# Patient Record
Sex: Female | Born: 1945
Health system: Southern US, Community
[De-identification: ages and names within clinical notes are randomized; demographics above are authoritative.]

## PROBLEM LIST (undated history)

## (undated) DIAGNOSIS — G43909 Migraine, unspecified, not intractable, without status migrainosus: Secondary | ICD-10-CM

## (undated) DIAGNOSIS — Z9889 Other specified postprocedural states: Secondary | ICD-10-CM

## (undated) DIAGNOSIS — R519 Headache, unspecified: Secondary | ICD-10-CM

## (undated) DIAGNOSIS — Z8719 Personal history of other diseases of the digestive system: Secondary | ICD-10-CM

## (undated) DIAGNOSIS — F1190 Opioid use, unspecified, uncomplicated: Secondary | ICD-10-CM

## (undated) DIAGNOSIS — R112 Nausea with vomiting, unspecified: Secondary | ICD-10-CM

## (undated) DIAGNOSIS — I10 Essential (primary) hypertension: Secondary | ICD-10-CM

## (undated) DIAGNOSIS — K219 Gastro-esophageal reflux disease without esophagitis: Secondary | ICD-10-CM

## (undated) DIAGNOSIS — M797 Fibromyalgia: Secondary | ICD-10-CM

## (undated) DIAGNOSIS — F329 Major depressive disorder, single episode, unspecified: Secondary | ICD-10-CM

## (undated) DIAGNOSIS — M810 Age-related osteoporosis without current pathological fracture: Secondary | ICD-10-CM

## (undated) DIAGNOSIS — Z8489 Family history of other specified conditions: Secondary | ICD-10-CM

## (undated) DIAGNOSIS — F419 Anxiety disorder, unspecified: Secondary | ICD-10-CM

## (undated) DIAGNOSIS — F32A Depression, unspecified: Secondary | ICD-10-CM

## (undated) DIAGNOSIS — J45909 Unspecified asthma, uncomplicated: Secondary | ICD-10-CM

## (undated) DIAGNOSIS — R51 Headache: Secondary | ICD-10-CM

## (undated) DIAGNOSIS — J449 Chronic obstructive pulmonary disease, unspecified: Secondary | ICD-10-CM

## (undated) DIAGNOSIS — R0602 Shortness of breath: Secondary | ICD-10-CM

## (undated) HISTORY — DX: Opioid use, unspecified, uncomplicated: F11.90

## (undated) HISTORY — DX: Chronic obstructive pulmonary disease, unspecified: J44.9

---
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
## 1970-05-03 HISTORY — PX: TUBAL LIGATION: SHX77

## 1973-05-03 HISTORY — PX: APPENDECTOMY: SHX54

## 1973-05-03 HISTORY — PX: ABDOMINAL HYSTERECTOMY: SHX81

## 1979-01-02 HISTORY — PX: EXCISIONAL HEMORRHOIDECTOMY: SHX1541

## 1979-01-02 HISTORY — PX: BREAST CYST ASPIRATION: SHX578

## 2006-03-10 ENCOUNTER — Encounter: Payer: Self-pay | Admitting: Family Medicine

## 2006-03-10 ENCOUNTER — Ambulatory Visit: Payer: Self-pay | Admitting: Family Medicine

## 2006-03-10 DIAGNOSIS — M3320 Polymyositis, organ involvement unspecified: Secondary | ICD-10-CM | POA: Insufficient documentation

## 2006-03-10 DIAGNOSIS — Z87891 Personal history of nicotine dependence: Secondary | ICD-10-CM | POA: Insufficient documentation

## 2006-03-10 DIAGNOSIS — F17200 Nicotine dependence, unspecified, uncomplicated: Secondary | ICD-10-CM | POA: Insufficient documentation

## 2006-03-10 DIAGNOSIS — M810 Age-related osteoporosis without current pathological fracture: Secondary | ICD-10-CM | POA: Insufficient documentation

## 2006-03-10 DIAGNOSIS — M797 Fibromyalgia: Secondary | ICD-10-CM | POA: Insufficient documentation

## 2006-03-10 DIAGNOSIS — F411 Generalized anxiety disorder: Secondary | ICD-10-CM | POA: Insufficient documentation

## 2006-03-10 DIAGNOSIS — I10 Essential (primary) hypertension: Secondary | ICD-10-CM | POA: Insufficient documentation

## 2006-08-29 ENCOUNTER — Encounter: Payer: Self-pay | Admitting: Family Medicine

## 2006-09-22 ENCOUNTER — Ambulatory Visit: Payer: Self-pay | Admitting: Family Medicine

## 2007-03-21 ENCOUNTER — Ambulatory Visit: Payer: Self-pay | Admitting: Family Medicine

## 2007-03-21 DIAGNOSIS — G4700 Insomnia, unspecified: Secondary | ICD-10-CM | POA: Insufficient documentation

## 2007-03-23 LAB — CONVERTED CEMR LAB
ALT: 26 units/L (ref 0–35)
AST: 23 units/L (ref 0–37)
Creatinine, Ser: 0.7 mg/dL (ref 0.40–1.20)
Total Bilirubin: 0.6 mg/dL (ref 0.3–1.2)
Vit D, 1,25-Dihydroxy: 32 (ref 30–89)

## 2007-05-05 ENCOUNTER — Encounter: Payer: Self-pay | Admitting: Family Medicine

## 2007-05-19 ENCOUNTER — Ambulatory Visit: Payer: Self-pay | Admitting: *Deleted

## 2007-10-10 ENCOUNTER — Ambulatory Visit: Payer: Self-pay | Admitting: Family Medicine

## 2007-10-10 DIAGNOSIS — K219 Gastro-esophageal reflux disease without esophagitis: Secondary | ICD-10-CM | POA: Insufficient documentation

## 2008-02-13 ENCOUNTER — Ambulatory Visit: Payer: Self-pay | Admitting: Family Medicine

## 2008-02-13 LAB — CONVERTED CEMR LAB
Cholesterol, target level: 200 mg/dL
HDL: 75 mg/dL (ref >39)
LDL Cholesterol: 125 mg/dL — ABNORMAL HIGH (ref 0–99)
Total CHOL/HDL Ratio: 3

## 2008-08-01 ENCOUNTER — Ambulatory Visit: Payer: Self-pay | Admitting: Family Medicine

## 2008-08-02 ENCOUNTER — Encounter: Payer: Self-pay | Admitting: Family Medicine

## 2008-08-12 ENCOUNTER — Ambulatory Visit: Payer: Self-pay | Admitting: Family Medicine

## 2008-09-25 ENCOUNTER — Other Ambulatory Visit: Admission: RE | Admit: 2008-09-25 | Discharge: 2008-09-25 | Payer: Self-pay | Admitting: Family Medicine

## 2008-09-25 ENCOUNTER — Encounter: Payer: Self-pay | Admitting: Family Medicine

## 2008-09-25 ENCOUNTER — Ambulatory Visit: Payer: Self-pay | Admitting: Family Medicine

## 2008-09-25 DIAGNOSIS — H9190 Unspecified hearing loss, unspecified ear: Secondary | ICD-10-CM | POA: Insufficient documentation

## 2008-09-27 LAB — CONVERTED CEMR LAB
Albumin: 4.8 g/dL (ref 3.5–5.2)
Alkaline Phosphatase: 71 units/L (ref 39–117)
BUN: 13 mg/dL (ref 6–23)
CO2: 23 meq/L (ref 19–32)
Calcium: 9.7 mg/dL (ref 8.4–10.5)
Cholesterol: 217 mg/dL — ABNORMAL HIGH (ref 0–200)
Glucose, Bld: 90 mg/dL (ref 70–99)
HDL: 72 mg/dL (ref >39)
LDL Cholesterol: 115 mg/dL — ABNORMAL HIGH (ref 0–99)
Pap Smear: NORMAL
Potassium: 4.7 meq/L (ref 3.5–5.3)
Triglycerides: 151 mg/dL — ABNORMAL HIGH (ref <150)

## 2008-10-15 ENCOUNTER — Encounter: Payer: Self-pay | Admitting: Family Medicine

## 2008-10-21 ENCOUNTER — Encounter: Payer: Self-pay | Admitting: Family Medicine

## 2008-10-22 ENCOUNTER — Encounter: Payer: Self-pay | Admitting: Family Medicine

## 2008-11-25 ENCOUNTER — Ambulatory Visit: Payer: Self-pay | Admitting: Family Medicine

## 2008-11-25 ENCOUNTER — Encounter: Admission: RE | Admit: 2008-11-25 | Discharge: 2008-11-25 | Payer: Self-pay | Admitting: Family Medicine

## 2009-02-13 ENCOUNTER — Ambulatory Visit: Payer: Self-pay | Admitting: Family Medicine

## 2009-02-13 DIAGNOSIS — F329 Major depressive disorder, single episode, unspecified: Secondary | ICD-10-CM | POA: Insufficient documentation

## 2009-02-13 DIAGNOSIS — F32A Depression, unspecified: Secondary | ICD-10-CM | POA: Insufficient documentation

## 2009-02-13 DIAGNOSIS — F330 Major depressive disorder, recurrent, mild: Secondary | ICD-10-CM | POA: Insufficient documentation

## 2009-03-31 ENCOUNTER — Encounter: Payer: Self-pay | Admitting: Family Medicine

## 2009-06-03 ENCOUNTER — Encounter: Payer: Self-pay | Admitting: Family Medicine

## 2009-06-05 ENCOUNTER — Encounter: Payer: Self-pay | Admitting: Family Medicine

## 2009-09-04 ENCOUNTER — Ambulatory Visit: Payer: Self-pay | Admitting: Family Medicine

## 2009-10-03 ENCOUNTER — Encounter: Payer: Self-pay | Admitting: Family Medicine

## 2009-10-03 ENCOUNTER — Ambulatory Visit: Payer: Self-pay | Admitting: Family Medicine

## 2009-10-10 LAB — CONVERTED CEMR LAB
Albumin: 4.8 g/dL (ref 3.5–5.2)
BUN: 16 mg/dL (ref 6–23)
CO2: 25 meq/L (ref 19–32)
Calcium: 9.6 mg/dL (ref 8.4–10.5)
Chloride: 102 meq/L (ref 96–112)
Cholesterol: 205 mg/dL — ABNORMAL HIGH (ref 0–200)
Creatinine, Ser: 0.78 mg/dL (ref 0.40–1.20)
Glucose, Bld: 95 mg/dL (ref 70–99)
HDL: 69 mg/dL (ref >39)
Total CHOL/HDL Ratio: 3
Triglycerides: 128 mg/dL (ref <150)

## 2010-01-16 ENCOUNTER — Ambulatory Visit: Payer: Self-pay | Admitting: Family Medicine

## 2010-05-03 HISTORY — PX: COLONOSCOPY W/ BIOPSIES AND POLYPECTOMY: SHX1376

## 2010-05-21 ENCOUNTER — Ambulatory Visit
Admission: RE | Admit: 2010-05-21 | Discharge: 2010-05-21 | Payer: Self-pay | Source: Home / Self Care | Attending: Family Medicine | Admitting: Family Medicine

## 2010-06-02 NOTE — Procedures (Signed)
Summary: Colonoscopy   Flex Sig Next Due:  Not Indicated Last Colonoscopy:  done (05/04/2007 1:20:44 PM) Colonoscopy Result Date:  06/03/2009 Colonoscopy Result:  normal Colonoscopy Next Due:  10 yr Hemoccult Next Due:  Not Indicated  At [facility] GI with Dr. Marney Setting. February  1, 201112:24 PM Annette Ferger MD, Santina Evans

## 2010-06-02 NOTE — Letter (Signed)
Summary: Depression Questionnaire/[facility]  Depression Questionnaire/[place] [facility]   Imported By: Lanelle Bal 06/09/2009 10:56:10  _____________________________________________________________________  External Attachment:    Type:   Image     Comment:   External Document

## 2010-06-02 NOTE — Assessment & Plan Note (Signed)
Summary: REFILL ON MEDS, cough   Vital Signs:  Patient profile:   65 year old female Height:      62.5 inches Weight:      155 pounds Pulse rate:   64 / minute BP sitting:   132 / 79  (left arm) Cuff size:   regular  Vitals Entered By: Kathlene November (January 16, 2010 9:52 AM) CC: refills. Pt wants printed rx's in hand- doesn't want any sent electronically   Primary Care Provider:  Linford Arnold, C  CC:  refills. Pt wants printed rx's in hand- doesn't want any sent electronically.  History of Present Illness: refills. Pt wants printed rx's in hand- doesn't want any sent electronically. Having problems with her pharmacy so would like them printed out.    Cough since march. Feels like in the back of the throat. Clear mucous occ.  Occ will feel like she is strangling. Smoker but says hasn't smoked lately.  Feels mucous breaking loose from her sinuses but not pain or pressure.  NO fever or sneezing or eye pain or pressure. No ear sxs. NO SOB or CP.  NO worsenign or alleviating sxs.   Current Medications (verified): 1)  Atenolol 100 Mg Tabs (Atenolol) .... Take 1 Tablet By Mouth Two Times A Day 2)  Omeprazole 40 Mg  Cpdr (Omeprazole) .... Take 1 Tablet By Mouth Two Times A Day 3)  Alprazolam 0.25 Mg Tabs (Alprazolam) .... Take 1 Tablet By Mouth Three Times A Day As Needed 4)  Alendronate Sodium 10 Mg  Tabs (Alendronate Sodium) .... Take 1 Tablet By Mouth Once A Day 5)  Multivitamins  Tabs (Multiple Vitamin) .... Take 1 Tablet By Mouth Once A Day 6)  Calcarb 600/d 600-125 Mg-Unit Tabs (Calcium-Vitamin D) .... Take 1 Tablet By Mouth Two Times A Day 7)  Fish Oil 1000 Mg Caps (Omega-3 Fatty Acids) .... Take 1 Tablet By Mouth Once A Day 8)  Seroquel 25 Mg  Tabs (Quetiapine Fumarate) .... Take 1 Tablet By Mouth Once A Day At Bedtime 9)  Vitamin D 1000 Unit Caps (Cholecalciferol) .... Take 1 Tablet By Mouth Once A Day 10)  Pentazocine-Naloxone Hcl 50-0.5 Mg Tabs (Pentazocine-Naloxone) .Marland Kitchen.. 1 Tab By  Mouth Every 6 Hours As Needed 11)  Lyrica 75 Mg Caps (Pregabalin) .... Take 1 Tablet By Mouth Two Times A Day 12)  Celexa 20 Mg Tabs (Citalopram Hydrobromide) .... 1/2 Tab By Mouth Once A Day For 1 Week Increase To Whole Tab A Day. 13)  Lisinopril-Hydrochlorothiazide 20-25 Mg Tabs (Lisinopril-Hydrochlorothiazide) .... Take 1 Tablet By Mouth Once A Day  Allergies (verified): 1)  ! Asa 2)  Codeine 3)  Augmentin (Amoxicillin-Pot Clavulanate)  Comments:  Nurse/Medical Assistant: The patient's medications and allergies were reviewed with the patient and were updated in the Medication and Allergy Lists. Kathlene November (January 16, 2010 9:52 AM)  Social History: Reviewed history from 03/10/2006 and no changes required. Stays at home.   Current Smoker Alcohol use-yes Drug use-no Regular exercise-no  Physical Exam  General:  Well-developed,well-nourished,in no acute distress; alert,appropriate and cooperative throughout examination Head:  Normocephalic and atraumatic without obvious abnormalities. No apparent alopecia or balding. Eyes:  No corneal or conjunctival inflammation noted. EOMI. Perrla. Funduscopic exam benign, without hemorrhages, exudates or papilledema. Vision grossly normal. Ears:  External ear exam shows no significant lesions or deformities.  Otoscopic examination reveals clear canals, tympanic membranes are intact bilaterally without bulging, retraction, inflammation or discharge. Hearing is grossly normal bilaterally. Nose:  External nasal  examination shows no deformity or inflammation.Trubinate are pale and swollen.  Mouth:  Oral mucosa and oropharynx without lesions or exudates.  Teeth in good repair. Neck:  No deformities, masses, or tenderness noted. Lungs:  Normal respiratory effort, chest expands symmetrically. Lungs are clear to auscultation, no crackles or wheezes. Heart:  Normal rate and regular rhythm. S1 and S2 normal without gallop, murmur, click, rub or other  extra sounds. Skin:  no rashes.   Psych:  Cognition and judgment appear intact. Alert and cooperative with normal attention span and concentration. No apparent delusions, illusions, hallucinations   Impression & Recommendations:  Problem # 1:  COUGH (ICD-786.2) Consider ACE cough. will change to ARB. F/U in one mo.  Also having nasal drainage with it so consider allergic rhinitis. Trial of omnaris. Samples given to use for 2 weeks. If helping then let me know Also smoking cessation would be helpful.  If nto better at f/u will get a CXR. also consider spirometry since she is a smoker.   Complete Medication List: 1)  Atenolol 100 Mg Tabs (Atenolol) .... Take 1 tablet by mouth two times a day 2)  Omeprazole 40 Mg Cpdr (Omeprazole) .... Take 1 tablet by mouth two times a day 3)  Alprazolam 0.25 Mg Tabs (Alprazolam) .... Take 1 tablet by mouth three times a day as needed 4)  Alendronate Sodium 10 Mg Tabs (Alendronate sodium) .... Take 1 tablet by mouth once a day 5)  Multivitamins Tabs (Multiple vitamin) .... Take 1 tablet by mouth once a day 6)  Calcarb 600/d 600-125 Mg-unit Tabs (Calcium-vitamin d) .... Take 1 tablet by mouth two times a day 7)  Fish Oil 1000 Mg Caps (Omega-3 fatty acids) .... Take 1 tablet by mouth once a day 8)  Seroquel 25 Mg Tabs (Quetiapine fumarate) .... Take 1 tablet by mouth once a day at bedtime 9)  Vitamin D 1000 Unit Caps (Cholecalciferol) .... Take 1 tablet by mouth once a day 10)  Pentazocine-naloxone Hcl 50-0.5 Mg Tabs (Pentazocine-naloxone) .Marland Kitchen.. 1 tab by mouth every 6 hours as needed 11)  Lyrica 75 Mg Caps (Pregabalin) .... Take 1 tablet by mouth two times a day 12)  Celexa 20 Mg Tabs (Citalopram hydrobromide) .... 1/2 tab by mouth once a day for 1 week increase to whole tab a day. 13)  Losartan Potassium-hctz 100-25 Mg Tabs (Losartan potassium-hctz) .... Take 1 tablet by mouth once a day  Patient Instructions: 1)  Please schedule a follow-up appointment in 1  month for cough if not resolved.  Prescriptions: CELEXA 20 MG TABS (CITALOPRAM HYDROBROMIDE) 1/2 tab by mouth once a day for 1 week increase to whole tab a day.  #90 Tablet x 3   Entered and Authorized by:   Nani Gasser MD   Signed by:   Nani Gasser MD on 01/16/2010   Method used:   Print then Give to Patient   RxID:   [id number] LYRICA 75 MG CAPS (PREGABALIN) Take 1 tablet by mouth two times a day  #180 x 3   Entered and Authorized by:   Nani Gasser MD   Signed by:   Nani Gasser MD on 01/16/2010   Method used:   Print then Give to Patient   RxID:   [id number] SEROQUEL 25 MG  TABS (QUETIAPINE FUMARATE) Take 1 tablet by mouth once a day at bedtime  #90 x 3   Entered and Authorized by:   Nani Gasser MD   Signed by:   Nani Gasser  MD on 01/16/2010   Method used:   Print then Give to Patient   RxID:   [id number] ALENDRONATE SODIUM 10 MG  TABS (ALENDRONATE SODIUM) Take 1 tablet by mouth once a day  #90 x 3   Entered and Authorized by:   Nani Gasser MD   Signed by:   Nani Gasser MD on 01/16/2010   Method used:   Print then Give to Patient   RxID:   [id number] OMEPRAZOLE 40 MG  CPDR (OMEPRAZOLE) Take 1 tablet by mouth two times a day  #180 x 3   Entered and Authorized by:   Nani Gasser MD   Signed by:   Nani Gasser MD on 01/16/2010   Method used:   Print then Give to Patient   RxID:   [id number] ATENOLOL 100 MG TABS (ATENOLOL) Take 1 tablet by mouth two times a day  #180 x 3   Entered and Authorized by:   Nani Gasser MD   Signed by:   Nani Gasser MD on 01/16/2010   Method used:   Print then Give to Patient   RxID:   [id number] LOSARTAN POTASSIUM-HCTZ 100-25 MG TABS (LOSARTAN POTASSIUM-HCTZ) Take 1 tablet by mouth once a day  #90 x 3   Entered and Authorized by:   Nani Gasser MD   Signed by:   Nani Gasser MD on 01/16/2010   Method used:   Print then Give to  Patient   RxID:   [id number]   Appended Document: REFILL ON MEDS, cough    Contraindications/Deferment of Procedures/Staging:    Test/Procedure: FLU VAX    Reason for deferment: patient declined

## 2010-06-02 NOTE — Assessment & Plan Note (Signed)
Summary: BP CHECK//[facility]  Nurse Visit   Vital Signs:  Patient profile:   65 year old female Height:      62.5 inches Pulse rate:   56 / minute BP sitting:   121 / 76  (left arm) Cuff size:   regular  Vitals Entered By: Kathlene November (October 03, 2009 10:53 AM) CC: Follow-up HTN HPI: Taking Meds?yes Side Effects?none Chest Pain, SOB, Dizziness?no A/P: HTN(401.1) At Goal? If no, MD will be notified. Follow-up in--  5 minutes was spent with the pt.  Allergies: 1)  ! Asa 2)  Codeine 3)  Augmentin (Amoxicillin-Pot Clavulanate)  Orders Added: 1)  Est. Patient Level I [34742]

## 2010-06-02 NOTE — Assessment & Plan Note (Signed)
Summary: f/U HTN   Vital Signs:  Patient profile:   65 year old female Height:      62.5 inches Weight:      152 pounds Pulse rate:   69 / minute BP sitting:   139 / 83  (left arm) Cuff size:   regular  Vitals Entered By: Kathlene November (Sep 04, 2009 9:48 AM) CC: followup BP, Hypertension Management   Primary Care Provider:  Linford Arnold, C  CC:  followup BP and Hypertension Management.  History of Present Illness: followup BP, Hypertension Management.    Hypertension History:      She denies headache, chest pain, palpitations, dyspnea with exertion, orthopnea, PND, peripheral edema, visual symptoms, neurologic problems, syncope, and side effects from treatment.  She notes no problems with any antihypertensive medication side effects.        Positive major cardiovascular risk factors include female age 73 years old or older, hypertension, and current tobacco user.  Negative major cardiovascular risk factors include no history of diabetes and negative family history for ischemic heart disease.        Further assessment for target organ damage reveals no history of ASHD, stroke/TIA, or peripheral vascular disease.     Current Medications (verified): 1)  Atenolol 100 Mg Tabs (Atenolol) .... Take 1 Tablet By Mouth Two Times A Day 2)  Omeprazole 40 Mg  Cpdr (Omeprazole) .... Take 1 Tablet By Mouth Two Times A Day 3)  Alprazolam 0.25 Mg Tabs (Alprazolam) .... Take 1 Tablet By Mouth Three Times A Day As Needed 4)  Alendronate Sodium 10 Mg  Tabs (Alendronate Sodium) .... Take 1 Tablet By Mouth Once A Day 5)  Multivitamins  Tabs (Multiple Vitamin) .... Take 1 Tablet By Mouth Once A Day 6)  Calcarb 600/d 600-125 Mg-Unit Tabs (Calcium-Vitamin D) .... Take 1 Tablet By Mouth Two Times A Day 7)  Fish Oil 1000 Mg Caps (Omega-3 Fatty Acids) .... Take 1 Tablet By Mouth Once A Day 8)  Seroquel 25 Mg  Tabs (Quetiapine Fumarate) .... Take 1 Tablet By Mouth Once A Day At Bedtime 9)  Vitamin D 1000 Unit  Caps (Cholecalciferol) .... Take 1 Tablet By Mouth Once A Day 10)  Pentazocine-Naloxone Hcl 50-0.5 Mg Tabs (Pentazocine-Naloxone) .Marland Kitchen.. 1 Tab By Mouth Every 6 Hours As Needed 11)  Hydrochlorothiazide 25 Mg Tabs (Hydrochlorothiazide) .... Take 1 Tablet By Mouth Once A Day 12)  Lyrica 75 Mg Caps (Pregabalin) .... Take 1 Tablet By Mouth Two Times A Day 13)  Celexa 20 Mg Tabs (Citalopram Hydrobromide) .... 1/2 Tab By Mouth Once A Day For 1 Week Increase To Whole Tab A Day. 14)  Zithromax Z-Pak 250 Mg Tabs (Azithromycin) .... Take As Directed.  Allergies (verified): 1)  ! Asa 2)  Codeine 3)  Augmentin (Amoxicillin-Pot Clavulanate)  Comments:  Nurse/Medical Assistant: The patient's medications and allergies were reviewed with the patient and were updated in the Medication and Allergy Lists. Kathlene November (Sep 04, 2009 9:48 AM)  Physical Exam  General:  Well-developed,well-nourished,in no acute distress; alert,appropriate and cooperative throughout examination Lungs:  Normal respiratory effort, chest expands symmetrically. Lungs are clear to auscultation, no crackles or wheezes. Heart:  Normal rate and regular rhythm. S1 and S2 normal without gallop, murmur, click, rub or other extra sounds. No carotid bruits.  Skin:  no rashes.   Psych:  Cognition and judgment appear intact. Alert and cooperative with normal attention span and concentration. No apparent delusions, illusions, hallucinations   Impression &  Recommendations:  Problem # 1:  HYPERTENSION, BENIGN ESSENTIAL (ICD-401.1) Will add lsinopril for better control.  REcheck in 3 months. Come in sooner if BP is down to low or having sxs.  The following medications were removed from the medication list:    Hydrochlorothiazide 25 Mg Tabs (Hydrochlorothiazide) .Marland Kitchen... Take 1 tablet by mouth once a day Her updated medication list for this problem includes:    Atenolol 100 Mg Tabs (Atenolol) .Marland Kitchen... Take 1 tablet by mouth two times a day     Lisinopril-hydrochlorothiazide 20-25 Mg Tabs (Lisinopril-hydrochlorothiazide) .Marland Kitchen... Take 1 tablet by mouth once a day  Orders: T-Comprehensive Metabolic Panel [id number]) T-Lipid Profile ([id number]) Prescription Created Electronically [id number])  Complete Medication List: 1)  Atenolol 100 Mg Tabs (Atenolol) .... Take 1 tablet by mouth two times a day 2)  Omeprazole 40 Mg Cpdr (Omeprazole) .... Take 1 tablet by mouth two times a day 3)  Alprazolam 0.25 Mg Tabs (Alprazolam) .... Take 1 tablet by mouth three times a day as needed 4)  Alendronate Sodium 10 Mg Tabs (Alendronate sodium) .... Take 1 tablet by mouth once a day 5)  Multivitamins Tabs (Multiple vitamin) .... Take 1 tablet by mouth once a day 6)  Calcarb 600/d 600-125 Mg-unit Tabs (Calcium-vitamin d) .... Take 1 tablet by mouth two times a day 7)  Fish Oil 1000 Mg Caps (Omega-3 fatty acids) .... Take 1 tablet by mouth once a day 8)  Seroquel 25 Mg Tabs (Quetiapine fumarate) .... Take 1 tablet by mouth once a day at bedtime 9)  Vitamin D 1000 Unit Caps (Cholecalciferol) .... Take 1 tablet by mouth once a day 10)  Pentazocine-naloxone Hcl 50-0.5 Mg Tabs (Pentazocine-naloxone) .Marland Kitchen.. 1 tab by mouth every 6 hours as needed 11)  Lyrica 75 Mg Caps (Pregabalin) .... Take 1 tablet by mouth two times a day 12)  Celexa 20 Mg Tabs (Citalopram hydrobromide) .... 1/2 tab by mouth once a day for 1 week increase to whole tab a day. 13)  Lisinopril-hydrochlorothiazide 20-25 Mg Tabs (Lisinopril-hydrochlorothiazide) .... Take 1 tablet by mouth once a day  Hypertension Assessment/Plan:      The patient's hypertensive risk group is category B: At least one risk factor (excluding diabetes) with no target organ damage.  Her calculated 10 year risk of coronary heart disease is 9 %.  Today's blood pressure is 139/83.  Her blood pressure goal is < 140/90.  Patient Instructions: 1)  Changed BP med.  New rx sent to your pharmacy 2)  We will call you with your  lab results.  3)  Please schedule a follow-up appointment in 3 months .  Prescriptions: LISINOPRIL-HYDROCHLOROTHIAZIDE 20-25 MG TABS (LISINOPRIL-HYDROCHLOROTHIAZIDE) Take 1 tablet by mouth once a day  #30 x 3   Entered and Authorized by:   Nani Gasser MD   Signed by:   Nani Gasser MD on 09/04/2009   Method used:   Electronically to        [facility]* (retail)       [facility] [place], [place]  [address]       Ph: [phone]       Fax: [phone]   RxID:   [id number]

## 2010-06-02 NOTE — Procedures (Signed)
Summary: Colonoscopy/[facility]  Colonoscopy/[facility]   Imported By: Lanelle Bal 06/13/2009 11:43:11  _____________________________________________________________________  External Attachment:    Type:   Image     Comment:   External Document

## 2010-06-02 NOTE — Letter (Signed)
Summary: Letter to Patient Regarding Colonoscopy Results/[facility]  Letter to Patient Regarding Colonoscopy Results/[facility]   Imported By: Lanelle Bal 06/26/2009 13:46:00  _____________________________________________________________________  External Attachment:    Type:   Image     Comment:   External Document

## 2010-06-04 NOTE — Assessment & Plan Note (Signed)
Summary: Sinusitis/bronchitis   Vital Signs:  Patient profile:   65 year old female Height:      62.5 inches Weight:      157 pounds Pulse rate:   98 / minute BP sitting:   140 / 84  (right arm) Cuff size:   regular  Vitals Entered By: Avon Gully CMA, Duncan Dull) (May 21, 2010 10:39 AM) CC: cough since the end of Dec, hurts throat at times when coughing,pt wants paper rx   Primary Care Provider:  Linford Arnold, C  CC:  cough since the end of Dec, hurts throat at times when coughing, and pt wants paper rx.  History of Present Illness: cough since the end of Dec, hurts throat at times when coughing,pt wants paper rx Cough for 3 weeks, feels tight, in the neck area. No deep.  Sounds wet.  Wheezing at night. Having sinus HA for days at a time and post nasal drip. No fever.  NO ear pain or ST. Felt very nauseated over the weekend. No othe rGI sxs.   Current Medications (verified): 1)  Atenolol 100 Mg Tabs (Atenolol) .... Take 1 Tablet By Mouth Two Times A Day 2)  Omeprazole 40 Mg  Cpdr (Omeprazole) .... Take 1 Tablet By Mouth Two Times A Day 3)  Alprazolam 0.25 Mg Tabs (Alprazolam) .... Take 1 Tablet By Mouth Three Times A Day As Needed 4)  Alendronate Sodium 10 Mg  Tabs (Alendronate Sodium) .... Take 1 Tablet By Mouth Once A Day 5)  Multivitamins  Tabs (Multiple Vitamin) .... Take 1 Tablet By Mouth Once A Day 6)  Calcarb 600/d 600-125 Mg-Unit Tabs (Calcium-Vitamin D) .... Take 1 Tablet By Mouth Two Times A Day 7)  Fish Oil 1000 Mg Caps (Omega-3 Fatty Acids) .... Take 1 Tablet By Mouth Once A Day 8)  Seroquel 25 Mg  Tabs (Quetiapine Fumarate) .... Take 1 Tablet By Mouth Once A Day At Bedtime 9)  Vitamin D 1000 Unit Caps (Cholecalciferol) .... Take 1 Tablet By Mouth Once A Day 10)  Pentazocine-Naloxone Hcl 50-0.5 Mg Tabs (Pentazocine-Naloxone) .Marland Kitchen.. 1 Tab By Mouth Every 6 Hours As Needed 11)  Lyrica 75 Mg Caps (Pregabalin) .... Take 1 Tablet By Mouth Two Times A Day 12)  Celexa 20 Mg  Tabs (Citalopram Hydrobromide) .... 1/2 Tab By Mouth Once A Day For 1 Week Increase To Whole Tab A Day. 13)  Losartan Potassium-Hctz 100-25 Mg Tabs (Losartan Potassium-Hctz) .... Take 1 Tablet By Mouth Once A Day  Allergies (verified): 1)  ! Asa 2)  Codeine 3)  Augmentin (Amoxicillin-Pot Clavulanate)  Comments:  Nurse/Medical Assistant: The patient's medications and allergies were reviewed with the patient and were updated in the Medication and Allergy Lists. Avon Gully CMA, Duncan Dull) (May 21, 2010 10:39 AM)  Social History: Reviewed history from 03/10/2006 and no changes required. Stays at home.   Current Smoker Alcohol use-yes Drug use-no Regular exercise-no  Physical Exam  General:  Well-developed,well-nourished,in no acute distress; alert,appropriate and cooperative throughout examination Head:  Normocephalic and atraumatic without obvious abnormalities. No apparent alopecia or balding. Eyes:  No corneal or conjunctival inflammation noted. EOMI. Perrla. Ears:  External ear exam shows no significant lesions or deformities.  Otoscopic examination reveals clear canals, tympanic membranes are intact bilaterally without bulging, retraction, inflammation or discharge. Hearing is grossly normal bilaterally. Nose:  External nasal examination shows no deformity or inflammation. Mouth:  Oral mucosa and oropharynx without lesions or exudates.  Teeth in good repair. Neck:  No deformities, masses,  or tenderness noted. Lungs:  Normal respiratory effort, chest expands symmetrically. Lungs are clear to auscultation, no crackles or wheezes. Heart:  Normal rate and regular rhythm. S1 and S2 normal without gallop, murmur, click, rub or other extra sounds. Skin:  no rashes.   Cervical Nodes:  No lymphadenopathy noted Psych:  Cognition and judgment appear intact. Alert and cooperative with normal attention span and concentration. No apparent delusions, illusions,  hallucinations   Impression & Recommendations:  Problem # 1:  SINUSITIS - ACUTE-NOS (ICD-461.9)  Sinsutisi/bronchitis.  Discussed coule be viral as we are seeing a similar illness but has been 3 week and not improving and havin sinus sxs so will treat for sinusitis. Cough may still linger for 1-2 weeks. Call if not some better in one week. Can use nasal saline.  She says she is no longer smoking.    Her updated medication list for this problem includes:    Bactrim Ds 800-160 Mg Tabs (Sulfamethoxazole-trimethoprim) .Marland Kitchen... Take 1 tablet by mouth two times a day for 10 days  Complete Medication List: 1)  Atenolol 100 Mg Tabs (Atenolol) .... Take 1 tablet by mouth two times a day 2)  Omeprazole 40 Mg Cpdr (Omeprazole) .... Take 1 tablet by mouth two times a day 3)  Alprazolam 0.25 Mg Tabs (Alprazolam) .... Take 1 tablet by mouth three times a day as needed 4)  Alendronate Sodium 10 Mg Tabs (Alendronate sodium) .... Take 1 tablet by mouth once a day 5)  Multivitamins Tabs (Multiple vitamin) .... Take 1 tablet by mouth once a day 6)  Calcarb 600/d 600-125 Mg-unit Tabs (Calcium-vitamin d) .... Take 1 tablet by mouth two times a day 7)  Fish Oil 1000 Mg Caps (Omega-3 fatty acids) .... Take 1 tablet by mouth once a day 8)  Seroquel 25 Mg Tabs (Quetiapine fumarate) .... Take 1 tablet by mouth once a day at bedtime 9)  Vitamin D 1000 Unit Caps (Cholecalciferol) .... Take 1 tablet by mouth once a day 10)  Pentazocine-naloxone Hcl 50-0.5 Mg Tabs (Pentazocine-naloxone) .Marland Kitchen.. 1 tab by mouth every 6 hours as needed 11)  Lyrica 75 Mg Caps (Pregabalin) .... Take 1 tablet by mouth two times a day 12)  Citalopram Hydrobromide 20 Mg Tabs (Citalopram hydrobromide) .... Take 1 tablet by mouth once a day 13)  Losartan Potassium-hctz 100-25 Mg Tabs (Losartan potassium-hctz) .... Take 1 tablet by mouth once a day 14)  Bactrim Ds 800-160 Mg Tabs (Sulfamethoxazole-trimethoprim) .... Take 1 tablet by mouth two times a  day for 10 days  Patient Instructions: 1)  Call if not better in one week.  2)  Please schedule a follow-up appointment in 1 month to recheck your blood pressure.  Prescriptions: ALPRAZOLAM 0.25 MG TABS (ALPRAZOLAM) Take 1 tablet by mouth three times a day as needed  #90 x 0   Entered and Authorized by:   Nani Gasser MD   Signed by:   Nani Gasser MD on 05/21/2010   Method used:   Print then Give to Patient   RxID:   [id number] CITALOPRAM HYDROBROMIDE 20 MG TABS (CITALOPRAM HYDROBROMIDE) Take 1 tablet by mouth once a day  #90 x 1   Entered and Authorized by:   Nani Gasser MD   Signed by:   Nani Gasser MD on 05/21/2010   Method used:   Print then Give to Patient   RxID:   [id number] LOSARTAN POTASSIUM-HCTZ 100-25 MG TABS (LOSARTAN POTASSIUM-HCTZ) Take 1 tablet by mouth once a day  #90  x 3   Entered and Authorized by:   Nani Gasser MD   Signed by:   Nani Gasser MD on 05/21/2010   Method used:   Print then Give to Patient   RxID:   [id number] LYRICA 75 MG CAPS (PREGABALIN) Take 1 tablet by mouth two times a day  #180 x 3   Entered and Authorized by:   Nani Gasser MD   Signed by:   Nani Gasser MD on 05/21/2010   Method used:   Print then Give to Patient   RxID:   [id number] PENTAZOCINE-NALOXONE HCL 50-0.5 MG TABS (PENTAZOCINE-NALOXONE) 1 tab by mouth every 6 hours as needed  #60 x 1   Entered and Authorized by:   Nani Gasser MD   Signed by:   Nani Gasser MD on 05/21/2010   Method used:   Print then Give to Patient   RxID:   [id number] SEROQUEL 25 MG  TABS (QUETIAPINE FUMARATE) Take 1 tablet by mouth once a day at bedtime  #90 x 3   Entered and Authorized by:   Nani Gasser MD   Signed by:   Nani Gasser MD on 05/21/2010   Method used:   Print then Give to Patient   RxID:   [id number] ALENDRONATE SODIUM 10 MG  TABS (ALENDRONATE SODIUM) Take 1 tablet by mouth once a day   #90 x 3   Entered and Authorized by:   Nani Gasser MD   Signed by:   Nani Gasser MD on 05/21/2010   Method used:   Print then Give to Patient   RxID:   [id number] OMEPRAZOLE 40 MG  CPDR (OMEPRAZOLE) Take 1 tablet by mouth two times a day  #180 x 3   Entered and Authorized by:   Nani Gasser MD   Signed by:   Nani Gasser MD on 05/21/2010   Method used:   Print then Give to Patient   RxID:   [id number] ATENOLOL 100 MG TABS (ATENOLOL) Take 1 tablet by mouth two times a day  #180 x 3   Entered and Authorized by:   Nani Gasser MD   Signed by:   Nani Gasser MD on 05/21/2010   Method used:   Print then Give to Patient   RxID:   [id number] BACTRIM DS 800-160 MG TABS (SULFAMETHOXAZOLE-TRIMETHOPRIM) Take 1 tablet by mouth two times a day for 10 days  #20 x 0   Entered and Authorized by:   Nani Gasser MD   Signed by:   Nani Gasser MD on 05/21/2010   Method used:   Electronically to        [facility] (retail)       [address] [place], [place]  [address]       Ph: [phone]       Fax: [phone]   RxID:   [id number]    Orders Added: 1)  Est. Patient Level III [17510]    Prevention & Chronic Care Immunizations   Influenza vaccine: Historical  (02/01/2008)   Influenza vaccine deferral: patient declined  (01/16/2010)   Influenza vaccine due: 01/31/2009    Tetanus booster: 05/03/2004: Tdap (State)   Tetanus booster due: 05/03/2014    Pneumococcal vaccine: Pneumovax  (09/25/2008)   Pneumococcal vaccine due: None    H. zoster vaccine: Not documented  Colorectal Screening   Hemoccult: Not documented   Hemoccult due: Not Indicated    Colonoscopy: normal  (06/03/2009)   Colonoscopy  due: 06/04/2019  Other Screening   Pap smear: Normal  (09/27/2008)   Pap smear due: 10/2010    Mammogram: Normal  (03/31/2009)   Mammogram due: 04/2010    DXA bone density scan: Not  documented   Smoking status: current  (03/10/2006)   Smoking cessation counseling: yes  (05/19/2007)  Lipids   Total Cholesterol: 205  (10/03/2009)   LDL: 110  (10/03/2009)   LDL Direct: Not documented   HDL: 69  (10/03/2009)   Triglycerides: 128  (10/03/2009)  Hypertension   Last Blood Pressure: 140 / 84  (05/21/2010)   Serum creatinine: 0.78  (10/03/2009)   Serum potassium 4.5  (10/03/2009)  Self-Management Support :    Hypertension self-management support: Not documented

## 2010-07-02 ENCOUNTER — Ambulatory Visit (INDEPENDENT_AMBULATORY_CARE_PROVIDER_SITE_OTHER): Payer: Medicare Other | Admitting: Family Medicine

## 2010-07-02 ENCOUNTER — Encounter: Payer: Self-pay | Admitting: Family Medicine

## 2010-07-02 DIAGNOSIS — J439 Emphysema, unspecified: Secondary | ICD-10-CM | POA: Insufficient documentation

## 2010-07-02 DIAGNOSIS — J449 Chronic obstructive pulmonary disease, unspecified: Secondary | ICD-10-CM | POA: Insufficient documentation

## 2010-07-02 DIAGNOSIS — J019 Acute sinusitis, unspecified: Secondary | ICD-10-CM

## 2010-07-09 NOTE — Assessment & Plan Note (Signed)
Summary: sinusitis, COPD   Vital Signs:  Patient profile:   65 year old female Height:      62.5 inches Weight:      154 pounds O2 Sat:      90 % on Room air Temp:     97.8 degrees F oral Pulse rate:   65 / minute BP sitting:   103 / 67  (right arm) Cuff size:   regular  Vitals Entered By: Avon Gully CMA, (AAMA) (July 02, 2010 1:37 PM)  O2 Flow:  Room air CC: sinus congestion and pressure x 3 weeks, cough, not feeling well   Primary Care Provider:  Linford Arnold, C  CC:  sinus congestion and pressure x 3 weeks, cough, and not feeling well.  History of Present Illness: Seen in mid Jan for sinusitis. Tx with bactrim. She felt much better, about 50%  but still has "something in her throat". Last week started having cough and sinus congestion about 10 days.  Nose has been clear and runny. Has a cuple of fevers this last week as well. Feels like has phlegm stuck in her throat that she really can't clear out.  The wheezing in her throat keeps her up at night.    No formal dx of COPD. Has never had spirometry. Seh has noticed more SOB going up hills.   Current Medications (verified): 1)  Atenolol 100 Mg Tabs (Atenolol) .... Take 1 Tablet By Mouth Two Times A Day 2)  Omeprazole 40 Mg  Cpdr (Omeprazole) .... Take 1 Tablet By Mouth Two Times A Day 3)  Alprazolam 0.25 Mg Tabs (Alprazolam) .... Take 1 Tablet By Mouth Three Times A Day As Needed 4)  Alendronate Sodium 10 Mg  Tabs (Alendronate Sodium) .... Take 1 Tablet By Mouth Once A Day 5)  Multivitamins  Tabs (Multiple Vitamin) .... Take 1 Tablet By Mouth Once A Day 6)  Calcarb 600/d 600-125 Mg-Unit Tabs (Calcium-Vitamin D) .... Take 1 Tablet By Mouth Two Times A Day 7)  Fish Oil 1000 Mg Caps (Omega-3 Fatty Acids) .... Take 1 Tablet By Mouth Once A Day 8)  Seroquel 25 Mg  Tabs (Quetiapine Fumarate) .... Take 1 Tablet By Mouth Once A Day At Bedtime 9)  Vitamin D 1000 Unit Caps (Cholecalciferol) .... Take 1 Tablet By Mouth Once A Day 10)   Pentazocine-Naloxone Hcl 50-0.5 Mg Tabs (Pentazocine-Naloxone) .Marland Kitchen.. 1 Tab By Mouth Every 6 Hours As Needed 11)  Lyrica 75 Mg Caps (Pregabalin) .... Take 1 Tablet By Mouth Two Times A Day 12)  Citalopram Hydrobromide 20 Mg Tabs (Citalopram Hydrobromide) .... Take 1 Tablet By Mouth Once A Day 13)  Losartan Potassium-Hctz 100-25 Mg Tabs (Losartan Potassium-Hctz) .... Take 1 Tablet By Mouth Once A Day 14)  Bactrim Ds 800-160 Mg Tabs (Sulfamethoxazole-Trimethoprim) .... Take 1 Tablet By Mouth Two Times A Day For 10 Days  Allergies (verified): 1)  ! Asa 2)  Codeine 3)  Augmentin (Amoxicillin-Pot Clavulanate)  Comments:  Nurse/Medical Assistant: The patient's medications and allergies were reviewed with the patient and were updated in the Medication and Allergy Lists. Avon Gully CMA, Duncan Dull) (July 02, 2010 1:38 PM)  Past History:  Social History: Last updated: 03/10/2006 Stays at home.   Current Smoker Alcohol use-yes Drug use-no Regular exercise-no  Past Medical History: Spirometry 07/2010: FVC 86%, FEV1 48%, ratio is 45 (severe COPD/Stage III by GOLD criteria  Physical Exam  General:  Well-developed,well-nourished,in no acute distress; alert,appropriate and cooperative throughout examination Head:  Normocephalic and atraumatic without obvious abnormalities. No apparent alopecia or balding. Eyes:  No corneal or conjunctival inflammation noted. EOMI. Perrla Ears:  External ear exam shows no significant lesions or deformities.  Otoscopic examination reveals clear canals, tympanic membranes are intact bilaterally without bulging, retraction, inflammation or discharge. Hearing is grossly normal bilaterally. Nose:  External nasal examination shows no deformity or inflammation.  Mouth:  Oral mucosa and oropharynx without lesions or exudates.  Teeth in good repair. Neck:  No deformities, masses, or tenderness noted. Lungs:  normal respiratory effort.  Dec air movment.  Heart:  Normal  rate and regular rhythm. S1 and S2 normal without gallop, murmur, click, rub or other extra sounds. Skin:  no rashes.   Cervical Nodes:  No lymphadenopathy noted Psych:  Cognition and judgment appear intact. Alert and cooperative with normal attention span and concentration. No apparent delusions, illusions, hallucinations   Impression & Recommendations:  Problem # 1:  SINUSITIS - ACUTE-NOS (ICD-461.9)  The following medications were removed from the medication list:    Bactrim Ds 800-160 Mg Tabs (Sulfamethoxazole-trimethoprim) .Marland Kitchen... Take 1 tablet by mouth two times a day for 10 days Her updated medication list for this problem includes:    Levaquin 750 Mg Tabs (Levofloxacin) .Marland Kitchen... Take 1 tablet by mouth once a day for 5 days    Fluticasone Propionate 50 Mcg/act Susp (Fluticasone propionate) .Marland Kitchen... 2 sprays in each nostril daily  Instructed on treatment. Call if symptoms persist or worsen. complete the abx. will add a nasal steroid spray adn recommend use of nasal saline as well. F/U in 2 weeks to make sure resolved.   Problem # 2:  COPD (ICD-496) Assessment: New  New dx based on spirometry done today in teh office. FVC 86%, FEV148%, ratio 45%. She has severe COPD. We discussed the importance of smoking cessation. Will start her on SABA, LABA, ICS. F/u in 2-3 weeks. Asked her to brin her inhalers with her.  Her updated medication list for this problem includes:    Symbicort 80-4.5 Mcg/act Aero (Budesonide-formoterol fumarate) ..... One puff inhaled two times a day    Proair Hfa 108 (90 Base) Mcg/act Aers (Albuterol sulfate) .Marland Kitchen... 2-4 puffs inhaled as needed shortness of breath or wheezing.  Orders: Spirometry (Pre & Post) [phone]) Albuterol Sulfate Sol 1mg  unit dose (J8119)  Complete Medication List: 1)  Atenolol 100 Mg Tabs (Atenolol) .... Take 1 tablet by mouth two times a day 2)  Omeprazole 40 Mg Cpdr (Omeprazole) .... Take 1 tablet by mouth two times a day 3)  Alprazolam 0.25 Mg  Tabs (Alprazolam) .... Take 1 tablet by mouth three times a day as needed 4)  Alendronate Sodium 10 Mg Tabs (Alendronate sodium) .... Take 1 tablet by mouth once a day 5)  Multivitamins Tabs (Multiple vitamin) .... Take 1 tablet by mouth once a day 6)  Calcarb 600/d 600-125 Mg-unit Tabs (Calcium-vitamin d) .... Take 1 tablet by mouth two times a day 7)  Fish Oil 1000 Mg Caps (Omega-3 fatty acids) .... Take 1 tablet by mouth once a day 8)  Seroquel 25 Mg Tabs (Quetiapine fumarate) .... Take 1 tablet by mouth once a day at bedtime 9)  Vitamin D 1000 Unit Caps (Cholecalciferol) .... Take 1 tablet by mouth once a day 10)  Pentazocine-naloxone Hcl 50-0.5 Mg Tabs (Pentazocine-naloxone) .Marland Kitchen.. 1 tab by mouth every 6 hours as needed 11)  Lyrica 75 Mg Caps (Pregabalin) .... Take 1 tablet by mouth two times a day 12)  Citalopram  Hydrobromide 20 Mg Tabs (Citalopram hydrobromide) .... Take 1 tablet by mouth once a day 13)  Losartan Potassium-hctz 100-25 Mg Tabs (Losartan potassium-hctz) .... Take 1 tablet by mouth once a day 14)  Levaquin 750 Mg Tabs (Levofloxacin) .... Take 1 tablet by mouth once a day for 5 days 15)  Symbicort 80-4.5 Mcg/act Aero (Budesonide-formoterol fumarate) .... One puff inhaled two times a day 16)  Proair Hfa 108 (90 Base) Mcg/act Aers (Albuterol sulfate) .... 2-4 puffs inhaled as needed shortness of breath or wheezing. 17)  Fluticasone Propionate 50 Mcg/act Susp (Fluticasone propionate) .... 2 sprays in each nostril daily  Patient Instructions: 1)  Please schedule a follow-up appointment in 2 weeks for sinuses and COPD.  2)  Use saline nasal rinse two times a day 3)  Start the nasal sprar for your sinuses and complete the antibiotic as well.  4)  Have the pharmacist show you how to use your inhaler called symbicort.  Prescriptions: FLUTICASONE PROPIONATE 50 MCG/ACT SUSP (FLUTICASONE PROPIONATE) 2 sprays in each nostril daily  #1 x 0   Entered and Authorized by:   Nani Gasser  MD   Signed by:   Nani Gasser MD on 07/02/2010   Method used:   Electronically to        [facility]* (retail)       [address]       [place], [place]  [address]       Ph: [phone]       Fax: [phone]   RxID:   [id number] PROAIR HFA 108 (90 BASE) MCG/ACT AERS (ALBUTEROL SULFATE) 2-4 puffs inhaled as needed shortness of breath or wheezing.  #1 x 1   Entered and Authorized by:   Nani Gasser MD   Signed by:   Nani Gasser MD on 07/02/2010   Method used:   Electronically to        [facility]* (retail)       [address]       [place], [place]  [address]       Ph: [phone]       Fax: [phone]   RxID:   [id number] SYMBICORT 80-4.5 MCG/ACT AERO (BUDESONIDE-FORMOTEROL FUMARATE) one puff inhaled two times a day  #1 x 6   Entered and Authorized by:   Nani Gasser MD   Signed by:   Nani Gasser MD on 07/02/2010   Method used:   Electronically to        [facility]* (retail)       [address]       [place], [place]  [address]       Ph: [phone]       Fax: [phone]   RxID:   [id number] LEVAQUIN 750 MG TABS (LEVOFLOXACIN) Take 1 tablet by mouth once a day for 5 days  #5 x 0   Entered and Authorized by:   Nani Gasser MD   Signed by:   Nani Gasser MD on 07/02/2010   Method used:   Electronically to        [facility]* (retail)       [address]       [place], [place]  [address]       Ph: [phone]       Fax: [phone]   RxID:   [id number]    Orders Added: 1)  Est. Patient Level IV [75102] 2)  Spirometry (Pre & Post) [94060] 3)  Albuterol Sulfate Sol 1mg  unit dose [Z6109]

## 2010-07-16 ENCOUNTER — Encounter: Payer: Self-pay | Admitting: Family Medicine

## 2010-07-21 NOTE — Letter (Signed)
Summary: Generic Letter  [facility] [place]  [address]   [place], [place] [address]   Phone: [phone]  Fax: [phone]         07/16/2010  In reagards to :  [facility] [address] [place], [place]  [address]   Ms. Hampe is currently on pentazocine/naloxone which is not currently on the formulat. She currently uses this to treat her fibromyalgia and this current regimen works really well for her.  I do not feel teh alternatives on your formulaty including hydrocodone, methadone, and oxycodone, and morphine are not acceptable.  Please consider approving her medication which is working well for her. She understands taht she will have to pay a higher copay.      Sincerely,      Nani Gasser MD

## 2010-08-13 ENCOUNTER — Other Ambulatory Visit: Payer: Self-pay | Admitting: Family Medicine

## 2010-08-13 MED ORDER — PREGABALIN 75 MG PO CAPS: 75.0000 mg | ORAL_CAPSULE | Freq: Two times a day (BID) | ORAL | Status: DC

## 2010-10-06 ENCOUNTER — Other Ambulatory Visit: Payer: Self-pay | Admitting: Family Medicine

## 2010-11-08 ENCOUNTER — Other Ambulatory Visit: Payer: Self-pay | Admitting: Family Medicine

## 2010-11-09 ENCOUNTER — Other Ambulatory Visit: Payer: Self-pay | Admitting: Family Medicine

## 2010-11-10 ENCOUNTER — Other Ambulatory Visit: Payer: Self-pay | Admitting: Family Medicine

## 2010-11-10 NOTE — Telephone Encounter (Signed)
RF requested for alprazolam 0.25 mg. #90/0 refills was given on 11-08-10.  Refill good til 12-09-10. Jarvis Newcomer, LPN Domingo Dimes

## 2010-11-20 ENCOUNTER — Other Ambulatory Visit: Payer: Self-pay | Admitting: Family Medicine

## 2010-11-21 ENCOUNTER — Other Ambulatory Visit: Payer: Self-pay | Admitting: Family Medicine

## 2010-11-21 MED ORDER — PENTAZOCINE-NALOXONE HCL 50-0.5 MG PO TABS: 1.0000 | ORAL_TABLET | Freq: Four times a day (QID) | ORAL | Status: DC | PRN

## 2010-11-21 NOTE — Telephone Encounter (Signed)
REQ for refill for Talwin generic.  Last OV 07-02-10.  Last refill 10-06-10.  #60/0 refills given today.

## 2010-11-21 NOTE — Telephone Encounter (Signed)
Closed encounter °

## 2010-11-23 ENCOUNTER — Other Ambulatory Visit: Payer: Self-pay | Admitting: Family Medicine

## 2010-11-23 NOTE — Telephone Encounter (Signed)
Closed

## 2011-02-02 ENCOUNTER — Other Ambulatory Visit: Payer: Self-pay | Admitting: Family Medicine

## 2011-02-17 ENCOUNTER — Other Ambulatory Visit: Payer: Self-pay | Admitting: Family Medicine

## 2011-02-22 ENCOUNTER — Other Ambulatory Visit: Payer: Self-pay | Admitting: Family Medicine

## 2011-03-09 ENCOUNTER — Other Ambulatory Visit: Payer: Self-pay | Admitting: Family Medicine

## 2011-04-15 ENCOUNTER — Other Ambulatory Visit: Payer: Self-pay | Admitting: Family Medicine

## 2011-04-19 ENCOUNTER — Other Ambulatory Visit: Payer: Self-pay | Admitting: Family Medicine

## 2011-04-30 ENCOUNTER — Other Ambulatory Visit: Payer: Self-pay | Admitting: Family Medicine

## 2011-05-20 ENCOUNTER — Telehealth: Payer: Self-pay | Admitting: *Deleted

## 2011-05-20 MED ORDER — HYDROCODONE-ACETAMINOPHEN 5-325 MG PO TABS: 2.0000 | ORAL_TABLET | Freq: Two times a day (BID) | ORAL | Status: DC | PRN

## 2011-05-20 NOTE — Telephone Encounter (Signed)
New rx sent

## 2011-05-20 NOTE — Telephone Encounter (Signed)
Pentaz/nalox tab 50-0.5 mg is no longer on formulary. Pt is ok with swithcing to hydrocodone/ibuprofen. [facility]

## 2011-05-24 ENCOUNTER — Other Ambulatory Visit: Payer: Self-pay | Admitting: Family Medicine

## 2011-06-03 ENCOUNTER — Ambulatory Visit (INDEPENDENT_AMBULATORY_CARE_PROVIDER_SITE_OTHER): Payer: Medicare Other | Admitting: Family Medicine

## 2011-06-03 VITALS — BP 125/76 | HR 83 | Wt 149.0 lb

## 2011-06-03 DIAGNOSIS — M5430 Sciatica, unspecified side: Secondary | ICD-10-CM

## 2011-06-03 DIAGNOSIS — M76899 Other specified enthesopathies of unspecified lower limb, excluding foot: Secondary | ICD-10-CM

## 2011-06-03 DIAGNOSIS — M7060 Trochanteric bursitis, unspecified hip: Secondary | ICD-10-CM

## 2011-06-03 DIAGNOSIS — M5432 Sciatica, left side: Secondary | ICD-10-CM

## 2011-06-03 DIAGNOSIS — Z1231 Encounter for screening mammogram for malignant neoplasm of breast: Secondary | ICD-10-CM

## 2011-06-03 MED ORDER — KETOROLAC TROMETHAMINE 60 MG/2ML IM SOLN
60.0000 mg | Freq: Once | INTRAMUSCULAR | Status: AC
  Administered 2011-06-03: 60 mg via INTRAMUSCULAR

## 2011-06-03 MED ORDER — CYCLOBENZAPRINE HCL 10 MG PO TABS: 10.0000 mg | ORAL_TABLET | Freq: Every evening | ORAL | Status: AC | PRN

## 2011-06-03 NOTE — Progress Notes (Signed)
  Subjective:    Patient ID: Annette Ramirez, female    DOB: 27-Jul-1945, 66 y.o.   MRN: [id number]  HPI Low back pain for 2 weeks. Thought it was sciatica initially. If turns over in bed or pivots it is really painful in the left buttock and on her left hip.  Pain radiates down her lateral thigh to her shin to the top of her foot.  Has been taking care of her daughter who is bed-ridden and thinks this may have aggravated her back. She does have a prior history of some back pain. She says it has not felt like this before.Marland Kitchen  Hear some popping a few times in her lower thoracic spine. Taking her usually meds - not relieving her pain. Left leg is very sore. She currently takes Lyrica and Norco. Worse with sitting or standing for prolonged periods. Also worse at night in the bed when she tries to turn over. No significant bleeding symptoms.   Review of Systems     Objective:   Physical Exam  Constitutional: She is oriented to person, place, and time. She appears well-developed and well-nourished.  HENT:  Head: Normocephalic and atraumatic.  Musculoskeletal:       Normal lumbar flexion, and extension. She does have discomfort with both ranges. Normal rotation right and left and normal sidebend. She has a positive straight-leg raise on the left. She is tender over the left buttock and mildly over the left SI joint. Hip, knee, ankle strength is 5 over 5 bilaterally. She did have significant pain with flexion of the foot and hip on the left. She is also very tender over the left greater trochanter bursa.  Neurological: She is alert and oriented to person, place, and time.  Psychiatric: She has a normal mood and affect. Her behavior is normal.          Assessment & Plan:  Left sciatica -new problem. discussed treatment with anti-inflammatories. I recommended she take them with food and water to avoid GI upset. In addition will add a muscle relaxer, Flexeril, at bedtime. Hopefully this will help her  sleep since her pain is causing significant discomfort that she is only getting 2-3 hours of rest at night. She was also given a Toradol injection here in the office for acute pain relief. She already has a prescription for Norco. I would like to get her into physical therapy as soon as possible so we will call him for this referral. Followup in 3-4 weeks to make sure that she is improving with conservative therapy.  Trochanteric bursitis, left-new problem. discussed injection for pain relief today. She says she's had these in the past and did well with them. Patient agreed to a repeat injection today. Trochanteric bursa with Injection Procedure Note  Pre-operative Diagnosis: left trochanteric bursitis  Post-operative Diagnosis: same  Indications: Pain relief  Anesthesia: Lidocaine 1% without epinephrine without added sodium bicarbonate  Procedure Details   Verbal consent was obtained for the procedure. The joint was prepped with Betadine. A needle was inserted into the left trochanteric bursa. 9 ml 1% lidocaine and 1 ml of triamcinolone (KENALOG) 40mg /ml was then injected into the joint. The needle was removed and the area cleansed and dressed.  Complications:  None; patient tolerated the procedure well.

## 2011-06-03 NOTE — Patient Instructions (Addendum)
Aleve twice a day . Take with food and water to avoid stomach upset We will call you with the PT referral.  Use the flexeril at bedtime.   Sciatica Sciatica is a weakness and/or changes in sensation (tingling, jolts, hot and cold, numbness) along the path the sciatic nerve travels. Irritation or damage to lumbar nerve roots is often also referred to as lumbar radiculopathy.   Lumbar radiculopathy (Sciatica) is the most common form of this problem. Radiculopathy can occur in any of the nerves coming out of the spinal cord. The problems caused depend on which nerves are involved. The sciatic nerve is the large nerve supplying the branches of nerves going from the hip to the toes. It often causes a numbness or weakness in the skin and/or muscles that the sciatic nerve serves. It also may cause symptoms (problems) of pain, burning, tingling, or electric shock-like feelings in the path of this nerve. This usually comes from injury to the fibers that make up the sciatic nerve. Some of these symptoms are low back pain and/or unpleasant feelings in the following areas:  From the mid-buttock down the back of the leg to the back of the knee.     And/or the outside of the calf and top of the foot.     And/or behind the inner ankle to the sole of the foot.  CAUSES    Herniated or slipped disc. Discs are the little cushions between the bones in the back.     Pressure by the piriformis muscle in the buttock on the sciatic nerve (Piriformis Syndrome).     Misalignment of the bones in the lower back and buttocks (Sacroiliac Joint Derangement).     Narrowing of the spinal canal that puts pressure on or pinches the fibers that make up the sciatic nerve.     A slipped vertebra that is out of line with those above or beneath it.     Abnormality of the nervous system itself so that nerve fibers do not transmit signals properly, especially to feet and calves (neuropathy).     Tumor (this is rare).  Your  caregiver can usually determine the cause of your sciatica and begin the treatment most likely to help you. TREATMENT   Taking over-the-counter painkillers, physical therapy, rest, exercise, spinal manipulation, and injections of anesthetics and/or steroids may be used. Surgery, acupuncture, and Yoga can also be effective. Mind over matter techniques, mental imagery, and changing factors such as your bed, chair, desk height, posture, and activities are other treatments that may be helpful. You and your caregiver can help determine what is best for you. With proper diagnosis, the cause of most sciatica can be identified and removed. Communication and cooperation between your caregiver and you is essential. If you are not successful immediately, do not be discouraged. With time, a proper treatment can be found that will make you comfortable. HOME CARE INSTRUCTIONS    If the pain is coming from a problem in the back, applying ice to that area for 15 to 20 minutes, 3 to 4 times per day while awake, may be helpful. Put the ice in a plastic bag. Place a towel between the bag of ice and your skin.     You may exercise or perform your usual activities if these do not aggravate your pain, or as suggested by your caregiver.     Only take over-the-counter or prescription medicines for pain, discomfort, or fever as directed by your caregiver.  If your caregiver has given you a follow-up appointment, it is very important to keep that appointment. Not keeping the appointment could result in a chronic or permanent injury, pain, and disability. If there is any problem keeping the appointment, you must call back to this facility for assistance.  SEEK IMMEDIATE MEDICAL CARE IF:    You experience loss of control of bowel or bladder.     You have increasing weakness in the trunk, buttocks, or legs.     There is numbness in any areas from the hip down to the toes.     You have difficulty walking or keeping your  balance.     You have any of the above, with fever or forceful vomiting.  Document Released: 04/13/2001 Document Revised: 12/30/2010 Document Reviewed: 12/01/2007 [facility] Patient Information 2012 [place], [place].

## 2011-06-11 ENCOUNTER — Other Ambulatory Visit: Payer: Self-pay | Admitting: *Deleted

## 2011-06-15 ENCOUNTER — Other Ambulatory Visit: Payer: Self-pay | Admitting: *Deleted

## 2011-06-17 ENCOUNTER — Other Ambulatory Visit: Payer: Self-pay | Admitting: Family Medicine

## 2011-07-05 ENCOUNTER — Telehealth: Payer: Self-pay | Admitting: Family Medicine

## 2011-07-05 ENCOUNTER — Ambulatory Visit (INDEPENDENT_AMBULATORY_CARE_PROVIDER_SITE_OTHER): Payer: Medicare Other | Admitting: Family Medicine

## 2011-07-05 ENCOUNTER — Encounter: Payer: Self-pay | Admitting: Family Medicine

## 2011-07-05 VITALS — BP 129/76 | HR 68 | Temp 98.4°F | Ht 62.5 in | Wt 150.0 lb

## 2011-07-05 DIAGNOSIS — J40 Bronchitis, not specified as acute or chronic: Secondary | ICD-10-CM

## 2011-07-05 DIAGNOSIS — J441 Chronic obstructive pulmonary disease with (acute) exacerbation: Secondary | ICD-10-CM

## 2011-07-05 DIAGNOSIS — J329 Chronic sinusitis, unspecified: Secondary | ICD-10-CM

## 2011-07-05 DIAGNOSIS — J449 Chronic obstructive pulmonary disease, unspecified: Secondary | ICD-10-CM

## 2011-07-05 MED ORDER — ALBUTEROL SULFATE HFA 108 (90 BASE) MCG/ACT IN AERS: 2.0000 | INHALATION_SPRAY | Freq: Four times a day (QID) | RESPIRATORY_TRACT | Status: DC | PRN

## 2011-07-05 MED ORDER — PREDNISONE 20 MG PO TABS: ORAL_TABLET | ORAL | Status: DC

## 2011-07-05 MED ORDER — ALBUTEROL SULFATE (2.5 MG/3ML) 0.083% IN NEBU
2.5000 mg | INHALATION_SOLUTION | Freq: Four times a day (QID) | RESPIRATORY_TRACT | Status: DC | PRN
  Administered 2011-07-05: 2.5 mg via RESPIRATORY_TRACT

## 2011-07-05 MED ORDER — SULFAMETHOXAZOLE-TRIMETHOPRIM 800-160 MG PO TABS: 1.0000 | ORAL_TABLET | Freq: Two times a day (BID) | ORAL | Status: AC

## 2011-07-05 NOTE — Patient Instructions (Signed)
Chronic Obstructive Pulmonary Disease Exacerbation Chronic obstructive pulmonary disease (COPD) is a condition that limits airflow. COPD may include chronic bronchitis, pulmonary emphysema, or both. A COPD exacerbation means that your COPD has gotten worse. Without treatment, this can be a life-threatening problem. COPD exacerbation requires immediate medical care. CAUSES  COPD exacerbation can be caused by:  Exposure to smoke.   Exposure to air pollution, chemical fumes, or dust.   Respiratory infections.   Genetics, particularly alpha 1-antitrypsin deficiency.   A condition in which the body's immune system attacks itself (autoimmunity).  SYMPTOMS   Increased coughing.   Increased wheezing.   Increased shortness of breath.   Swelling due to a buildup of fluid (peripheral edema) related to heart strain.   Rapid breathing.   Chest enlargement (barrel chest).   Chest tightness.  DIAGNOSIS  There is no single test that can diagnosis COPD exacerbation. Your history, physical exam, and other tests will help your caregiver make a diagnosis. Tests may include a chest X-ray, pulmonary function tests, spirometry, basic lab tests, and an arterial blood gas test. TREATMENT  Severe problems may require a stay in the hospital. Depending on the cause of your problems, the following may be prescribed:  Antibiotic medicines.   Bronchodilators (inhaled or tablets).   Cortisone medicines (inhaled or tablets).   Supplemental oxygen therapy.   Pulmonary rehabilitation. This is a broad program that may involve exercise, nutrition counseling, breathing techniques, and further education about your condition.  It is important to use good technique with inhaled medicines. Spacer devices may be needed to help improve drug delivery. HOME CARE INSTRUCTIONS   Do not smoke. Quitting smoking is very important to prevent worsening of COPD.   Avoid exposure to all substances that irritate the airway,  especially tobacco smoke.   If prescribed, take your antibiotics as directed. Finish them even if you start to feel better.   Only take over-the-counter or prescription medicines as directed by your caregiver.   Drink enough fluids to keep your urine clear or pale yellow. This can help thin bronchial secretions.   Use a cool mist vaporizer. This makes it easier to clear your chest when you cough.   If you have a home nebulizer and oxygen, continue to use them as directed.   Maintain all necessary vaccinations to prevent infections.   Exercise regularly.   Eat a healthy diet.   Keep all follow-up appointments as directed by your caregiver.  SEEK IMMEDIATE MEDICAL CARE IF:  You have extreme shortness of breath.   You have severe chest pain or blood in your sputum.   You have a high fever, weakness, repeated vomiting, or fainting.   You feel confused.  MAKE SURE YOU:   Understand these instructions.   Will watch your condition.   Will get help right away if you are not doing well or get worse.  Document Released: 02/14/2007 Document Revised: 04/08/2011 Document Reviewed: 12/15/2010 [employer] Patient Information 2012 [employer]. 

## 2011-07-05 NOTE — Telephone Encounter (Signed)
WE can still schedule her for PT for her sciatica since she is not getting any better.

## 2011-07-05 NOTE — Progress Notes (Signed)
  Subjective:    Patient ID: Annette Ramirez, female    DOB: Sep 11, 1945, 66 y.o.   MRN: [id number]  HPI Cough and chest tightness for 5 days. Low grade fever. Cough productive with green dis charge. Also green nasal discharge.  + smoker. No new meds.  Hx of COPD    Review of Systems     Objective:   Physical Exam  Constitutional: She is oriented to person, place, and time. She appears well-developed and well-nourished.  HENT:  Head: Normocephalic and atraumatic.  Right Ear: External ear normal.  Left Ear: External ear normal.  Nose: Nose normal.  Mouth/Throat: Oropharynx is clear and moist.       TMs and canals are clear.   Eyes: Conjunctivae and EOM are normal. Pupils are equal, round, and reactive to light.  Neck: Neck supple. No thyromegaly present.  Cardiovascular: Normal rate, regular rhythm and normal heart sounds.   Pulmonary/Chest: Effort normal and breath sounds normal. She has no wheezes.  Lymphadenopathy:    She has no cervical adenopathy.  Neurological: She is alert and oriented to person, place, and time.  Skin: Skin is warm and dry.  Psychiatric: She has a normal mood and affect.    No tachypnea. She is able to speak without having to pause for breath.      Assessment & Plan:  Sinusitis/bronchitis,competent a COPD exacerbation. I went her on Bactrim one twice a day x10 days. We gave her Depo-Medrol 80 mg IM here in the office today. Also send her for a ten-day course of prednisone. Followup in one week to make sure she is improving. I like to repeat peak flows at that time. I would really like to get baseline PFTs on her so we can keep a better I on what her actual baseline is. She was in the red zone here but she is not tachypneic and she is able to speak without shortness of breath so I did let her go home. She is to call or go to UC if she suddenly gets worse.   I also encouraged her to schedule a Medicare annual wellness exam, so that we can cover her  preventative care and her up-to-date.

## 2011-07-06 NOTE — Telephone Encounter (Signed)
Annette Ramirez can you reschedule her. Order was originally placed but she never went.  Can you call them again and see if they can schedule her for next week.

## 2011-07-06 NOTE — Telephone Encounter (Signed)
Pt states that she would like to try PT but needs to start it next week due to her being out of town this week for a wedding.

## 2011-07-12 ENCOUNTER — Ambulatory Visit (INDEPENDENT_AMBULATORY_CARE_PROVIDER_SITE_OTHER): Payer: Medicare Other | Admitting: Family Medicine

## 2011-07-12 ENCOUNTER — Encounter: Payer: Self-pay | Admitting: Family Medicine

## 2011-07-12 VITALS — BP 136/71 | HR 66 | Ht 62.5 in | Wt 152.0 lb

## 2011-07-12 DIAGNOSIS — J441 Chronic obstructive pulmonary disease with (acute) exacerbation: Secondary | ICD-10-CM

## 2011-07-12 DIAGNOSIS — J209 Acute bronchitis, unspecified: Secondary | ICD-10-CM

## 2011-07-12 MED ORDER — AMBULATORY NON FORMULARY MEDICATION: Status: DC

## 2011-07-12 NOTE — Progress Notes (Signed)
  Subjective:    Patient ID: Annette Ramirez, female    DOB: [birth year], 66 y.o.   MRN: [id number]  HPI  COPD exacerbation f/u - Taking heer Bactrim. Still has a copule of days left.  Finished the steroid today.  Says is much better. Still some chest congestoin in AM but better during the day. Home peak flow is 250.  Wheezing. Back to as needed on the albuturol.  Last used it during the nightime.    Review of Systems     Objective:   Physical Exam  Constitutional: She is oriented to person, place, and time. She appears well-developed and well-nourished.  HENT:  Head: Normocephalic and atraumatic.  Cardiovascular: Normal rate, regular rhythm and normal heart sounds.   Pulmonary/Chest: Effort normal and breath sounds normal.  Neurological: She is alert and oriented to person, place, and time.  Skin: Skin is warm and dry.  Psychiatric: She has a normal mood and affect. Her behavior is normal.          Assessment & Plan:  COPD/bronchitis, acute - Much better. Home peak flow is 250. Discussed need to schedule serology when she is feeling better. I do not have a baseline on her. I suspect she is probably more moderate COPD in which case she needs an agent such as Spiriva for controller. She does tend to get bronchitis couple of times each winter. I encouraged her to continue to use her albuterol as needed and to complete her antibiotic call. If she feels she is getting worse  Come back.   We also discussed the importance of getting she was dancing. She was given a prescription today I recommended some locations in the local area.

## 2011-07-12 NOTE — Patient Instructions (Signed)
Once your are better please schedule spirometry here in the office.

## 2011-07-21 ENCOUNTER — Other Ambulatory Visit: Payer: Self-pay | Admitting: *Deleted

## 2011-07-21 MED ORDER — CITALOPRAM HYDROBROMIDE 20 MG PO TABS: 20.0000 mg | ORAL_TABLET | Freq: Every day | ORAL | Status: DC

## 2011-08-04 ENCOUNTER — Other Ambulatory Visit: Payer: Self-pay | Admitting: Family Medicine

## 2011-08-09 ENCOUNTER — Other Ambulatory Visit: Payer: Self-pay | Admitting: *Deleted

## 2011-08-09 MED ORDER — HYDROCODONE-ACETAMINOPHEN 5-325 MG PO TABS: 2.0000 | ORAL_TABLET | Freq: Two times a day (BID) | ORAL | Status: DC | PRN

## 2011-08-18 ENCOUNTER — Encounter: Payer: Self-pay | Admitting: Family Medicine

## 2011-08-18 ENCOUNTER — Telehealth: Payer: Self-pay | Admitting: *Deleted

## 2011-09-01 ENCOUNTER — Other Ambulatory Visit: Payer: Self-pay | Admitting: Family Medicine

## 2011-09-01 NOTE — Telephone Encounter (Signed)
Denied Norco. Did not fax this to pharm. Not due until 5/8

## 2011-09-06 ENCOUNTER — Other Ambulatory Visit: Payer: Self-pay | Admitting: Family Medicine

## 2011-10-15 ENCOUNTER — Other Ambulatory Visit: Payer: Self-pay | Admitting: Family Medicine

## 2011-11-02 ENCOUNTER — Other Ambulatory Visit: Payer: Self-pay | Admitting: Family Medicine

## 2011-11-05 ENCOUNTER — Other Ambulatory Visit: Payer: Self-pay | Admitting: Family Medicine

## 2011-11-08 ENCOUNTER — Other Ambulatory Visit: Payer: Self-pay | Admitting: Family Medicine

## 2011-11-26 ENCOUNTER — Other Ambulatory Visit: Payer: Self-pay | Admitting: Family Medicine

## 2011-12-21 ENCOUNTER — Other Ambulatory Visit: Payer: Self-pay | Admitting: Family Medicine

## 2012-01-14 ENCOUNTER — Encounter: Payer: Self-pay | Admitting: Family Medicine

## 2012-01-14 ENCOUNTER — Ambulatory Visit (INDEPENDENT_AMBULATORY_CARE_PROVIDER_SITE_OTHER): Payer: Medicare Other | Admitting: Family Medicine

## 2012-01-14 ENCOUNTER — Other Ambulatory Visit: Payer: Self-pay | Admitting: Family Medicine

## 2012-01-14 VITALS — BP 130/77 | HR 63 | Wt 152.0 lb

## 2012-01-14 DIAGNOSIS — L821 Other seborrheic keratosis: Secondary | ICD-10-CM

## 2012-01-14 DIAGNOSIS — D489 Neoplasm of uncertain behavior, unspecified: Secondary | ICD-10-CM

## 2012-01-14 DIAGNOSIS — L820 Inflamed seborrheic keratosis: Secondary | ICD-10-CM

## 2012-01-14 NOTE — Progress Notes (Signed)
  Subjective:    Patient ID: Annette Ramirez, female    DOB: 17-Feb-1946, 66 y.o.   MRN: [id number]  HPI    Review of Systems     Objective:   Physical Exam        Assessment & Plan:  Shave Biopsy Procedure Note  Pre-operative Diagnosis: Suspicious lesion  Post-operative Diagnosis: same  Locations:right jaw  Indications: rapid growth. Has only been there a few months and getting larger more quickly  Anesthesia: Lidocaine 1% with epinephrine without added sodium bicarbonate  Procedure Details  History of allergy to iodine: no  Patient informed of the risks (including bleeding and infection) and benefits of the  procedure and Verbal informed consent obtained.  The lesion and surrounding area were given a sterile prep using betadyne and draped in the usual sterile fashion. A scalpel was used to shave an area of skin approximately 0.5 cm by 0.5 cm.  Hemostasis achieved with alumuninum chloride. Antibiotic ointment and a sterile dressing applied.  The specimen was sent for pathologic examination. The patient tolerated the procedure well.  EBL: 0 ml  Findings:   Condition: Stable  Complications: none.  Plan: 1. Instructed to keep the wound dry and covered for 24-48h and clean thereafter. 2. Warning signs of infection were reviewed.   3. Recommended that the patient use OTC acetaminophen as needed for pain.  4. Return prn.   Cryotherapy Procedure Note  Pre-operative Diagnosis: seborrheic keratosis, 10 lesion treated.   Post-operative Diagnosis: same  Locations: back upper body  Indications: irritated, several under her bra an dbra straps that are easiliy irritated   Anesthesia: not required   Procedure Details  Patient informed of risks (permanent scarring, infection, light or dark discoloration, bleeding, infection, weakness, numbness and recurrence of the lesion) and benefits of the procedure and verbal informed consent obtained.  The areas are treated  with liquid nitrogen therapy, frozen until ice ball extended 2 mm beyond lesion, allowed to thaw, and treated again. The patient tolerated procedure well.  The patient was instructed on post-op care, warned that there may be blister formation, redness and pain. Recommend OTC analgesia as needed for pain.  Condition: Stable  Complications: none.  Plan: 1. Instructed to keep the area dry and covered for 24-48h and clean thereafter. 2. Warning signs of infection were reviewed.   3. Recommended that the patient use OTC acetaminophen as needed for pain.  4. Return prn

## 2012-02-02 ENCOUNTER — Other Ambulatory Visit: Payer: Self-pay | Admitting: Family Medicine

## 2012-02-15 ENCOUNTER — Other Ambulatory Visit: Payer: Self-pay | Admitting: *Deleted

## 2012-02-15 MED ORDER — ALPRAZOLAM 0.25 MG PO TABS: 0.2500 mg | ORAL_TABLET | Freq: Three times a day (TID) | ORAL | Status: DC | PRN

## 2012-03-07 ENCOUNTER — Other Ambulatory Visit: Payer: Self-pay | Admitting: Family Medicine

## 2012-03-08 ENCOUNTER — Other Ambulatory Visit: Payer: Self-pay | Admitting: Family Medicine

## 2012-03-22 ENCOUNTER — Ambulatory Visit (INDEPENDENT_AMBULATORY_CARE_PROVIDER_SITE_OTHER): Payer: Medicare Other | Admitting: Family Medicine

## 2012-03-22 ENCOUNTER — Encounter: Payer: Self-pay | Admitting: Family Medicine

## 2012-03-22 VITALS — BP 140/78 | HR 61 | Temp 97.5°F | Ht 62.5 in | Wt 152.0 lb

## 2012-03-22 DIAGNOSIS — Z298 Encounter for other specified prophylactic measures: Secondary | ICD-10-CM

## 2012-03-22 DIAGNOSIS — K115 Sialolithiasis: Secondary | ICD-10-CM

## 2012-03-22 DIAGNOSIS — Z23 Encounter for immunization: Secondary | ICD-10-CM

## 2012-03-22 NOTE — Progress Notes (Signed)
CC: Annette Ramirez is a 66 y.o. female is here for swelling on the left side of face and neck   Subjective: HPI:  Patient reports on Saturday evening she had pressure and pain on the left side of her face soon after eating. Pressure was a discomfort moderate in severity. Swelling with substantial of to where she believes her ear canal was closed and even others remarked that she seemed to have a well-defined lump just anterior to her ear. This lasted a matter of hours and resolved without intervention. Again after dinner it recurred. The pain did not radiate nor was it influenced by movement of the neck, swallowing, nor jaw. The above presentation was associated with what she believes was a lymph node swelling and tenderness in the lower anterior lateral left neck. This presentation has waxed and waned since the weekend and seems to be worse after eating, she's been avoiding eating and this seems to prevent its recurrence. She had a full breakfast today with without any of the above symptoms. She reports a foul taste in her mouth last night but she's never experienced before and is no longer present. She reports chronic dry mouth that has not changed recently. She denies (recently nor remotely) fevers, chills, rapid heartbeat, flushing, dental pain, facial pain (other than above) decreased hearing, ringing in ears, fullness of the ears, neck pain, trauma to the face, nor skin changes overlying the site of swelling   Review Of Systems Outlined In HPI  Past Medical History  Diagnosis Date  . COPD (chronic obstructive pulmonary disease)      Family History  Problem Relation Age of Onset  . Alzheimer's disease Mother   . Prostate cancer      grandfather  . Stroke      grandparent     History  Substance Use Topics  . Smoking status: Current Every Day Smoker    Types: Cigarettes  . Smokeless tobacco: Not on file  . Alcohol Use: Yes     Objective: Filed Vitals:   03/22/12 1144  BP:  140/78  Pulse: 61  Temp: 97.5 F (36.4 C)    General: Alert and Oriented, No Acute Distress HEENT: Pupils equal, round, reactive to light. Conjunctivae clear.  External ears unremarkable, canals clear with intact TMs with appropriate landmarks.  Middle ear appears open without effusion. Pink inferior turbinates.  Moist mucous membranes, pharynx without inflammation nor lesions.  Neck supple without palpable lymphadenopathy nor abnormal masses. Stensen's duct on the left is slightly erythematous and mildly enlarged relative to the right both of which are nontender. I am unable to milk anything out of either duct. The region of the parotid gland is not tender bilaterally. Lungs: Clear to auscultation bilaterally, no wheezing/ronchi/rales.  Comfortable work of breathing. Good air movement. Cardiac: Regular rate and rhythm. Normal S1/S2.  No murmurs, rubs, nor gallops.  No carotid bruits Skin: Warm and dry.  Assessment & Plan: Annette Ramirez was seen today for swelling on the left side of face and neck.  Diagnoses and associated orders for this visit:  Salivary duct calculi  Need for prophylactic immunotherapy - Flu vaccine greater than or equal to 3yo with preservative IM    I believe she most likely had a salivary gland stone that has passed, very low suspicion for a parotid gland infection. I have asked her to let me know over the phone or in person if any of her symptoms return and she agrees that it be wise to  get a CT scan. For the time being I've asked her to continue with her regular diet but to increase her fluid intake and suck on sour candies to the next few days.  Return if symptoms worsen or fail to improve.

## 2012-04-17 ENCOUNTER — Other Ambulatory Visit: Payer: Self-pay | Admitting: Family Medicine

## 2012-04-17 NOTE — Telephone Encounter (Signed)
Ok to fill 

## 2012-04-18 ENCOUNTER — Telehealth: Payer: Self-pay

## 2012-04-18 MED ORDER — HYDROCODONE-ACETAMINOPHEN 5-325 MG PO TABS: 2.0000 | ORAL_TABLET | Freq: Two times a day (BID) | ORAL | Status: DC | PRN

## 2012-04-18 NOTE — Telephone Encounter (Signed)
Annette Ramirez called to inquire about her hydrocodone. She would like the hydrocodone prescription faxed sent today.

## 2012-04-19 ENCOUNTER — Other Ambulatory Visit: Payer: Self-pay | Admitting: *Deleted

## 2012-05-04 ENCOUNTER — Other Ambulatory Visit: Payer: Self-pay | Admitting: *Deleted

## 2012-05-04 ENCOUNTER — Other Ambulatory Visit: Payer: Self-pay | Admitting: Family Medicine

## 2012-05-04 MED ORDER — PENTAZOCINE-NALOXONE 50-0.5 MG PO TABS: 1.0000 | ORAL_TABLET | Freq: Four times a day (QID) | ORAL | Status: DC | PRN

## 2012-05-12 ENCOUNTER — Other Ambulatory Visit: Payer: Self-pay | Admitting: Family Medicine

## 2012-05-15 ENCOUNTER — Other Ambulatory Visit: Payer: Self-pay | Admitting: Family Medicine

## 2012-06-29 ENCOUNTER — Other Ambulatory Visit: Payer: Self-pay | Admitting: Family Medicine

## 2012-06-30 ENCOUNTER — Other Ambulatory Visit: Payer: Self-pay | Admitting: *Deleted

## 2012-06-30 MED ORDER — HYDROCODONE-ACETAMINOPHEN 5-325 MG PO TABS: 2.0000 | ORAL_TABLET | Freq: Two times a day (BID) | ORAL | Status: DC | PRN

## 2012-06-30 NOTE — Telephone Encounter (Signed)
Med refilled.

## 2012-07-05 ENCOUNTER — Encounter: Payer: Self-pay | Admitting: Family Medicine

## 2012-07-05 ENCOUNTER — Ambulatory Visit (INDEPENDENT_AMBULATORY_CARE_PROVIDER_SITE_OTHER): Payer: Medicare Other | Admitting: Family Medicine

## 2012-07-05 VITALS — BP 125/77 | HR 86 | Wt 151.0 lb

## 2012-07-05 DIAGNOSIS — M5430 Sciatica, unspecified side: Secondary | ICD-10-CM

## 2012-07-05 DIAGNOSIS — M5432 Sciatica, left side: Secondary | ICD-10-CM

## 2012-07-05 MED ORDER — PREDNISONE 20 MG PO TABS: ORAL_TABLET | ORAL | Status: AC

## 2012-07-05 MED ORDER — HYDROCODONE-ACETAMINOPHEN 5-325 MG PO TABS: 2.0000 | ORAL_TABLET | Freq: Two times a day (BID) | ORAL | Status: DC | PRN

## 2012-07-05 NOTE — Progress Notes (Signed)
CC: Annette Ramirez is a 67 y.o. female is here for left leg pain   Subjective: HPI:  Patient complains of one year of left leg pain. Over the past 2 days it has gotten significantly worse from a typically mild to now severe severity. It is described as a burning that originates in her left buttock and runs down the entirety of her left leg into the bottom of her foot. It is worse with sitting on the left buttock or with lying down flat, it is improved with standing up straight. She believes there is some numbness in her left leg but she is unable to localize it. She denies weakness in the left leg. She denies recent trauma or overexertion. She denies midline back pain, saddle paresthesia, nor bowel or bladder incontinence. She denies knee pain nor groin pain.    Review Of Systems Outlined In HPI  Past Medical History  Diagnosis Date  . COPD (chronic obstructive pulmonary disease)      Family History  Problem Relation Age of Onset  . Alzheimer's disease Mother   . Prostate cancer      grandfather  . Stroke      grandparent     History  Substance Use Topics  . Smoking status: Current Every Day Smoker    Types: Cigarettes  . Smokeless tobacco: Not on file  . Alcohol Use: Yes     Objective: Filed Vitals:   07/05/12 1554  BP: 125/77  Pulse: 86    General: Alert and Oriented, No Acute Distress HEENT: Pupils equal, round, reactive to light. Conjunctivae clear.  Moist mucous membranes Lungs:  clear and comfortable work of breathing Cardiac: Regular rate and rhythm Abdomen: soft and non tender without palpable masses. Extremities: No peripheral edema.  Strong peripheral pulses.  L4 and S1 DTRs two over four bilaterally and symmetric, left Babinski's is downgoing.   impaired light touch sensation over the left L3 dermatome. Long roll on the left is negative with symmetric range of motion to the right. Straight leg raise is positive on the left. FABER and FADIR negative on the  left. No midline back pain, no SI joint pain on the left. Stork test negative bilaterally. Full range of motion strength in the left lower extremity. No greater trochanter tenderness to palpation Mental Status: No depression, anxiety, nor agitation. Skin: Warm and dry.  Assessment & Plan: Shannel was seen today for left leg pain.  Diagnoses and associated orders for this visit:  Sciatica, left - predniSONE (DELTASONE) 20 MG tablet; Three tabs daily days 1-3, two tabs daily days 4-6, one tab daily days 7-9, half tab daily days 10-13. - Ambulatory referral to Physical Therapy - HYDROcodone-acetaminophen (NORCO/VICODIN) 5-325 MG per tablet; Take 2 tablets by mouth 2 (two) times daily as needed for pain.    Discussed with patient that her most likely diagnosis is sciatica  and treatment includes anti-inflammatories including physical therapy. She is unable to take nonsteroidal anti-inflammatories do to a history of gastritis , will start prednisone taper and referral to physical therapy. Discussed low probability of lumbar radiculopathy from a disc bulge however if not improving by next week I have asked her to see Dr. Karie Schwalbe. in sports medicine in consultation.   Patient is requesting hydrocodone claiming that insurance would not cover refill from late February. [place] controlled substance database confirms that this was not picked up by the patient. Provided a very small prescription until she discusses this with her PCP if  needed  25 minutes spent face-to-face during visit today of which at least 50% was counseling or coordinating care regarding left sciatica.   Return in about 2 weeks (around 07/19/2012).

## 2012-07-06 ENCOUNTER — Other Ambulatory Visit: Payer: Self-pay | Admitting: Family Medicine

## 2012-07-25 ENCOUNTER — Encounter: Payer: Self-pay | Admitting: Sports Medicine

## 2012-07-25 ENCOUNTER — Ambulatory Visit: Payer: Medicare Other

## 2012-07-25 ENCOUNTER — Ambulatory Visit (INDEPENDENT_AMBULATORY_CARE_PROVIDER_SITE_OTHER): Payer: Medicare Other | Admitting: Sports Medicine

## 2012-07-25 VITALS — BP 100/66 | HR 80 | Wt 153.0 lb

## 2012-07-25 DIAGNOSIS — IMO0002 Reserved for concepts with insufficient information to code with codable children: Secondary | ICD-10-CM

## 2012-07-25 DIAGNOSIS — M5414 Radiculopathy, thoracic region: Secondary | ICD-10-CM | POA: Insufficient documentation

## 2012-07-25 DIAGNOSIS — M5416 Radiculopathy, lumbar region: Secondary | ICD-10-CM | POA: Insufficient documentation

## 2012-07-25 MED ORDER — CYCLOBENZAPRINE HCL 10 MG PO TABS: ORAL_TABLET | ORAL | Status: DC

## 2012-07-25 MED ORDER — PREDNISONE 10 MG PO TABS: ORAL_TABLET | ORAL | Status: DC

## 2012-07-25 MED ORDER — MELOXICAM 15 MG PO TABS: ORAL_TABLET | ORAL | Status: DC

## 2012-07-25 NOTE — Assessment & Plan Note (Signed)
Prednisone taper, Flexeril, x-rays, formal physical therapy. Return in 4 weeks, MRI for interventional injection planning if no better.

## 2012-07-25 NOTE — Progress Notes (Signed)
  Subjective:    CC: Left buttock and leg pain  HPI: This is a pleasant 67 year-old woman who presents with severe pain in her left buttock that radiates down her leg for 6 months. The pain begins at the left buttock and radiates down her posterior thigh, the anterolateral aspect of her knee, and wraps around her lower leg and terminates in her lateral three toes. She describes the pain as "deep, aching, stabbing" pain in her buttock, and "pins and needles" down her leg. She also complains of numbness on the top of her foot. It is worse with long car rides, bending forward, and coughing or sneezing. It has gradually worsened since onset.  She has seen a chiropractor multiple times, which has provided only temporary relief, and she has taken hydrocodone, which provides moderate relief. She was seen earlier this month for similar pain and received a steroid taper with moderate improvement.   She denies saddle anesthesia and bowel/bladder dysfunction.  Past medical history, Surgical history, Family history not pertinant except as noted below, Social history, Allergies, and medications have been entered into the medical record, reviewed, and no changes needed.   Review of Systems: No fevers, chills, night sweats, weight loss, chest pain, or shortness of breath.   Objective:    General: Well Developed, well nourished, and in no acute distress.  Neuro: Alert and oriented x3, extra-ocular muscles intact, sensation grossly intact.  HEENT: Normocephalic, atraumatic, pupils equal round reactive to light, neck supple, no masses, no lymphadenopathy, thyroid nonpalpable.  Skin: Warm and dry, no rashes. Cardiac: Regular rate and rhythm, no murmurs rubs or gallops.  Respiratory: Clear to auscultation bilaterally. Not using accessory muscles, speaking in full sentences. Back Exam:  Inspection: Unremarkable  Positive neural slump. Palpable tenderness: None. Sensory change: Gross sensation intact to all  lumbar and sacral dermatomes.  Reflexes: 2+ at both patellar tendons, 2+ at achilles tendons, Babinski's downgoing.  Strength at foot  Plantar-flexion: 5/5 Dorsi-flexion: 5/5 Eversion: 5/5 Inversion: 5/5  Leg strength  Quad: 5/5 Hamstring: 5/5 Hip flexor: 5/5 Hip abductors: 5/5  Gait unremarkable.  Impression and Recommendations:    I was present for all essential parts of this visit and procedure. Ihor Austin. Benjamin Stain, M.D.

## 2012-07-26 ENCOUNTER — Ambulatory Visit (HOSPITAL_BASED_OUTPATIENT_CLINIC_OR_DEPARTMENT_OTHER)
Admission: RE | Admit: 2012-07-26 | Discharge: 2012-07-26 | Disposition: A | Discharge status: Home / Self Care | Payer: Medicare Other | Source: Ambulatory Visit | Attending: Sports Medicine | Admitting: Sports Medicine

## 2012-07-26 ENCOUNTER — Other Ambulatory Visit: Payer: Self-pay | Admitting: Sports Medicine

## 2012-07-26 ENCOUNTER — Ambulatory Visit (HOSPITAL_BASED_OUTPATIENT_CLINIC_OR_DEPARTMENT_OTHER): Admission: RE | Admit: 2012-07-26 | Payer: Medicare Other | Source: Ambulatory Visit

## 2012-07-26 DIAGNOSIS — M5416 Radiculopathy, lumbar region: Secondary | ICD-10-CM

## 2012-07-26 DIAGNOSIS — M549 Dorsalgia, unspecified: Secondary | ICD-10-CM | POA: Insufficient documentation

## 2012-07-26 DIAGNOSIS — M79609 Pain in unspecified limb: Secondary | ICD-10-CM | POA: Insufficient documentation

## 2012-08-01 ENCOUNTER — Other Ambulatory Visit: Payer: Self-pay | Admitting: Family Medicine

## 2012-08-01 NOTE — Telephone Encounter (Signed)
OK to fill for one more month but needs appt. I haven't see in her in 6 months for these conditions.

## 2012-08-16 ENCOUNTER — Encounter: Payer: Self-pay | Admitting: Family Medicine

## 2012-08-16 ENCOUNTER — Ambulatory Visit (INDEPENDENT_AMBULATORY_CARE_PROVIDER_SITE_OTHER): Payer: Medicare Other | Admitting: Family Medicine

## 2012-08-16 VITALS — BP 92/61 | HR 71 | Temp 98.3°F | Wt 153.0 lb

## 2012-08-16 DIAGNOSIS — A499 Bacterial infection, unspecified: Secondary | ICD-10-CM

## 2012-08-16 DIAGNOSIS — R059 Cough, unspecified: Secondary | ICD-10-CM

## 2012-08-16 DIAGNOSIS — J329 Chronic sinusitis, unspecified: Secondary | ICD-10-CM

## 2012-08-16 DIAGNOSIS — B9689 Other specified bacterial agents as the cause of diseases classified elsewhere: Secondary | ICD-10-CM

## 2012-08-16 DIAGNOSIS — R05 Cough: Secondary | ICD-10-CM

## 2012-08-16 MED ORDER — HYDROCODONE-HOMATROPINE 5-1.5 MG/5ML PO SYRP: 5.0000 mL | ORAL_SOLUTION | Freq: Every evening | ORAL | Status: DC | PRN

## 2012-08-16 MED ORDER — DOXYCYCLINE HYCLATE 100 MG PO TABS: ORAL_TABLET | ORAL | Status: AC

## 2012-08-16 NOTE — Progress Notes (Signed)
CC: Annette Ramirez is a 67 y.o. female is here for URI   Subjective: HPI:  Patient complains of facial pressure and green nasal discharge with fatigue and subjective fevers all of which present since mid last week. Symptoms worsen a daily basis. Overall moderate in severity. No interventions as of yet. Symptoms present all hours of the day but worse in the morning. Nothing particularly makes better or worse. Has been associated with cough when lying down flat for the past 3 days it interferes with sleep. Denies chills, confusion, wheezing, shortness of breath, productive cough, chest pain, back pain, nausea   Review Of Systems Outlined In HPI  Past Medical History  Diagnosis Date  . COPD (chronic obstructive pulmonary disease)      Family History  Problem Relation Age of Onset  . Alzheimer's disease Mother   . Prostate cancer      grandfather  . Stroke      grandparent     History  Substance Use Topics  . Smoking status: Current Every Day Smoker    Types: Cigarettes  . Smokeless tobacco: Not on file  . Alcohol Use: Yes     Objective: Filed Vitals:   08/16/12 1345  BP: 92/61  Pulse: 71  Temp: 98.3 F (36.8 C)    General: Alert and Oriented, No Acute Distress HEENT: Pupils equal, round, reactive to light. Conjunctivae clear.  External ears unremarkable, canals clear with intact TMs with appropriate landmarks.  Middle ear appears open without effusion. Boggy inferior turbinates with moderate mucoid discharge.  Moist mucous membranes, pharynx without inflammation nor lesions.  Neck supple without palpable lymphadenopathy nor abnormal masses. Lungs: Clear to auscultation bilaterally, no wheezing/ronchi/rales.  Comfortable work of breathing. Good air movement. Cardiac: Regular rate and rhythm. Normal S1/S2.  No murmurs, rubs, nor gallops.   Extremities: No peripheral edema.  Strong peripheral pulses.  Mental Status: No depression, anxiety, nor agitation. Skin: Warm and  dry.  Assessment & Plan: Annette Ramirez was seen today for uri.  Diagnoses and associated orders for this visit:  Bacterial sinusitis - doxycycline (VIBRA-TABS) 100 MG tablet; One by mouth twice a day for ten days.  Cough - HYDROcodone-homatropine (HYCODAN) 5-1.5 MG/5ML syrup; Take 5 mLs by mouth at bedtime as needed for cough.    Bacterial sinusitis with cough secondary to postnasal drip. Start doxycycline, Mucinex, nasal saline washes, may use Hycodan for sleep aid.  Return if symptoms worsen or fail to improve.

## 2012-09-04 ENCOUNTER — Other Ambulatory Visit: Payer: Self-pay | Admitting: Family Medicine

## 2012-10-04 ENCOUNTER — Other Ambulatory Visit: Payer: Self-pay | Admitting: Family Medicine

## 2012-10-12 ENCOUNTER — Other Ambulatory Visit: Payer: Self-pay | Admitting: Family Medicine

## 2012-10-20 ENCOUNTER — Other Ambulatory Visit: Payer: Self-pay | Admitting: Family Medicine

## 2012-10-27 ENCOUNTER — Other Ambulatory Visit: Payer: Self-pay | Admitting: Family Medicine

## 2012-10-27 ENCOUNTER — Other Ambulatory Visit: Payer: Self-pay | Admitting: *Deleted

## 2012-10-27 MED ORDER — ALPRAZOLAM 0.25 MG PO TABS: ORAL_TABLET | ORAL | Status: DC

## 2012-10-30 ENCOUNTER — Other Ambulatory Visit: Payer: Self-pay | Admitting: *Deleted

## 2012-10-30 ENCOUNTER — Other Ambulatory Visit: Payer: Self-pay | Admitting: Family Medicine

## 2012-10-30 MED ORDER — PENTAZOCINE-NALOXONE HCL 50-0.5 MG PO TABS: ORAL_TABLET | ORAL | Status: DC

## 2012-12-22 ENCOUNTER — Other Ambulatory Visit: Payer: Self-pay | Admitting: Family Medicine

## 2012-12-29 ENCOUNTER — Other Ambulatory Visit: Payer: Self-pay | Admitting: Family Medicine

## 2013-01-15 ENCOUNTER — Ambulatory Visit (INDEPENDENT_AMBULATORY_CARE_PROVIDER_SITE_OTHER): Payer: Medicare Other | Admitting: Family Medicine

## 2013-01-15 ENCOUNTER — Other Ambulatory Visit: Payer: Self-pay | Admitting: Family Medicine

## 2013-01-15 ENCOUNTER — Other Ambulatory Visit: Payer: Self-pay | Admitting: Sports Medicine

## 2013-01-15 ENCOUNTER — Encounter: Payer: Self-pay | Admitting: Family Medicine

## 2013-01-15 VITALS — BP 114/63 | HR 61 | Wt 148.0 lb

## 2013-01-15 DIAGNOSIS — M5432 Sciatica, left side: Secondary | ICD-10-CM

## 2013-01-15 DIAGNOSIS — M5416 Radiculopathy, lumbar region: Secondary | ICD-10-CM

## 2013-01-15 DIAGNOSIS — IMO0002 Reserved for concepts with insufficient information to code with codable children: Secondary | ICD-10-CM

## 2013-01-15 DIAGNOSIS — M5430 Sciatica, unspecified side: Secondary | ICD-10-CM

## 2013-01-15 MED ORDER — PREDNISONE 20 MG PO TABS: ORAL_TABLET | ORAL | Status: AC

## 2013-01-15 MED ORDER — HYDROCODONE-ACETAMINOPHEN 5-325 MG PO TABS: 1.0000 | ORAL_TABLET | Freq: Three times a day (TID) | ORAL | Status: DC | PRN

## 2013-01-15 MED ORDER — CYCLOBENZAPRINE HCL 10 MG PO TABS: 10.0000 mg | ORAL_TABLET | Freq: Three times a day (TID) | ORAL | Status: DC | PRN

## 2013-01-15 NOTE — Progress Notes (Signed)
CC: Annette Ramirez is a 67 y.o. female is here for left lower back /hip pain   Subjective: HPI:  Patient complains of low lumbar back pain with severe radiation down her left leg that has been present at moderate to severe severity for the past 4 days. Symptoms came on abruptly when she got out of bed on Thursday. She describes a sharp shooting sensation in the low back radiating through the entire left leg. Since the week and she's also had tingling on the plantar surface of her left foot. She denies any recent trauma or overexertion. She's been seen for this before at our clinic and responded to prednisone, meloxicam was not much help. Symptoms have been present for a very mild degree since that time last spring however significantly worsened recently. Pain is worse when sitting on the left buttock bearing weight on the left leg or with walking. Nothing else makes better or worse. Pain is interfering with sleep and activities of daily living. She denies saddle paresthesia, bowel or bladder discomfort, weakness or sensory disturbances in the lower extremities other than that described above. Denies skin changes at the site of discomfort.   Review Of Systems Outlined In HPI  Past Medical History  Diagnosis Date  . COPD (chronic obstructive pulmonary disease)      Family History  Problem Relation Age of Onset  . Alzheimer's disease Mother   . Prostate cancer      grandfather  . Stroke      grandparent     History  Substance Use Topics  . Smoking status: Current Every Day Smoker    Types: Cigarettes  . Smokeless tobacco: Not on file  . Alcohol Use: Yes     Objective: Filed Vitals:   01/15/13 1508  BP: 114/63  Pulse: 61    General: Alert and Oriented, No Acute Distress HEENT: Pupils equal, round, reactive to light. Conjunctivae clear.   Moist mucous membranes. Lungs: Clear to auscultation bilaterally, no wheezing/ronchi/rales.  Comfortable work of breathing. Good air  movement. Cardiac: Regular rate and rhythm. Normal S1/S2.  No murmurs, rubs, nor gallops.   Extremities: No peripheral edema.  Strong peripheral pulses.  Back: L4-L3 mild spinous process tenderness to palpation does not reproduce presenting pain no paraspinal musculature discomfort to palpation. Left lower extremity: Log roll negative, straight leg raise negative, FABER/FADIR negative, full range of motion strength. Monofilament testing normal on the left foot. Pain is not reproduced with flexion or extension of the lumbar spine. L4 and S1 DTRs two over four bilaterally  Mental Status: No depression, anxiety, nor agitation. Skin: Warm and dry.  Assessment & Plan: Annette Ramirez was seen today for left lower back /hip pain.  Diagnoses and associated orders for this visit:  Left lumbar radiculitis - MR Lumbar Spine Wo Contrast; Future - predniSONE (DELTASONE) 20 MG tablet; Three tabs daily days 1-3, two tabs daily days 4-6, one tab daily days 7-9, half tab daily days 10-13. - HYDROcodone-acetaminophen (NORCO/VICODIN) 5-325 MG per tablet; Take 1 tablet by mouth every 8 (eight) hours as needed for pain. - cyclobenzaprine (FLEXERIL) 10 MG tablet; Take 1 tablet (10 mg total) by mouth 3 (three) times daily as needed (back pain).  Sciatica, left    Discussed with patient my suspicion of nerve impingement from the lumbar spine or discs, MRI will be obtained to evaluate for surgical candidacy or epidural injection candidacy. Prednisone taper with as needed hydrocodone and Flexeril  Return if symptoms worsen or fail to  improve.

## 2013-01-15 NOTE — Telephone Encounter (Signed)
OK to fill. Last seen 08/2012

## 2013-01-24 ENCOUNTER — Telehealth: Payer: Self-pay | Admitting: *Deleted

## 2013-01-24 NOTE — Telephone Encounter (Signed)
PA approved for MRI  L-spine WO cm through [facility].

## 2013-01-30 ENCOUNTER — Ambulatory Visit (HOSPITAL_BASED_OUTPATIENT_CLINIC_OR_DEPARTMENT_OTHER)
Admission: RE | Admit: 2013-01-30 | Discharge: 2013-01-30 | Disposition: A | Discharge status: Home / Self Care | Payer: Medicare Other | Source: Ambulatory Visit | Attending: Family Medicine | Admitting: Family Medicine

## 2013-01-30 ENCOUNTER — Telehealth: Payer: Self-pay | Admitting: Family Medicine

## 2013-01-30 DIAGNOSIS — M48061 Spinal stenosis, lumbar region without neurogenic claudication: Secondary | ICD-10-CM | POA: Insufficient documentation

## 2013-01-30 DIAGNOSIS — M7130 Other bursal cyst, unspecified site: Secondary | ICD-10-CM | POA: Insufficient documentation

## 2013-01-30 DIAGNOSIS — M47814 Spondylosis without myelopathy or radiculopathy, thoracic region: Secondary | ICD-10-CM | POA: Insufficient documentation

## 2013-01-30 DIAGNOSIS — M5416 Radiculopathy, lumbar region: Secondary | ICD-10-CM

## 2013-01-30 DIAGNOSIS — M7138 Other bursal cyst, other site: Secondary | ICD-10-CM

## 2013-01-30 DIAGNOSIS — M5126 Other intervertebral disc displacement, lumbar region: Secondary | ICD-10-CM | POA: Insufficient documentation

## 2013-01-30 NOTE — Telephone Encounter (Signed)
Sue Lush, Will you please let Annette Ramirez know that her MRI revealed a cyst in coming off her spine that is impinging on a nerve root that would likely explain her left sided pain.  I've referred her to our spine specialist colleagues so they can discuss whether steroid injections or surgery is the next best step.

## 2013-01-31 NOTE — Telephone Encounter (Signed)
Pt.notified

## 2013-02-02 ENCOUNTER — Telehealth: Payer: Self-pay | Admitting: *Deleted

## 2013-02-02 NOTE — Telephone Encounter (Signed)
Spoke with pt's husband and will come by Monday and p/u form.Loralee Pacas [place]ci

## 2013-02-06 ENCOUNTER — Telehealth: Payer: Self-pay | Admitting: *Deleted

## 2013-02-06 NOTE — Telephone Encounter (Signed)
PA obtained for MRI Lumbar w/o contrast. Linda at [facility] informed.  Auth # [id number].  Meyer Cory, LPN

## 2013-02-13 ENCOUNTER — Other Ambulatory Visit: Payer: Self-pay | Admitting: Orthopedic Surgery

## 2013-02-20 ENCOUNTER — Encounter (HOSPITAL_COMMUNITY): Payer: Self-pay | Admitting: Pharmacy Technician

## 2013-02-22 ENCOUNTER — Ambulatory Visit (HOSPITAL_COMMUNITY)
Admission: RE | Admit: 2013-02-22 | Discharge: 2013-02-22 | Disposition: A | Discharge status: Home / Self Care | Payer: Medicare Other | Source: Ambulatory Visit | Attending: Orthopedic Surgery | Admitting: Orthopedic Surgery

## 2013-02-22 ENCOUNTER — Encounter (HOSPITAL_COMMUNITY)
Admission: RE | Admit: 2013-02-22 | Discharge: 2013-02-22 | Disposition: A | Discharge status: Home / Self Care | Payer: Medicare Other | Source: Ambulatory Visit | Attending: Orthopedic Surgery | Admitting: Orthopedic Surgery

## 2013-02-22 ENCOUNTER — Other Ambulatory Visit (HOSPITAL_COMMUNITY): Payer: Self-pay | Admitting: *Deleted

## 2013-02-22 ENCOUNTER — Encounter (HOSPITAL_COMMUNITY): Payer: Self-pay

## 2013-02-22 DIAGNOSIS — F17200 Nicotine dependence, unspecified, uncomplicated: Secondary | ICD-10-CM | POA: Insufficient documentation

## 2013-02-22 DIAGNOSIS — Z01818 Encounter for other preprocedural examination: Secondary | ICD-10-CM | POA: Insufficient documentation

## 2013-02-22 DIAGNOSIS — R05 Cough: Secondary | ICD-10-CM | POA: Insufficient documentation

## 2013-02-22 DIAGNOSIS — R059 Cough, unspecified: Secondary | ICD-10-CM | POA: Insufficient documentation

## 2013-02-22 DIAGNOSIS — Z01812 Encounter for preprocedural laboratory examination: Secondary | ICD-10-CM | POA: Insufficient documentation

## 2013-02-22 HISTORY — DX: Fibromyalgia: M79.7

## 2013-02-22 HISTORY — DX: Age-related osteoporosis without current pathological fracture: M81.0

## 2013-02-22 HISTORY — DX: Nausea with vomiting, unspecified: R11.2

## 2013-02-22 HISTORY — DX: Essential (primary) hypertension: I10

## 2013-02-22 HISTORY — DX: Major depressive disorder, single episode, unspecified: F32.9

## 2013-02-22 HISTORY — DX: Depression, unspecified: F32.A

## 2013-02-22 HISTORY — DX: Other specified postprocedural states: Z98.890

## 2013-02-22 HISTORY — DX: Gastro-esophageal reflux disease without esophagitis: K21.9

## 2013-02-22 LAB — COMPREHENSIVE METABOLIC PANEL
ALT: 12 U/L (ref 0–35)
Albumin: 4.2 g/dL (ref 3.5–5.2)
Alkaline Phosphatase: 59 U/L (ref 39–117)
BUN: 14 mg/dL (ref 6–23)
CO2: 31 mEq/L (ref 19–32)
Chloride: 96 mEq/L (ref 96–112)
Creatinine, Ser: 0.82 mg/dL (ref 0.50–1.10)
GFR calc non Af Amer: 71 mL/min — ABNORMAL LOW (ref >90)
Glucose, Bld: 94 mg/dL (ref 70–99)
Potassium: 4.8 mEq/L (ref 3.5–5.1)
Sodium: 135 mEq/L (ref 135–145)
Total Bilirubin: 0.7 mg/dL (ref 0.3–1.2)
Total Protein: 7 g/dL (ref 6.0–8.3)

## 2013-02-22 LAB — URINALYSIS, ROUTINE W REFLEX MICROSCOPIC
Bilirubin Urine: NEGATIVE
Nitrite: NEGATIVE
Specific Gravity, Urine: 1.009 (ref 1.005–1.030)
Urobilinogen, UA: 0.2 mg/dL (ref 0.0–1.0)
pH: 6 (ref 5.0–8.0)

## 2013-02-22 LAB — URINE MICROSCOPIC-ADD ON

## 2013-02-22 LAB — CBC WITH DIFFERENTIAL/PLATELET
Basophils Relative: 1 % (ref 0–1)
Eosinophils Relative: 3 % (ref 0–5)
HCT: 42.5 % (ref 36.0–46.0)
Hemoglobin: 14.9 g/dL (ref 12.0–15.0)
Lymphocytes Relative: 38 % (ref 12–46)
MCH: 34.7 pg — ABNORMAL HIGH (ref 26.0–34.0)
MCHC: 35.1 g/dL (ref 30.0–36.0)
MCV: 98.8 fL (ref 78.0–100.0)
Monocytes Absolute: 0.9 10*3/uL (ref 0.1–1.0)
Monocytes Relative: 15 % — ABNORMAL HIGH (ref 3–12)
Platelets: 193 10*3/uL (ref 150–400)
RDW: 12.6 % (ref 11.5–15.5)
WBC: 6.2 10*3/uL (ref 4.0–10.5)

## 2013-02-22 LAB — PROTIME-INR: INR: 0.98 (ref 0.00–1.49)

## 2013-02-22 LAB — TYPE AND SCREEN: Antibody Screen: NEGATIVE

## 2013-02-22 NOTE — Pre-Procedure Instructions (Signed)
Annette Ramirez  02/22/2013   Your procedure is scheduled on:  Wednesday, February 28, 2013 at 1:40 PM.   Report to [facility] Entrance "A" at 11:45 AM.   Call this number if you have problems the morning of surgery: [phone]   Remember:   Do not eat food or drink liquids after midnight Tuesday, 02/27/13.   Take these medicines the morning of surgery with A SIP OF WATER: atenolol (TENORMIN), citalopram (CELEXA), omeprazole (PRILOSEC), pregabalin (LYRICA), ALPRAZolam (XANAX) - if needed, cyclobenzaprine (FLEXERIL) -if needed, albuterol (PROVENTIL HFA;VENTOLIN HFA)  and pain med if needed  Stop all Vitamins, Herbal Medications, Aspirin and NSAIDS (Mobic, Meloxicam) as of today, 02/22/13.                Do not wear jewelry, make-up or nail polish.  Do not wear lotions, powders, or perfumes. You may wear deodorant.  Do not shave 48 hours prior to surgery.   Do not bring valuables to the hospital.  [facility] is not responsible                  for any belongings or valuables.               Contacts, dentures or bridgework may not be worn into surgery.  Leave suitcase in the car. After surgery it may be brought to your room.  For patients admitted to the hospital, discharge time is determined by your                treatment team.            Special Instructions: Shower using CHG 2 nights before surgery and the night before surgery.  If you shower the day of surgery use CHG.  Use special wash - you have one bottle of CHG for all showers.  You should use approximately 1/3 of the bottle for each shower.   Please read over the following fact sheets that you were given: Pain Booklet, Coughing and Deep Breathing, Blood Transfusion Information, MRSA Information and Surgical Site Infection Prevention

## 2013-02-23 ENCOUNTER — Other Ambulatory Visit: Payer: Self-pay | Admitting: Family Medicine

## 2013-02-23 NOTE — Progress Notes (Signed)
Anesthesia chart review:  Patient is a 67 year old female scheduled for L4-5 decompression on 02/28/13 by Dr. Yevette Edwards.  History includes smoking, COPD, GERD, HTN, fibromyalgia, depression, osteoporosis, headaches, post-operative N/V, tonsillectomy, hysterectomy, appendectomy.  PCP is listed as Dr. Nani Gasser ([facility]).  EKG on 02/22/13 showed SB @ 58 bpm, LAD, incomplete right BBB, cannot rule out anteroseptal infarct (age undetermined).  Lead II is nearly non-existent on prior EKG from 09/25/08, but it appears that septal infarct and likely LAD were also present on EKG then. No CV symptoms were documented at her PAT visit.  Preoperative CXR and labs noted.    I reviewed above with anesthesiologist Dr. Noreene Larsson.  She will be further evaluated by her assigned anesthesiologist on the day of surgery.  If no significant changes or new CV symptomology then it is anticipated that she can proceed as planned.  Velna Ochs [facility]/Anesthesiology Phone [phone] 02/23/2013 4:52 PM

## 2013-02-27 ENCOUNTER — Other Ambulatory Visit: Payer: Self-pay | Admitting: Family Medicine

## 2013-02-27 MED ORDER — VANCOMYCIN HCL IN DEXTROSE 1-5 GM/200ML-% IV SOLN
1000.0000 mg | INTRAVENOUS | Status: AC
  Administered 2013-02-28: 1000 mg via INTRAVENOUS
  Filled 2013-02-27: qty 200

## 2013-02-27 NOTE — Progress Notes (Signed)
Pt notified of time change;to arrive at 0630 

## 2013-02-28 ENCOUNTER — Observation Stay (HOSPITAL_COMMUNITY)
Admission: RE | Admit: 2013-02-28 | Discharge: 2013-03-01 | Disposition: A | Discharge status: Home / Self Care | Payer: Medicare Other | Source: Ambulatory Visit | Attending: Orthopedic Surgery | Admitting: Orthopedic Surgery

## 2013-02-28 ENCOUNTER — Ambulatory Visit (HOSPITAL_COMMUNITY): Payer: Medicare Other

## 2013-02-28 ENCOUNTER — Encounter (HOSPITAL_COMMUNITY): Payer: Medicare Other | Admitting: Vascular Surgery

## 2013-02-28 ENCOUNTER — Ambulatory Visit (HOSPITAL_COMMUNITY): Payer: Medicare Other | Admitting: Anesthesiology

## 2013-02-28 ENCOUNTER — Encounter (HOSPITAL_COMMUNITY): Payer: Self-pay | Admitting: *Deleted

## 2013-02-28 ENCOUNTER — Encounter (HOSPITAL_COMMUNITY)
Admission: RE | Disposition: A | Discharge status: Home / Self Care | Payer: Self-pay | Source: Ambulatory Visit | Attending: Orthopedic Surgery

## 2013-02-28 DIAGNOSIS — J4489 Other specified chronic obstructive pulmonary disease: Secondary | ICD-10-CM | POA: Insufficient documentation

## 2013-02-28 DIAGNOSIS — IMO0002 Reserved for concepts with insufficient information to code with codable children: Secondary | ICD-10-CM | POA: Insufficient documentation

## 2013-02-28 DIAGNOSIS — M810 Age-related osteoporosis without current pathological fracture: Secondary | ICD-10-CM | POA: Insufficient documentation

## 2013-02-28 DIAGNOSIS — M5416 Radiculopathy, lumbar region: Secondary | ICD-10-CM

## 2013-02-28 DIAGNOSIS — Z79899 Other long term (current) drug therapy: Secondary | ICD-10-CM | POA: Insufficient documentation

## 2013-02-28 DIAGNOSIS — Z23 Encounter for immunization: Secondary | ICD-10-CM | POA: Insufficient documentation

## 2013-02-28 DIAGNOSIS — J449 Chronic obstructive pulmonary disease, unspecified: Secondary | ICD-10-CM | POA: Insufficient documentation

## 2013-02-28 DIAGNOSIS — I10 Essential (primary) hypertension: Secondary | ICD-10-CM | POA: Insufficient documentation

## 2013-02-28 DIAGNOSIS — M8540 Solitary bone cyst, unspecified site: Principal | ICD-10-CM | POA: Insufficient documentation

## 2013-02-28 HISTORY — DX: Migraine, unspecified, not intractable, without status migrainosus: G43.909

## 2013-02-28 HISTORY — DX: Headache, unspecified: R51.9

## 2013-02-28 HISTORY — DX: Anxiety disorder, unspecified: F41.9

## 2013-02-28 HISTORY — PX: LUMBAR LAMINECTOMY: SHX95

## 2013-02-28 HISTORY — DX: Shortness of breath: R06.02

## 2013-02-28 HISTORY — PX: LUMBAR LAMINECTOMY/DECOMPRESSION MICRODISCECTOMY: SHX5026

## 2013-02-28 HISTORY — DX: Personal history of other diseases of the digestive system: Z87.19

## 2013-02-28 HISTORY — DX: Headache: R51

## 2013-02-28 HISTORY — DX: Family history of other specified conditions: Z84.89

## 2013-02-28 SURGERY — LUMBAR LAMINECTOMY/DECOMPRESSION MICRODISCECTOMY
Anesthesia: General | Site: Spine Lumbar | Wound class: Clean

## 2013-02-28 MED ORDER — THROMBIN 20000 UNITS EX SOLR
CUTANEOUS | Status: AC
  Filled 2013-02-28: qty 20000

## 2013-02-28 MED ORDER — OXYCODONE-ACETAMINOPHEN 5-325 MG PO TABS
1.0000 | ORAL_TABLET | ORAL | Status: DC | PRN
  Administered 2013-02-28 – 2013-03-01 (×4): 2 via ORAL
  Filled 2013-02-28 (×4): qty 2

## 2013-02-28 MED ORDER — ALPRAZOLAM 0.25 MG PO TABS: 0.2500 mg | ORAL_TABLET | Freq: Three times a day (TID) | ORAL | Status: DC | PRN

## 2013-02-28 MED ORDER — DEXAMETHASONE SODIUM PHOSPHATE 4 MG/ML IJ SOLN
INTRAMUSCULAR | Status: DC | PRN
  Administered 2013-02-28: 4 mg via INTRAVENOUS

## 2013-02-28 MED ORDER — INFLUENZA VAC SPLIT QUAD 0.5 ML IM SUSP
0.5000 mL | INTRAMUSCULAR | Status: AC
  Administered 2013-03-01: 0.5 mL via INTRAMUSCULAR
  Filled 2013-02-28: qty 0.5

## 2013-02-28 MED ORDER — MENTHOL 3 MG MT LOZG: 1.0000 | LOZENGE | OROMUCOSAL | Status: DC | PRN

## 2013-02-28 MED ORDER — ATENOLOL 100 MG PO TABS
100.0000 mg | ORAL_TABLET | Freq: Two times a day (BID) | ORAL | Status: DC
  Administered 2013-02-28: 100 mg via ORAL
  Filled 2013-02-28 (×3): qty 1

## 2013-02-28 MED ORDER — METHYLPREDNISOLONE ACETATE 40 MG/ML IJ SUSP
INTRAMUSCULAR | Status: AC
  Filled 2013-02-28: qty 1

## 2013-02-28 MED ORDER — HYDROCHLOROTHIAZIDE 25 MG PO TABS
25.0000 mg | ORAL_TABLET | Freq: Every day | ORAL | Status: DC
  Filled 2013-02-28: qty 1

## 2013-02-28 MED ORDER — HYDROMORPHONE HCL PF 1 MG/ML IJ SOLN
0.2500 mg | INTRAMUSCULAR | Status: DC | PRN
  Administered 2013-02-28 (×4): 0.5 mg via INTRAVENOUS

## 2013-02-28 MED ORDER — PENTAZOCINE-NALOXONE HCL 50-0.5 MG PO TABS: 0.5000 | ORAL_TABLET | Freq: Four times a day (QID) | ORAL | Status: DC | PRN

## 2013-02-28 MED ORDER — ACETAMINOPHEN 325 MG PO TABS: 650.0000 mg | ORAL_TABLET | ORAL | Status: DC | PRN

## 2013-02-28 MED ORDER — THROMBIN 20000 UNITS EX SOLR
CUTANEOUS | Status: DC | PRN
  Administered 2013-02-28: 09:00:00 via TOPICAL

## 2013-02-28 MED ORDER — ALBUTEROL SULFATE HFA 108 (90 BASE) MCG/ACT IN AERS
2.0000 | INHALATION_SPRAY | Freq: Four times a day (QID) | RESPIRATORY_TRACT | Status: DC | PRN
  Filled 2013-02-28: qty 6.7

## 2013-02-28 MED ORDER — EPHEDRINE SULFATE 50 MG/ML IJ SOLN
INTRAMUSCULAR | Status: DC | PRN
  Administered 2013-02-28: 10 mg via INTRAVENOUS

## 2013-02-28 MED ORDER — PREGABALIN 50 MG PO CAPS
75.0000 mg | ORAL_CAPSULE | Freq: Two times a day (BID) | ORAL | Status: DC
  Administered 2013-02-28: 75 mg via ORAL
  Filled 2013-02-28 (×2): qty 1

## 2013-02-28 MED ORDER — LIDOCAINE HCL (CARDIAC) 20 MG/ML IV SOLN
INTRAVENOUS | Status: DC | PRN
  Administered 2013-02-28: 100 mg via INTRAVENOUS

## 2013-02-28 MED ORDER — POVIDONE-IODINE 7.5 % EX SOLN
Freq: Once | CUTANEOUS | Status: DC
  Filled 2013-02-28: qty 118

## 2013-02-28 MED ORDER — BUPIVACAINE-EPINEPHRINE 0.25% -1:200000 IJ SOLN
INTRAMUSCULAR | Status: DC | PRN
  Administered 2013-02-28: 24 mL

## 2013-02-28 MED ORDER — PHENYLEPHRINE HCL 10 MG/ML IJ SOLN
10.0000 mg | INTRAVENOUS | Status: DC | PRN
  Administered 2013-02-28: 10 ug/min via INTRAVENOUS

## 2013-02-28 MED ORDER — METHYLENE BLUE 1 % INJ SOLN
1.0000 mL | INTRAMUSCULAR | Status: DC
  Filled 2013-02-28: qty 1

## 2013-02-28 MED ORDER — ACETAMINOPHEN 650 MG RE SUPP: 650.0000 mg | RECTAL | Status: DC | PRN

## 2013-02-28 MED ORDER — PHENOL 1.4 % MT LIQD: 1.0000 | OROMUCOSAL | Status: DC | PRN

## 2013-02-28 MED ORDER — SODIUM CHLORIDE 0.9 % IV SOLN: 250.0000 mL | INTRAVENOUS | Status: DC

## 2013-02-28 MED ORDER — SENNOSIDES-DOCUSATE SODIUM 8.6-50 MG PO TABS: 1.0000 | ORAL_TABLET | Freq: Every evening | ORAL | Status: DC | PRN

## 2013-02-28 MED ORDER — ZOLPIDEM TARTRATE 5 MG PO TABS
5.0000 mg | ORAL_TABLET | Freq: Every evening | ORAL | Status: DC | PRN
  Administered 2013-02-28: 5 mg via ORAL
  Filled 2013-02-28: qty 1

## 2013-02-28 MED ORDER — NEOSTIGMINE METHYLSULFATE 1 MG/ML IJ SOLN
INTRAMUSCULAR | Status: DC | PRN
  Administered 2013-02-28: 5 mg via INTRAVENOUS

## 2013-02-28 MED ORDER — PROMETHAZINE HCL 25 MG/ML IJ SOLN: 6.2500 mg | INTRAMUSCULAR | Status: DC | PRN

## 2013-02-28 MED ORDER — DIAZEPAM 5 MG PO TABS
ORAL_TABLET | ORAL | Status: AC
  Filled 2013-02-28: qty 1

## 2013-02-28 MED ORDER — FLEET ENEMA 7-19 GM/118ML RE ENEM: 1.0000 | ENEMA | Freq: Once | RECTAL | Status: AC | PRN

## 2013-02-28 MED ORDER — BISACODYL 5 MG PO TBEC: 5.0000 mg | DELAYED_RELEASE_TABLET | Freq: Every day | ORAL | Status: DC | PRN

## 2013-02-28 MED ORDER — SODIUM CHLORIDE 0.9 % IJ SOLN: 3.0000 mL | Freq: Two times a day (BID) | INTRAMUSCULAR | Status: DC

## 2013-02-28 MED ORDER — HYDROMORPHONE HCL PF 1 MG/ML IJ SOLN
INTRAMUSCULAR | Status: AC
  Filled 2013-02-28: qty 1

## 2013-02-28 MED ORDER — PROPOFOL 10 MG/ML IV BOLUS
INTRAVENOUS | Status: DC | PRN
  Administered 2013-02-28: 200 mg via INTRAVENOUS

## 2013-02-28 MED ORDER — PANTOPRAZOLE SODIUM 40 MG PO TBEC
80.0000 mg | DELAYED_RELEASE_TABLET | Freq: Every day | ORAL | Status: DC
  Administered 2013-02-28: 80 mg via ORAL
  Filled 2013-02-28: qty 2

## 2013-02-28 MED ORDER — MIDAZOLAM HCL 5 MG/5ML IJ SOLN
INTRAMUSCULAR | Status: DC | PRN
  Administered 2013-02-28: 2 mg via INTRAVENOUS

## 2013-02-28 MED ORDER — DIAZEPAM 5 MG PO TABS
5.0000 mg | ORAL_TABLET | Freq: Four times a day (QID) | ORAL | Status: DC | PRN
  Administered 2013-02-28 (×2): 5 mg via ORAL
  Filled 2013-02-28: qty 1

## 2013-02-28 MED ORDER — GLYCOPYRROLATE 0.2 MG/ML IJ SOLN
INTRAMUSCULAR | Status: DC | PRN
  Administered 2013-02-28: 0.6 mg via INTRAVENOUS

## 2013-02-28 MED ORDER — DOCUSATE SODIUM 100 MG PO CAPS
100.0000 mg | ORAL_CAPSULE | Freq: Two times a day (BID) | ORAL | Status: DC
  Administered 2013-02-28: 100 mg via ORAL
  Filled 2013-02-28 (×3): qty 1

## 2013-02-28 MED ORDER — VANCOMYCIN HCL IN DEXTROSE 1-5 GM/200ML-% IV SOLN
1000.0000 mg | Freq: Once | INTRAVENOUS | Status: AC
  Administered 2013-02-28: 1000 mg via INTRAVENOUS
  Filled 2013-02-28: qty 200

## 2013-02-28 MED ORDER — QUETIAPINE FUMARATE 25 MG PO TABS
25.0000 mg | ORAL_TABLET | Freq: Every day | ORAL | Status: DC
  Administered 2013-02-28: 25 mg via ORAL
  Filled 2013-02-28 (×2): qty 1

## 2013-02-28 MED ORDER — LOSARTAN POTASSIUM-HCTZ 100-25 MG PO TABS: 1.0000 | ORAL_TABLET | Freq: Every day | ORAL | Status: DC

## 2013-02-28 MED ORDER — LOSARTAN POTASSIUM 50 MG PO TABS
100.0000 mg | ORAL_TABLET | Freq: Every day | ORAL | Status: DC
  Filled 2013-02-28: qty 2

## 2013-02-28 MED ORDER — BUPIVACAINE-EPINEPHRINE PF 0.25-1:200000 % IJ SOLN
INTRAMUSCULAR | Status: AC
  Filled 2013-02-28: qty 30

## 2013-02-28 MED ORDER — PHENYLEPHRINE HCL 10 MG/ML IJ SOLN
INTRAMUSCULAR | Status: DC | PRN
  Administered 2013-02-28 (×2): 80 ug via INTRAVENOUS

## 2013-02-28 MED ORDER — SODIUM CHLORIDE 0.9 % IJ SOLN: 3.0000 mL | INTRAMUSCULAR | Status: DC | PRN

## 2013-02-28 MED ORDER — ROCURONIUM BROMIDE 100 MG/10ML IV SOLN
INTRAVENOUS | Status: DC | PRN
  Administered 2013-02-28: 50 mg via INTRAVENOUS

## 2013-02-28 MED ORDER — METHYLPREDNISOLONE ACETATE 40 MG/ML IJ SUSP
INTRAMUSCULAR | Status: DC | PRN
  Administered 2013-02-28: 40 mg via INTRA_ARTICULAR

## 2013-02-28 MED ORDER — METHYLENE BLUE 1 % INJ SOLN
INTRAMUSCULAR | Status: DC | PRN
  Administered 2013-02-28: 1 mL

## 2013-02-28 MED ORDER — LACTATED RINGERS IV SOLN
INTRAVENOUS | Status: DC | PRN
  Administered 2013-02-28 (×2): via INTRAVENOUS

## 2013-02-28 MED ORDER — ONDANSETRON HCL 4 MG/2ML IJ SOLN
INTRAMUSCULAR | Status: DC | PRN
  Administered 2013-02-28: 4 mg via INTRAVENOUS

## 2013-02-28 MED ORDER — CITALOPRAM HYDROBROMIDE 20 MG PO TABS
20.0000 mg | ORAL_TABLET | Freq: Every day | ORAL | Status: DC
  Filled 2013-02-28: qty 1

## 2013-02-28 MED ORDER — ONDANSETRON HCL 4 MG/2ML IJ SOLN: 4.0000 mg | INTRAMUSCULAR | Status: DC | PRN

## 2013-02-28 MED ORDER — SUFENTANIL CITRATE 50 MCG/ML IV SOLN
INTRAVENOUS | Status: DC | PRN
  Administered 2013-02-28 (×4): 10 ug via INTRAVENOUS

## 2013-02-28 SURGICAL SUPPLY — 67 items
BENZOIN TINCTURE PRP APPL 2/3 (GAUZE/BANDAGES/DRESSINGS) ×2 IMPLANT
BUR ROUND PRECISION 4.0 (BURR) ×2 IMPLANT
CANISTER SUCTION 2500CC (MISCELLANEOUS) ×2 IMPLANT
CARTRIDGE OIL MAESTRO DRILL (MISCELLANEOUS) ×1 IMPLANT
CLOTH BEACON ORANGE TIMEOUT ST (SAFETY) ×2 IMPLANT
CLSR STERI-STRIP ANTIMIC 1/2X4 (GAUZE/BANDAGES/DRESSINGS) ×2 IMPLANT
CORDS BIPOLAR (ELECTRODE) ×2 IMPLANT
COVER SURGICAL LIGHT HANDLE (MISCELLANEOUS) ×2 IMPLANT
DIFFUSER DRILL AIR PNEUMATIC (MISCELLANEOUS) ×2 IMPLANT
DRAIN CHANNEL 15F RND FF W/TCR (WOUND CARE) IMPLANT
DRAPE POUCH INSTRU U-SHP 10X18 (DRAPES) ×4 IMPLANT
DRAPE SURG 17X23 STRL (DRAPES) ×8 IMPLANT
DURAPREP 26ML APPLICATOR (WOUND CARE) ×2 IMPLANT
ELECT BLADE 4.0 EZ CLEAN MEGAD (MISCELLANEOUS)
ELECT CAUTERY BLADE 6.4 (BLADE) ×2 IMPLANT
ELECT REM PT RETURN 9FT ADLT (ELECTROSURGICAL) ×2
ELECTRODE BLDE 4.0 EZ CLN MEGD (MISCELLANEOUS) IMPLANT
ELECTRODE REM PT RTRN 9FT ADLT (ELECTROSURGICAL) ×1 IMPLANT
EVACUATOR SILICONE 100CC (DRAIN) IMPLANT
FILTER STRAW FLUID ASPIR (MISCELLANEOUS) ×2 IMPLANT
GAUZE SPONGE 4X4 16PLY XRAY LF (GAUZE/BANDAGES/DRESSINGS) ×4 IMPLANT
GLOVE BIO SURGEON STRL SZ7 (GLOVE) ×2 IMPLANT
GLOVE BIO SURGEON STRL SZ8 (GLOVE) ×2 IMPLANT
GLOVE BIOGEL PI IND STRL 7.0 (GLOVE) ×1 IMPLANT
GLOVE BIOGEL PI IND STRL 8 (GLOVE) ×1 IMPLANT
GLOVE BIOGEL PI INDICATOR 7.0 (GLOVE) ×1
GLOVE BIOGEL PI INDICATOR 8 (GLOVE) ×1
GOWN STRL NON-REIN LRG LVL3 (GOWN DISPOSABLE) ×2 IMPLANT
GOWN STRL REIN XL XLG (GOWN DISPOSABLE) ×4 IMPLANT
IV CATH 14GX2 1/4 (CATHETERS) ×2 IMPLANT
KIT BASIN OR (CUSTOM PROCEDURE TRAY) ×2 IMPLANT
KIT POSITION SURG JACKSON T1 (MISCELLANEOUS) ×2 IMPLANT
KIT ROOM TURNOVER OR (KITS) ×2 IMPLANT
NEEDLE 18GX1X1/2 (RX/OR ONLY) (NEEDLE) ×2 IMPLANT
NEEDLE 22X1 1/2 (OR ONLY) (NEEDLE) ×2 IMPLANT
NEEDLE HYPO 25GX1X1/2 BEV (NEEDLE) ×2 IMPLANT
NEEDLE SPNL 18GX3.5 QUINCKE PK (NEEDLE) ×4 IMPLANT
NS IRRIG 1000ML POUR BTL (IV SOLUTION) ×2 IMPLANT
OIL CARTRIDGE MAESTRO DRILL (MISCELLANEOUS) ×2
PACK LAMINECTOMY ORTHO (CUSTOM PROCEDURE TRAY) ×2 IMPLANT
PACK UNIVERSAL I (CUSTOM PROCEDURE TRAY) ×2 IMPLANT
PAD ARMBOARD 7.5X6 YLW CONV (MISCELLANEOUS) ×4 IMPLANT
PATTIES SURGICAL .5 X.5 (GAUZE/BANDAGES/DRESSINGS) IMPLANT
PATTIES SURGICAL .5 X1 (DISPOSABLE) ×2 IMPLANT
SPONGE GAUZE 4X4 12PLY (GAUZE/BANDAGES/DRESSINGS) ×2 IMPLANT
SPONGE INTESTINAL PEANUT (DISPOSABLE) ×2 IMPLANT
SPONGE SURGIFOAM ABS GEL 100 (HEMOSTASIS) ×2 IMPLANT
STRIP CLOSURE SKIN 1/2X4 (GAUZE/BANDAGES/DRESSINGS) IMPLANT
SURGIFLO TRUKIT (HEMOSTASIS) IMPLANT
SUT MNCRL AB 4-0 PS2 18 (SUTURE) ×2 IMPLANT
SUT VIC AB 0 CT1 18XCR BRD 8 (SUTURE) IMPLANT
SUT VIC AB 0 CT1 27 (SUTURE)
SUT VIC AB 0 CT1 27XBRD ANBCTR (SUTURE) IMPLANT
SUT VIC AB 0 CT1 8-18 (SUTURE)
SUT VIC AB 1 CT1 18XCR BRD 8 (SUTURE) ×1 IMPLANT
SUT VIC AB 1 CT1 8-18 (SUTURE) ×1
SUT VIC AB 2-0 CT2 18 VCP726D (SUTURE) ×2 IMPLANT
SYR 20CC LL (SYRINGE) IMPLANT
SYR BULB IRRIGATION 50ML (SYRINGE) ×2 IMPLANT
SYR CONTROL 10ML LL (SYRINGE) ×4 IMPLANT
SYR TB 1ML 26GX3/8 SAFETY (SYRINGE) ×4 IMPLANT
SYR TB 1ML LUER SLIP (SYRINGE) ×4 IMPLANT
TAPE CLOTH SURG 4X10 WHT LF (GAUZE/BANDAGES/DRESSINGS) ×2 IMPLANT
TOWEL OR 17X24 6PK STRL BLUE (TOWEL DISPOSABLE) ×2 IMPLANT
TOWEL OR 17X26 10 PK STRL BLUE (TOWEL DISPOSABLE) ×2 IMPLANT
WATER STERILE IRR 1000ML POUR (IV SOLUTION) IMPLANT
YANKAUER SUCT BULB TIP NO VENT (SUCTIONS) ×2 IMPLANT

## 2013-02-28 NOTE — Anesthesia Preprocedure Evaluation (Addendum)
Anesthesia Evaluation  Patient identified by MRN, date of birth, ID band Patient awake    Reviewed: Allergy & Precautions, H&P , NPO status , Patient's Chart, lab work & pertinent test results, reviewed documented beta blocker date and time   History of Anesthesia Complications (+) PONV and history of anesthetic complications  Airway Mallampati: II TM Distance: >3 FB Neck ROM: Full    Dental  (+) Teeth Intact, Poor Dentition, Dental Advisory Given and Caps   Pulmonary COPD COPD inhaler,    Pulmonary exam normal       Cardiovascular hypertension, Pt. on medications and Pt. on home beta blockers     Neuro/Psych  Headaches, PSYCHIATRIC DISORDERS Depression    GI/Hepatic Neg liver ROS, GERD-  Medicated,  Endo/Other  negative endocrine ROS  Renal/GU negative Renal ROS     Musculoskeletal  (+) Fibromyalgia -, narcotic dependent  Abdominal   Peds  Hematology   Anesthesia Other Findings   Reproductive/Obstetrics                         Anesthesia Physical Anesthesia Plan  ASA: III  Anesthesia Plan: General   Post-op Pain Management:    Induction: Intravenous  Airway Management Planned: Oral ETT  Additional Equipment:   Intra-op Plan:   Post-operative Plan: Extubation in OR  Informed Consent: I have reviewed the patients History and Physical, chart, labs and discussed the procedure including the risks, benefits and alternatives for the proposed anesthesia with the patient or authorized representative who has indicated his/her understanding and acceptance.   Dental advisory given  Plan Discussed with: Anesthesiologist, Surgeon and CRNA  Anesthesia Plan Comments:        Anesthesia Quick Evaluation

## 2013-02-28 NOTE — H&P (Signed)
PREOPERATIVE H&P  Chief Complaint: left leg pain  HPI: Annette Ramirez is a 67 y.o. female who presents with ongoing pain in the left leg. Pain has been present for 2 years, 7/10. Patient also notes weakness. MRI = severe compression of the left L5 nerve.  Past Medical History  Diagnosis Date  . COPD (chronic obstructive pulmonary disease)   . GERD (gastroesophageal reflux disease)   . Hypertension   . Fibromyalgia   . Depression   . Osteoporosis   . PONV (postoperative nausea and vomiting)   . Headache(784.0)     sinus H/A, Hx migraines   Past Surgical History  Procedure Laterality Date  . Tonsillectomy  1950  . Tubal ligation  1972  . Colonoscopy w/ biopsies and polypectomy  2012  . Vaginal hysterectomy  1975    abdominal  . Appendectomy     History   Social History  . Marital Status: Married    Spouse Name: N/A    Number of Children: N/A  . Years of Education: N/A   Social History Main Topics  . Smoking status: Current Every Day Smoker -- 0.50 packs/day for 40 years    Types: Cigarettes  . Smokeless tobacco: Never Used     Comment: started smoking eletronic cigarettes  . Alcohol Use: Yes     Comment: occasionally  . Drug Use: No  . Sexual Activity: None   Other Topics Concern  . None   Social History Narrative  . None   Family History  Problem Relation Age of Onset  . Alzheimer's disease Mother   . Prostate cancer      grandfather  . Stroke      grandparent   Allergies  Allergen Reactions  . Amoxicillin-Pot Clavulanate     REACTION: nausea and abdominal pain  . Aspirin     REACTION: upset stomach  . Codeine     REACTION: nausea   Prior to Admission medications   Medication Sig Start Date End Date Taking? Authorizing Provider  albuterol (PROVENTIL HFA;VENTOLIN HFA) 108 (90 BASE) MCG/ACT inhaler Inhale 2 puffs into the lungs every 6 (six) hours as needed for wheezing.   Yes Historical Provider, MD  ALPRAZolam (XANAX) 0.25 MG tablet Take 0.25  mg by mouth 3 (three) times daily as needed for anxiety.   Yes Historical Provider, MD  atenolol (TENORMIN) 100 MG tablet Take 100 mg by mouth 2 (two) times daily.   Yes Historical Provider, MD  citalopram (CELEXA) 20 MG tablet TAKE 1 TABLET BY MOUTH ONCE DAILY 02/27/13  Yes Agapito Games, MD  cyclobenzaprine (FLEXERIL) 10 MG tablet Take 10 mg by mouth 3 (three) times daily as needed for muscle spasms (back pain). 01/15/13  Yes Sean Hommel, DO  HYDROcodone-acetaminophen (NORCO/VICODIN) 5-325 MG per tablet Take 1 tablet by mouth every 8 (eight) hours as needed for pain. 01/15/13  Yes Sean Hommel, DO  losartan-hydrochlorothiazide (HYZAAR) 100-25 MG per tablet Take 1 tablet by mouth daily.   Yes Historical Provider, MD  omeprazole (PRILOSEC) 40 MG capsule Take 1 capsule (40 mg total) by mouth daily. 01/15/13  Yes Agapito Games, MD  oxyCODONE-acetaminophen (PERCOCET/ROXICET) 5-325 MG per tablet Take 1-2 tablets by mouth every 8 (eight) hours as needed for pain.   Yes Historical Provider, MD  pentazocine-naloxone (TALWIN NX) 50-0.5 MG per tablet Take 0.5-1 tablets by mouth every 6 (six) hours as needed for pain.   Yes Historical Provider, MD  pregabalin (LYRICA) 75 MG capsule Take 75  mg by mouth 2 (two) times daily.   Yes Historical Provider, MD  QUEtiapine (SEROQUEL) 25 MG tablet Take 25 mg by mouth at bedtime.   Yes Historical Provider, MD  AMBULATORY NON FORMULARY MEDICATION Medication Name: Shingles vaccine IM x 1 07/12/11   Agapito Games, MD  meloxicam (MOBIC) 15 MG tablet Take 15 mg by mouth daily as needed for pain.    Historical Provider, MD     All other systems have been reviewed and were otherwise negative with the exception of those mentioned in the HPI and as above.  Physical Exam: Filed Vitals:   02/28/13 0643  BP: 122/71  Pulse: 70  Temp: 97 F (36.1 C)  Resp: 16    General: Alert, no acute distress Cardiovascular: No pedal edema Respiratory: No cyanosis, no  use of accessory musculature GI: No organomegaly, abdomen is soft and non-tender Skin: No lesions in the area of chief complaint Neurologic: Sensation intact distally Psychiatric: Patient is competent for consent with normal mood and affect Lymphatic: No axillary or cervical lymphadenopathy  MUSCULOSKELETAL: + SLR on left  Assessment/Plan: Left leg pain Plan for Procedure(s): LUMBAR LAMINECTOMY/DECOMPRESSION MICRODISCECTOMY/Lumbar 4-5 decompression   Emilee Hero, MD 02/28/2013 8:02 AM

## 2013-02-28 NOTE — Preoperative (Signed)
Beta Blockers   Reason not to administer Beta Blockers:Not Applicable 

## 2013-02-28 NOTE — Anesthesia Postprocedure Evaluation (Signed)
Anesthesia Post Note  Patient: Annette Ramirez  Procedure(s) Performed: Procedure(s) (LRB): LUMBAR LAMINECTOMY/DECOMPRESSION MICRODISCECTOMY/Lumbar 4-5 decompression (N/A)  Anesthesia type: general  Patient location: PACU  Post pain: Pain level controlled  Post assessment: Patient's Cardiovascular Status Stable  Last Vitals:  Filed Vitals:   02/28/13 1200  BP: 99/53  Pulse: 74  Temp:   Resp: 18    Post vital signs: Reviewed and stable  Level of consciousness: sedated  Complications: No apparent anesthesia complications

## 2013-02-28 NOTE — Plan of Care (Signed)
Problem: Consults Goal: Diagnosis - Spinal Surgery Outcome: Completed/Met Date Met:  02/28/13 Lumbar Laminectomy (Complex)--see op note

## 2013-02-28 NOTE — Anesthesia Procedure Notes (Signed)
Procedure Name: Intubation Date/Time: 02/28/2013 8:55 AM Performed by: Brien Mates D Pre-anesthesia Checklist: Patient identified, Emergency Drugs available, Suction available, Patient being monitored and Timeout performed Patient Re-evaluated:Patient Re-evaluated prior to inductionOxygen Delivery Method: Circle system utilized Preoxygenation: Pre-oxygenation with 100% oxygen Intubation Type: IV induction Ventilation: Mask ventilation without difficulty Laryngoscope Size: Miller and 2 Grade View: Grade I Tube type: Oral Tube size: 7.5 mm Number of attempts: 1 Airway Equipment and Method: Stylet Placement Confirmation: ETT inserted through vocal cords under direct vision,  positive ETCO2 and breath sounds checked- equal and bilateral Secured at: 22 cm Tube secured with: Tape Dental Injury: Teeth and Oropharynx as per pre-operative assessment

## 2013-02-28 NOTE — Transfer of Care (Signed)
Immediate Anesthesia Transfer of Care Note  Patient: Annette Ramirez  Procedure(s) Performed: Procedure(s) with comments: LUMBAR LAMINECTOMY/DECOMPRESSION MICRODISCECTOMY/Lumbar 4-5 decompression (N/A) - Lumbar 4-5 decompression  Patient Location: PACU  Anesthesia Type:General  Level of Consciousness: sedated  Airway & Oxygen Therapy: Patient Spontanous Breathing and Patient connected to face mask oxygen  Post-op Assessment: Report given to PACU RN and Post -op Vital signs reviewed and stable  Post vital signs: Reviewed and stable  Complications: No apparent anesthesia complications

## 2013-03-01 ENCOUNTER — Encounter (HOSPITAL_COMMUNITY): Payer: Self-pay | Admitting: Orthopedic Surgery

## 2013-03-01 NOTE — Progress Notes (Signed)
Patient reports complete resolution of left leg pain. Minimal back pain.   BP 100/48  Pulse 73  Temp(Src) 98.4 F (36.9 C) (Oral)  Resp 18  SpO2 98%  NVI Dressing CDI  S/p L4/5 decompression - OK to d/c home today - f/u in office in 2 weeks

## 2013-03-01 NOTE — Progress Notes (Signed)
Nelta Numbers to be D/C'd Home per MD order. Discussed with the patient and all questions fully answered.    Medication List    STOP taking these medications       cyclobenzaprine 10 MG tablet  Commonly known as:  FLEXERIL     HYDROcodone-acetaminophen 5-325 MG per tablet  Commonly known as:  NORCO/VICODIN     meloxicam 15 MG tablet  Commonly known as:  MOBIC     pentazocine-naloxone 50-0.5 MG per tablet  Commonly known as:  TALWIN NX     pregabalin 75 MG capsule  Commonly known as:  LYRICA      TAKE these medications       albuterol 108 (90 BASE) MCG/ACT inhaler  Commonly known as:  PROVENTIL HFA;VENTOLIN HFA  Inhale 2 puffs into the lungs every 6 (six) hours as needed for wheezing.     ALPRAZolam 0.25 MG tablet  Commonly known as:  XANAX  Take 0.25 mg by mouth 3 (three) times daily as needed for anxiety.     AMBULATORY NON FORMULARY MEDICATION  Medication Name: Shingles vaccine IM x 1     atenolol 100 MG tablet  Commonly known as:  TENORMIN  Take 100 mg by mouth 2 (two) times daily.     citalopram 20 MG tablet  Commonly known as:  CELEXA  TAKE 1 TABLET BY MOUTH ONCE DAILY     losartan-hydrochlorothiazide 100-25 MG per tablet  Commonly known as:  HYZAAR  Take 1 tablet by mouth daily.     omeprazole 40 MG capsule  Commonly known as:  PRILOSEC  Take 1 capsule (40 mg total) by mouth daily.     oxyCODONE-acetaminophen 5-325 MG per tablet  Commonly known as:  PERCOCET/ROXICET  Take 1-2 tablets by mouth every 8 (eight) hours as needed for pain.     QUEtiapine 25 MG tablet  Commonly known as:  SEROQUEL  Take 25 mg by mouth at bedtime.        VVS, Skin clean, dry and intact without evidence of skin break down, no evidence of skin tears noted.  IV catheter discontinued intact. Site without signs and symptoms of complications. Dressing and pressure applied.  An After Visit Summary was printed and given to the patient.  Patient escorted via WC, and D/C home  via private auto.  Kai Levins  03/01/2013 9:59 AM

## 2013-03-01 NOTE — Op Note (Signed)
NAMECAYENNE, BREAULT              ACCOUNT NO.:  [id number]  MEDICAL RECORD NO.:  [id number]  LOCATION:  5N26C                        FACILITY:  [facility]  PHYSICIAN:  Estill Bamberg, MD      DATE OF BIRTH:  22-Jun-1945  DATE OF PROCEDURE:  02/28/2013                              OPERATIVE REPORT   PREOPERATIVE DIAGNOSES: 1. Left-sided L5 radiculopathy. 2. Large left-sided L4-5 facet cyst, causing significant compression     of the traversing L5 nerve on the left.  POSTOPERATIVE DIAGNOSES: 1. Left-sided L5 radiculopathy. 2. Large left-sided L4-5 facet cyst, causing significant compression     of the traversing L5 nerve on the left.  PROCEDURE:  L4-5 decompression with bilateral partial facetectomy, with removal of large left-sided L4-5 facet cyst.  SURGEON:  Estill Bamberg, MD  ASSISTANT:  Jason Coop, PA-C  ANESTHESIA:  General endotracheal anesthesia.  COMPLICATIONS:  None.  DISPOSITION:  Stable.  ESTIMATED BLOOD LOSS:  Minimal.  INDICATIONS FOR PROCEDURE:  Briefly, Ms. Delahunt is a pleasant 67 year old female who did present to me on February 09, 2013, with severe pain in her left leg.  The pain has been present for 2 years.  She did note weakness and she did feel that her pain was affecting her activities of daily living.  An MRI did clearly reveal a large left-sided L4-5 facet cyst.  Given her subjective and objective weakness in addition to the cyst noted, we did discuss a decompressive procedure.  The patient did fully understand the risks and limitations of the procedure as outlined in my preoperative note.  OPERATIVE DETAILS:  On February 28, 2013, the patient was brought to the surgery and general endotracheal anesthesia was administered.  The patient was placed prone on a well-padded flat Jackson bed with a spinal frame.  Antibiotics were given and a time-out procedure was performed. The back was prepped and draped in the usual sterile fashion.  I then made an  incision over the midline, overlying the L4-5 interspace.  A lateral intraoperative radiograph did confirm the appropriate operative level.  The lamina of L4 and L5 were subperiosteally exposed.  I then removed the spinous process of L4.  A 4 mm high-speed burr was used to remove the central aspect of the L4 lamina.  A partial facetectomy was then performed on the right side.  I then performed a partial facetectomy on the left.  Of note, the L4-5 facet cyst was readily identified, and was clearly noted to be causing substantial compression of the traversing L5 nerve on the left.  I was able to successfully remove a substantial portion of the cyst.  Of note, the anterior portion of the cyst was entirely adherent to the dura underlying it.  I therefore did not attempt to remove this portion of the wall of the cyst, as I did not feel that the risk associated with removing it was justified, and I did not see any gain in doing so.  Once the partial facetectomy was completed and the cyst was removed, I was able to use a Woodson elevator to palpate the pedicles of L4 and of L5.  The traversing L5 nerve was identified and noted to  be free of any tension or pressure.  I was very pleased with the decompression performed.  I then copiously irrigated the wound.  I then introduced 40 mg of Depo- Medrol about the epidural space.  I then controlled epidural bleeding using bipolar electrocautery in addition to Gelfoam with thrombin.  I then closed the fascia using #1 Vicryl.  The subcutaneous layer was then closed using 2-0 Vicryl and the skin was then closed using 3-0 Monocryl. Benzoin and Steri-Strips were applied followed by sterile dressing.  All instrument counts were correct at the termination of the procedure.  Of note, Jason Coop was my assistant throughout the entirety of the procedure, and did aide in essential retraction and suctioning needed throughout the entirety of the  surgery.     Estill Bamberg, MD     MD/MEDQ  D:  02/28/2013  T:  03/01/2013  Job:  [id number]  cc:   Monica Becton, MD Dr. Luane School

## 2013-03-07 NOTE — Discharge Summary (Signed)
Patient ID: Annette Ramirez MRN: [id number] DOB/AGE: Oct 01, 1945 67 y.o.  Admit date: 02/28/2013 Discharge date: 03/01/2013  Admission Diagnoses: Radiculopathy  Discharge Diagnoses:  Same  Past Medical History  Diagnosis Date  . COPD (chronic obstructive pulmonary disease)   . GERD (gastroesophageal reflux disease)   . Hypertension   . Fibromyalgia   . Depression   . Osteoporosis   . PONV (postoperative nausea and vomiting)   . Family history of anesthesia complication     "granddaughter has PONV" (02/28/2013)  . Exertional shortness of breath   . H/O hiatal hernia   . Sinus headache   . Migraines     "in the past; they've stopped" (02/28/2013)  . Anxiety     Surgeries: Procedure(s): LUMBAR LAMINECTOMY/DECOMPRESSION MICRODISCECTOMY/Lumbar 4-5 decompression on 02/28/2013  Discharged Condition: Improved  Hospital Course: Annette Ramirez is an 67 y.o. female who was admitted 02/28/2013 for operative treatment of Radiculopathy. Patient has severe unremitting pain that affects sleep, daily activities, and work/hobbies. After pre-op clearance the patient was taken to the operating room on 02/28/2013 and underwent  Procedure(s): LUMBAR LAMINECTOMY/DECOMPRESSION MICRODISCECTOMY/Lumbar 4-5 decompression.    Patient was given perioperative antibiotics:  Anti-infectives   Start     Dose/Rate Route Frequency Ordered Stop   02/28/13 2030  vancomycin (VANCOCIN) IVPB 1000 mg/200 mL premix     1,000 mg 200 mL/hr over 60 Minutes Intravenous  Once 02/28/13 1335 02/28/13 2127   02/28/13 0600  vancomycin (VANCOCIN) IVPB 1000 mg/200 mL premix     1,000 mg 200 mL/hr over 60 Minutes Intravenous On call to O.R. 02/27/13 1309 02/28/13 0835       Patient was given sequential compression devices, early ambulation to prevent DVT.  Patient benefited maximally from hospital stay and there were no complications.    Recent vital signs: BP 100/48  Pulse 73  Temp(Src) 98.4 F (36.9 C) (Oral)   Resp 18  SpO2 98%  Discharge Medications:     Medication List    STOP taking these medications       cyclobenzaprine 10 MG tablet  Commonly known as:  FLEXERIL     HYDROcodone-acetaminophen 5-325 MG per tablet  Commonly known as:  NORCO/VICODIN     meloxicam 15 MG tablet  Commonly known as:  MOBIC     pentazocine-naloxone 50-0.5 MG per tablet  Commonly known as:  TALWIN NX     pregabalin 75 MG capsule  Commonly known as:  LYRICA      TAKE these medications       albuterol 108 (90 BASE) MCG/ACT inhaler  Commonly known as:  PROVENTIL HFA;VENTOLIN HFA  Inhale 2 puffs into the lungs every 6 (six) hours as needed for wheezing.     ALPRAZolam 0.25 MG tablet  Commonly known as:  XANAX  Take 0.25 mg by mouth 3 (three) times daily as needed for anxiety.     AMBULATORY NON FORMULARY MEDICATION  Medication Name: Shingles vaccine IM x 1     atenolol 100 MG tablet  Commonly known as:  TENORMIN  Take 100 mg by mouth 2 (two) times daily.     citalopram 20 MG tablet  Commonly known as:  CELEXA  TAKE 1 TABLET BY MOUTH ONCE DAILY     losartan-hydrochlorothiazide 100-25 MG per tablet  Commonly known as:  HYZAAR  Take 1 tablet by mouth daily.     omeprazole 40 MG capsule  Commonly known as:  PRILOSEC  Take 1 capsule (40 mg total) by mouth  daily.     oxyCODONE-acetaminophen 5-325 MG per tablet  Commonly known as:  PERCOCET/ROXICET  Take 1-2 tablets by mouth every 8 (eight) hours as needed for pain.     QUEtiapine 25 MG tablet  Commonly known as:  SEROQUEL  Take 25 mg by mouth at bedtime.        Diagnostic Studies: Dg Chest 2 View  02/22/2013   CLINICAL DATA:  Preop lumbar laminectomy. Cough, smoker  EXAM: CHEST  2 VIEW  COMPARISON:  11/25/2008  FINDINGS: Scarring in the lingula. Right lung is clear. Heart is normal size. No effusions or acute bony abnormality.  IMPRESSION: No active cardiopulmonary disease.   Electronically Signed   By: Charlett Nose M.D.   On:  02/22/2013 15:51   Dg Lumbar Spine 2-3 Views  02/28/2013   CLINICAL DATA:  Intraoperative imaging, L4-5 microdiscectomy  EXAM: LUMBAR SPINE - 2-3 VIEW  COMPARISON:  MRI 01/30/2013  FINDINGS: First intraoperative lateral image demonstrates posterior needles directed at the L4 spinous process and the S 1 vertebral body.  Second lateral intraoperative image demonstrates posterior surgical instruments at the L4-5 level.  IMPRESSION: Intraoperative localization as above   Electronically Signed   By: Charlett Nose M.D.   On: 02/28/2013 09:49    Disposition: 01-Home or Self Care      Discharge Orders   Future Orders Complete By Expires   Discharge patient  As directed       Patient reports complete resolution of left leg pain. Minimal back pain.   BP 100/48  Pulse 73  Temp(Src) 98.4 F (36.9 C) (Oral)  Resp 18  SpO2 98%  NVI  Dressing CDI   S/p L4/5 decompression  - OK to d/c home today  - f/u in office in 2 weeks - Scripts provided   Signed: Georga Bora 03/07/2013, 1:57 PM

## 2013-05-02 ENCOUNTER — Ambulatory Visit (INDEPENDENT_AMBULATORY_CARE_PROVIDER_SITE_OTHER): Payer: Medicare Other | Admitting: Physician Assistant

## 2013-05-02 ENCOUNTER — Encounter: Payer: Self-pay | Admitting: Physician Assistant

## 2013-05-02 VITALS — BP 109/68 | HR 77 | Temp 98.8°F | Wt 148.0 lb

## 2013-05-02 DIAGNOSIS — J111 Influenza due to unidentified influenza virus with other respiratory manifestations: Secondary | ICD-10-CM

## 2013-05-02 DIAGNOSIS — R6889 Other general symptoms and signs: Secondary | ICD-10-CM

## 2013-05-02 DIAGNOSIS — J441 Chronic obstructive pulmonary disease with (acute) exacerbation: Secondary | ICD-10-CM

## 2013-05-02 DIAGNOSIS — J101 Influenza due to other identified influenza virus with other respiratory manifestations: Secondary | ICD-10-CM

## 2013-05-02 LAB — POC INFLUENZA A&B (BINAX/QUICKVUE)
Influenza A, POC: POSITIVE
Influenza B, POC: NEGATIVE

## 2013-05-02 MED ORDER — PREDNISONE 20 MG PO TABS: ORAL_TABLET | ORAL | Status: DC

## 2013-05-02 MED ORDER — OSELTAMIVIR PHOSPHATE 75 MG PO CAPS: 75.0000 mg | ORAL_CAPSULE | Freq: Two times a day (BID) | ORAL | Status: DC

## 2013-05-02 MED ORDER — DOXYCYCLINE HYCLATE 100 MG PO CAPS: 100.0000 mg | ORAL_CAPSULE | Freq: Two times a day (BID) | ORAL | Status: DC

## 2013-05-02 MED ORDER — HYDROCODONE-HOMATROPINE 5-1.5 MG/5ML PO SYRP: 5.0000 mL | ORAL_SOLUTION | Freq: Every evening | ORAL | Status: DC | PRN

## 2013-05-02 MED ORDER — ALBUTEROL SULFATE HFA 108 (90 BASE) MCG/ACT IN AERS: 2.0000 | INHALATION_SPRAY | Freq: Four times a day (QID) | RESPIRATORY_TRACT | Status: DC | PRN

## 2013-05-02 NOTE — Patient Instructions (Addendum)
Positive Influenza type A.   Influenza A (H1N1) H1N1 formerly called "swine flu" is a new influenza virus causing sickness in people. The H1N1 virus is different from seasonal influenza viruses. However, the H1N1 symptoms are similar to seasonal influenza and it is spread from person to person. You may be at higher risk for serious problems if you have underlying serious medical conditions. The CDC and the [employer] are following reported cases around the world. CAUSES   The flu is thought to spread mainly person-to-person through coughing or sneezing of infected people.  A person may become infected by touching something with the virus on it and then touching their mouth or nose. SYMPTOMS   Fever.  Headache.  Tiredness.  Cough.  Sore throat.  Runny or stuffy nose.  Body aches.  Diarrhea and vomiting These symptoms are referred to as "flu-like symptoms." A lot of different illnesses, including the common cold, may have similar symptoms. DIAGNOSIS   There are tests that can tell if you have the H1N1 virus.  Confirmed cases of H1N1 will be reported to the state or local health department.  A doctor's exam may be needed to tell whether you have an infection that is a complication of the flu. HOME CARE INSTRUCTIONS   Stay informed. Visit the [facility] website for current recommendations. Visit [employer]. You may also call 1-800-CDC-INFO (616-448-2930).  Get help early if you develop any of the above symptoms.  If you are at high risk from complications of the flu, talk to your caregiver as soon as you develop flu-like symptoms. Those at higher risk for complications include:  People 65 years or older.  People with chronic medical conditions.  Pregnant women.  Young children.  Your caregiver may recommend antiviral medicine to help treat the flu.  If you get the flu, get plenty of rest, drink enough water and fluids to keep your urine clear or  pale yellow, and avoid using alcohol or tobacco.  You may take over-the-counter medicine to relieve the symptoms of the flu if your caregiver approves. (Never give aspirin to children or teenagers who have flu-like symptoms, particularly fever). TREATMENT  If you do get sick, antiviral drugs are available. These drugs can make your illness milder and make you feel better faster. Treatment should start soon after illness starts. It is only effective if taken within the first day of becoming ill. Only your caregiver can prescribe antiviral medication.  PREVENTION   Cover your nose and mouth with a tissue or your arm when you cough or sneeze. Throw the tissue away.  Wash your hands often with soap and warm water, especially after you cough or sneeze. Alcohol-based cleaners are also effective against germs.  Avoid touching your eyes, nose or mouth. This is one way germs spread.  Try to avoid contact with sick people. Follow public health advice regarding school closures. Avoid crowds.  Stay home if you get sick. Limit contact with others to keep from infecting them. People infected with the H1N1 virus may be able to infect others anywhere from 1 day before feeling sick to 5-7 days after getting flu symptoms.  An H1N1 vaccine is available to help protect against the virus. In addition to the H1N1 vaccine, you will need to be vaccinated for seasonal influenza. The H1N1 and seasonal vaccines may be given on the same day. The CDC especially recommends the H1N1 vaccine for:  Pregnant women.  People who live with or care for children younger  than 67 months of age.  Health care and emergency services personnel.  Persons between the ages of 78 months through 70 years of age.  People from ages 24 through 60 years who are at higher risk for H1N1 because of chronic health disorders or immune system problems. FACEMASKS In community and home settings, the use of facemasks and N95 respirators are not  normally recommended. In certain circumstances, a facemask or N95 respirator may be used for persons at increased risk of severe illness from influenza. Your caregiver can give additional recommendations for facemask use. IN CHILDREN, EMERGENCY WARNING SIGNS THAT NEED URGENT MEDICAL CARE:  Fast breathing or trouble breathing.  Bluish skin color.  Not drinking enough fluids.  Not waking up or not interacting normally.  Being so fussy that the child does not want to be held.  Your child has an oral temperature above 102 F (38.9 C), not controlled by medicine.  Your baby is older than 3 months with a rectal temperature of 102 F (38.9 C) or higher.  Your baby is 48 months old or younger with a rectal temperature of 100.4 F (38 C) or higher.  Flu-like symptoms improve but then return with fever and worse cough. IN ADULTS, EMERGENCY WARNING SIGNS THAT NEED URGENT MEDICAL CARE:  Difficulty breathing or shortness of breath.  Pain or pressure in the chest or abdomen.  Sudden dizziness.  Confusion.  Severe or persistent vomiting.  Bluish color.  You have a oral temperature above 102 F (38.9 C), not controlled by medicine.  Flu-like symptoms improve but return with fever and worse cough. SEEK IMMEDIATE MEDICAL CARE IF:  You or someone you know is experiencing any of the above symptoms. When you arrive at the emergency center, report that you think you have the flu. You may be asked to wear a mask and/or sit in a secluded area to protect others from getting sick. MAKE SURE YOU:   Understand these instructions.  Will watch your condition.  Will get help right away if you are not doing well or get worse. Some of this information courtesy of the CDC.  Document Released: 10/06/2007 Document Revised: 07/12/2011 Document Reviewed: 10/06/2007 [facility] Patient Information 2014 [place], [place].  Acute Bronchitis Bronchitis is inflammation of the airways that extend from the  windpipe into the lungs (bronchi). The inflammation often causes mucus to develop. This leads to a cough, which is the most common symptom of bronchitis.  In acute bronchitis, the condition usually develops suddenly and goes away over time, usually in a couple weeks. Smoking, allergies, and asthma can make bronchitis worse. Repeated episodes of bronchitis may cause further lung problems.  CAUSES Acute bronchitis is most often caused by the same virus that causes a cold. The virus can spread from person to person (contagious).  SIGNS AND SYMPTOMS   Cough.   Fever.   Coughing up mucus.   Body aches.   Chest congestion.   Chills.   Shortness of breath.   Sore throat.  DIAGNOSIS  Acute bronchitis is usually diagnosed through a physical exam. Tests, such as chest X-rays, are sometimes done to rule out other conditions.  TREATMENT  Acute bronchitis usually goes away in a couple weeks. Often times, no medical treatment is necessary. Medicines are sometimes given for relief of fever or cough. Antibiotics are usually not needed but may be prescribed in certain situations. In some cases, an inhaler may be recommended to help reduce shortness of breath and control the cough. A  cool mist vaporizer may also be used to help thin bronchial secretions and make it easier to clear the chest.  HOME CARE INSTRUCTIONS  Get plenty of rest.   Drink enough fluids to keep your urine clear or pale yellow (unless you have a medical condition that requires fluid restriction). Increasing fluids may help thin your secretions and will prevent dehydration.   Only take over-the-counter or prescription medicines as directed by your health care provider.   Avoid smoking and secondhand smoke. Exposure to cigarette smoke or irritating chemicals will make bronchitis worse. If you are a smoker, consider using nicotine gum or skin patches to help control withdrawal symptoms. Quitting smoking will help your lungs  heal faster.   Reduce the chances of another bout of acute bronchitis by washing your hands frequently, avoiding people with cold symptoms, and trying not to touch your hands to your mouth, nose, or eyes.   Follow up with your health care provider as directed.  SEEK MEDICAL CARE IF: Your symptoms do not improve after 1 week of treatment.  SEEK IMMEDIATE MEDICAL CARE IF:  You develop an increased fever or chills.   You have chest pain.   You have severe shortness of breath.  You have bloody sputum.   You develop dehydration.  You develop fainting.  You develop repeated vomiting.  You develop a severe headache. MAKE SURE YOU:   Understand these instructions.  Will watch your condition.  Will get help right away if you are not doing well or get worse. Document Released: 05/27/2004 Document Revised: 12/20/2012 Document Reviewed: 10/10/2012 [facility] Patient Information 2014 [place], [place].

## 2013-05-02 NOTE — Progress Notes (Signed)
   Subjective:    Patient ID: Annette Ramirez, female    DOB: [birth year], 67 y.o.   MRN: [id number]  HPI Patient presents to the clinic with 4 days of fatigue, weakness, cough, and fever. Symptoms started with headache and sinus pressure but progress into a productive cough and wheezing. Pt dx of COPD but not being treated or does it given problems regularly. She is now using albuterol 2-3 times a day. She is coughing so much that her ribs hurt and sputum is yellow. Temperature as high as 101.9 on Sunday but been taking theraflu since and not ran temperature. She has no energy. Denies any n/v/d. Denies any ST or ear pain. Continues to smoke.     Review of Systems     Objective:   Physical Exam  Constitutional: She is oriented to person, place, and time. She appears well-developed and well-nourished.  HENT:  Head: Normocephalic and atraumatic.  Right Ear: External ear normal.  Left Ear: External ear normal.  Nose: Nose normal.  Mouth/Throat: Oropharynx is clear and moist.  Eyes: Conjunctivae are normal. Right eye exhibits no discharge. Left eye exhibits no discharge.  Neck: Normal range of motion. Neck supple.  Cardiovascular: Normal rate, regular rhythm and normal heart sounds.   Pulmonary/Chest:  Decreased effort. Wheezing and rhonchi heard throughout both lungs.   Lymphadenopathy:    She has no cervical adenopathy.  Neurological: She is alert and oriented to person, place, and time.  Skin: Skin is warm and dry.  Psychiatric: She has a normal mood and affect. Her behavior is normal.          Assessment & Plan:  Influenza A/COPD exacerbation- discussed with pt that since she is not in window of 48 hours then tamiflu would not be very beneficial. I did give tamiflu for her to use if husband developed symptoms. Treated with prednisone and zpak. Hycodan for cough. REst and hydration discussed. Gave HO on flu.  Continue albuterol inhaler every 4-6 hours as needed. Call if not  improving.

## 2013-05-07 ENCOUNTER — Other Ambulatory Visit: Payer: Self-pay | Admitting: Family Medicine

## 2013-06-18 ENCOUNTER — Other Ambulatory Visit: Payer: Self-pay | Admitting: Family Medicine

## 2013-06-20 ENCOUNTER — Other Ambulatory Visit: Payer: Self-pay | Admitting: *Deleted

## 2013-06-20 ENCOUNTER — Other Ambulatory Visit: Payer: Self-pay | Admitting: Family Medicine

## 2013-06-20 MED ORDER — PENTAZOCINE-NALOXONE HCL 50-0.5 MG PO TABS: ORAL_TABLET | ORAL | Status: DC

## 2013-07-15 ENCOUNTER — Other Ambulatory Visit: Payer: Self-pay | Admitting: Family Medicine

## 2013-07-16 NOTE — Telephone Encounter (Signed)
Must make appointment 

## 2013-07-31 ENCOUNTER — Other Ambulatory Visit: Payer: Self-pay | Admitting: Family Medicine

## 2013-07-31 NOTE — Telephone Encounter (Signed)
Are you ok with filling these. You have not filled the Lyrica

## 2013-08-16 ENCOUNTER — Other Ambulatory Visit: Payer: Self-pay | Admitting: Family Medicine

## 2013-09-04 ENCOUNTER — Other Ambulatory Visit: Payer: Self-pay | Admitting: Physician Assistant

## 2013-09-06 NOTE — Telephone Encounter (Signed)
This is metheneys pt and pain medication refill.

## 2013-09-07 ENCOUNTER — Other Ambulatory Visit: Payer: Self-pay | Admitting: Family Medicine

## 2013-09-17 ENCOUNTER — Encounter: Payer: Self-pay | Admitting: Family Medicine

## 2013-09-17 ENCOUNTER — Ambulatory Visit (INDEPENDENT_AMBULATORY_CARE_PROVIDER_SITE_OTHER): Payer: Medicare Other | Admitting: Family Medicine

## 2013-09-17 VITALS — BP 104/65 | HR 77 | Wt 154.0 lb

## 2013-09-17 DIAGNOSIS — J441 Chronic obstructive pulmonary disease with (acute) exacerbation: Secondary | ICD-10-CM

## 2013-09-17 DIAGNOSIS — F17200 Nicotine dependence, unspecified, uncomplicated: Secondary | ICD-10-CM

## 2013-09-17 MED ORDER — LOSARTAN POTASSIUM-HCTZ 100-25 MG PO TABS: ORAL_TABLET | ORAL | Status: DC

## 2013-09-17 MED ORDER — DOXYCYCLINE HYCLATE 100 MG PO TABS: 100.0000 mg | ORAL_TABLET | Freq: Two times a day (BID) | ORAL | Status: DC

## 2013-09-17 MED ORDER — PENTAZOCINE-NALOXONE HCL 50-0.5 MG PO TABS: ORAL_TABLET | ORAL | Status: DC

## 2013-09-17 MED ORDER — ALPRAZOLAM 0.25 MG PO TABS: ORAL_TABLET | ORAL | Status: DC

## 2013-09-17 NOTE — Progress Notes (Signed)
   Subjective:    Patient ID: Annette Ramirez, female    DOB: [birth year], 68 y.o.   MRN: [id number]  HPI Cough x  1 months. Feels like phlegm in her chest but says the cough is dry. No fever chills or sweats. +post nasal drip.  No sinus congestion. Has been wheezin and SOB for about 2 weeks. Occ able to get green sputum out.    Review of Systems     Objective:   Physical Exam  Constitutional: She is oriented to person, place, and time. She appears well-developed and well-nourished.  HENT:  Head: Normocephalic and atraumatic.  Right Ear: External ear normal.  Eyes: Conjunctivae and EOM are normal. Pupils are equal, round, and reactive to light.  Neck: Neck supple. No thyromegaly present.  Cardiovascular: Normal rate, regular rhythm and normal heart sounds.   Pulmonary/Chest: Effort normal and breath sounds normal.  Lymphadenopathy:    She has no cervical adenopathy.  Neurological: She is alert and oriented to person, place, and time.  Skin: Skin is warm and dry.  Psychiatric: She has a normal mood and affect. Her behavior is normal.          Assessment & Plan:  COPD exacerbation - will treat with IM Solu-Medrol. Given albuterol and Atrovent nebulizer treatment here in the office. We'll start her on antibiotics as well.  Tobacco abuse-encourage cessation.she has cut back but still smoking. Using e cog some.

## 2013-09-18 ENCOUNTER — Encounter: Payer: Self-pay | Admitting: *Deleted

## 2013-09-19 ENCOUNTER — Telehealth: Payer: Self-pay | Admitting: *Deleted

## 2013-09-19 NOTE — Telephone Encounter (Signed)
Pt is aware.Annette Ramirez

## 2013-10-01 ENCOUNTER — Ambulatory Visit (INDEPENDENT_AMBULATORY_CARE_PROVIDER_SITE_OTHER): Payer: Medicare Other | Admitting: Family Medicine

## 2013-10-01 ENCOUNTER — Encounter: Payer: Self-pay | Admitting: Family Medicine

## 2013-10-01 VITALS — BP 106/60 | HR 60 | Wt 153.0 lb

## 2013-10-01 DIAGNOSIS — Z23 Encounter for immunization: Secondary | ICD-10-CM

## 2013-10-01 DIAGNOSIS — Z0000 Encounter for general adult medical examination without abnormal findings: Secondary | ICD-10-CM

## 2013-10-01 DIAGNOSIS — I10 Essential (primary) hypertension: Secondary | ICD-10-CM

## 2013-10-01 MED ORDER — AMBULATORY NON FORMULARY MEDICATION: Status: DC

## 2013-10-01 MED ORDER — ATENOLOL 100 MG PO TABS: ORAL_TABLET | ORAL | Status: DC

## 2013-10-01 MED ORDER — QUETIAPINE FUMARATE 25 MG PO TABS: ORAL_TABLET | ORAL | Status: DC

## 2013-10-01 MED ORDER — LOSARTAN POTASSIUM-HCTZ 100-25 MG PO TABS: ORAL_TABLET | ORAL | Status: DC

## 2013-10-01 NOTE — Patient Instructions (Addendum)
complete physical examination Keep up a regular exercise program and make sure you are eating a healthy diet Try to eat 4 servings of dairy a day, or if you are lactose intolerant take a calcium with vitamin D daily.  Your vaccines are up to date.  Smoking Cessation Quitting smoking is important to your health and has many advantages. However, it is not always easy to quit since nicotine is a very addictive drug. Often times, people try 3 times or more before being able to quit. This document explains the best ways for you to prepare to quit smoking. Quitting takes hard work and a lot of effort, but you can do it. ADVANTAGES OF QUITTING SMOKING  You will live longer, feel better, and live better.  Your body will feel the impact of quitting smoking almost immediately.  Within 20 minutes, blood pressure decreases. Your pulse returns to its normal level.  After 8 hours, carbon monoxide levels in the blood return to normal. Your oxygen level increases.  After 24 hours, the chance of having a heart attack starts to decrease. Your breath, hair, and body stop smelling like smoke.  After 48 hours, damaged nerve endings begin to recover. Your sense of taste and smell improve.  After 72 hours, the body is virtually free of nicotine. Your bronchial tubes relax and breathing becomes easier.  After 2 to 12 weeks, lungs can hold more air. Exercise becomes easier and circulation improves.  The risk of having a heart attack, stroke, cancer, or lung disease is greatly reduced.  After 1 year, the risk of coronary heart disease is cut in half.  After 5 years, the risk of stroke falls to the same as a nonsmoker.  After 10 years, the risk of lung cancer is cut in half and the risk of other cancers decreases significantly.  After 15 years, the risk of coronary heart disease drops, usually to the level of a nonsmoker.  If you are pregnant, quitting smoking will improve your chances of having a healthy  baby.  The people you live with, especially any children, will be healthier.  You will have extra money to spend on things other than cigarettes. QUESTIONS TO THINK ABOUT BEFORE ATTEMPTING TO QUIT You may want to talk about your answers with your caregiver.  Why do you want to quit?  If you tried to quit in the past, what helped and what did not?  What will be the most difficult situations for you after you quit? How will you plan to handle them?  Who can help you through the tough times? Your family? Friends? A caregiver?  What pleasures do you get from smoking? What ways can you still get pleasure if you quit? Here are some questions to ask your caregiver:  How can you help me to be successful at quitting?  What medicine do you think would be best for me and how should I take it?  What should I do if I need more help?  What is smoking withdrawal like? How can I get information on withdrawal? GET READY  Set a quit date.  Change your environment by getting rid of all cigarettes, ashtrays, matches, and lighters in your home, car, or work. Do not let people smoke in your home.  Review your past attempts to quit. Think about what worked and what did not. GET SUPPORT AND ENCOURAGEMENT You have a better chance of being successful if you have help. You can get support in many ways.  Tell your family, friends, and co-workers that you are going to quit and need their support. Ask them not to smoke around you.  Get individual, group, or telephone counseling and support. Programs are available at [employer] and health centers. Call your local health department for information about programs in your area.  Spiritual beliefs and practices may help some smokers quit.  Download a "quit meter" on your computer to keep track of quit statistics, such as how long you have gone without smoking, cigarettes not smoked, and money saved.  Get a self-help book about quitting smoking and  staying off of tobacco. [place] yourself from urges to smoke. Talk to someone, go for a walk, or occupy your time with a task.  Change your normal routine. Take a different route to work. Drink tea instead of coffee. Eat breakfast in a different place.  Reduce your stress. Take a hot bath, exercise, or read a book.  Plan something enjoyable to do every day. Reward yourself for not smoking.  Explore interactive web-based programs that specialize in helping you quit. GET MEDICINE AND USE IT CORRECTLY Medicines can help you stop smoking and decrease the urge to smoke. Combining medicine with the above behavioral methods and support can greatly increase your chances of successfully quitting smoking.  Nicotine replacement therapy helps deliver nicotine to your body without the negative effects and risks of smoking. Nicotine replacement therapy includes nicotine gum, lozenges, inhalers, nasal sprays, and skin patches. Some may be available over-the-counter and others require a prescription.  Antidepressant medicine helps people abstain from smoking, but how this works is unknown. This medicine is available by prescription.  Nicotinic receptor partial agonist medicine simulates the effect of nicotine in your brain. This medicine is available by prescription. Ask your caregiver for advice about which medicines to use and how to use them based on your health history. Your caregiver will tell you what side effects to look out for if you choose to be on a medicine or therapy. Carefully read the information on the package. Do not use any other product containing nicotine while using a nicotine replacement product.  RELAPSE OR DIFFICULT SITUATIONS Most relapses occur within the first 3 months after quitting. Do not be discouraged if you start smoking again. Remember, most people try several times before finally quitting. You may have symptoms of withdrawal because your body  is used to nicotine. You may crave cigarettes, be irritable, feel very hungry, cough often, get headaches, or have difficulty concentrating. The withdrawal symptoms are only temporary. They are strongest when you first quit, but they will go away within 10 14 days. To reduce the chances of relapse, try to:  Avoid drinking alcohol. Drinking lowers your chances of successfully quitting.  Reduce the amount of caffeine you consume. Once you quit smoking, the amount of caffeine in your body increases and can give you symptoms, such as a rapid heartbeat, sweating, and anxiety.  Avoid smokers because they can make you want to smoke.  Do not let weight gain distract you. Many smokers will gain weight when they quit, usually less than 10 pounds. Eat a healthy diet and stay active. You can always lose the weight gained after you quit.  Find ways to improve your mood other than smoking. FOR MORE INFORMATION  www.smokefree.gov  Document Released: 04/13/2001 Document Revised: 10/19/2011 Document Reviewed: 07/29/2011 [facility] Patient Information 2014 [place], [place].

## 2013-10-01 NOTE — Progress Notes (Signed)
Subjective:    Annette Ramirez is a 68 y.o. female who presents for Medicare Annual/Subsequent preventive examination.  Preventive Screening-Counseling & Management  Tobacco History  Smoking status  . Current Every Day Smoker -- 0.25 packs/day for 40 years  . Types: Cigarettes  Smokeless tobacco  . Never Used    Comment: 02/28/2013 "started smoking eletronic cigarettes a couple years ago"     Problems Prior to Visit 1.   Current Problems (verified) Patient Active Problem List   Diagnosis Date Noted  . Left lumbar radiculitis 07/25/2012  . COPD 07/02/2010  . DEPRESSION 02/13/2009  . HEARING LOSS 09/25/2008  . GERD 10/10/2007  . INSOMNIA 03/21/2007  . ANXIETY DISORDER, GENERALIZED 03/10/2006  . TOBACCO ABUSE 03/10/2006  . HYPERTENSION, BENIGN ESSENTIAL 03/10/2006  . FIBROMYALGIA 03/10/2006  . OSTEOPOROSIS 03/10/2006    Medications Prior to Visit Current Outpatient Prescriptions on File Prior to Visit  Medication Sig Dispense Refill  . albuterol (PROVENTIL HFA;VENTOLIN HFA) 108 (90 BASE) MCG/ACT inhaler Inhale 2 puffs into the lungs every 6 (six) hours as needed for wheezing.  18 g  5  . ALPRAZolam (XANAX) 0.25 MG tablet TAKE ONE TABLET BY MOUTH 3 TIMES A DAY AS NEEDED  90 tablet  0  . AMBULATORY NON FORMULARY MEDICATION Medication Name: Shingles vaccine IM x 1  1 vial  0  . atenolol (TENORMIN) 100 MG tablet TAKE ONE TABLET BY MOUTH 2 TIMES A DAY  180 tablet  1  . citalopram (CELEXA) 20 MG tablet TAKE 1 TABLET BY MOUTH ONCE DAILY  90 tablet  1  . losartan-hydrochlorothiazide (HYZAAR) 100-25 MG per tablet TAKE ONE TABLET BY MOUTH EVERY DAY  90 tablet  0  . LYRICA 75 MG capsule TAKE ONE CAPSULE BY MOUTH 2 TIMES A DAY  180 capsule  1  . omeprazole (PRILOSEC) 40 MG capsule TAKE ONE CAPSULE BY MOUTH EVERY DAY  30 capsule  3  . pentazocine-naloxone (TALWIN NX) 50-0.5 MG per tablet TAKE ONE TABLET BY MOUTH EVERY 6 HOURS AS NEEDED FOR PAIN  60 tablet  0  . QUEtiapine (SEROQUEL)  25 MG tablet TAKE ONE TABLET BY MOUTH AT BEDTIME  90 tablet  1   No current facility-administered medications on file prior to visit.    Current Medications (verified) Current Outpatient Prescriptions  Medication Sig Dispense Refill  . albuterol (PROVENTIL HFA;VENTOLIN HFA) 108 (90 BASE) MCG/ACT inhaler Inhale 2 puffs into the lungs every 6 (six) hours as needed for wheezing.  18 g  5  . ALPRAZolam (XANAX) 0.25 MG tablet TAKE ONE TABLET BY MOUTH 3 TIMES A DAY AS NEEDED  90 tablet  0  . AMBULATORY NON FORMULARY MEDICATION Medication Name: Shingles vaccine IM x 1  1 vial  0  . atenolol (TENORMIN) 100 MG tablet TAKE ONE TABLET BY MOUTH 2 TIMES A DAY  180 tablet  1  . citalopram (CELEXA) 20 MG tablet TAKE 1 TABLET BY MOUTH ONCE DAILY  90 tablet  1  . losartan-hydrochlorothiazide (HYZAAR) 100-25 MG per tablet TAKE ONE TABLET BY MOUTH EVERY DAY  90 tablet  0  . LYRICA 75 MG capsule TAKE ONE CAPSULE BY MOUTH 2 TIMES A DAY  180 capsule  1  . omeprazole (PRILOSEC) 40 MG capsule TAKE ONE CAPSULE BY MOUTH EVERY DAY  30 capsule  3  . pentazocine-naloxone (TALWIN NX) 50-0.5 MG per tablet TAKE ONE TABLET BY MOUTH EVERY 6 HOURS AS NEEDED FOR PAIN  60 tablet  0  .  QUEtiapine (SEROQUEL) 25 MG tablet TAKE ONE TABLET BY MOUTH AT BEDTIME  90 tablet  1   No current facility-administered medications for this visit.     Allergies (verified) Amoxicillin-pot clavulanate; Aspirin; and Codeine   PAST HISTORY  Family History Family History  Problem Relation Age of Onset  . Alzheimer's disease Mother   . Prostate cancer      grandfather  . Stroke      grandparent    Social History History  Substance Use Topics  . Smoking status: Current Every Day Smoker -- 0.25 packs/day for 40 years    Types: Cigarettes  . Smokeless tobacco: Never Used     Comment: 02/28/2013 "started smoking eletronic cigarettes a couple years ago"  . Alcohol Use: 3.0 oz/week    5 Glasses of wine per week     Are there smokers  in your home (other than you)? No  Risk Factors Current exercise habits: Home exercise routine includes walking, yard work.  Dietary issues discussed: None   Cardiac risk factors: advanced age (older than 20 for men, 68 for women), hypertension, sedentary lifestyle and smoking/ tobacco exposure.  Depression Screen (Note: if answer to either of the following is "Yes", a more complete depression screening is indicated)   Over the past two weeks, have you felt down, depressed or hopeless? No  Over the past two weeks, have you felt little interest or pleasure in doing things? Yes  Have you lost interest or pleasure in daily life? No  Do you often feel hopeless? No  Do you cry easily over simple problems? No  Activities of Daily Living In your present state of health, do you have any difficulty performing the following activities?:  Driving? No Managing money?  No Feeding yourself? No Getting from bed to chair? No Climbing a flight of stairs? No Preparing food and eating?: No Bathing or showering? No Getting dressed: No Getting to the toilet? No Using the toilet:No Moving around from place to place: No In the past year have you fallen or had a near fall?:Yes   Are you sexually active?  No  Do you have more than one partner?  No  Hearing Difficulties: No Do you often ask people to speak up or repeat themselves? No Do you experience ringing or noises in your ears? No Do you have difficulty understanding soft or whispered voices? No   Do you feel that you have a problem with memory? Yes  Do you often misplace items? No  Do you feel safe at home?  Yes  Cognitive Testing  Alert? Yes  Normal Appearance?Yes  Oriented to person? Yes  Place? Yes   Time? Yes  Recall of three objects?  Yes  Can perform simple calculations? Yes  Displays appropriate judgment?Yes  Can read the correct time from a watch face?Yes   Advanced Directives have been discussed with the patient? Yes  List  the Names of Other Physician/Practitioners you currently use: 1.    Indicate any recent Medical Services you may have received from other than [facility] providers in the past year (date may be approximate).  Immunization History  Administered Date(s) Administered  . Influenza Split 03/22/2012  . Influenza Whole 03/10/2006, 02/01/2008  . Influenza,inj,Quad PF,36+ Mos 03/01/2013  . Pneumococcal Polysaccharide-23 09/25/2008  . Td 05/03/2004    Screening Tests Health Maintenance  Topic Date Due  . Zostavax  07/25/2003  . Influenza Vaccine  12/01/2013  . Tetanus/tdap  05/03/2014  . Mammogram  01/30/2015  .  Colonoscopy  05/04/2019  . Pneumococcal Polysaccharide Vaccine Age 76 And Over  Completed    All answers were reviewed with the patient and necessary referrals were made:  Zaydyn Havey, MD   10/01/2013   History reviewed: allergies, current medications, past family history, past medical history, past social history, past surgical history and problem list  Review of Systems A comprehensive review of systems was negative.    Objective:     Vision by Snellen chart: she reporst her eye exam is UTD.   Body mass index is 27.98 kg/(m^2). BP 106/60  Pulse 60  Wt 153 lb (69.4 kg)  BP 106/60  Pulse 60  Wt 153 lb (69.4 kg)  General Appearance:    Alert, cooperative, no distress, appears stated age  Head:    Normocephalic, without obvious abnormality, atraumatic  Eyes:    PERRL, conjunctiva/corneas clear, EOM's intact, both eyes  Ears:    Normal TM's and external ear canals, both ears  Nose:   Nares normal, septum midline, mucosa normal, no drainage    or sinus tenderness  Throat:   Lips, mucosa, and tongue normal; teeth and gums normal  Neck:   Supple, symmetrical, trachea midline, no adenopathy;    thyroid:  no enlargement/tenderness/nodules; no carotid   bruit or JVD  Back:     Symmetric, no curvature, ROM normal, no CVA tenderness  Lungs:     Clear to auscultation  bilaterally, respirations unlabored  Chest Wall:    No tenderness or deformity   Heart:    Regular rate and rhythm, S1 and S2 normal, no murmur, rub   or gallop  Breast Exam:    No tenderness, masses, or nipple abnormality  Abdomen:     Soft, non-tender, bowel sounds active all four quadrants,    no masses, no organomegaly  Genitalia:    Not peformed  Rectal:    Not performed   Extremities:   Extremities normal, atraumatic, no cyanosis or edema  Pulses:   2+ and symmetric all extremities  Skin:   Skin color, texture, turgor normal, no rashes or lesions  Lymph nodes:   Cervical, supraclavicular, and axillary nodes normal  Neurologic:   CNII-XII intact, normal strength, reflexes    Throughout, + tremor       Assessment:     Medicare annual wellness exam      Plan:     During the course of the visit the patient was educated and counseled about appropriate screening and preventive services including:    Pneumococcal vaccine   shingles vaccine  Due for labs for her HTN.   HTN- well controlled.    Diet review for nutrition referral? Yes ____  Not Indicated _X__   Patient Instructions (the written plan) was given to the patient.  Medicare Attestation I have personally reviewed: The patient's medical and social history Their use of alcohol, tobacco or illicit drugs Their current medications and supplements The patient's functional ability including ADLs,fall risks, home safety risks, cognitive, and hearing and visual impairment Diet and physical activities Evidence for depression or mood disorders  The patient's weight, height, BMI, and visual acuity have been recorded in the chart.  I have made referrals, counseling, and provided education to the patient based on review of the above and I have provided the patient with a written personalized care plan for preventive services.     Aleksa Collinsworth, MD   10/01/2013

## 2013-10-23 LAB — COMPLETE METABOLIC PANEL WITH GFR
ALBUMIN: 4.3 g/dL (ref 3.5–5.2)
ALT: 29 U/L (ref 0–35)
AST: 30 U/L (ref 0–37)
Alkaline Phosphatase: 55 U/L (ref 39–117)
BUN: 16 mg/dL (ref 6–23)
CALCIUM: 9.3 mg/dL (ref 8.4–10.5)
CO2: 33 meq/L — AB (ref 19–32)
Chloride: 93 mEq/L — ABNORMAL LOW (ref 96–112)
Creat: 0.73 mg/dL (ref 0.50–1.10)
GFR, EST NON AFRICAN AMERICAN: 84 mL/min
GLUCOSE: 84 mg/dL (ref 70–99)
Potassium: 4.3 mEq/L (ref 3.5–5.3)
SODIUM: 133 meq/L — AB (ref 135–145)
TOTAL PROTEIN: 6.5 g/dL (ref 6.0–8.3)
Total Bilirubin: 1.2 mg/dL (ref 0.2–1.2)

## 2013-10-23 LAB — LIPID PANEL
Cholesterol: 188 mg/dL (ref 0–200)
HDL: 77 mg/dL (ref >39)
LDL Cholesterol: 85 mg/dL (ref 0–99)
Total CHOL/HDL Ratio: 2.4 Ratio
Triglycerides: 130 mg/dL (ref <150)
VLDL: 26 mg/dL (ref 0–40)

## 2013-10-29 ENCOUNTER — Other Ambulatory Visit: Payer: Self-pay | Admitting: *Deleted

## 2013-10-29 DIAGNOSIS — E871 Hypo-osmolality and hyponatremia: Secondary | ICD-10-CM

## 2013-10-30 LAB — BASIC METABOLIC PANEL
BUN: 11 mg/dL (ref 6–23)
CHLORIDE: 96 meq/L (ref 96–112)
CO2: 30 meq/L (ref 19–32)
Calcium: 9.1 mg/dL (ref 8.4–10.5)
Creat: 0.76 mg/dL (ref 0.50–1.10)
GLUCOSE: 85 mg/dL (ref 70–99)
Potassium: 4.4 mEq/L (ref 3.5–5.3)
SODIUM: 137 meq/L (ref 135–145)

## 2013-10-31 ENCOUNTER — Encounter: Payer: Self-pay | Admitting: *Deleted

## 2013-11-28 ENCOUNTER — Other Ambulatory Visit: Payer: Self-pay | Admitting: Family Medicine

## 2013-12-13 ENCOUNTER — Other Ambulatory Visit: Payer: Self-pay | Admitting: Family Medicine

## 2013-12-26 ENCOUNTER — Telehealth: Payer: Self-pay | Admitting: Family Medicine

## 2013-12-26 NOTE — Telephone Encounter (Signed)
Call pt: Now that she is 81, based on her age, we can no longer prescribe her talwin. We will have to find an alternative that is safer.  See if any other pain meds that work well for her.  Can she take hydrocodone for example?

## 2014-01-02 MED ORDER — OXYCODONE-ACETAMINOPHEN 5-325 MG PO TABS: 1.0000 | ORAL_TABLET | Freq: Two times a day (BID) | ORAL | Status: DC | PRN

## 2014-01-02 NOTE — Telephone Encounter (Signed)
Spoke w/pt she stated the hydrocodone does not work as well and the talwin does not affect her stomach she is willing to try something else and she is willing to go back on this she stated that percocet works better.Annette Ramirez [place]

## 2014-01-02 NOTE — Telephone Encounter (Signed)
Okay, I will bring a prescription for Percocet. She can come by and pick it up at her convenience.

## 2014-01-07 ENCOUNTER — Other Ambulatory Visit: Payer: Self-pay | Admitting: Family Medicine

## 2014-01-08 NOTE — Telephone Encounter (Signed)
Pt informed.Annette Ramirez [place]  

## 2014-01-29 ENCOUNTER — Other Ambulatory Visit: Payer: Self-pay | Admitting: Family Medicine

## 2014-01-29 ENCOUNTER — Ambulatory Visit (INDEPENDENT_AMBULATORY_CARE_PROVIDER_SITE_OTHER): Payer: Medicare Other | Admitting: Family Medicine

## 2014-01-29 ENCOUNTER — Encounter: Payer: Self-pay | Admitting: Family Medicine

## 2014-01-29 VITALS — BP 144/81 | HR 63 | Temp 98.1°F | Wt 150.0 lb

## 2014-01-29 DIAGNOSIS — R252 Cramp and spasm: Secondary | ICD-10-CM

## 2014-01-29 DIAGNOSIS — J449 Chronic obstructive pulmonary disease, unspecified: Secondary | ICD-10-CM

## 2014-01-29 LAB — COMPREHENSIVE METABOLIC PANEL
ALBUMIN: 4.7 g/dL (ref 3.5–5.2)
ALT: 44 U/L — AB (ref 0–35)
AST: 45 U/L — AB (ref 0–37)
Alkaline Phosphatase: 53 U/L (ref 39–117)
BUN: 10 mg/dL (ref 6–23)
CALCIUM: 9.4 mg/dL (ref 8.4–10.5)
CO2: 29 mEq/L (ref 19–32)
Chloride: 91 mEq/L — ABNORMAL LOW (ref 96–112)
Creat: 0.79 mg/dL (ref 0.50–1.10)
Glucose, Bld: 89 mg/dL (ref 70–99)
POTASSIUM: 4.2 meq/L (ref 3.5–5.3)
Sodium: 132 mEq/L — ABNORMAL LOW (ref 135–145)
Total Bilirubin: 1 mg/dL (ref 0.2–1.2)
Total Protein: 6.9 g/dL (ref 6.0–8.3)

## 2014-01-29 MED ORDER — DOXYCYCLINE HYCLATE 100 MG PO TABS: ORAL_TABLET | ORAL | Status: AC

## 2014-01-29 MED ORDER — METHYLPREDNISOLONE ACETATE 80 MG/ML IJ SUSP
80.0000 mg | Freq: Once | INTRAMUSCULAR | Status: AC
  Administered 2014-01-29: 80 mg via INTRAMUSCULAR

## 2014-01-30 NOTE — Progress Notes (Signed)
CC: Annette Ramirez is a 68 y.o. female is here for cough x 3 days   Subjective: HPI:  Patient complains of productive cough with wheezing for the past 3 days worsening on a daily basis now moderate in severity present all hours of the day and significantly interfering with sleep. Worse with exertion, slightly benefits from albuterol however only temporarily. Mild shortness of breath with exertion. Reports subjective fevers and sweats without chills. Denies chest pain, motor or sensory disturbances other than that described below. Denies abdominal pain, nausea, confusion, nor facial pressure or nasal congestion.  She tells me that she gets occasional cramping of the hands localized at the base of the fingers causing flexion of the fingers and hand. Symptoms occur at rest never with exertion. She produces this happen maybe once a week for the past few months. She started noticing this when her sodium level was found to be slightly lowered earlier this summer. She denies any motor or sensory disturbances elsewhere   Review Of Systems Outlined In HPI  Past Medical History  Diagnosis Date  . COPD (chronic obstructive pulmonary disease)   . GERD (gastroesophageal reflux disease)   . Hypertension   . Fibromyalgia   . Depression   . Osteoporosis   . PONV (postoperative nausea and vomiting)   . Family history of anesthesia complication     "granddaughter has PONV" (02/28/2013)  . Exertional shortness of breath   . H/O hiatal hernia   . Sinus headache   . Migraines     "in the past; they've stopped" (02/28/2013)  . Anxiety     Past Surgical History  Procedure Laterality Date  . Tonsillectomy  1950  . Tubal ligation  1972  . Colonoscopy w/ biopsies and polypectomy  2012  . Lumbar laminectomy  02/28/2013  . Appendectomy  1975  . Abdominal hysterectomy  1975  . Dilation and curettage of uterus  1960's  . Excisional hemorrhoidectomy  1980's  . Breast cyst aspiration Left 1980's  .  Lumbar laminectomy/decompression microdiscectomy N/A 02/28/2013    Procedure: LUMBAR LAMINECTOMY/DECOMPRESSION MICRODISCECTOMY/Lumbar 4-5 decompression;  Surgeon: Sinclair Ship, MD;  Location: [facility];  Service: Orthopedics;  Laterality: N/A;  Lumbar 4-5 decompression   Family History  Problem Relation Age of Onset  . Alzheimer's disease Mother   . Prostate cancer      grandfather  . Stroke      grandparent    History   Social History  . Marital Status: Married    Spouse Name: N/A    Number of Children: N/A  . Years of Education: N/A   Occupational History  . Not on file.   Social History Main Topics  . Smoking status: Current Every Day Smoker -- 0.25 packs/day for 40 years    Types: Cigarettes  . Smokeless tobacco: Never Used     Comment: 02/28/2013 "started smoking eletronic cigarettes a couple years ago"  . Alcohol Use: 3.0 oz/week    5 Glasses of wine per week  . Drug Use: No  . Sexual Activity: No   Other Topics Concern  . Not on file   Social History Narrative  . No narrative on file     Objective: BP 144/81  Pulse 63  Temp(Src) 98.1 F (36.7 C) (Oral)  Wt 150 lb (68.04 kg)  SpO2 94%  General: Alert and Oriented, No Acute Distress HEENT: Pupils equal, round, reactive to light. Conjunctivae clear.  External ears unremarkable, canals clear with intact TMs with  appropriate landmarks.  Middle ear appears open without effusion. Pink inferior turbinates.  Moist mucous membranes, pharynx without inflammation nor lesions.  Neck supple without palpable lymphadenopathy nor abnormal masses. Lungs: Comfortable work of breathing with mild central rhonchi but no wheezing or rales Cardiac: Regular rate and rhythm. Normal S1/S2.  No murmurs, rubs, nor gallops.   Extremities: No peripheral edema.  Strong peripheral pulses.  Mental Status: No depression, anxiety, nor agitation. Skin: Warm and dry.  Assessment & Plan: Annette Ramirez was seen today for cough x 3  days.  Diagnoses and associated orders for this visit:  COPD - doxycycline (VIBRA-TABS) 100 MG tablet; One by mouth twice a day for ten days. - methylPREDNISolone acetate (DEPO-MEDROL) injection 80 mg; Inject 1 mL (80 mg total) into the muscle once.  Muscle cramp - Comp Met (CMET)    COPD exacerbation: Start doxycycline and provided with Depo-Medrol injection today.Signs and symptoms requring emergent/urgent reevaluation were discussed with the patient. Muscle cramping: Recheck potassium, sodium, calcium to rule out electrolyte abnormality  Return if symptoms worsen or fail to improve.

## 2014-02-13 ENCOUNTER — Other Ambulatory Visit: Payer: Self-pay | Admitting: Family Medicine

## 2014-02-14 ENCOUNTER — Ambulatory Visit (INDEPENDENT_AMBULATORY_CARE_PROVIDER_SITE_OTHER): Payer: Medicare Other | Admitting: Family Medicine

## 2014-02-14 ENCOUNTER — Encounter: Payer: Self-pay | Admitting: Family Medicine

## 2014-02-14 ENCOUNTER — Other Ambulatory Visit: Payer: Self-pay | Admitting: *Deleted

## 2014-02-14 VITALS — BP 128/84 | HR 66 | Temp 98.1°F | Wt 148.0 lb

## 2014-02-14 DIAGNOSIS — R252 Cramp and spasm: Secondary | ICD-10-CM

## 2014-02-14 DIAGNOSIS — J449 Chronic obstructive pulmonary disease, unspecified: Secondary | ICD-10-CM

## 2014-02-14 DIAGNOSIS — Z23 Encounter for immunization: Secondary | ICD-10-CM

## 2014-02-14 DIAGNOSIS — R748 Abnormal levels of other serum enzymes: Secondary | ICD-10-CM

## 2014-02-14 DIAGNOSIS — E871 Hypo-osmolality and hyponatremia: Secondary | ICD-10-CM

## 2014-02-14 LAB — POCT URINALYSIS DIPSTICK
Bilirubin, UA: NEGATIVE
Blood, UA: NEGATIVE
Glucose, UA: NEGATIVE
Ketones, UA: NEGATIVE
Nitrite, UA: NEGATIVE
PROTEIN UA: NEGATIVE
Spec Grav, UA: 1.02
UROBILINOGEN UA: 0.2
pH, UA: 6

## 2014-02-14 MED ORDER — OMEPRAZOLE 40 MG PO CPDR: 40.0000 mg | DELAYED_RELEASE_CAPSULE | Freq: Every day | ORAL | Status: DC

## 2014-02-14 MED ORDER — LOSARTAN POTASSIUM-HCTZ 100-12.5 MG PO TABS: 1.0000 | ORAL_TABLET | Freq: Every day | ORAL | Status: DC

## 2014-02-14 MED ORDER — LOSARTAN POTASSIUM 100 MG PO TABS: 100.0000 mg | ORAL_TABLET | Freq: Every day | ORAL | Status: DC

## 2014-02-14 NOTE — Addendum Note (Signed)
Addended by: Teddy Spike on: 02/14/2014 11:57 AM   Modules accepted: Orders

## 2014-02-14 NOTE — Progress Notes (Signed)
   Subjective:    Patient ID: Annette Ramirez, female    DOB: 1946-03-14, 68 y.o.   MRN: [id number]  HPI Followup hyponatremia- last sodium was 132. It could be her diuretic or her citalopram.  Has been having waves of nause intermittantly.  Say says over the summer she noticed her sweat wasn't salty.  She has be incontinent at night a couple of times.   \ Her liver enzymes were elevated on recent lab work as well. She's not taking any Tylenol but does drink alcohol.  Also getting muscle cramping in her legs from her thigh down to the arch of her foot. It can happen in the last part of the night. She has not changed her exercise routine.    Review of Systems     Objective:   Physical Exam  Constitutional: She is oriented to person, place, and time. She appears well-developed and well-nourished.  HENT:  Head: Normocephalic and atraumatic.  Cardiovascular: Normal rate, regular rhythm and normal heart sounds.   Pulmonary/Chest: Effort normal and breath sounds normal.  Abdominal: Soft. Bowel sounds are normal. She exhibits no distension and no mass. There is no tenderness. There is no rebound and no guarding.  Neurological: She is alert and oriented to person, place, and time.  Skin: Skin is warm and dry.  Psychiatric: She has a normal mood and affect. Her behavior is normal.          Assessment & Plan:  Hyponatremia - we will decrease her diuretic to 12.5 (50% reduction). Repeat sodium in 2 weeks.  I offered to completely stop her diuretic but she wanted to keep a little bit of it to help with her blood pressure. We will see her back in one month to make sure that her blood pressure looks well controlled after the adjustment. It looks great today.  Elevated liver enzymes-discussed cutting back on alcohol use and avoid Tylenol products for now and we will recheck her liver in 2 weeks.  Muscle cramping-could be related to the hyponatremia but we will also check magnesium level.

## 2014-02-15 LAB — COMPLETE METABOLIC PANEL WITH GFR
ALK PHOS: 54 U/L (ref 39–117)
ALT: 36 U/L — ABNORMAL HIGH (ref 0–35)
AST: 49 U/L — ABNORMAL HIGH (ref 0–37)
Albumin: 4.8 g/dL (ref 3.5–5.2)
BILIRUBIN TOTAL: 0.6 mg/dL (ref 0.2–1.2)
BUN: 16 mg/dL (ref 6–23)
CO2: 28 mEq/L (ref 19–32)
Calcium: 9.4 mg/dL (ref 8.4–10.5)
Chloride: 93 mEq/L — ABNORMAL LOW (ref 96–112)
Creat: 0.88 mg/dL (ref 0.50–1.10)
GFR, EST NON AFRICAN AMERICAN: 67 mL/min
GFR, Est African American: 77 mL/min
GLUCOSE: 101 mg/dL — AB (ref 70–99)
Potassium: 4.2 mEq/L (ref 3.5–5.3)
SODIUM: 133 meq/L — AB (ref 135–145)
Total Protein: 7 g/dL (ref 6.0–8.3)

## 2014-02-15 LAB — MAGNESIUM: MAGNESIUM: 1.6 mg/dL (ref 1.5–2.5)

## 2014-02-15 LAB — TSH: TSH: 1.606 u[IU]/mL (ref 0.350–4.500)

## 2014-04-09 ENCOUNTER — Other Ambulatory Visit: Payer: Self-pay | Admitting: Family Medicine

## 2014-04-09 ENCOUNTER — Encounter: Payer: Self-pay | Admitting: Family Medicine

## 2014-04-09 ENCOUNTER — Ambulatory Visit (INDEPENDENT_AMBULATORY_CARE_PROVIDER_SITE_OTHER): Payer: Medicare Other | Admitting: Family Medicine

## 2014-04-09 VITALS — BP 139/82 | HR 66 | Wt 151.0 lb

## 2014-04-09 DIAGNOSIS — R945 Abnormal results of liver function studies: Secondary | ICD-10-CM

## 2014-04-09 DIAGNOSIS — R531 Weakness: Secondary | ICD-10-CM

## 2014-04-09 DIAGNOSIS — E871 Hypo-osmolality and hyponatremia: Secondary | ICD-10-CM

## 2014-04-09 DIAGNOSIS — M5010 Cervical disc disorder with radiculopathy, unspecified cervical region: Secondary | ICD-10-CM

## 2014-04-09 DIAGNOSIS — R7989 Other specified abnormal findings of blood chemistry: Secondary | ICD-10-CM

## 2014-04-09 NOTE — Progress Notes (Signed)
CC: Annette Ramirez is a 68 y.o. female is here for arm numbness   Subjective: HPI:  Patient reports that over the weekend on early Sunday morning she woke up with a coughing fit and had forceful coughing more severe than she is used to. She had an acute onset of severe cold sensation from the axilla distally in both arms, a headache in the forehead that radiated into the back of the neck and posterior neck discomfort.  She denies any other motor or sensory disturbances that occurred during or after this episode other than fatigue described below.. Treatment included 3 baby aspirins. She reports that after 5 minutes of sitting still with the comfort of her husband symptoms completely resolved and have not returned. She is worried that she may have had a stroke.    She's had fatigue for matter of months that is described as moderate to severe in severity localized in the entire body but upon direct questioning she does further localizes it to the shoulders and the muscles around the hips. Symptoms are present on a daily basis. They are worse the more she thinks about it. Nothing else seems to make better or worse.  She denies any accompanying component of shortness of breath or chest pain. She has always attributed to her fibromyalgia but she is beginning to wonder if it something else.  She has a history of elevated LFTs And hyponatremia and was advised to follow-up a little over a month ago to have these rechecked. Interventions have included her PCP decreasing her hydrochlorothiazide daily regimen. She has also try to reduce her acetaminophen intake.Denies right upper quadrant pain or jaundice Review Of Systems Outlined In HPI  Past Medical History  Diagnosis Date  . COPD (chronic obstructive pulmonary disease)   . GERD (gastroesophageal reflux disease)   . Hypertension   . Fibromyalgia   . Depression   . Osteoporosis   . PONV (postoperative nausea and vomiting)   . Family history of  anesthesia complication     "granddaughter has PONV" (02/28/2013)  . Exertional shortness of breath   . H/O hiatal hernia   . Sinus headache   . Migraines     "in the past; they've stopped" (02/28/2013)  . Anxiety     Past Surgical History  Procedure Laterality Date  . Tonsillectomy  1950  . Tubal ligation  1972  . Colonoscopy w/ biopsies and polypectomy  2012  . Lumbar laminectomy  02/28/2013  . Appendectomy  1975  . Abdominal hysterectomy  1975  . Dilation and curettage of uterus  1960's  . Excisional hemorrhoidectomy  1980's  . Breast cyst aspiration Left 1980's  . Lumbar laminectomy/decompression microdiscectomy N/A 02/28/2013    Procedure: LUMBAR LAMINECTOMY/DECOMPRESSION MICRODISCECTOMY/Lumbar 4-5 decompression;  Surgeon: Sinclair Ship, MD;  Location: [place];  Service: Orthopedics;  Laterality: N/A;  Lumbar 4-5 decompression   Family History  Problem Relation Age of Onset  . Alzheimer's disease Mother   . Prostate cancer      grandfather  . Stroke      grandparent    History   Social History  . Marital Status: Married    Spouse Name: N/A    Number of Children: N/A  . Years of Education: N/A   Occupational History  . Not on file.   Social History Main Topics  . Smoking status: Current Some Day Smoker -- 0.25 packs/day for 40 years    Types: Cigarettes  . Smokeless tobacco: Never Used  Comment: 02/28/2013 "started smoking eletronic cigarettes a couple years ago"  . Alcohol Use: 3.0 oz/week    5 Glasses of wine per week  . Drug Use: No  . Sexual Activity: No   Other Topics Concern  . Not on file   Social History Narrative     Objective: BP 139/82 mmHg  Pulse 66  Wt 151 lb (68.493 kg)  SpO2 94%  General: Alert and Oriented, No Acute Distress HEENT: Pupils equal, round, reactive to light. Conjunctivae clear.  External ears unremarkable, canals clear with intact TMs with appropriate landmarks.  Middle ear appears open without effusion. Pink  inferior turbinates.  Moist mucous membranes, pharynx without inflammation nor lesions.  Neck supple without palpable lymphadenopathy nor abnormal masses. Lungs: Clear to auscultation bilaterally, no wheezing/ronchi/rales.  Comfortable work of breathing. Good air movement. Neuro: CN II-XII grossly intact, full strength/rom of all four extremities, C5/L4/S1 DTRs 2/4 bilaterally, gait normal, rapid alternating movements normal, Extremities: No peripheral edema.  Strong peripheral pulses.  Mental Status: No depression, anxiety, nor agitation. Skin: Warm and dry.  Assessment & Plan: Dajane was seen today for arm numbness.  Diagnoses and associated orders for this visit:  Hyponatremia - COMPLETE METABOLIC PANEL WITH GFR  Elevated LFTs - COMPLETE METABOLIC PANEL WITH GFR  Weakness - Sed Rate (ESR) - C-reactive protein  Cervical disc disorder with radiculopathy of cervical region    Hyponatremia: Uncertain about clinical interpretation of this given that she is having weakness therefore checking sodium level Elevated LFTs: Overdue for repeat CMP Weakness: Rule out polymyalgia rheumatica Reassurance was provided that I do not feel like she had a stroke or a TIA and that her symptoms in the middle the night were most likely due to a radiculopathy that occurred after transient bulge of the disc which probably impinged on some of the nerve roots in the cervical region. Since she has been painless ever since then we will not obtain x-rays today unless symptoms return.   Return if symptoms worsen or fail to improve.

## 2014-04-10 LAB — COMPLETE METABOLIC PANEL WITH GFR
ALK PHOS: 62 U/L (ref 39–117)
ALT: 19 U/L (ref 0–35)
AST: 23 U/L (ref 0–37)
Albumin: 4.7 g/dL (ref 3.5–5.2)
BILIRUBIN TOTAL: 1 mg/dL (ref 0.2–1.2)
BUN: 17 mg/dL (ref 6–23)
CO2: 31 mEq/L (ref 19–32)
Calcium: 9.7 mg/dL (ref 8.4–10.5)
Chloride: 94 mEq/L — ABNORMAL LOW (ref 96–112)
Creat: 0.68 mg/dL (ref 0.50–1.10)
GFR, EST NON AFRICAN AMERICAN: 89 mL/min
GFR, Est African American: >89 mL/min
GLUCOSE: 102 mg/dL — AB (ref 70–99)
Potassium: 4.4 mEq/L (ref 3.5–5.3)
Sodium: 134 mEq/L — ABNORMAL LOW (ref 135–145)
Total Protein: 6.8 g/dL (ref 6.0–8.3)

## 2014-04-10 LAB — SEDIMENTATION RATE: Sed Rate: 1 mm/hr (ref 0–22)

## 2014-04-10 LAB — C-REACTIVE PROTEIN: CRP: <0.5 mg/dL (ref <0.60)

## 2014-04-12 ENCOUNTER — Other Ambulatory Visit: Payer: Self-pay | Admitting: *Deleted

## 2014-04-12 MED ORDER — OXYCODONE-ACETAMINOPHEN 5-325 MG PO TABS: 1.0000 | ORAL_TABLET | Freq: Two times a day (BID) | ORAL | Status: DC | PRN

## 2014-05-10 ENCOUNTER — Other Ambulatory Visit: Payer: Self-pay | Admitting: Family Medicine

## 2014-05-14 ENCOUNTER — Other Ambulatory Visit: Payer: Self-pay | Admitting: Family Medicine

## 2014-05-14 ENCOUNTER — Telehealth: Payer: Self-pay | Admitting: Family Medicine

## 2014-05-14 MED ORDER — PREGABALIN 75 MG PO CAPS: ORAL_CAPSULE | ORAL | Status: DC

## 2014-05-14 NOTE — Telephone Encounter (Signed)
Prescription sent to [facility]. Printed, signed, faxed.

## 2014-05-14 NOTE — Telephone Encounter (Signed)
Annette Ramirez,  A refill request for Lyrica was accidentally sent to me, I destroyed this Rx and forwarding to you for consideration of refill.

## 2014-05-29 ENCOUNTER — Other Ambulatory Visit: Payer: Self-pay | Admitting: Family Medicine

## 2014-06-05 ENCOUNTER — Other Ambulatory Visit: Payer: Self-pay | Admitting: Family Medicine

## 2014-06-06 MED ORDER — ALBUTEROL SULFATE HFA 108 (90 BASE) MCG/ACT IN AERS: 2.0000 | INHALATION_SPRAY | Freq: Four times a day (QID) | RESPIRATORY_TRACT | Status: DC | PRN

## 2014-06-06 NOTE — Telephone Encounter (Signed)
Patient notified that she needs an appt. with Dr.Metheney for follow up on her hypertension; we have refilled her Losartan for 30 days to allow for this needed appt.

## 2014-06-13 ENCOUNTER — Telehealth: Payer: Self-pay | Admitting: Emergency Medicine

## 2014-06-13 ENCOUNTER — Other Ambulatory Visit: Payer: Self-pay | Admitting: Emergency Medicine

## 2014-06-13 MED ORDER — OXYCODONE-ACETAMINOPHEN 5-325 MG PO TABS: 1.0000 | ORAL_TABLET | Freq: Two times a day (BID) | ORAL | Status: DC | PRN

## 2014-06-13 NOTE — Telephone Encounter (Signed)
Patient message requesting refill of oxydodon 5-325mg  1 by mouth 2 times/day. Has appt. On 07-09-14.

## 2014-07-09 ENCOUNTER — Encounter: Payer: Self-pay | Admitting: Family Medicine

## 2014-07-09 ENCOUNTER — Ambulatory Visit (INDEPENDENT_AMBULATORY_CARE_PROVIDER_SITE_OTHER): Payer: Medicare Other | Admitting: Family Medicine

## 2014-07-09 VITALS — BP 103/62 | HR 57 | Wt 154.0 lb

## 2014-07-09 DIAGNOSIS — M1990 Unspecified osteoarthritis, unspecified site: Secondary | ICD-10-CM | POA: Diagnosis not present

## 2014-07-09 DIAGNOSIS — N3946 Mixed incontinence: Secondary | ICD-10-CM | POA: Diagnosis not present

## 2014-07-09 DIAGNOSIS — M5416 Radiculopathy, lumbar region: Secondary | ICD-10-CM

## 2014-07-09 DIAGNOSIS — I10 Essential (primary) hypertension: Secondary | ICD-10-CM

## 2014-07-09 NOTE — Progress Notes (Signed)
   Subjective:    Patient ID: Annette Ramirez, female    DOB: 21-Sep-1945, 69 y.o.   MRN: [id number]  HPI Hypertension- Pt denies chest pain, SOB, dizziness, or heart palpitations.  Taking meds as directed w/o problems.  Denies medication side effects.    Still having some urinary incontinence. She is getting up 3 times at night.  Says incontinence is getting worse adn not just stress related.  Lumbar radiculopathy and osteoarthritis-she's asking for a refill on her pain medications today but she would like it to be written for double strength to that she can cut them in half and make it last 2 months.   Review of Systems     Objective:   Physical Exam  Constitutional: She is oriented to person, place, and time. She appears well-developed and well-nourished.  HENT:  Head: Normocephalic and atraumatic.  Cardiovascular: Normal rate, regular rhythm and normal heart sounds.   Pulmonary/Chest: Effort normal and breath sounds normal.  Neurological: She is alert and oriented to person, place, and time.  Skin: Skin is warm and dry.  Psychiatric: She has a normal mood and affect. Her behavior is normal.          Assessment & Plan:  HTN - Well controlled.  Continue current regimen. Follow-up in 6 months.  Incontinence mixed - will refer to Urology.    Low Back Pain -dicussed that we won't double the dose.  May be more cost effective but her script really needs to reflect how she is using the medication. She is not sure refill today. I did encourage her to maximize her Tylenol since she cannot take NSAIDs. Recommend 2 extra strength Tylenol 3 times a day. Show discussed with her that study showed a 30% reduction in pain with that regimen.

## 2014-07-10 LAB — BASIC METABOLIC PANEL WITH GFR
BUN: 15 mg/dL (ref 6–23)
CO2: 30 mEq/L (ref 19–32)
Calcium: 9.2 mg/dL (ref 8.4–10.5)
Chloride: 94 mEq/L — ABNORMAL LOW (ref 96–112)
Creat: 0.7 mg/dL (ref 0.50–1.10)
GFR, Est African American: >89 mL/min
GFR, Est Non African American: 88 mL/min
Glucose, Bld: 89 mg/dL (ref 70–99)
Potassium: 4.6 mEq/L (ref 3.5–5.3)
SODIUM: 133 meq/L — AB (ref 135–145)

## 2014-07-12 ENCOUNTER — Other Ambulatory Visit: Payer: Self-pay | Admitting: Family Medicine

## 2014-07-17 ENCOUNTER — Ambulatory Visit (INDEPENDENT_AMBULATORY_CARE_PROVIDER_SITE_OTHER): Payer: Medicare Other | Admitting: Physician Assistant

## 2014-07-17 VITALS — BP 104/62 | HR 61 | Wt 153.0 lb

## 2014-07-17 DIAGNOSIS — Z0130 Encounter for examination of blood pressure without abnormal findings: Secondary | ICD-10-CM

## 2014-07-17 DIAGNOSIS — I10 Essential (primary) hypertension: Secondary | ICD-10-CM | POA: Diagnosis not present

## 2014-07-17 DIAGNOSIS — Z136 Encounter for screening for cardiovascular disorders: Secondary | ICD-10-CM | POA: Diagnosis not present

## 2014-07-17 NOTE — Progress Notes (Signed)
   Subjective:    Patient ID: Annette Ramirez, female    DOB: 07-27-1945, 69 y.o.   MRN: [id number]  HPI Patient is here for a blood pressure check. Denies chest pain, shortness of breath, headaches or medications problems.   Review of Systems     Objective:   Physical Exam        Assessment & Plan:  Hypertension- well controlled.

## 2014-07-25 ENCOUNTER — Other Ambulatory Visit: Payer: Self-pay | Admitting: *Deleted

## 2014-07-25 MED ORDER — OXYCODONE-ACETAMINOPHEN 5-325 MG PO TABS: 1.0000 | ORAL_TABLET | Freq: Two times a day (BID) | ORAL | Status: DC | PRN

## 2014-07-29 ENCOUNTER — Other Ambulatory Visit: Payer: Self-pay | Admitting: Family Medicine

## 2014-07-31 ENCOUNTER — Ambulatory Visit (INDEPENDENT_AMBULATORY_CARE_PROVIDER_SITE_OTHER): Payer: Medicare Other | Admitting: Physician Assistant

## 2014-07-31 ENCOUNTER — Other Ambulatory Visit: Payer: Self-pay | Admitting: Family Medicine

## 2014-07-31 ENCOUNTER — Encounter: Payer: Self-pay | Admitting: Physician Assistant

## 2014-07-31 VITALS — BP 126/69 | HR 63 | Wt 156.0 lb

## 2014-07-31 DIAGNOSIS — J209 Acute bronchitis, unspecified: Secondary | ICD-10-CM

## 2014-07-31 DIAGNOSIS — I10 Essential (primary) hypertension: Secondary | ICD-10-CM

## 2014-07-31 DIAGNOSIS — F329 Major depressive disorder, single episode, unspecified: Secondary | ICD-10-CM | POA: Diagnosis not present

## 2014-07-31 DIAGNOSIS — F32A Depression, unspecified: Secondary | ICD-10-CM

## 2014-07-31 DIAGNOSIS — J441 Chronic obstructive pulmonary disease with (acute) exacerbation: Secondary | ICD-10-CM

## 2014-07-31 DIAGNOSIS — G4700 Insomnia, unspecified: Secondary | ICD-10-CM

## 2014-07-31 DIAGNOSIS — F411 Generalized anxiety disorder: Secondary | ICD-10-CM | POA: Diagnosis not present

## 2014-07-31 MED ORDER — ATENOLOL 100 MG PO TABS: ORAL_TABLET | ORAL | Status: DC

## 2014-07-31 MED ORDER — CITALOPRAM HYDROBROMIDE 20 MG PO TABS: 20.0000 mg | ORAL_TABLET | Freq: Every day | ORAL | Status: DC

## 2014-07-31 MED ORDER — PREDNISONE 50 MG PO TABS: ORAL_TABLET | ORAL | Status: DC

## 2014-07-31 MED ORDER — QUETIAPINE FUMARATE 25 MG PO TABS: ORAL_TABLET | ORAL | Status: DC

## 2014-07-31 MED ORDER — ALPRAZOLAM 0.25 MG PO TABS: ORAL_TABLET | ORAL | Status: DC

## 2014-07-31 MED ORDER — PREGABALIN 75 MG PO CAPS: ORAL_CAPSULE | ORAL | Status: DC

## 2014-07-31 MED ORDER — DOXYCYCLINE HYCLATE 100 MG PO TABS: 100.0000 mg | ORAL_TABLET | Freq: Two times a day (BID) | ORAL | Status: DC

## 2014-07-31 MED ORDER — IPRATROPIUM-ALBUTEROL 0.5-2.5 (3) MG/3ML IN SOLN: 3.0000 mL | RESPIRATORY_TRACT | Status: DC

## 2014-07-31 NOTE — Progress Notes (Signed)
   Subjective:    Patient ID: Annette Ramirez, female    DOB: 1945-10-25, 69 y.o.   MRN: [id number]  HPI  Pt is a 69 yo female with COPD and current smoker who presents with SOB, productive cough, fatigue and wheezing for 6 days. She is not on any daily medications for COPD. She has been using her albuterol inhaler more frequently over past few days. No fever or chills. Not smoking since she started feeling so bad. She is taking mucinex, theraflu, dayquil with little relief.   She does need med refills today.   Insomnia- controlled with seroquel. No problems or concerns.   Anxiety/depression- pt feels controlled. She still has daily anxiety. No suicidal or homicidal thoughts. Taking 1/2 xanax at least daily and sometimes more.   fibromyaglia- doing well on lyrica. Some good and bad days.    HTN- controlled today. No CP, palpiations, headaches. Taking medications daily.    Review of Systems  All other systems reviewed and are negative.      Objective:   Physical Exam  Constitutional: She is oriented to person, place, and time. She appears well-developed and well-nourished.  HENT:  Head: Normocephalic and atraumatic.  Right Ear: External ear normal.  Left Ear: External ear normal.  Nose: Nose normal.  Mouth/Throat: Oropharynx is clear and moist. No oropharyngeal exudate.  Eyes: Conjunctivae are normal.  Neck: Normal range of motion. Neck supple.  Cardiovascular: Normal rate, regular rhythm and normal heart sounds.   Pulmonary/Chest:  Decreased effort and coughing with deep breathing. Bilateral lung wheezing and rhonchi.   Lymphadenopathy:    She has no cervical adenopathy.  Neurological: She is alert and oriented to person, place, and time.  Psychiatric: She has a normal mood and affect. Her behavior is normal.          Assessment & Plan:  COPD exacerbation- pulse ox 93 percent. duoneb given in office today. Pt reported improvement after nebulizer. Prednisone taper with  doxycycline given today. Increase albuterol nebulizer to every 2-4 hours for next couple of days. If not improving or worsening follow up. Encouraged to stop smoking.     Anxiety/depression- PhQ-9 was 9. GAD-7 was 11. Not necessarily controlled but pt declines any medication changes. Follow up with PCP with worsening symptoms. Refilled xanax and celexa.   Fibromyalgia- refilled lyrica for 6 months.   Insomnia- refilled seroquel for 6 months.   HTN- refilled hyzaar and atenolol for 6 months.

## 2014-08-01 ENCOUNTER — Other Ambulatory Visit: Payer: Self-pay | Admitting: Physician Assistant

## 2014-08-08 DIAGNOSIS — Z1231 Encounter for screening mammogram for malignant neoplasm of breast: Secondary | ICD-10-CM | POA: Diagnosis not present

## 2014-08-08 LAB — HM MAMMOGRAPHY

## 2014-08-12 DIAGNOSIS — N3941 Urge incontinence: Secondary | ICD-10-CM | POA: Diagnosis not present

## 2014-08-13 ENCOUNTER — Encounter: Payer: Self-pay | Admitting: Family Medicine

## 2014-08-15 ENCOUNTER — Other Ambulatory Visit: Payer: Self-pay | Admitting: Family Medicine

## 2014-08-22 ENCOUNTER — Other Ambulatory Visit: Payer: Self-pay | Admitting: Family Medicine

## 2014-09-17 ENCOUNTER — Other Ambulatory Visit: Payer: Self-pay

## 2014-09-18 ENCOUNTER — Other Ambulatory Visit: Payer: Self-pay

## 2014-09-18 MED ORDER — OXYCODONE-ACETAMINOPHEN 5-325 MG PO TABS: 1.0000 | ORAL_TABLET | Freq: Two times a day (BID) | ORAL | Status: DC | PRN

## 2014-09-24 DIAGNOSIS — L820 Inflamed seborrheic keratosis: Secondary | ICD-10-CM | POA: Diagnosis not present

## 2014-09-24 DIAGNOSIS — L400 Psoriasis vulgaris: Secondary | ICD-10-CM | POA: Diagnosis not present

## 2014-10-03 DIAGNOSIS — N952 Postmenopausal atrophic vaginitis: Secondary | ICD-10-CM | POA: Diagnosis not present

## 2014-10-03 DIAGNOSIS — N6012 Diffuse cystic mastopathy of left breast: Secondary | ICD-10-CM | POA: Diagnosis not present

## 2014-10-03 DIAGNOSIS — N6011 Diffuse cystic mastopathy of right breast: Secondary | ICD-10-CM | POA: Diagnosis not present

## 2014-10-30 ENCOUNTER — Telehealth: Payer: Self-pay | Admitting: *Deleted

## 2014-10-30 MED ORDER — OXYCODONE-ACETAMINOPHEN 5-325 MG PO TABS: 1.0000 | ORAL_TABLET | Freq: Two times a day (BID) | ORAL | Status: DC | PRN

## 2014-10-30 NOTE — Telephone Encounter (Signed)
Pt notified to pick up rx.

## 2014-10-30 NOTE — Telephone Encounter (Signed)
Amber, Yes since Dr. Jerilynn Mages will be gone until next week.

## 2014-10-30 NOTE — Telephone Encounter (Signed)
Pt left vm asking for a refill on her oxycodone.  Are you ok with signing this? Please advise.

## 2014-11-11 DIAGNOSIS — N3941 Urge incontinence: Secondary | ICD-10-CM | POA: Diagnosis not present

## 2014-11-26 ENCOUNTER — Other Ambulatory Visit: Payer: Self-pay | Admitting: Family Medicine

## 2014-12-16 ENCOUNTER — Other Ambulatory Visit: Payer: Self-pay | Admitting: Family Medicine

## 2014-12-27 ENCOUNTER — Ambulatory Visit (INDEPENDENT_AMBULATORY_CARE_PROVIDER_SITE_OTHER): Payer: Medicare Other | Admitting: Family Medicine

## 2014-12-27 ENCOUNTER — Encounter: Payer: Self-pay | Admitting: Family Medicine

## 2014-12-27 VITALS — BP 123/77 | HR 71 | Temp 98.0°F | Wt 155.0 lb

## 2014-12-27 DIAGNOSIS — R21 Rash and other nonspecific skin eruption: Secondary | ICD-10-CM | POA: Diagnosis not present

## 2014-12-27 DIAGNOSIS — Z23 Encounter for immunization: Secondary | ICD-10-CM | POA: Diagnosis not present

## 2014-12-27 DIAGNOSIS — G4700 Insomnia, unspecified: Secondary | ICD-10-CM

## 2014-12-27 MED ORDER — OXYCODONE-ACETAMINOPHEN 5-325 MG PO TABS: 1.0000 | ORAL_TABLET | Freq: Two times a day (BID) | ORAL | Status: DC | PRN

## 2014-12-27 MED ORDER — HYDROXYZINE HCL 50 MG PO TABS: 50.0000 mg | ORAL_TABLET | Freq: Three times a day (TID) | ORAL | Status: DC | PRN

## 2014-12-27 MED ORDER — MIRTAZAPINE 15 MG PO TABS: 15.0000 mg | ORAL_TABLET | Freq: Every day | ORAL | Status: DC

## 2014-12-27 NOTE — Progress Notes (Signed)
   Subjective:    Patient ID: Annette Ramirez, female    DOB: 1945/11/08, 69 y.o.   MRN: [id number]  HPI   C/O of rash that started about 9 weeks ago on her shins. Then about 6 weeks ago saw dermatologist 6 weeks ago and they dx her with psoriais Psoriasis - she was given clobetasone. Not really helping.  No fever, chills or sweats.  NO recent URI.  Says no new soaps, lotions, perfumes, detergents, etc.   Insomnia-she really like to get off of the Seroquel. She feels like it's not being as effective as it was. She says her husband takes Remeron injection tried one of his pills and it worked well. She would like to try that instead.   Review of Systems She's also noticed some hair loss recently.    Objective:   Physical Exam  Constitutional: She appears well-developed and well-nourished.  HENT:  Head: Normocephalic and atraumatic.  Skin: Skin is warm and dry. Rash noted.  Psychiatric: Her behavior is normal.    Rash is maculopapular with some patches and plaques. Some excoriations over the lesions. Areas affected are premature over her entire body abdomen, upper chest, back, legs, groin creases, axilla and neck. The face primarily seems to be spared.      Assessment & Plan:  Rash - unclear etiology. Suspect a drug reaction to Vesicare. This was started around the time that the rash started. Encouraged her to stop immediately. Sent over prescription for Atarax to use as needed for the itching she really hasn't had anything to use. Can stop the steroid cream since clearly it's not helping. No other medications have changed recently. Check a CBC as well as a sedimentation rate and CRP. Also consider could be secondary to Lyrica though she has been on this medication for quite some time.  Insomnia-I would actually like to get her off the Seroquel so differently willing to try the Remeron. New prescription sent to the pharmacy and encouraged her to start with half of a tab and then increase  to a whole if needed.  Flu shot given today.

## 2014-12-28 LAB — CBC WITH DIFFERENTIAL/PLATELET
BASOS ABS: 0.1 10*3/uL (ref 0.0–0.1)
Basophils Relative: 1 % (ref 0–1)
Eosinophils Absolute: 0.3 10*3/uL (ref 0.0–0.7)
Eosinophils Relative: 4 % (ref 0–5)
HEMATOCRIT: 39.7 % (ref 36.0–46.0)
HEMOGLOBIN: 13.5 g/dL (ref 12.0–15.0)
LYMPHS ABS: 1.8 10*3/uL (ref 0.7–4.0)
LYMPHS PCT: 27 % (ref 12–46)
MCH: 32.6 pg (ref 26.0–34.0)
MCHC: 34 g/dL (ref 30.0–36.0)
MCV: 95.9 fL (ref 78.0–100.0)
MONOS PCT: 13 % — AB (ref 3–12)
MPV: 11.2 fL (ref 8.6–12.4)
Monocytes Absolute: 0.9 10*3/uL (ref 0.1–1.0)
NEUTROS ABS: 3.6 10*3/uL (ref 1.7–7.7)
NEUTROS PCT: 55 % (ref 43–77)
PLATELETS: 209 10*3/uL (ref 150–400)
RBC: 4.14 MIL/uL (ref 3.87–5.11)
RDW: 12.3 % (ref 11.5–15.5)
WBC: 6.6 10*3/uL (ref 4.0–10.5)

## 2014-12-28 LAB — C-REACTIVE PROTEIN: CRP: <0.5 mg/dL (ref <0.60)

## 2014-12-28 LAB — SEDIMENTATION RATE: Sed Rate: 1 mm/hr (ref 0–30)

## 2014-12-30 NOTE — Addendum Note (Signed)
Addended by: Beatrice Lecher D on: 12/30/2014 12:51 PM   Modules accepted: Orders

## 2015-01-07 DIAGNOSIS — R21 Rash and other nonspecific skin eruption: Secondary | ICD-10-CM | POA: Diagnosis not present

## 2015-01-07 DIAGNOSIS — L239 Allergic contact dermatitis, unspecified cause: Secondary | ICD-10-CM | POA: Diagnosis not present

## 2015-01-15 ENCOUNTER — Other Ambulatory Visit: Payer: Self-pay | Admitting: Family Medicine

## 2015-01-21 DIAGNOSIS — L300 Nummular dermatitis: Secondary | ICD-10-CM | POA: Diagnosis not present

## 2015-02-11 ENCOUNTER — Other Ambulatory Visit: Payer: Self-pay | Admitting: Family Medicine

## 2015-02-26 ENCOUNTER — Other Ambulatory Visit: Payer: Self-pay | Admitting: Family Medicine

## 2015-02-26 ENCOUNTER — Other Ambulatory Visit: Payer: Self-pay | Admitting: Physician Assistant

## 2015-02-26 MED ORDER — OXYCODONE-ACETAMINOPHEN 5-325 MG PO TABS: 1.0000 | ORAL_TABLET | Freq: Two times a day (BID) | ORAL | Status: DC | PRN

## 2015-03-15 ENCOUNTER — Emergency Department (INDEPENDENT_AMBULATORY_CARE_PROVIDER_SITE_OTHER)
Admission: EM | Admit: 2015-03-15 | Discharge: 2015-03-15 | Disposition: A | Discharge status: Home / Self Care | Payer: Medicare Other | Source: Home / Self Care | Attending: Family Medicine | Admitting: Family Medicine

## 2015-03-15 ENCOUNTER — Encounter: Payer: Self-pay | Admitting: Emergency Medicine

## 2015-03-15 ENCOUNTER — Emergency Department (INDEPENDENT_AMBULATORY_CARE_PROVIDER_SITE_OTHER): Payer: Medicare Other

## 2015-03-15 DIAGNOSIS — R0781 Pleurodynia: Secondary | ICD-10-CM | POA: Diagnosis not present

## 2015-03-15 DIAGNOSIS — R079 Chest pain, unspecified: Secondary | ICD-10-CM | POA: Diagnosis not present

## 2015-03-15 LAB — POCT CBC W AUTO DIFF (K'VILLE URGENT CARE)

## 2015-03-15 LAB — D-DIMER, QUANTITATIVE: D-Dimer, Quant: 0.64 ug/mL-FEU — ABNORMAL HIGH (ref 0.00–0.48)

## 2015-03-15 NOTE — ED Notes (Signed)
Patient experiencing left flank area pain after rolling over in bed 6 days ago; yesterday also noticed bruise on abdomen that enlarged over 24 hours. Very painful when coughing.

## 2015-03-15 NOTE — ED Provider Notes (Signed)
CSN: [id number]     Arrival date & time 03/15/15  1516 History   First MD Initiated Contact with Patient 03/15/15 1547     Chief Complaint  Patient presents with  . Flank Pain  . Bleeding/Bruising      HPI Comments: Patient reports that 6 days ago she turned over in bed and felt a "searing" pain in her mid-back that radiated to her left chest.  The pain has persisted and has now migrated to her left lateral chest.  Five days ago she began to notice bruising over her mid-abdomen, although there is no pain there.  Her chest pain is pleuritic when coughing, although she does not have a cough.  She denies shortness of breath.  She recalls no injury to her chest.  No fevers, chills, and sweats  Patient is a 69 y.o. female presenting with chest pain. The history is provided by the patient.  Chest Pain Pain location:  L chest and L lateral chest Pain quality: stabbing and tearing   Pain radiates to the back: yes   Pain severity:  Moderate Onset quality:  Sudden Duration:  6 days Timing:  Constant Progression:  Unchanged Chronicity:  New Context: breathing, lifting, movement and at rest   Relieved by:  Nothing Worsened by:  Coughing, deep breathing and movement Associated symptoms: back pain   Associated symptoms: no abdominal pain, no cough, no diaphoresis, no dysphagia, no fatigue, no fever, no headache, no heartburn, no lower extremity edema, no nausea, no palpitations, no shortness of breath, no syncope and not vomiting   Risk factors: hypertension and smoking     Past Medical History  Diagnosis Date  . COPD (chronic obstructive pulmonary disease) ([place])   . GERD (gastroesophageal reflux disease)   . Hypertension   . Fibromyalgia   . Depression   . Osteoporosis   . PONV (postoperative nausea and vomiting)   . Family history of anesthesia complication     "granddaughter has PONV" (02/28/2013)  . Exertional shortness of breath   . H/O hiatal hernia   . Sinus headache   .  Migraines     "in the past; they've stopped" (02/28/2013)  . Anxiety    Past Surgical History  Procedure Laterality Date  . Tonsillectomy  1950  . Tubal ligation  1972  . Colonoscopy w/ biopsies and polypectomy  2012  . Lumbar laminectomy  02/28/2013  . Appendectomy  1975  . Abdominal hysterectomy  1975  . Dilation and curettage of uterus  1960's  . Excisional hemorrhoidectomy  1980's  . Breast cyst aspiration Left 1980's  . Lumbar laminectomy/decompression microdiscectomy N/A 02/28/2013    Procedure: LUMBAR LAMINECTOMY/DECOMPRESSION MICRODISCECTOMY/Lumbar 4-5 decompression;  Surgeon: Sinclair Ship, MD;  Location: [place];  Service: Orthopedics;  Laterality: N/A;  Lumbar 4-5 decompression   Family History  Problem Relation Age of Onset  . Alzheimer's disease Mother   . Prostate cancer      grandfather  . Stroke      grandparent   Social History  Substance Use Topics  . Smoking status: Current Some Day Smoker -- 0.25 packs/day for 40 years    Types: Cigarettes  . Smokeless tobacco: Never Used     Comment: 02/28/2013 "started smoking eletronic cigarettes a couple years ago"  . Alcohol Use: 3.0 oz/week    5 Glasses of wine per week   OB History    No data available     Review of Systems  Constitutional: Negative for fever,  chills, diaphoresis and fatigue.  HENT: Negative.  Negative for trouble swallowing.   Eyes: Negative.   Respiratory: Negative for cough, shortness of breath and wheezing.   Cardiovascular: Positive for chest pain. Negative for palpitations, leg swelling and syncope.  Gastrointestinal: Negative for heartburn, nausea, vomiting and abdominal pain.  Genitourinary: Negative.   Musculoskeletal: Positive for back pain.  Skin: Positive for color change.  Neurological: Negative for headaches.    Allergies  Amoxicillin-pot clavulanate; Aspirin; and Codeine  Home Medications   Prior to Admission medications   Medication Sig Start Date End Date  Taking? Authorizing Provider  albuterol (PROVENTIL HFA;VENTOLIN HFA) 108 (90 BASE) MCG/ACT inhaler Inhale 2 puffs into the lungs every 6 (six) hours as needed for wheezing. 06/06/14   Hali Marry, MD  ALPRAZolam Duanne Moron) 0.25 MG tablet TAKE 1 TABLET TWICE A DAY AS NEEDED FOR ANXIETY 02/26/15   Hali Marry, MD  atenolol (TENORMIN) 100 MG tablet TAKE ONE TABLET BY MOUTH 2 TIMES A DAY 07/31/14   Jade L Breeback, PA-C  citalopram (CELEXA) 20 MG tablet TAKE 1 TABLET BY MOUTH ONCE DAILY 02/27/15   Jade L Breeback, PA-C  hydrOXYzine (ATARAX/VISTARIL) 50 MG tablet TAKE ONE TABLET BY MOUTH 3 TIMES A DAY AS NEEDED 02/26/15   Hali Marry, MD  losartan-hydrochlorothiazide [facility]) 100-12.5 MG tablet TAKE ONE TABLET BY MOUTH EVERY DAY 02/26/15   Hali Marry, MD  LYRICA 75 MG capsule TAKE 1 CAPSULE BY MOUTH TWICE DAILY 02/27/15   Jade L Breeback, PA-C  mirtazapine (REMERON) 15 MG tablet TAKE ONE TABLET BY MOUTH AT BEDTIME 02/26/15   Hali Marry, MD  omeprazole (PRILOSEC) 40 MG capsule TAKE ONE CAPSULE BY MOUTH EVERY DAY 02/11/15   Hali Marry, MD  oxyCODONE-acetaminophen (ROXICET) 5-325 MG tablet Take 1 tablet by mouth 2 (two) times daily as needed for severe pain. Patient needs to schedule a follow up appointment before more refills. 02/26/15   Hali Marry, MD   Meds Ordered and Administered this Visit  Medications - No data to display  BP 146/79 mmHg  Pulse 60  Resp 18  Ht 5\' 2"  (1.575 m)  SpO2 94% No data found.   Physical Exam  Constitutional: She is oriented to person, place, and time. She appears well-developed and well-nourished. No distress.  HENT:  Head: Normocephalic.  Right Ear: External ear normal.  Left Ear: External ear normal.  Nose: Nose normal.  Mouth/Throat: Oropharynx is clear and moist.  Eyes: Conjunctivae and EOM are normal. Pupils are equal, round, and reactive to light.  Neck: Neck supple.  Cardiovascular: Normal  heart sounds and intact distal pulses.   Pulmonary/Chest: Breath sounds normal. She has no wheezes. She has no rales.   She exhibits tenderness.    Left lateral chest has distinct tenderness to palpation in areas as noted on diagram   Abdominal: She exhibits no distension. There is no tenderness.    Ecchymosis as noted on diagram, non-tender.  Musculoskeletal:       Arms: Midline posterior chest has mild tenderness to palpation as noted on diagram.  No lower leg edema or calf tenderness    Lymphadenopathy:    She has no cervical adenopathy.  Neurological: She is alert and oriented to person, place, and time.  Skin: Skin is warm and dry. No rash noted. She is not diaphoretic.  Nursing note and vitals reviewed.   ED Course  Procedures  Labs Reviewed  D-DIMER, QUANTITATIVE (NOT AT [facility])  POCT CBC W AUTO DIFF ([facility]):  WBC 6.5; LY 29.7; MO 2.0; GR 68.3; Hgb 12.6; Platelets 187     Imaging Review Dg Ribs Unilateral W/chest Left  03/15/2015  CLINICAL DATA:  Left rib injury, posterior and lateral side pain. Pain increases with coughing. Site of pain marked with a BB. History of hypertension, COPD, smoking. EXAM: LEFT RIBS AND CHEST - 3+ VIEW COMPARISON:  Chest x-ray dated 02/22/2013. FINDINGS: Single view of the chest and 2 dedicated views of the left ribs are provided. There is a slight deformity of the left lateral tenth rib which is likely old but not clearly seen on previous chest x-rays. Remainder of the left-sided ribs appear intact and well aligned. Heart size is upper normal, unchanged. Overall cardiomediastinal silhouette is stable in size and configuration. Probable mild atelectasis/scarring at each lung base. Lungs otherwise clear. IMPRESSION: Slight deformity of the left lateral tenth rib which is likely old but not clearly seen on previous chest x-rays. Burtis Junes old healed fracture. No acute- appearing fracture or dislocation seen within the left ribs. No evidence  of acute cardiopulmonary abnormality. Electronically Signed   By: Franki Cabot M.D.   On: 03/15/2015 16:41      MDM   1. Rib pain on left side ?etiology   Based on patient's description of her pain, its evolution over time, and ecchymosis of the peri-umbilical area (Cullen's sign), concern for dissecting AAA.  PE also possible. D-dimer pending; if positive will order CT angio chest aorta w/contrast and CTA abd/pelvis w/contrast. Sharma Covert  Apply ice pack for 15 to 20 minutes, 3 to 4 times daily.  Continue until pain decreases.  May continue to wear rib belt intermittently.  Continue pain medication as needed. If symptoms become significantly worse during the night or over the weekend, proceed to the local emergency room.   Addendum: D-dimer elevated. CTA ordered as above  Kandra Nicolas, MD 03/20/15 [phone]

## 2015-03-15 NOTE — Discharge Instructions (Signed)
Apply ice pack for 15 to 20 minutes, 3 to 4 times daily.  Continue until pain decreases.  May continue to wear rib belt intermittently.  Continue pain medication as needed. If symptoms become significantly worse during the night or over the weekend, proceed to the local emergency room.

## 2015-03-16 ENCOUNTER — Ambulatory Visit (HOSPITAL_BASED_OUTPATIENT_CLINIC_OR_DEPARTMENT_OTHER)
Admission: RE | Admit: 2015-03-16 | Discharge: 2015-03-16 | Disposition: A | Discharge status: Home / Self Care | Payer: Medicare Other | Source: Ambulatory Visit | Attending: Family Medicine | Admitting: Family Medicine

## 2015-03-16 ENCOUNTER — Encounter (HOSPITAL_BASED_OUTPATIENT_CLINIC_OR_DEPARTMENT_OTHER): Payer: Self-pay

## 2015-03-16 DIAGNOSIS — J439 Emphysema, unspecified: Secondary | ICD-10-CM | POA: Diagnosis not present

## 2015-03-16 DIAGNOSIS — I700 Atherosclerosis of aorta: Secondary | ICD-10-CM | POA: Diagnosis not present

## 2015-03-16 DIAGNOSIS — R079 Chest pain, unspecified: Secondary | ICD-10-CM | POA: Diagnosis not present

## 2015-03-16 DIAGNOSIS — R93422 Abnormal radiologic findings on diagnostic imaging of left kidney: Secondary | ICD-10-CM | POA: Insufficient documentation

## 2015-03-16 DIAGNOSIS — R109 Unspecified abdominal pain: Secondary | ICD-10-CM | POA: Insufficient documentation

## 2015-03-16 DIAGNOSIS — R112 Nausea with vomiting, unspecified: Secondary | ICD-10-CM | POA: Insufficient documentation

## 2015-03-16 DIAGNOSIS — R791 Abnormal coagulation profile: Secondary | ICD-10-CM | POA: Diagnosis not present

## 2015-03-16 DIAGNOSIS — I517 Cardiomegaly: Secondary | ICD-10-CM | POA: Diagnosis not present

## 2015-03-16 DIAGNOSIS — I714 Abdominal aortic aneurysm, without rupture: Secondary | ICD-10-CM | POA: Diagnosis not present

## 2015-03-16 DIAGNOSIS — I712 Thoracic aortic aneurysm, without rupture: Secondary | ICD-10-CM | POA: Diagnosis not present

## 2015-03-16 HISTORY — DX: Unspecified asthma, uncomplicated: J45.909

## 2015-03-16 LAB — COMPLETE METABOLIC PANEL WITH GFR
ALT: 19 U/L (ref 6–29)
AST: 26 U/L (ref 10–35)
Albumin: 4.4 g/dL (ref 3.6–5.1)
Alkaline Phosphatase: 60 U/L (ref 33–130)
BUN: 15 mg/dL (ref 7–25)
CHLORIDE: 95 mmol/L — AB (ref 98–110)
CO2: 34 mmol/L — AB (ref 20–31)
Calcium: 9.3 mg/dL (ref 8.6–10.4)
Creat: 0.73 mg/dL (ref 0.60–0.93)
GFR, EST NON AFRICAN AMERICAN: 83 mL/min (ref >=60)
Glucose, Bld: 109 mg/dL — ABNORMAL HIGH (ref 65–99)
Potassium: 5.2 mmol/L (ref 3.5–5.3)
Sodium: 137 mmol/L (ref 135–146)
Total Bilirubin: 1.1 mg/dL (ref 0.2–1.2)
Total Protein: 6.4 g/dL (ref 6.1–8.1)

## 2015-03-16 MED ORDER — IOHEXOL 350 MG/ML SOLN
100.0000 mL | Freq: Once | INTRAVENOUS | Status: AC | PRN
  Administered 2015-03-16: 100 mL via INTRAVENOUS

## 2015-03-17 ENCOUNTER — Encounter: Payer: Self-pay | Admitting: Family Medicine

## 2015-03-17 ENCOUNTER — Ambulatory Visit (INDEPENDENT_AMBULATORY_CARE_PROVIDER_SITE_OTHER): Payer: Medicare Other | Admitting: Family Medicine

## 2015-03-17 VITALS — BP 160/84 | HR 66 | Temp 98.2°F | Wt 156.0 lb

## 2015-03-17 DIAGNOSIS — R109 Unspecified abdominal pain: Secondary | ICD-10-CM | POA: Diagnosis not present

## 2015-03-17 DIAGNOSIS — I700 Atherosclerosis of aorta: Secondary | ICD-10-CM | POA: Insufficient documentation

## 2015-03-17 DIAGNOSIS — R58 Hemorrhage, not elsewhere classified: Secondary | ICD-10-CM

## 2015-03-17 DIAGNOSIS — D649 Anemia, unspecified: Secondary | ICD-10-CM | POA: Diagnosis not present

## 2015-03-17 DIAGNOSIS — R0781 Pleurodynia: Secondary | ICD-10-CM | POA: Diagnosis not present

## 2015-03-17 DIAGNOSIS — R079 Chest pain, unspecified: Secondary | ICD-10-CM | POA: Diagnosis not present

## 2015-03-17 MED ORDER — ONDANSETRON HCL 4 MG PO TABS: 4.0000 mg | ORAL_TABLET | Freq: Three times a day (TID) | ORAL | Status: DC | PRN

## 2015-03-17 MED ORDER — OXYCODONE-ACETAMINOPHEN 5-325 MG PO TABS: 1.0000 | ORAL_TABLET | Freq: Four times a day (QID) | ORAL | Status: DC | PRN

## 2015-03-17 NOTE — Assessment & Plan Note (Signed)
The etiology of the patient's symptoms is unclear at this time. I think the most likely explanation is that she tore one of her intercostal muscles or one of the abdominal wall muscles. This would explain the pain and the delayed onset of ecchymosis on her abdomen. However she has other symptoms that are not entirely consistent with this finding. At this time I feel is reasonable to continue the work up and have prompt follow-up. We'll obtain an abdominal ultrasound to look for other structures that may not have been well visualized with a CT angiogram such as the spleen and liver. Additionally we'll recheck metabolic panel CK and CBC. Additionally obtain ferritin fibrinogen to look for DIC, and lipase. Patient will follow-up Wednesday (2 days from today). Lengthy discussion about red flag signs or symptoms. Return sooner if worsening. Additionally we'll prescribe more oxycodone for pain control and Zofran for nausea.

## 2015-03-17 NOTE — Progress Notes (Signed)
Annette Ramirez is a 69 y.o. female who presents to [facility]: [facility]  today for left flank and abdominal pain. Patient has a one-week history of sudden onset of left posterior chest wall/flank pain. This occurred without injury. The pain is been waxing and waning for the past week. She developed ecchymosis onto her upper abdomen over the last several days. She was seen in urgent care 2 days ago where she had an extensive workup including x-ray of her ribs which was negative for acute fracture as well as laboratory tests which were significant for an elevated d-dimer. Patient notes that she does not feel very well. She feels nauseated and lightheaded and not quite herself. She is quite concerned that something more serious going on inside of her belly or abdomen. She denies any chest pains or palpitations or shortness of breath. She adamantly denies any injury.  She was sent for CT angiogram of her chest abdomen and pelvis to evaluate for pulmonary embolism or aortic dissection or aneurysm. This did not show any acute or serious findings.   Past Medical History  Diagnosis Date  . COPD (chronic obstructive pulmonary disease) ([place])   . GERD (gastroesophageal reflux disease)   . Hypertension   . Fibromyalgia   . Depression   . Osteoporosis   . PONV (postoperative nausea and vomiting)   . Family history of anesthesia complication     "granddaughter has PONV" (02/28/2013)  . Exertional shortness of breath   . H/O hiatal hernia   . Sinus headache   . Migraines     "in the past; they've stopped" (02/28/2013)  . Anxiety   . Asthma    Past Surgical History  Procedure Laterality Date  . Tonsillectomy  1950  . Tubal ligation  1972  . Colonoscopy w/ biopsies and polypectomy  2012  . Lumbar laminectomy  02/28/2013  . Appendectomy  1975  . Abdominal hysterectomy  1975  . Dilation and curettage of uterus  1960's  . Excisional hemorrhoidectomy  1980's  . Breast cyst  aspiration Left 1980's  . Lumbar laminectomy/decompression microdiscectomy N/A 02/28/2013    Procedure: LUMBAR LAMINECTOMY/DECOMPRESSION MICRODISCECTOMY/Lumbar 4-5 decompression;  Surgeon: Sinclair Ship, MD;  Location: [facility];  Service: Orthopedics;  Laterality: N/A;  Lumbar 4-5 decompression   Social History  Substance Use Topics  . Smoking status: Current Some Day Smoker -- 0.25 packs/day for 40 years    Types: Cigarettes  . Smokeless tobacco: Never Used     Comment: 02/28/2013 "started smoking eletronic cigarettes a couple years ago"  . Alcohol Use: 3.0 oz/week    5 Glasses of wine per week   family history includes Alzheimer's disease in her mother; Prostate cancer in an other family member; Stroke in an other family member.  ROS as above Medications: Current Outpatient Prescriptions  Medication Sig Dispense Refill  . albuterol (PROVENTIL HFA;VENTOLIN HFA) 108 (90 BASE) MCG/ACT inhaler Inhale 2 puffs into the lungs every 6 (six) hours as needed for wheezing. 18 g 5  . ALPRAZolam (XANAX) 0.25 MG tablet TAKE 1 TABLET TWICE A DAY AS NEEDED FOR ANXIETY 60 tablet 0  . atenolol (TENORMIN) 100 MG tablet TAKE ONE TABLET BY MOUTH 2 TIMES A DAY 180 tablet 1  . citalopram (CELEXA) 20 MG tablet TAKE 1 TABLET BY MOUTH ONCE DAILY 90 tablet 0  . hydrOXYzine (ATARAX/VISTARIL) 50 MG tablet TAKE ONE TABLET BY MOUTH 3 TIMES A DAY AS NEEDED 60 tablet 0  . losartan-hydrochlorothiazide (HYZAAR)  100-12.5 MG tablet TAKE ONE TABLET BY MOUTH EVERY DAY 90 tablet 0  . LYRICA 75 MG capsule TAKE 1 CAPSULE BY MOUTH TWICE DAILY 180 capsule 0  . mirtazapine (REMERON) 15 MG tablet TAKE ONE TABLET BY MOUTH AT BEDTIME 30 tablet 0  . omeprazole (PRILOSEC) 40 MG capsule TAKE ONE CAPSULE BY MOUTH EVERY DAY 90 capsule 0  . oxyCODONE-acetaminophen (ROXICET) 5-325 MG tablet Take 1 tablet by mouth every 6 (six) hours as needed for severe pain. 60 tablet 0  . ondansetron (ZOFRAN) 4 MG tablet Take 1 tablet (4 mg total)  by mouth every 8 (eight) hours as needed for nausea or vomiting. 20 tablet 0   No current facility-administered medications for this visit.   Allergies  Allergen Reactions  . Amoxicillin-Pot Clavulanate     REACTION: nausea and abdominal pain  . Aspirin     REACTION: upset stomach  . Codeine     REACTION: nausea     Exam:  BP 160/84 mmHg  Pulse 66  Temp(Src) 98.2 F (36.8 C) (Oral)  Wt 156 lb (70.761 kg)  SpO2 93% Gen: Well NAD nontoxic appearing HEENT: EOMI,  MMM Lungs: Normal work of breathing. CTABL Heart: RRR no MRG Abd: NABS, Soft. Nondistended, significant ecchymosis present near the area above the umbilicus and slightly left lateral. Additionally she has a dense patch of ecchymosis at the lower abdomen at the pelvis at the old scar from a hysterectomy. minimally tender without rebound or guarding. Nontender overlying ecchymosis Exts: Brisk capillary refill, warm and well perfused.   No results found for this or any previous visit (from the past 24 hour(s)). Ct Angio Chest Aorta W/cm &/or Wo/cm  03/16/2015  CLINICAL DATA:  Left flank pain 6 days. Bruising over abdominal wall near umbilicus. No injury. Nausea and vomiting. Elevated D-dimer. EXAM: CT ANGIOGRAPHY CHEST, ABDOMEN AND PELVIS TECHNIQUE: Multidetector CT imaging through the chest, abdomen and pelvis was performed using the standard protocol during bolus administration of intravenous contrast. Multiplanar reconstructed images and MIPs were obtained and reviewed to evaluate the vascular anatomy. CONTRAST:  112mL OMNIPAQUE IOHEXOL 350 MG/ML SOLN COMPARISON:  None. FINDINGS: CTA CHEST FINDINGS Lungs are adequately inflated with mild centrilobular emphysematous disease over the mid to upper lungs. Mild biapical pleural thickening with slight nodularity to the right apical pleura pleural thickening. No focal consolidation or effusion. Airways are within normal. Mild cardiomegaly. Pulmonary arterial system is normal without  evidence of emboli. There is calcified plaque over the thoracic aorta. There is no mediastinal, hilar or axillary adenopathy. Review of the MIP images confirms the above findings. CTA ABDOMEN AND PELVIS FINDINGS Abdominal aorta is normal caliber without evidence of dissection or aneurysm. There is minimal calcified plaque over the abdominal aorta. Iliac arteries are patent. The celiac axis, superior mesenteric artery and inferior mesenteric arteries are patent. Single bilateral renal arteries are within normal. Surgical absence of the appendix as history states prior appendectomy. The liver, spleen, pancreas, gallbladder and adrenal glands are within normal. Stomach is normal. Kidneys are normal in size without hydronephrosis or nephrolithiasis. There is a sub cm hypodensity over the mid pole left renal cortex too small to characterize but likely a cyst. Ureters are normal. Cecum is located in the midline mid abdomen as the ileocecal junction is difficult to define. Just proximal to the cecum, there are several small linear air containing areas as cannot exclude enteroenteric fistula in this region. Findings are likely postsurgical. There is narrowing of the ascending colon. No  mass, inflammatory change or free fluid. No definite free peritoneal air. Pelvic images demonstrate the bladder and rectum to be within normal. There is surgical absence of the uterus. No free fluid. Right gluteal lipoma. Mild degenerate change of the spine and hips. Review of the MIP images confirms the above findings. IMPRESSION: Mild atherosclerotic disease of the thoracoabdominal aorta without evidence of aneurysm or dissection. No evidence of pulmonary embolism. Mild emphysematous disease.  Mild cardiomegaly. No acute findings in the abdomen/pelvis. Cecum midline with ileocecal junction difficult to define in changes just proximal to the cecum likely postsurgical as cannot exclude an enteroenteric fistula in this location. No free fluid,  acute inflammation or obstruction. No definite free peritoneal air. Sub cm left renal cortical hypodensities too small to characterize but likely a cyst. Electronically Signed   By: Marin Olp M.D.   On: 03/16/2015 17:05   Ct Cta Abd/pel W/cm &/or W/o Cm  03/16/2015  CLINICAL DATA:  Left flank pain 6 days. Bruising over abdominal wall near umbilicus. No injury. Nausea and vomiting. Elevated D-dimer. EXAM: CT ANGIOGRAPHY CHEST, ABDOMEN AND PELVIS TECHNIQUE: Multidetector CT imaging through the chest, abdomen and pelvis was performed using the standard protocol during bolus administration of intravenous contrast. Multiplanar reconstructed images and MIPs were obtained and reviewed to evaluate the vascular anatomy. CONTRAST:  123mL OMNIPAQUE IOHEXOL 350 MG/ML SOLN COMPARISON:  None. FINDINGS: CTA CHEST FINDINGS Lungs are adequately inflated with mild centrilobular emphysematous disease over the mid to upper lungs. Mild biapical pleural thickening with slight nodularity to the right apical pleura pleural thickening. No focal consolidation or effusion. Airways are within normal. Mild cardiomegaly. Pulmonary arterial system is normal without evidence of emboli. There is calcified plaque over the thoracic aorta. There is no mediastinal, hilar or axillary adenopathy. Review of the MIP images confirms the above findings. CTA ABDOMEN AND PELVIS FINDINGS Abdominal aorta is normal caliber without evidence of dissection or aneurysm. There is minimal calcified plaque over the abdominal aorta. Iliac arteries are patent. The celiac axis, superior mesenteric artery and inferior mesenteric arteries are patent. Single bilateral renal arteries are within normal. Surgical absence of the appendix as history states prior appendectomy. The liver, spleen, pancreas, gallbladder and adrenal glands are within normal. Stomach is normal. Kidneys are normal in size without hydronephrosis or nephrolithiasis. There is a sub cm hypodensity  over the mid pole left renal cortex too small to characterize but likely a cyst. Ureters are normal. Cecum is located in the midline mid abdomen as the ileocecal junction is difficult to define. Just proximal to the cecum, there are several small linear air containing areas as cannot exclude enteroenteric fistula in this region. Findings are likely postsurgical. There is narrowing of the ascending colon. No mass, inflammatory change or free fluid. No definite free peritoneal air. Pelvic images demonstrate the bladder and rectum to be within normal. There is surgical absence of the uterus. No free fluid. Right gluteal lipoma. Mild degenerate change of the spine and hips. Review of the MIP images confirms the above findings. IMPRESSION: Mild atherosclerotic disease of the thoracoabdominal aorta without evidence of aneurysm or dissection. No evidence of pulmonary embolism. Mild emphysematous disease.  Mild cardiomegaly. No acute findings in the abdomen/pelvis. Cecum midline with ileocecal junction difficult to define in changes just proximal to the cecum likely postsurgical as cannot exclude an enteroenteric fistula in this location. No free fluid, acute inflammation or obstruction. No definite free peritoneal air. Sub cm left renal cortical hypodensities too small to  characterize but likely a cyst. Electronically Signed   By: Marin Olp M.D.   On: 03/16/2015 17:05     Please see individual assessment and plan sections.

## 2015-03-17 NOTE — Patient Instructions (Signed)
Thank you for coming in today. Return Wednesday.  If your belly pain worsens, or you have high fever, bad vomiting, blood in your stool or black tarry stool go to the Emergency Room.  Call or go to the emergency room if you get worse, have trouble breathing, have chest pains, or palpitations.

## 2015-03-18 ENCOUNTER — Ambulatory Visit (INDEPENDENT_AMBULATORY_CARE_PROVIDER_SITE_OTHER): Payer: Medicare Other

## 2015-03-18 DIAGNOSIS — R58 Hemorrhage, not elsewhere classified: Secondary | ICD-10-CM

## 2015-03-18 DIAGNOSIS — R109 Unspecified abdominal pain: Secondary | ICD-10-CM | POA: Diagnosis not present

## 2015-03-18 DIAGNOSIS — R0781 Pleurodynia: Secondary | ICD-10-CM

## 2015-03-18 LAB — COMPREHENSIVE METABOLIC PANEL
ALBUMIN: 4.3 g/dL (ref 3.6–5.1)
ALT: 17 U/L (ref 6–29)
AST: 25 U/L (ref 10–35)
Alkaline Phosphatase: 55 U/L (ref 33–130)
BUN: 12 mg/dL (ref 7–25)
CALCIUM: 9.6 mg/dL (ref 8.6–10.4)
CHLORIDE: 96 mmol/L — AB (ref 98–110)
CO2: 34 mmol/L — AB (ref 20–31)
CREATININE: 0.77 mg/dL (ref 0.60–0.93)
Glucose, Bld: 95 mg/dL (ref 65–99)
POTASSIUM: 4.8 mmol/L (ref 3.5–5.3)
SODIUM: 137 mmol/L (ref 135–146)
TOTAL PROTEIN: 6.3 g/dL (ref 6.1–8.1)
Total Bilirubin: 0.8 mg/dL (ref 0.2–1.2)

## 2015-03-18 LAB — CBC
HEMATOCRIT: 38.5 % (ref 36.0–46.0)
Hemoglobin: 13.5 g/dL (ref 12.0–15.0)
MCH: 33.6 pg (ref 26.0–34.0)
MCHC: 35.1 g/dL (ref 30.0–36.0)
MCV: 95.8 fL (ref 78.0–100.0)
MPV: 10.7 fL (ref 8.6–12.4)
Platelets: 254 10*3/uL (ref 150–400)
RBC: 4.02 MIL/uL (ref 3.87–5.11)
RDW: 13.3 % (ref 11.5–15.5)
WBC: 7.1 10*3/uL (ref 4.0–10.5)

## 2015-03-18 LAB — CK: Total CK: 127 U/L (ref 7–177)

## 2015-03-18 LAB — LIPASE: LIPASE: 19 U/L (ref 7–60)

## 2015-03-18 LAB — FERRITIN: FERRITIN: 145 ng/mL (ref 10–291)

## 2015-03-18 LAB — FIBRINOGEN: FIBRINOGEN: 237 mg/dL (ref 204–475)

## 2015-03-18 NOTE — Progress Notes (Signed)
Quick Note:  Labs so far are normal. Awaiting results of 1 lab and the ultrasound. ______

## 2015-03-19 NOTE — Progress Notes (Signed)
Quick Note:  Ultrasound also normal. ______

## 2015-03-20 ENCOUNTER — Encounter: Payer: Self-pay | Admitting: Family Medicine

## 2015-03-20 ENCOUNTER — Ambulatory Visit (INDEPENDENT_AMBULATORY_CARE_PROVIDER_SITE_OTHER): Payer: Medicare Other | Admitting: Family Medicine

## 2015-03-20 ENCOUNTER — Telehealth: Payer: Self-pay | Admitting: Family Medicine

## 2015-03-20 VITALS — BP 140/80 | HR 60 | Wt 156.0 lb

## 2015-03-20 DIAGNOSIS — R58 Hemorrhage, not elsewhere classified: Secondary | ICD-10-CM

## 2015-03-20 DIAGNOSIS — R109 Unspecified abdominal pain: Secondary | ICD-10-CM

## 2015-03-20 LAB — POCT INR: INR: 1

## 2015-03-20 NOTE — Progress Notes (Signed)
Annette Ramirez is a 69 y.o. female who presents to [facility]: [facility]  today for follow-up abdominal pain. Patient was seen several days ago for follow-up flank pain. She was found to have significant ecchymosis on her abdomen and was generally not feeling very well. Etiology at that visit was very unclear. At that point she had labs EKGs and CT angiogram of her chest abdomen and pelvis that of all been reassuring. The interval she's had repeat labs which are also reassuring, and abdominal ultrasound which was normal.  She states that she is feeling a bit better since the last visit and is quite reassured. She is able to eat and drink ambulate and breathes normally.   Past Medical History  Diagnosis Date  . COPD (chronic obstructive pulmonary disease) ([place])   . GERD (gastroesophageal reflux disease)   . Hypertension   . Fibromyalgia   . Depression   . Osteoporosis   . PONV (postoperative nausea and vomiting)   . Family history of anesthesia complication     "granddaughter has PONV" (02/28/2013)  . Exertional shortness of breath   . H/O hiatal hernia   . Sinus headache   . Migraines     "in the past; they've stopped" (02/28/2013)  . Anxiety   . Asthma    Past Surgical History  Procedure Laterality Date  . Tonsillectomy  1950  . Tubal ligation  1972  . Colonoscopy w/ biopsies and polypectomy  2012  . Lumbar laminectomy  02/28/2013  . Appendectomy  1975  . Abdominal hysterectomy  1975  . Dilation and curettage of uterus  1960's  . Excisional hemorrhoidectomy  1980's  . Breast cyst aspiration Left 1980's  . Lumbar laminectomy/decompression microdiscectomy N/A 02/28/2013    Procedure: LUMBAR LAMINECTOMY/DECOMPRESSION MICRODISCECTOMY/Lumbar 4-5 decompression;  Surgeon: Sinclair Ship, MD;  Location: [facility];  Service: Orthopedics;  Laterality: N/A;  Lumbar 4-5 decompression   Social History  Substance Use Topics  . Smoking status: Current Some Day  Smoker -- 0.25 packs/day for 40 years    Types: Cigarettes  . Smokeless tobacco: Never Used     Comment: 02/28/2013 "started smoking eletronic cigarettes a couple years ago"  . Alcohol Use: 3.0 oz/week    5 Glasses of wine per week   family history includes Alzheimer's disease in her mother; Prostate cancer in an other family member; Stroke in an other family member.  ROS as above Medications: Current Outpatient Prescriptions  Medication Sig Dispense Refill  . albuterol (PROVENTIL HFA;VENTOLIN HFA) 108 (90 BASE) MCG/ACT inhaler Inhale 2 puffs into the lungs every 6 (six) hours as needed for wheezing. 18 g 5  . ALPRAZolam (XANAX) 0.25 MG tablet TAKE 1 TABLET TWICE A DAY AS NEEDED FOR ANXIETY 60 tablet 0  . atenolol (TENORMIN) 100 MG tablet TAKE ONE TABLET BY MOUTH 2 TIMES A DAY 180 tablet 1  . citalopram (CELEXA) 20 MG tablet TAKE 1 TABLET BY MOUTH ONCE DAILY 90 tablet 0  . hydrOXYzine (ATARAX/VISTARIL) 50 MG tablet TAKE ONE TABLET BY MOUTH 3 TIMES A DAY AS NEEDED 60 tablet 0  . losartan-hydrochlorothiazide (HYZAAR) 100-12.5 MG tablet TAKE ONE TABLET BY MOUTH EVERY DAY 90 tablet 0  . LYRICA 75 MG capsule TAKE 1 CAPSULE BY MOUTH TWICE DAILY 180 capsule 0  . mirtazapine (REMERON) 15 MG tablet TAKE ONE TABLET BY MOUTH AT BEDTIME 30 tablet 0  . omeprazole (PRILOSEC) 40 MG capsule TAKE ONE CAPSULE BY MOUTH EVERY DAY 90 capsule 0  .  ondansetron (ZOFRAN) 4 MG tablet Take 1 tablet (4 mg total) by mouth every 8 (eight) hours as needed for nausea or vomiting. 20 tablet 0  . oxyCODONE-acetaminophen (ROXICET) 5-325 MG tablet Take 1 tablet by mouth every 6 (six) hours as needed for severe pain. 60 tablet 0   No current facility-administered medications for this visit.   Allergies  Allergen Reactions  . Amoxicillin-Pot Clavulanate     REACTION: nausea and abdominal pain  . Aspirin     REACTION: upset stomach  . Codeine     REACTION: nausea     Exam:  BP 140/80 mmHg  Pulse 60  Wt 156 lb  (70.761 kg) Gen: Well NAD nontoxic appearing HEENT: EOMI,  MMM  Abd: NABS, Soft. Nondistended, mildly tender left flank no masses palpated Exts: Brisk capillary refill, warm and well perfused.  Skin: Continued ecchymosis on her abdomen as previously described.  Results for orders placed or performed in visit on 03/20/15 (from the past 24 hour(s))  POCT INR     Status: Normal   Collection Time: 03/20/15  2:46 PM  Result Value Ref Range   INR 1.0    No results found.   Please see individual assessment and plan sections.

## 2015-03-20 NOTE — Assessment & Plan Note (Signed)
At this point with all of the imaging studies and laboratory examinations and physical exam I think is most likely that the patient or one of the muscles of her trunk resulting in bleeding and subsequent ecchymosis on her abdomen. She is improving with watchful waiting. Plan to continue watchful waiting. If her pain continues to be present and not improving I would recommend a trial of physical therapy. Patient expresses understanding and agreement. Follow-up with PCP

## 2015-03-20 NOTE — Patient Instructions (Signed)
Thank you for coming in today. Return with PCP as needed.  If your belly pain worsens, or you have high fever, bad vomiting, blood in your stool or black tarry stool go to the Emergency Room.  Let me know if you want to go to PT.

## 2015-03-20 NOTE — Telephone Encounter (Signed)
Ok with me 

## 2015-03-20 NOTE — Assessment & Plan Note (Signed)
Will likely resolve on its own

## 2015-03-20 NOTE — Telephone Encounter (Signed)
Dr Madilyn Fireman, Pt called. She wants to switch to Dr. Georgina Snell. Is this okay with both of you?

## 2015-03-20 NOTE — Telephone Encounter (Signed)
Okay with me 

## 2015-03-21 ENCOUNTER — Ambulatory Visit: Payer: Self-pay | Admitting: Family Medicine

## 2015-03-21 ENCOUNTER — Other Ambulatory Visit: Payer: Self-pay | Admitting: Physician Assistant

## 2015-03-25 ENCOUNTER — Other Ambulatory Visit: Payer: Self-pay | Admitting: Family Medicine

## 2015-03-25 ENCOUNTER — Other Ambulatory Visit: Payer: Self-pay | Admitting: Physician Assistant

## 2015-05-09 ENCOUNTER — Other Ambulatory Visit: Payer: Self-pay | Admitting: Family Medicine

## 2015-05-09 ENCOUNTER — Other Ambulatory Visit: Payer: Self-pay | Admitting: Physician Assistant

## 2015-05-13 ENCOUNTER — Ambulatory Visit (INDEPENDENT_AMBULATORY_CARE_PROVIDER_SITE_OTHER): Payer: Medicare Other | Admitting: Family Medicine

## 2015-05-13 ENCOUNTER — Encounter: Payer: Self-pay | Admitting: Family Medicine

## 2015-05-13 VITALS — BP 126/66 | HR 69 | Wt 160.0 lb

## 2015-05-13 DIAGNOSIS — I700 Atherosclerosis of aorta: Secondary | ICD-10-CM

## 2015-05-13 DIAGNOSIS — J441 Chronic obstructive pulmonary disease with (acute) exacerbation: Secondary | ICD-10-CM | POA: Diagnosis not present

## 2015-05-13 DIAGNOSIS — I10 Essential (primary) hypertension: Secondary | ICD-10-CM | POA: Diagnosis not present

## 2015-05-13 DIAGNOSIS — Z23 Encounter for immunization: Secondary | ICD-10-CM | POA: Diagnosis not present

## 2015-05-13 DIAGNOSIS — Z9189 Other specified personal risk factors, not elsewhere classified: Secondary | ICD-10-CM

## 2015-05-13 DIAGNOSIS — M797 Fibromyalgia: Secondary | ICD-10-CM

## 2015-05-13 DIAGNOSIS — M810 Age-related osteoporosis without current pathological fracture: Secondary | ICD-10-CM

## 2015-05-13 DIAGNOSIS — F411 Generalized anxiety disorder: Secondary | ICD-10-CM

## 2015-05-13 MED ORDER — ATENOLOL 100 MG PO TABS: 100.0000 mg | ORAL_TABLET | Freq: Two times a day (BID) | ORAL | Status: DC

## 2015-05-13 MED ORDER — ALPRAZOLAM 0.25 MG PO TABS: 0.2500 mg | ORAL_TABLET | Freq: Two times a day (BID) | ORAL | Status: DC | PRN

## 2015-05-13 MED ORDER — OXYCODONE-ACETAMINOPHEN 5-325 MG PO TABS: 1.0000 | ORAL_TABLET | Freq: Four times a day (QID) | ORAL | Status: DC | PRN

## 2015-05-13 MED ORDER — ZOSTER VACCINE LIVE 19400 UNT/0.65ML ~~LOC~~ SOLR: 0.6500 mL | Freq: Once | SUBCUTANEOUS | Status: DC

## 2015-05-13 MED ORDER — VARICELLA VIRUS VACCINE LIVE 1350 PFU/0.5ML IJ SUSR: 0.5000 mL | Freq: Once | INTRAMUSCULAR | Status: DC

## 2015-05-13 MED ORDER — OMEPRAZOLE 40 MG PO CPDR: 40.0000 mg | DELAYED_RELEASE_CAPSULE | Freq: Every day | ORAL | Status: DC

## 2015-05-13 MED ORDER — LOSARTAN POTASSIUM-HCTZ 100-12.5 MG PO TABS: 1.0000 | ORAL_TABLET | Freq: Every day | ORAL | Status: DC

## 2015-05-13 NOTE — Assessment & Plan Note (Signed)
Refill medication. Follow with PCP

## 2015-05-13 NOTE — Progress Notes (Signed)
Annette Ramirez is a 70 y.o. female who presents to [facility]: Primary Care today for discuss hypertension, anxiety, reflux and pain.   1) anxiety: Patient is pretty well with Xanax. She would like a refill today if possible. She additionally takes mirtazapine which works well.  2) hypertension: Doing well with atenolol and losartan/hydrochlorothiazide. No new chest pains palpitations or shortness of breath.  3) pain: Patient has chronic pain. She takes oxycodone intermittently. She typically takes 1 pill or less per day.  4) health maintenance: Patient would like a shingles vaccine today and a tetanus vaccine today. She's not sure if she should have a further bone density testing she's already been diagnosed with osteoporosis and has already completed a course of bisphosphonates.   Past Medical History  Diagnosis Date  . COPD (chronic obstructive pulmonary disease) ([place])   . GERD (gastroesophageal reflux disease)   . Hypertension   . Fibromyalgia   . Depression   . Osteoporosis   . PONV (postoperative nausea and vomiting)   . Family history of anesthesia complication     "granddaughter has PONV" (02/28/2013)  . Exertional shortness of breath   . H/O hiatal hernia   . Sinus headache   . Migraines     "in the past; they've stopped" (02/28/2013)  . Anxiety   . Asthma    Past Surgical History  Procedure Laterality Date  . Tonsillectomy  1950  . Tubal ligation  1972  . Colonoscopy w/ biopsies and polypectomy  2012  . Lumbar laminectomy  02/28/2013  . Appendectomy  1975  . Abdominal hysterectomy  1975  . Dilation and curettage of uterus  1960's  . Excisional hemorrhoidectomy  1980's  . Breast cyst aspiration Left 1980's  . Lumbar laminectomy/decompression microdiscectomy N/A 02/28/2013    Procedure: LUMBAR LAMINECTOMY/DECOMPRESSION MICRODISCECTOMY/Lumbar 4-5 decompression;  Surgeon:  Sinclair Ship, MD;  Location: [facility];  Service: Orthopedics;  Laterality: N/A;  Lumbar 4-5 decompression   Social History  Substance Use Topics  . Smoking status: Current Some Day Smoker -- 0.25 packs/day for 40 years    Types: Cigarettes  . Smokeless tobacco: Never Used     Comment: 02/28/2013 "started smoking eletronic cigarettes a couple years ago"  . Alcohol Use: 3.0 oz/week    5 Glasses of wine per week   family history includes Alzheimer's disease in her mother.  ROS as above Medications: Current Outpatient Prescriptions  Medication Sig Dispense Refill  . albuterol (PROVENTIL HFA;VENTOLIN HFA) 108 (90 BASE) MCG/ACT inhaler Inhale 2 puffs into the lungs every 6 (six) hours as needed for wheezing. 18 g 5  . ALPRAZolam (XANAX) 0.25 MG tablet Take 1 tablet (0.25 mg total) by mouth 2 (two) times daily as needed for anxiety. 60 tablet 1  . atenolol (TENORMIN) 100 MG tablet Take 1 tablet (100 mg total) by mouth 2 (two) times daily. 60 tablet 3  . citalopram (CELEXA) 20 MG tablet TAKE 1 TABLET BY MOUTH ONCE DAILY 30 tablet 0  . losartan-hydrochlorothiazide (HYZAAR) 100-12.5 MG tablet Take 1 tablet by mouth daily. 90 tablet 1  . LYRICA 75 MG capsule TAKE 1 CAPSULE BY MOUTH TWICE DAILY 180 capsule 0  . mirtazapine (REMERON) 15 MG tablet Take 1 tablet (15 mg total) by mouth at bedtime. Patient needs to schedule a follow up appointment before more refills. 30 tablet 0  . omeprazole (PRILOSEC) 40 MG capsule Take 1 capsule (40 mg total) by mouth daily. [place]  capsule 1  . oxyCODONE-acetaminophen (ROXICET) 5-325 MG tablet Take 1 tablet by mouth every 6 (six) hours as needed for severe pain. 60 tablet 0   No current facility-administered medications for this visit.   Allergies  Allergen Reactions  . Amoxicillin-Pot Clavulanate     REACTION: nausea and abdominal pain  . Aspirin     REACTION: upset stomach  . Codeine     REACTION: nausea     Exam:  BP 126/66 mmHg  Pulse 69  Wt 160  lb (72.576 kg) Gen: Well NAD HEENT: EOMI,  MMM Lungs: Normal work of breathing. CTABL Heart: RRR no MRG Abd: NABS, Soft. Nondistended, Nontender Exts: Brisk capillary refill, warm and well perfused.  Psych: Alert and oriented normal affect speech and thought process.  No results found for this or any previous visit (from the past 24 hour(s)). No results found.   Patient was given a Tdap vaccine prior to discharge and was prescribed a shingles vaccine.  Of note there is some confusion about switching her PCP. She would like to continue Dr. Madilyn Fireman as her PCP.  Please see individual assessment and plan sections.

## 2015-05-13 NOTE — Patient Instructions (Addendum)
Thank you for coming in today. Get fasting labs soon.  Follow up with Dr Madilyn Fireman on or around your birthday for a Medicare Wellness Exam.  Consider bone density testing.  Call or go to the emergency room if you get worse, have trouble breathing, have chest pains, or palpitations.   Hypertension Hypertension, commonly called high blood pressure, is when the force of blood pumping through your arteries is too strong. Your arteries are the blood vessels that carry blood from your heart throughout your body. A blood pressure reading consists of a higher number over a lower number, such as 110/72. The higher number (systolic) is the pressure inside your arteries when your heart pumps. The lower number (diastolic) is the pressure inside your arteries when your heart relaxes. Ideally you want your blood pressure below 120/80. Hypertension forces your heart to work harder to pump blood. Your arteries may become narrow or stiff. Having untreated or uncontrolled hypertension can cause heart attack, stroke, kidney disease, and other problems. RISK FACTORS Some risk factors for high blood pressure are controllable. Others are not.  Risk factors you cannot control include:   Race. You may be at higher risk if you are African American.  Age. Risk increases with age.  Gender. Men are at higher risk than women before age 35 years. After age 33, women are at higher risk than men. Risk factors you can control include:  Not getting enough exercise or physical activity.  Being overweight.  Getting too much fat, sugar, calories, or salt in your diet.  Drinking too much alcohol. SIGNS AND SYMPTOMS Hypertension does not usually cause signs or symptoms. Extremely high blood pressure (hypertensive crisis) may cause headache, anxiety, shortness of breath, and nosebleed. DIAGNOSIS To check if you have hypertension, your health care provider will measure your blood pressure while you are seated, with your arm  held at the level of your heart. It should be measured at least twice using the same arm. Certain conditions can cause a difference in blood pressure between your right and left arms. A blood pressure reading that is higher than normal on one occasion does not mean that you need treatment. If it is not clear whether you have high blood pressure, you may be asked to return on a different day to have your blood pressure checked again. Or, you may be asked to monitor your blood pressure at home for 1 or more weeks. TREATMENT Treating high blood pressure includes making lifestyle changes and possibly taking medicine. Living a healthy lifestyle can help lower high blood pressure. You may need to change some of your habits. Lifestyle changes may include:  Following the DASH diet. This diet is high in fruits, vegetables, and whole grains. It is low in salt, red meat, and added sugars.  Keep your sodium intake below 2,300 mg per day.  Getting at least 30-45 minutes of aerobic exercise at least 4 times per week.  Losing weight if necessary.  Not smoking.  Limiting alcoholic beverages.  Learning ways to reduce stress. Your health care provider may prescribe medicine if lifestyle changes are not enough to get your blood pressure under control, and if one of the following is true:  You are 44-60 years of age and your systolic blood pressure is above 140.  You are 64 years of age or older, and your systolic blood pressure is above 150.  Your diastolic blood pressure is above 90.  You have diabetes, and your systolic blood pressure is over  [id number] or your diastolic blood pressure is over 90.  You have kidney disease and your blood pressure is above 140/90.  You have heart disease and your blood pressure is above 140/90. Your personal target blood pressure may vary depending on your medical conditions, your age, and other factors. HOME CARE INSTRUCTIONS  Have your blood pressure rechecked as directed  by your health care provider.   Take medicines only as directed by your health care provider. Follow the directions carefully. Blood pressure medicines must be taken as prescribed. The medicine does not work as well when you skip doses. Skipping doses also puts you at risk for problems.  Do not smoke.   Monitor your blood pressure at home as directed by your health care provider. SEEK MEDICAL CARE IF:   You think you are having a reaction to medicines taken.  You have recurrent headaches or feel dizzy.  You have swelling in your ankles.  You have trouble with your vision. SEEK IMMEDIATE MEDICAL CARE IF:  You develop a severe headache or confusion.  You have unusual weakness, numbness, or feel faint.  You have severe chest or abdominal pain.  You vomit repeatedly.  You have trouble breathing. MAKE SURE YOU:   Understand these instructions.  Will watch your condition.  Will get help right away if you are not doing well or get worse.   This information is not intended to replace advice given to you by your health care provider. Make sure you discuss any questions you have with your health care provider.   Document Released: 04/19/2005 Document Revised: 09/03/2014 Document Reviewed: 02/09/2013 Elsevier Interactive Patient Education [employer].

## 2015-05-13 NOTE — Addendum Note (Signed)
Addended by: Gregor Hams on: 05/13/2015 03:19 PM   Modules accepted: Orders, Medications

## 2015-05-13 NOTE — Assessment & Plan Note (Signed)
Refill medications. Follow-up with PCP.

## 2015-05-13 NOTE — Assessment & Plan Note (Signed)
Refill blood pressure medications. Check metabolic panel and lipid panel.

## 2015-05-13 NOTE — Telephone Encounter (Signed)
Pt is here for an office visit and states that she did not want to switch PCP. On 03/20/2015 the pt just wanted to see Dr. Georgina Snell for that particular office visit because she was in a lot pf pain and Dr. Madilyn Fireman did not have availability until later that week. She in no way meant that she wanted to permanently switch providers. Dr. Madilyn Fireman please advise if it is ok to switch the patient back to you as her PCP. Thank you.

## 2015-05-13 NOTE — Addendum Note (Signed)
Addended by: Gregor Hams on: 05/13/2015 02:55 PM   Modules accepted: Orders

## 2015-05-29 DIAGNOSIS — I10 Essential (primary) hypertension: Secondary | ICD-10-CM | POA: Diagnosis not present

## 2015-05-29 LAB — CBC
HCT: 42.9 % (ref 36.0–46.0)
HEMOGLOBIN: 14.4 g/dL (ref 12.0–15.0)
MCH: 32.4 pg (ref 26.0–34.0)
MCHC: 33.6 g/dL (ref 30.0–36.0)
MCV: 96.4 fL (ref 78.0–100.0)
MPV: 10.9 fL (ref 8.6–12.4)
Platelets: 208 10*3/uL (ref 150–400)
RBC: 4.45 MIL/uL (ref 3.87–5.11)
RDW: 12.6 % (ref 11.5–15.5)
WBC: 7.3 10*3/uL (ref 4.0–10.5)

## 2015-05-30 DIAGNOSIS — Z9189 Other specified personal risk factors, not elsewhere classified: Secondary | ICD-10-CM | POA: Insufficient documentation

## 2015-05-30 LAB — COMPREHENSIVE METABOLIC PANEL
ALK PHOS: 58 U/L (ref 33–130)
ALT: 22 U/L (ref 6–29)
AST: 29 U/L (ref 10–35)
Albumin: 4.2 g/dL (ref 3.6–5.1)
BUN: 15 mg/dL (ref 7–25)
CHLORIDE: 94 mmol/L — AB (ref 98–110)
CO2: 29 mmol/L (ref 20–31)
CREATININE: 0.79 mg/dL (ref 0.60–0.93)
Calcium: 9.2 mg/dL (ref 8.6–10.4)
Glucose, Bld: 86 mg/dL (ref 65–99)
POTASSIUM: 4.8 mmol/L (ref 3.5–5.3)
SODIUM: 132 mmol/L — AB (ref 135–146)
TOTAL PROTEIN: 6.6 g/dL (ref 6.1–8.1)
Total Bilirubin: 0.6 mg/dL (ref 0.2–1.2)

## 2015-05-30 LAB — LIPID PANEL
CHOLESTEROL: 209 mg/dL — AB (ref 125–200)
HDL: 89 mg/dL (ref >=46)
LDL Cholesterol: 101 mg/dL (ref <130)
TRIGLYCERIDES: 97 mg/dL (ref <150)
Total CHOL/HDL Ratio: 2.3 Ratio (ref <=5.0)
VLDL: 19 mg/dL (ref <30)

## 2015-05-30 LAB — TSH: TSH: 2.059 u[IU]/mL (ref 0.350–4.500)

## 2015-05-30 LAB — HEPATITIS C ANTIBODY: HCV AB: NEGATIVE

## 2015-05-30 MED ORDER — ATORVASTATIN CALCIUM 20 MG PO TABS: 20.0000 mg | ORAL_TABLET | Freq: Every day | ORAL | Status: DC

## 2015-05-30 NOTE — Progress Notes (Signed)
Quick Note:  Labs are all pretty normal except cholesterol which is a bit high. You 10 year risk for a hear attack or a stroke is more than 10% mostly due to age. We can cut it in half by adding lipitor. I called this medicine in to the pharmacy. Return in 1-2 months. ______

## 2015-05-30 NOTE — Addendum Note (Signed)
Addended by: Gregor Hams on: 05/30/2015 07:54 AM   Modules accepted: Orders

## 2015-06-10 ENCOUNTER — Other Ambulatory Visit: Payer: Self-pay | Admitting: Family Medicine

## 2015-06-11 MED ORDER — MIRTAZAPINE 15 MG PO TABS: 15.0000 mg | ORAL_TABLET | Freq: Every day | ORAL | Status: DC

## 2015-06-14 ENCOUNTER — Other Ambulatory Visit: Payer: Self-pay | Admitting: Family Medicine

## 2015-06-18 ENCOUNTER — Other Ambulatory Visit: Payer: Self-pay

## 2015-06-18 MED ORDER — ATENOLOL 100 MG PO TABS: 100.0000 mg | ORAL_TABLET | Freq: Two times a day (BID) | ORAL | Status: DC

## 2015-06-20 ENCOUNTER — Other Ambulatory Visit: Payer: Self-pay

## 2015-06-20 NOTE — Telephone Encounter (Signed)
Patient would like a refill on Vesicare. Historical prescription. She would like it sent to [facility].

## 2015-06-23 MED ORDER — SOLIFENACIN SUCCINATE 5 MG PO TABS: 5.0000 mg | ORAL_TABLET | Freq: Every day | ORAL | Status: DC

## 2015-06-24 ENCOUNTER — Other Ambulatory Visit: Payer: Self-pay | Admitting: Family Medicine

## 2015-07-03 ENCOUNTER — Telehealth: Payer: Self-pay

## 2015-07-03 MED ORDER — OXYCODONE-ACETAMINOPHEN 5-325 MG PO TABS: 1.0000 | ORAL_TABLET | Freq: Four times a day (QID) | ORAL | Status: DC | PRN

## 2015-07-03 NOTE — Telephone Encounter (Signed)
Chronic pain ok to refill She uses very sparingly.  Pt should come pick it up.

## 2015-07-03 NOTE — Telephone Encounter (Signed)
Patient advised.

## 2015-07-03 NOTE — Telephone Encounter (Signed)
Patient wants a refill on Oxycodone. Is this medication for long term use? If so, what is the diagnosis?

## 2015-07-08 ENCOUNTER — Ambulatory Visit (INDEPENDENT_AMBULATORY_CARE_PROVIDER_SITE_OTHER): Payer: Medicare Other | Admitting: Family Medicine

## 2015-07-08 ENCOUNTER — Other Ambulatory Visit: Payer: Self-pay | Admitting: *Deleted

## 2015-07-08 ENCOUNTER — Encounter: Payer: Self-pay | Admitting: Family Medicine

## 2015-07-08 VITALS — BP 114/59 | HR 78 | Temp 98.3°F | Wt 163.0 lb

## 2015-07-08 DIAGNOSIS — J019 Acute sinusitis, unspecified: Secondary | ICD-10-CM | POA: Insufficient documentation

## 2015-07-08 DIAGNOSIS — J018 Other acute sinusitis: Secondary | ICD-10-CM | POA: Diagnosis not present

## 2015-07-08 MED ORDER — FLUTICASONE PROPIONATE 50 MCG/ACT NA SUSP: 2.0000 | Freq: Every day | NASAL | Status: DC

## 2015-07-08 MED ORDER — HYDROCODONE-HOMATROPINE 5-1.5 MG/5ML PO SYRP: 5.0000 mL | ORAL_SOLUTION | Freq: Every evening | ORAL | Status: DC | PRN

## 2015-07-08 MED ORDER — CITALOPRAM HYDROBROMIDE 20 MG PO TABS: 20.0000 mg | ORAL_TABLET | Freq: Every day | ORAL | Status: DC

## 2015-07-08 NOTE — Progress Notes (Signed)
   Subjective:    Patient ID: Annette Ramirez, female    DOB: 06/28/1945, 70 y.o.   MRN: [id number]  HPI Patient comes in today complaining of 4 days of nasal congestion with thick amounts of postnasal drip and cough. She denies any shortness of breath or wheezing. She feels like she may have been running a low-grade temperature. Is afebrile here. She's been using some over-the-counter cough and cold medication which does help some. The cough is worse at night when she says the drainage is worse. Yesterday she had a intense headache on the right side of her head.   Review of Systems     Objective:   Physical Exam  Constitutional: She is oriented to person, place, and time. She appears well-developed and well-nourished.  HENT:  Head: Normocephalic and atraumatic.  Right Ear: External ear normal.  Left Ear: External ear normal.  Nose: Nose normal.  Mouth/Throat: Oropharynx is clear and moist.  TMs and canals are clear.   Eyes: Conjunctivae and EOM are normal. Pupils are equal, round, and reactive to light.  Neck: Neck supple. No thyromegaly present.  Cardiovascular: Normal rate, regular rhythm and normal heart sounds.   Pulmonary/Chest: Effort normal and breath sounds normal. She has no wheezes.  Lymphadenopathy:    She has no cervical adenopathy.  Neurological: She is alert and oriented to person, place, and time.  Skin: Skin is warm and dry.  Psychiatric: She has a normal mood and affect.        Assessment & Plan:  Acute sinusitis-likely viral. Recommend symptomatically care. I will start her on Flonase and give her prescription for nighttime cough medication. If she feels like she is getting worse in the next few days or if she feels like it's starting to exacerbate her COPD and please give me a call back sooner. Feel free to use the albuterol liberally and as needed.

## 2015-07-08 NOTE — Patient Instructions (Signed)

## 2015-07-09 ENCOUNTER — Telehealth: Payer: Self-pay

## 2015-07-09 MED ORDER — AZITHROMYCIN 250 MG PO TABS: ORAL_TABLET | ORAL | Status: AC

## 2015-07-09 NOTE — Telephone Encounter (Signed)
Annette Ramirez states she has more congestion and the drainage in her throat is green. She was advised to call back if she didn't get better. She is taking an OTC sinus medication without relief. No fever, chills or sweats.

## 2015-07-09 NOTE — Telephone Encounter (Signed)
Patient's husband advised 

## 2015-07-09 NOTE — Telephone Encounter (Signed)
Prescription sent for Z-Pak. I sent it to [facility] [place] hopefully that's okay.

## 2015-08-01 ENCOUNTER — Other Ambulatory Visit: Payer: Self-pay | Admitting: Family Medicine

## 2015-08-06 ENCOUNTER — Other Ambulatory Visit: Payer: Self-pay | Admitting: *Deleted

## 2015-08-06 MED ORDER — OMEPRAZOLE 40 MG PO CPDR: 40.0000 mg | DELAYED_RELEASE_CAPSULE | Freq: Every day | ORAL | Status: DC

## 2015-08-06 MED ORDER — ALPRAZOLAM 0.25 MG PO TABS: 0.2500 mg | ORAL_TABLET | Freq: Two times a day (BID) | ORAL | Status: DC | PRN

## 2015-08-08 ENCOUNTER — Other Ambulatory Visit: Payer: Self-pay | Admitting: *Deleted

## 2015-08-08 MED ORDER — ATENOLOL 100 MG PO TABS: 100.0000 mg | ORAL_TABLET | Freq: Two times a day (BID) | ORAL | Status: DC

## 2015-08-08 MED ORDER — PREGABALIN 75 MG PO CAPS: 75.0000 mg | ORAL_CAPSULE | Freq: Two times a day (BID) | ORAL | Status: DC

## 2015-08-08 MED ORDER — ATORVASTATIN CALCIUM 20 MG PO TABS: 20.0000 mg | ORAL_TABLET | Freq: Every day | ORAL | Status: DC

## 2015-08-19 ENCOUNTER — Other Ambulatory Visit: Payer: Self-pay

## 2015-08-19 MED ORDER — OXYCODONE-ACETAMINOPHEN 5-325 MG PO TABS: 1.0000 | ORAL_TABLET | Freq: Four times a day (QID) | ORAL | Status: DC | PRN

## 2015-09-01 DIAGNOSIS — R1314 Dysphagia, pharyngoesophageal phase: Secondary | ICD-10-CM | POA: Diagnosis not present

## 2015-09-01 DIAGNOSIS — K580 Irritable bowel syndrome with diarrhea: Secondary | ICD-10-CM | POA: Diagnosis not present

## 2015-09-01 DIAGNOSIS — Z86010 Personal history of colonic polyps: Secondary | ICD-10-CM | POA: Diagnosis not present

## 2015-09-01 DIAGNOSIS — K219 Gastro-esophageal reflux disease without esophagitis: Secondary | ICD-10-CM | POA: Diagnosis not present

## 2015-09-05 ENCOUNTER — Other Ambulatory Visit: Payer: Self-pay | Admitting: Family Medicine

## 2015-10-01 DIAGNOSIS — K2950 Unspecified chronic gastritis without bleeding: Secondary | ICD-10-CM | POA: Diagnosis not present

## 2015-10-01 DIAGNOSIS — K219 Gastro-esophageal reflux disease without esophagitis: Secondary | ICD-10-CM | POA: Diagnosis not present

## 2015-10-01 DIAGNOSIS — D123 Benign neoplasm of transverse colon: Secondary | ICD-10-CM | POA: Diagnosis not present

## 2015-10-01 DIAGNOSIS — D125 Benign neoplasm of sigmoid colon: Secondary | ICD-10-CM | POA: Diagnosis not present

## 2015-10-01 DIAGNOSIS — Z86010 Personal history of colonic polyps: Secondary | ICD-10-CM | POA: Diagnosis not present

## 2015-10-01 DIAGNOSIS — K319 Disease of stomach and duodenum, unspecified: Secondary | ICD-10-CM | POA: Diagnosis not present

## 2015-10-01 DIAGNOSIS — R1310 Dysphagia, unspecified: Secondary | ICD-10-CM | POA: Diagnosis not present

## 2015-10-03 DIAGNOSIS — Z86010 Personal history of colonic polyps: Secondary | ICD-10-CM | POA: Insufficient documentation

## 2015-10-03 LAB — HM COLONOSCOPY

## 2015-10-07 ENCOUNTER — Encounter: Payer: Self-pay | Admitting: Family Medicine

## 2015-10-07 ENCOUNTER — Other Ambulatory Visit: Payer: Self-pay | Admitting: Family Medicine

## 2015-10-07 MED ORDER — OXYCODONE-ACETAMINOPHEN 5-325 MG PO TABS: 1.0000 | ORAL_TABLET | Freq: Four times a day (QID) | ORAL | Status: DC | PRN

## 2015-10-10 ENCOUNTER — Other Ambulatory Visit: Payer: Self-pay | Admitting: *Deleted

## 2015-10-10 MED ORDER — ALPRAZOLAM 0.25 MG PO TABS: 0.2500 mg | ORAL_TABLET | Freq: Two times a day (BID) | ORAL | Status: DC | PRN

## 2015-10-14 ENCOUNTER — Encounter: Payer: Self-pay | Admitting: Family Medicine

## 2015-10-28 ENCOUNTER — Encounter: Payer: Self-pay | Admitting: Family Medicine

## 2015-10-28 ENCOUNTER — Ambulatory Visit (INDEPENDENT_AMBULATORY_CARE_PROVIDER_SITE_OTHER): Payer: Medicare Other | Admitting: Family Medicine

## 2015-10-28 VITALS — BP 133/74 | HR 59 | Wt 166.0 lb

## 2015-10-28 DIAGNOSIS — I10 Essential (primary) hypertension: Secondary | ICD-10-CM | POA: Diagnosis not present

## 2015-10-28 DIAGNOSIS — F411 Generalized anxiety disorder: Secondary | ICD-10-CM

## 2015-10-28 DIAGNOSIS — M797 Fibromyalgia: Secondary | ICD-10-CM | POA: Diagnosis not present

## 2015-10-28 DIAGNOSIS — M5416 Radiculopathy, lumbar region: Secondary | ICD-10-CM

## 2015-10-28 NOTE — Progress Notes (Addendum)
Subjective:    CC: Back Pain  HPI:  Follow-up fibromyalgia- she said she did try stopping the Lyrica on her own in the last, she was actually helping her so she restarted it.  Follow-up chronic back pain - she does take her oxycodone once in the morning. Occasionally she'll take an extra tab in the afternoon. But she tries to get a lot of her insurance and things done in the morning while she has more pain relief and then usually in the afternoon will just take a Tylenol.  Follow-up anxiety-overall she is doing okay. She's currently on her Celexa. Her father he was 51 has been having some major health problems and will likely be going to a nursing home tomorrow. That has actually been very stressful for her. She has felt down and depressed several days of the week. Denies any thoughts of going to harm herself. She denies any side effects from the Celexa.  Hypertension- Pt denies chest pain, SOB, dizziness, or heart palpitations.  Taking meds as directed w/o problems.  Denies medication side effects.      Past medical history, Surgical history, Family history not pertinant except as noted below, Social history, Allergies, and medications have been entered into the medical record, reviewed, and corrections made.   Review of Systems: No fevers, chills, night sweats, weight loss, chest pain, or shortness of breath.   Objective:    General: Well Developed, well nourished, and in no acute distress.  Neuro: Alert and oriented x3, extra-ocular muscles intact, sensation grossly intact.  HEENT: Normocephalic, atraumatic  Skin: Warm and dry, no rashes. Cardiac: Regular rate and rhythm, no murmurs rubs or gallops, no lower extremity edema.  Respiratory: Clear to auscultation bilaterally. Not using accessory muscles, speaking in full sentences.   Impression and Recommendations:   HTN- Well controlled. Continue current regimen. Follow up in 6 months. Due for BMP.  Fibromyalgia-continue current  regimen.   Anxiety disorder-continue current regimen. PHQ 9 score of 6 and GAD 7 score of 5 today. She rates her symptoms is somewhat difficult. Follow-up in 6 months.  Chronic back pain-her oxycodone was recently refilled.

## 2015-10-29 LAB — BASIC METABOLIC PANEL WITH GFR
BUN: 14 mg/dL (ref 7–25)
CALCIUM: 9.3 mg/dL (ref 8.6–10.4)
CHLORIDE: 94 mmol/L — AB (ref 98–110)
CO2: 27 mmol/L (ref 20–31)
Creat: 0.86 mg/dL (ref 0.60–0.93)
GFR, EST NON AFRICAN AMERICAN: 68 mL/min (ref >=60)
GFR, Est African American: 78 mL/min (ref >=60)
Glucose, Bld: 89 mg/dL (ref 65–99)
Potassium: 4.3 mmol/L (ref 3.5–5.3)
SODIUM: 136 mmol/L (ref 135–146)

## 2015-10-29 NOTE — Progress Notes (Signed)
Quick Note:  All labs are normal. ______ 

## 2015-11-06 ENCOUNTER — Other Ambulatory Visit: Payer: Self-pay | Admitting: Family Medicine

## 2015-11-25 ENCOUNTER — Other Ambulatory Visit: Payer: Self-pay

## 2015-11-25 MED ORDER — OXYCODONE-ACETAMINOPHEN 5-325 MG PO TABS: 1.0000 | ORAL_TABLET | Freq: Four times a day (QID) | ORAL | 0 refills | Status: DC | PRN

## 2015-12-16 DIAGNOSIS — H25813 Combined forms of age-related cataract, bilateral: Secondary | ICD-10-CM | POA: Diagnosis not present

## 2015-12-16 DIAGNOSIS — H5203 Hypermetropia, bilateral: Secondary | ICD-10-CM | POA: Diagnosis not present

## 2016-01-06 ENCOUNTER — Other Ambulatory Visit: Payer: Self-pay | Admitting: Family Medicine

## 2016-01-06 MED ORDER — OXYCODONE-ACETAMINOPHEN 5-325 MG PO TABS: 1.0000 | ORAL_TABLET | Freq: Four times a day (QID) | ORAL | 0 refills | Status: DC | PRN

## 2016-01-12 ENCOUNTER — Other Ambulatory Visit: Payer: Self-pay | Admitting: Family Medicine

## 2016-01-13 ENCOUNTER — Other Ambulatory Visit: Payer: Self-pay | Admitting: *Deleted

## 2016-01-13 MED ORDER — ALPRAZOLAM 0.25 MG PO TABS: 0.2500 mg | ORAL_TABLET | Freq: Two times a day (BID) | ORAL | 1 refills | Status: DC | PRN

## 2016-02-11 ENCOUNTER — Telehealth: Payer: Self-pay

## 2016-02-11 NOTE — Telephone Encounter (Signed)
Please try to get more information. Is she specifically concerned about sleep apnea? Or issue is she more worried about dropping her oxygen while sleeping? Have we ever done a stop bang questionnaire on her?. Has the one ever told her they thought she might have sleep apnea?

## 2016-02-11 NOTE — Telephone Encounter (Signed)
Pt would like to have a sleep study done. Please advise.

## 2016-02-12 NOTE — Telephone Encounter (Signed)
She feels tired when waking up in the morning after sleeping for 8 hrs.  She stated that her husband noticed her snoring also he said she went periods of times without breathing.  Please advise.

## 2016-02-12 NOTE — Telephone Encounter (Signed)
Pt notified and transferred to the front desk  

## 2016-02-12 NOTE — Telephone Encounter (Signed)
She will need to come in for an office visit. It has to be documented why we are ordering the test with an office visit before we order it.

## 2016-02-19 ENCOUNTER — Encounter: Payer: Self-pay | Admitting: Family Medicine

## 2016-02-19 ENCOUNTER — Ambulatory Visit (INDEPENDENT_AMBULATORY_CARE_PROVIDER_SITE_OTHER): Payer: Medicare Other | Admitting: Family Medicine

## 2016-02-19 VITALS — BP 154/83 | HR 68 | Ht 62.0 in | Wt 162.0 lb

## 2016-02-19 DIAGNOSIS — F331 Major depressive disorder, recurrent, moderate: Secondary | ICD-10-CM

## 2016-02-19 DIAGNOSIS — R0683 Snoring: Secondary | ICD-10-CM | POA: Diagnosis not present

## 2016-02-19 DIAGNOSIS — M797 Fibromyalgia: Secondary | ICD-10-CM

## 2016-02-19 DIAGNOSIS — R400 Somnolence: Secondary | ICD-10-CM | POA: Diagnosis not present

## 2016-02-19 DIAGNOSIS — Z23 Encounter for immunization: Secondary | ICD-10-CM

## 2016-02-19 DIAGNOSIS — F411 Generalized anxiety disorder: Secondary | ICD-10-CM | POA: Diagnosis not present

## 2016-02-19 MED ORDER — OXYCODONE-ACETAMINOPHEN 5-325 MG PO TABS: 1.0000 | ORAL_TABLET | Freq: Four times a day (QID) | ORAL | 0 refills | Status: DC | PRN

## 2016-02-19 NOTE — Progress Notes (Signed)
Subjective:    CC: sleep apnea?   HPI: 70 yo female Is a history of COPD comes in today complaining of loud snoring. Her spouse has says she quit breathing. She grinds her teeth. She has high BP. She has extreme fatigue through out the day as well.  Wakes up with aching.  Father had a stroke in the spring and has been under more stress.  She is on xanax, remeron and oxycodone. She does have a brother with the Sleep apnea.  She has had chronic insomnia for quite some time. She says most nights she actually only sleeps a few hours even after taking half a Xanax and her Remeron and and oxycodone.  Anxiety/depression-she still grieving over the loss of her father and is feeling a little stressed by this. She is currently on citalopram 20 mg daily.  Past medical history, Surgical history, Family history not pertinant except as noted below, Social history, Allergies, and medications have been entered into the medical record, reviewed, and corrections made.   Review of Systems: No fevers, chills, night sweats, weight loss, chest pain, or shortness of breath.   Objective:    General: Well Developed, well nourished, and in no acute distress.  Neuro: Alert and oriented x3, extra-ocular muscles intact, sensation grossly intact.  HEENT: Normocephalic, atraumatic  Skin: Warm and dry, no rashes. Cardiac: Regular rate and rhythm, no murmurs rubs or gallops, no lower extremity edema.  Respiratory: Clear to auscultation bilaterally. Not using accessory muscles, speaking in full sentences.   Impression and Recommendations:   Snoring - will refer for sleep study.  OSA Risk score of 6/9  = High Risk. D[facility] with [facility]. Not a good candidate for home sleep study..  Fibromyalgia - did refill her oxycodone today. Discussed weaning her off of alprazolam. Discussed that there is a contraindication between the 2 drugs that there is an increased risk of death with  the combination. She is willing to wean her alprazolam. She currently takes half a tab twice a day so we'll have her stop her evening dose and continue with half a tab twice a day in the morning only for 2 weeks, and then every other day for 1 week and then stop completely.  Depression and Anxiety - dad 7 score of 6 and PHQ 9 score of 10 today. The work on seeing if she may have sleep apnea first. Otherwise I'll see her back in 6 weeks. We may need to consider switching her citalopram to something else. She may do well with Cymbalta.

## 2016-02-19 NOTE — Patient Instructions (Signed)
Decrease xanax to 1/2 tab in AM only for 2 weeks, then 1/2 tab in AM every other day for 1 week and then stop.

## 2016-02-26 LAB — PAIN MGMT, PROFILE 6 CONF W/O MM, U
6 ACETYLMORPHINE: NEGATIVE ng/mL (ref <10)
ALCOHOL METABOLITES: POSITIVE ng/mL — AB (ref <500)
ALPHAHYDROXYTRIAZOLAM: NEGATIVE ng/mL (ref <50)
AMINOCLONAZEPAM: NEGATIVE ng/mL (ref <25)
Alphahydroxyalprazolam: 100 ng/mL — ABNORMAL HIGH (ref <25)
Alphahydroxymidazolam: NEGATIVE ng/mL (ref <50)
Amphetamines: NEGATIVE ng/mL (ref <500)
Barbiturates: NEGATIVE ng/mL (ref <300)
Benzodiazepines: POSITIVE ng/mL — AB (ref <100)
COCAINE METABOLITE: NEGATIVE ng/mL (ref <150)
CODEINE: NEGATIVE ng/mL (ref <50)
Creatinine: 139 mg/dL (ref >=20.0)
ETHYL GLUCURONIDE (ETG): 280023 ng/mL — AB (ref <500)
ETHYL SULFATE (ETS): 35820 ng/mL — AB (ref <100)
HYDROMORPHONE: NEGATIVE ng/mL (ref <50)
Hydrocodone: NEGATIVE ng/mL (ref <50)
Hydroxyethylflurazepam: NEGATIVE ng/mL (ref <50)
LORAZEPAM: NEGATIVE ng/mL (ref <50)
MORPHINE: NEGATIVE ng/mL (ref <50)
Marijuana Metabolite: NEGATIVE ng/mL (ref <20)
Methadone Metabolite: NEGATIVE ng/mL (ref <100)
NORDIAZEPAM: 66 ng/mL — AB (ref <50)
NORHYDROCODONE: NEGATIVE ng/mL (ref <50)
Noroxycodone: 1726 ng/mL — ABNORMAL HIGH (ref <50)
OXAZEPAM: 63 ng/mL — AB (ref <50)
OXIDANT: NEGATIVE ug/mL (ref <200)
Opiates: NEGATIVE ng/mL (ref <100)
Oxycodone: 1450 ng/mL — ABNORMAL HIGH (ref <50)
Oxycodone: POSITIVE ng/mL — AB (ref <100)
Oxymorphone: 679 ng/mL — ABNORMAL HIGH (ref <50)
PLEASE NOTE: 0
Phencyclidine: NEGATIVE ng/mL (ref <25)
Temazepam: 226 ng/mL — ABNORMAL HIGH (ref <50)
pH: 5.25 (ref 4.5–9.0)

## 2016-03-01 ENCOUNTER — Other Ambulatory Visit: Payer: Self-pay | Admitting: Family Medicine

## 2016-03-15 ENCOUNTER — Encounter: Payer: Self-pay | Admitting: Family Medicine

## 2016-03-15 ENCOUNTER — Ambulatory Visit (INDEPENDENT_AMBULATORY_CARE_PROVIDER_SITE_OTHER): Payer: Medicare Other | Admitting: Family Medicine

## 2016-03-15 ENCOUNTER — Ambulatory Visit (INDEPENDENT_AMBULATORY_CARE_PROVIDER_SITE_OTHER): Payer: Medicare Other

## 2016-03-15 DIAGNOSIS — M4316 Spondylolisthesis, lumbar region: Secondary | ICD-10-CM

## 2016-03-15 DIAGNOSIS — M545 Low back pain, unspecified: Secondary | ICD-10-CM

## 2016-03-15 DIAGNOSIS — G8929 Other chronic pain: Secondary | ICD-10-CM

## 2016-03-15 NOTE — Patient Instructions (Signed)
Thank you for coming in today. Attend PT.  Recheck in 6 weeks or sooner if worse.   Spondylolisthesis With Rehab The slipping of one or multiple vertebrae out of the correct anatomical position is a condition known as spondylolisthesis. Spondylolisthesis is most common in adolescents and is caused by a number of different reasons, such as vertebral fracture or something you are born with (congenital). Spondylolisthesis is diagnosed with the use of X-rays. SYMPTOMS   Dull, achy pain in the lower back.  Pain that worsens with extension of the spine.  Tightness of the muscles on the back of the thigh.  Lower back stiffness.  Signs of nerve damage: pain, numbness, or weakness affecting one or both lower extremities.  Muscle wasting (atrophy), uncommon.  Loss of stool (bowel) or urine (bladder) function. CAUSES  The symptoms of spondylolisthesis are caused by one or more vertebrae that are out of alignment, placing pressure on the spinal cord. Common mechanisms of injury include:  Congenital defect of the spine.  Degenerative process.  Stress fracture of the spine.  Fracture due to trauma to the spine. RISK INCREASES WITH:  Activities that have a risk of hyperextending the back.  Activities that have a risk of excessively rotating the spine.  Poor strength and flexibility.  Failure to warm up properly before activity.  Family history of spondylolysis or spondylolisthesis.  Improper sports technique. PREVENTION  Warm up and stretch properly before activity.  Allow for adequate recovery between workouts.  Maintain physical fitness:  Strength, flexibility, and endurance.  Cardiovascular fitness.  Learn and use proper technique. When possible, have a coach correct improper technique. PROGNOSIS  If treated properly, the spondylolisthesis usually resolves. RELATED COMPLICATIONS   Recurrent symptoms that result in a chronic problem.  Inability to compete in  athletics.  Prolonged healing time, if improperly treated or reinjured.  Failure of the fracture to heal (nonunion).  Healing of the fracture in a poor position (malunion). TREATMENT Treatment initially involves resting from any activities that aggravate the symptoms and the use of ice and medications to help reduce pain and inflammation. The use of strengthening and stretching exercises may help reduce pain with activity. These exercises may be performed at home or with referral to a therapist. It is important to learn how to use proper body mechanics as to not place undue stress on your spine. If the injury is severe, then your caregiver may recommend a back brace to allow for healing, or even surgery. Surgery often involves fusing two adjacent vertebrae so no movement is allowed between them.  MEDICATION   If pain medication is necessary, then nonsteroidal anti-inflammatory medications, such as aspirin and ibuprofen, or other minor pain relievers, such as acetaminophen, are often recommended.  Do not take pain medication for 7 days before surgery.  Prescription pain relievers may be given if deemed necessary by your caregiver. Use only as directed and only as much as you need. HEAT AND COLD  Cold treatment (icing) relieves pain and reduces inflammation. Cold treatment should be applied for 10 to 15 minutes every 2 to 3 hours for inflammation and pain and immediately after any activity that aggravates your symptoms. Use ice packs or massage the area with a piece of ice (ice massage).  Heat treatment may be used prior to performing the stretching and strengthening activities prescribed by your caregiver, physical therapist, or athletic trainer. Use a heat pack or soak the injury in warm water. SEEK MEDICAL CARE IF:  Treatment seems to  offer no benefit, or the condition worsens.  Any medications produce adverse side effects.  Any complications from surgery occur:  Pain, numbness, or  coldness in the extremity operated upon.  Discoloration of the nail beds (they become blue or gray) of the extremity operated upon.  Signs of infections (fever, pain, inflammation, redness, or persistent bleeding). EXERCISES RANGE OF MOTION (ROM) AND STRETCHING EXERCISES - Spondylolisthesis Most people with low back pain will find that their symptoms worsen with either excessive bending forward (flexion) or arching at the low back (extension). The exercises which will help resolve your symptoms will focus on the opposite motion. Your physician, physical therapist or athletic trainer will help you determine which exercises will be most helpful to resolve your low back pain. Do not complete any exercises without first consulting with your clinician. Discontinue any exercises which worsen your symptoms until you speak to your clinician. If you have pain, numbness or tingling which travels down into your buttocks, leg, or foot, the goal of the therapy is for these symptoms to move closer to your back and eventually resolve. Occasionally, these leg symptoms will get better, but your low back pain may worsen; this is typically an indication of progress in your rehabilitation. Be certain to be very alert to any changes in your symptoms and the activities in which you participated in the 24 hours prior to the change. Sharing this information with your clinician will allow him/her to most efficiently treat your condition. These exercises may help you when beginning to rehabilitate your injury. Your symptoms may resolve with or without further involvement from your physician, physical therapist or athletic trainer. While completing these exercises, remember:   Restoring tissue flexibility helps normal motion to return to the joints. This allows healthier, less painful movement and activity.  An effective stretch should be held for at least 30 seconds.  A stretch should never be painful. You should only feel a  gentle lengthening or release in the stretched tissue. FLEXION RANGE OF MOTION AND STRETCHING EXERCISES: STRETCH - Flexion, Single Knee to Chest  Lie on a firm bed or floor with both legs extended in front of you.  Keeping one leg in contact with the floor, bring your opposite knee to your chest. Hold your leg in place by either grabbing behind your thigh or at your knee.  Pull until you feel a gentle stretch in your low back. Hold __________ seconds. Slowly release your grasp and repeat the exercise with the opposite side. Repeat __________ times. Complete this exercise __________ times per day.  STRETCH - Flexion, Double Knee to Chest  Lie on a firm bed or floor with both legs extended in front of you.  Keeping one leg in contact with the floor, bring your opposite knee to your chest.  Tense your stomach muscles to support your back and then lift your other knee to your chest. Hold your legs in place by either grabbing behind your thighs or at your knees.  Pull both knees toward your chest until you feel a gentle stretch in your low back. Hold __________ seconds.  Tense your stomach muscles and slowly return one leg at a time to the floor. Repeat __________ times. Complete this exercise __________ times per day.  STRENGTHENING EXERCISES - Spondylolisthesis These exercises may help you when beginning to rehabilitate your injury. These exercises should be done near your "sweet spot." This is the neutral, low-back arch, somewhere between fully rounded and fully arched, that is your least  painful position. When performed in this safe range of motion, these exercises can be used for people who have either a flexion or extension based injury. These exercises may resolve your symptoms with or without further involvement from your physician, physical therapist or athletic trainer. While completing these exercises, remember:   Muscles can gain both the endurance and the strength needed for  everyday activities through controlled exercises.  Complete these exercises as instructed by your physician, physical therapist or athletic trainer. Progress the resistance and repetitions only as guided.  You may experience muscle soreness or fatigue, but the pain or discomfort you are trying to eliminate should never worsen during these exercises. If this pain does worsen, stop and make certain you are following the directions exactly. If the pain is still present after adjustments, discontinue the exercise until you can discuss the trouble with your clinician. STRENGTHENING - Deep Abdominals, Pelvic Tilt   Lie on a firm bed or floor. Keeping your legs in front of you, bend your knees so they are both pointed toward the ceiling and your feet are flat on the floor.  Tense your lower abdominal muscles to press your low back into the floor. This motion will rotate your pelvis so that your tail bone is scooping upwards rather than pointing at your feet or into the floor.  With a gentle tension and even breathing, hold this position for __________ seconds. Repeat __________ times. Complete this exercise __________ times per day.  STRENGTHENING - Abdominals, Crunches   Lie on a firm bed or floor. Keeping your legs in front of you, bend your knees so they are both pointed toward the ceiling and your feet are flat on the floor. Cross your arms over your chest.  Slightly tip your chin down without bending your neck.  Tense your abdominals and slowly lift your trunk high enough to just clear your shoulder blades. Lifting higher can put excessive stress on the low back and does not further strengthen your abdominal muscles.  Control your return to the starting position. Repeat __________ times. Complete this exercise __________ times per day.  STRENGTHENING - Quadruped, Opposite UE/LE Lift   Assume a hands and knees position on a firm surface. Keep your hands under your shoulders and your knees under  your hips. You may place padding under your knees for comfort.  Find your neutral spine and gently tense your abdominal muscles so that you can maintain this position. Your shoulders and hips should form a rectangle that is parallel with the floor and is not twisted.  Keeping your trunk steady, lift your right hand no higher than your shoulder and then your left leg no higher than your hip. Make sure you are not holding your breath. Hold this position __________ seconds.  Continuing to keep your abdominal muscles tense and your back steady, slowly return to your starting position. Repeat with the opposite arm and leg. Repeat __________ times. Complete this exercise __________ times per day.  STRENGTHENING - Lower Abdominals, Double Knee Lift  Lie on a firm bed or floor. Keeping your legs in front of you, bend your knees so they are both pointed toward the ceiling and your feet are flat on the floor.  Tense your abdominal muscles to brace your low back and slowly lift both of your knees until they come over your hips. Be certain not to hold your breath.  Hold __________ seconds. Using your abdominal muscles, return to the starting position in a slow and  controlled manner. Repeat __________ times. Complete this exercise __________ times per day.  POSTURE AND BODY MECHANICS CONSIDERATIONS - Spondylolisthesis Keeping correct posture when sitting, standing or completing your activities will reduce the stress put on different body tissues, allowing injured tissues a chance to heal and limiting painful experiences. The following are general guidelines for improved posture. Your physician or physical therapist will provide you with any instructions specific to your needs. While reading these guidelines, remember:  The exercises prescribed by your provider will help you have the flexibility and strength to maintain correct postures.  The correct posture provides the optimal environment for your joints to  work. All of your joints have less wear and tear when properly supported by a spine with good posture. This means you will experience a healthier, less painful body.  Correct posture must be practiced with all of your activities, especially prolonged sitting and standing. Correct posture is as important when doing repetitive low-stress activities (typing) as it is when doing a single heavy-load activity (lifting). PROPER SITTING POSTURE In order to minimize stress and discomfort on your spine, you must sit with correct posture. Sitting with good posture should be effortless for a healthy body. Returning to good posture is a gradual process. Many people can work toward this most comfortably by using various supports until they have the flexibility and strength to maintain this posture on their own. When sitting with proper posture, your ears will fall over your shoulders and your shoulders will fall over your hips. You should use the back of the chair to support your upper back. Your low back will be in a neutral position, just slightly arched. You may place a small pillow or folded towel at the base of your low back for  support.  When working at a desk, create an environment that supports good, upright posture. Without extra support, muscles fatigue and lead to excessive strain on joints and other tissues. Keep these recommendations in mind: CHAIR:  A chair should be able to slide under your desk when your back makes contact with the back of the chair. This allows you to work closely.  The chair's height should allow your eyes to be level with the upper part of your monitor and your hands to be slightly lower than your elbows. BODY POSITION  Your feet should make contact with the floor. If this is not possible, use a foot rest.  Keep your ears over your shoulders. This will reduce stress on your neck and low back. INCORRECT SITTING POSTURES If you are feeling tired and unable to assume a healthy  sitting posture, do not slouch or slump. This puts excessive strain on your back tissues, causing more damage and pain. Healthier options include:  Using more support, like a lumbar pillow.  Switching tasks to something that requires you to be upright or walking.  Taking a brief walk.  Lying down to rest in a neutral-spine position.  CORRECT LIFTING TECHNIQUES DO:   Assume a wide stance. This will provide you more stability and the opportunity to get as close as possible to the object which you are lifting.  Tense your abdominals to brace your spine; then bend at the knees and hips. Keeping your back locked in a neutral-spine position, lift using your leg muscles. Lift with your legs, keeping your back straight.  Test the weight of unknown objects before attempting to lift them.  Try to keep your elbows locked down at your sides in order get  the best strength from your shoulders when carrying an object.  Always ask for help when lifting heavy or awkward objects. INCORRECT LIFTING TECHNIQUES DO NOT:   Lock your knees when lifting, even if it is a small object.  Bend and twist. Pivot at your feet or move your feet when needing to change directions.  Assume that you cannot safely pick up a paperclip without proper posture.   This information is not intended to replace advice given to you by your health care provider. Make sure you discuss any questions you have with your health care provider.   Document Released: 04/19/2005 Document Revised: 01/08/2015 Document Reviewed: 08/01/2008 Elsevier Interactive Patient Education [employer].

## 2016-03-15 NOTE — Progress Notes (Signed)
   Subjective:    I'm seeing this patient as a consultation for:  METHENEY,CATHERINE, MD   CC: Low back pain  HPI: Patient notes a six-month history of chronic low back pain right worse than left. She denies any radiating pain or numbness. She does note some mild subjective leg weakness she thinks is due to fatigue. She also notes that she is a bit unstable on her feet. She denies any injury. The pain is moderate to severe at times and does limit her ability to exercise. She controls her pain with chronic oxycodone which does help. She is a pertinent past surgical history for discectomy and laminectomy at L4-L5 due to radicular pain in 2014.  Past medical history, Surgical history, Family history not pertinant except as noted below, Social history, Allergies, and medications have been entered into the medical record, reviewed, and no changes needed.   Review of Systems: No headache, visual changes, nausea, vomiting, diarrhea, constipation, dizziness, abdominal pain, skin rash, fevers, chills, night sweats, weight loss, swollen lymph nodes, body aches, joint swelling, muscle aches, chest pain, shortness of breath, mood changes, visual or auditory hallucinations.   Objective:    Vitals:   03/15/16 0956  BP: (!) 146/72  Pulse: 62   General: Well Developed, well nourished, and in no acute distress.  Neuro/Psych: Alert and oriented x3, extra-ocular muscles intact, able to move all 4 extremities, sensation grossly intact. Skin: Warm and dry, no rashes noted.  Respiratory: Not using accessory muscles, speaking in full sentences, trachea midline.  Cardiovascular: Pulses palpable, no extremity edema. Abdomen: Does not appear distended. MSK: L-spine: Normal-appearing. Tender to palpation right SI joint area. Lumbar motion is normal but patient expresses pain with left lumbar rotation and left lateral flexion. Lower extremity strength is equal and normal throughout. Reflexes are intact bilateral  legs. Antalgic gait. Sensation is intact throughout.  X-ray L-spine: Diffuse DDD with grade 1 spondylolisthesis at L4-L5 Awaiting formal radiology review.  No results found for this or any previous visit (from the past 24 hour(s)). No results found.  Impression and Recommendations:    Assessment and Plan: 70 y.o. female with  Chronic low back pain likely due to facet DJD versus spondylolisthesis. Plan for trial of physical therapy. Recheck in 6 weeks or sooner if needed. If not better proceed with MRI.   Discussed warning signs or symptoms. Please see discharge instructions. Patient expresses understanding.

## 2016-03-17 ENCOUNTER — Encounter: Payer: Self-pay | Admitting: Physical Therapy

## 2016-03-17 ENCOUNTER — Ambulatory Visit (INDEPENDENT_AMBULATORY_CARE_PROVIDER_SITE_OTHER): Payer: Medicare Other | Admitting: Physical Therapy

## 2016-03-17 DIAGNOSIS — G8929 Other chronic pain: Secondary | ICD-10-CM | POA: Diagnosis not present

## 2016-03-17 DIAGNOSIS — M545 Low back pain: Secondary | ICD-10-CM | POA: Diagnosis not present

## 2016-03-17 DIAGNOSIS — M6281 Muscle weakness (generalized): Secondary | ICD-10-CM | POA: Diagnosis not present

## 2016-03-17 NOTE — Patient Instructions (Signed)
Knee to Chest (Flexion)   Pull knee toward chest. Feel stretch in lower back or buttock area. Breathing deeply, Hold __30__ seconds. Repeat with other knee. Repeat __2__ times. Do __1-2__ sessions per day.  Isometric Abdominal   Lying on back with knees bent, tighten stomach by pressing elbows down. Hold _5___ seconds. Repeat __10__ times per set. Do ____1 sets per session. Do _1-2___ sessions per day.  Lumbar Rotation: Caudal - Bilateral (Supine)   Feet and knees together, arms outstretched, rock knees left and right, staying within shoulder distance ( man in picture is going too far) ,relaxing muscles of low back. Perform for 1 minute. Relax. Repeat _3___ times per set.  Do __1-2__ sessions per day.  Hip Flexion / Knee Extension: Straight-Leg Raise (Eccentric)   Lie on back. Lift leg with knee straight. Slowly lower leg  __10_ reps per set, __2_ sets per day  ABDUCTION: Side-Lying (Active)   Lie on left side, top leg straight. Raise top leg as far as possible.  Complete __2_ sets of __10_ repetitions. Perform _1-2__ sessions per day.  [email or web address]   (Home) Extension: Hip   With support under abdomen, tighten stomach. Lift right leg in line with body. Do not hyperextend. Alternate legs. Repeat ____ times per set. Do ____ sets per session. Do ____ sessions per week.   [email or web address]   Copyright  VHI. All rights reserved.    Copyright  VHI. All rights reserved.

## 2016-03-17 NOTE — Therapy (Signed)
[place], [place], [address] Phone: [phone]   Fax:  [phone]  Physical Therapy Evaluation  Patient Details  Name: Annette Ramirez MRN: [id number] Date of Birth: [birth year] Referring Provider: Lynne Leader  Encounter Date: 03/17/2016      PT End of Session - 03/17/16 1051    Visit Number 1   Date for PT Re-Evaluation 05/12/16   PT Start Time [id number]   PT Stop Time 1105   PT Time Calculation (min) 50 min   Activity Tolerance Patient tolerated treatment well   Behavior During Therapy [facility] for tasks assessed/performed      Past Medical History:  Diagnosis Date  . Anxiety   . Asthma   . COPD (chronic obstructive pulmonary disease) ([place])   . Depression   . Exertional shortness of breath   . Family history of anesthesia complication    "granddaughter has PONV" (02/28/2013)  . Fibromyalgia   . GERD (gastroesophageal reflux disease)   . H/O hiatal hernia   . Hypertension   . Migraines    "in the past; they've stopped" (02/28/2013)  . Osteoporosis   . PONV (postoperative nausea and vomiting)   . Sinus headache     Past Surgical History:  Procedure Laterality Date  . ABDOMINAL HYSTERECTOMY  1975  . APPENDECTOMY  1975  . BREAST CYST ASPIRATION Left 1980's  . COLONOSCOPY W/ BIOPSIES AND POLYPECTOMY  2012  . DILATION AND CURETTAGE OF UTERUS  1960's  . EXCISIONAL HEMORRHOIDECTOMY  1980's  . LUMBAR LAMINECTOMY  02/28/2013  . LUMBAR LAMINECTOMY/DECOMPRESSION MICRODISCECTOMY N/A 02/28/2013   Procedure: LUMBAR LAMINECTOMY/DECOMPRESSION MICRODISCECTOMY/Lumbar 4-5 decompression;  Surgeon: Sinclair Ship, MD;  Location: [facility];  Service: Orthopedics;  Laterality: N/A;  Lumbar 4-5 decompression  . TONSILLECTOMY  1950  . TUBAL LIGATION  1972    There were no vitals filed for this visit.       Subjective Assessment - 03/17/16 1017    Subjective Pt with onset of back pain 5-6 months  ago, worsening as time has gone on. Pt with surgery to remove cyst in 2014 and MD thinks this caused pt to develop spondylolisthesis.  Pt has had increasing difficulty with daily activities   Pertinent History cyst removal 2014   Limitations Standing   How long can you stand comfortably? 10 minutes   Patient Stated Goals return to daily activities without pain   Currently in Pain? Yes   Pain Score 4    Pain Location Back   Pain Orientation Lower;Right   Pain Descriptors / Indicators --  Stiff   Pain Type Chronic pain   Pain Onset More than a month ago   Pain Frequency Intermittent   Aggravating Factors  stand, walk   Pain Relieving Factors nothing            [facility] PT Assessment - 03/17/16 0001      Assessment   Medical Diagnosis Low back pain without sciatica   Referring Provider corey, evan     Precautions   Precautions None     Restrictions   Weight Bearing Restrictions No     Balance Screen   Has the patient fallen in the past 6 months No   Has the patient had a decrease in activity level because of a fear of falling?  Yes   Is the patient reluctant to leave their home because of a fear of falling?  No     Observation/Other Assessments  Focus on Therapeutic Outcomes (FOTO)  56% limited (CK)     Posture/Postural Control   Posture Comments moderate kyphosis     ROM / Strength   AROM / PROM / Strength AROM;Strength     AROM   Overall AROM Comments hip mobility bilat limited in IR, other motions [facility]   AROM Assessment Site Lumbar   Lumbar Flexion limited 25%   Lumbar Extension WFL  pain at end range   Lumbar - Right Side Bend limited 25%  pain at end range   Lumbar - Left Side Bend limited 25%   Lumbar - Right Rotation WFL  pain at end range   Lumbar - Left Rotation WFL  pain at end range     Strength   Strength Assessment Site Hip   Right/Left Hip Right;Left   Right Hip Flexion 3/5   Right Hip Extension 3/5   Right Hip ABduction 3-/5   Left Hip  Flexion 3/5   Left Hip Extension 3/5   Left Hip ABduction 3-/5     Palpation   Spinal mobility decreased jt mobility L4-S1   SI assessment  decreased jt mobility with PAs   Palpation comment ttp with CPAs L4-S1     Special Tests    Special Tests --  SLR (-), FABER (-)                   [facility] Adult PT Treatment/Exercise - 03/17/16 0001      Exercises   Exercises Lumbar     Lumbar Exercises: Stretches   Single Knee to Chest Stretch 1 rep;30 seconds   Lower Trunk Rotation 60 seconds     Modalities   Modalities Electrical Stimulation;Moist Heat     Moist Heat Therapy   Number Minutes Moist Heat 15 Minutes   Moist Heat Location Lumbar Spine     Electrical Stimulation   Electrical Stimulation Location low back   Electrical Stimulation Action IFC   Electrical Stimulation Parameters to tolerance   Electrical Stimulation Goals Pain                PT Education - 03/17/16 1043    Education provided Yes   Education Details PT POC, HEP   Person(s) Educated Patient   Methods Explanation;Demonstration;Handout   Comprehension Returned demonstration;Verbalized understanding          PT Short Term Goals - 03/17/16 1055      PT SHORT TERM GOAL #1   Title Pt will be independent in initial HEP   Time 4   Period Weeks   Status New     PT SHORT TERM GOAL #2   Title Pt will tolerate standing x 10 minutes with pain < 4/10   Time 4   Period Weeks   Status New     PT SHORT TERM GOAL #3   Title Pt will improve bilat LE strength to 3+/5 to improve standing endurance   Time 4   Period Weeks   Status New           PT Long Term Goals - 03/17/16 1056      PT LONG TERM GOAL #1   Title Pt will be independent in advanced HEP   Time 8   Period Weeks   Status New     PT LONG TERM GOAL #2   Title Pt will improve FOTO to <=46% to demo improved mobility   Time 8   Period Weeks   Status New  PT LONG TERM GOAL #3   Title Pt will improve bilat LE  strength to 4+/5 to increase activity tolerance   Time 8   Period Weeks   Status New     PT LONG TERM GOAL #4   Title Pt will stand in kitchen x 20 minutes with no increase in symptoms   Time 8   Period Weeks   Status New               Plan - 03/17/16 1052    Clinical Impression Statement Pt presents with pain and tenderness in lumbar spine, decreased LE strength and decreased activity tolerance and flexibility and will benefit from skilled PT to address deficits to improve to prior level of funcitonal mobility   Rehab Potential Good   PT Frequency 2x / week   PT Duration 8 weeks   PT Treatment/Interventions Cryotherapy;Moist Heat;Iontophoresis 4mg /ml Dexamethasone;Electrical Stimulation;Functional mobility training;Therapeutic activities;Patient/family education;Manual techniques;Dry needling;Passive range of motion;Taping;Therapeutic exercise;Balance training;DME Instruction;Neuromuscular re-education   PT Next Visit Plan assess HEP, progress manual and modalities as tolerated   PT Home Exercise Plan lumbar and hip strengthening   Consulted and Agree with Plan of Care Patient      Patient will benefit from skilled therapeutic intervention in order to improve the following deficits and impairments:  Decreased endurance, Decreased activity tolerance, Decreased strength, Hypomobility, Decreased range of motion, Pain  Visit Diagnosis: Chronic midline low back pain without sciatica - Plan: PT plan of care cert/re-cert  Muscle weakness (generalized) - Plan: PT plan of care cert/re-cert     Problem List Patient Active Problem List   Diagnosis Date Noted  . Lumbago 03/15/2016  . Risk for coronary artery disease between 10% and 20% in next 10 years 05/30/2015  . Arteriosclerosis of aorta ([place]) 03/17/2015  . Thoracic neuritis 07/25/2012  . COPD exacerbation ([place]) 07/02/2010  . Depression 02/13/2009  . HEARING LOSS 09/25/2008  . GERD 10/10/2007  . INSOMNIA 03/21/2007  .  ANXIETY DISORDER, GENERALIZED 03/10/2006  . TOBACCO ABUSE 03/10/2006  . HYPERTENSION, BENIGN ESSENTIAL 03/10/2006  . Fibromyalgia 03/10/2006  . Osteoporosis 03/10/2006  . Polymyositis ([place]) 03/10/2006    Isabelle Course, PT, DPT 03/17/2016, 11:00 AM  [facility] [place], [place], [address] Phone: [phone]   Fax:  [phone]  Name: Annette Ramirez MRN: [id number] Date of Birth: 1946-03-18

## 2016-03-23 ENCOUNTER — Ambulatory Visit (INDEPENDENT_AMBULATORY_CARE_PROVIDER_SITE_OTHER): Payer: Medicare Other | Admitting: Physical Therapy

## 2016-03-23 DIAGNOSIS — M545 Low back pain: Secondary | ICD-10-CM

## 2016-03-23 DIAGNOSIS — M6281 Muscle weakness (generalized): Secondary | ICD-10-CM

## 2016-03-23 DIAGNOSIS — G8929 Other chronic pain: Secondary | ICD-10-CM | POA: Diagnosis not present

## 2016-03-23 NOTE — Patient Instructions (Addendum)
Leg Lengthener / Leg Press Combo: Single Leg    Straighten one leg down to floor. Pull toes AND forefoot toward knee; extend heel. Lengthen leg by pulling pelvis away from ribs. Press leg down. DO NOT BEND KNEE. Hold _3-5__ seconds. Relax leg. Repeat exercise 5 time. Relax leg. Re-bend knee. Repeat with other leg.  Copyright  VHI. All rights reserved.  Arm Lengthener: Single    Arms at sides, palms down. Lift one arm over head to alongside ear, keeping elbow straight. Lengthen arm by pulling ribs away from pelvis. Hold __3-5_ seconds. Relax.    Repeat 5 timed. Return arm to side. Buttock Squeeze    On back, knees straight, legs together, not rotated outward. Squeezing buttocks together, say tight, tighter, tightest, or count 1, 2, 3. Hold _5__ seconds. Relax. Repeat _5-10__ times.   Thoracic Lift    Press shoulders down. Then lift mid-thoracic spine (area between the shoulder blades). Lift the breastbone slightly. Hold _5__ seconds. Relax. Repeat _5__ times.  Lower Trunk Rotation Stretch    Keeping back flat and feet together, rotate knees to left side. Repeat on other side. Hold _5___ seconds. Repeat ___10_ times per set.    [facility] [place], [place] [address]  [phone] (office) [phone] (fax)

## 2016-03-23 NOTE — Therapy (Signed)
[place], [place], [address] Phone: [phone]   Fax:  [phone]  Physical Therapy Treatment  Patient Details  Name: Annette Ramirez MRN: [id number] Date of Birth: July 28, 1945 Referring Provider: Dr. Georgina Snell   Encounter Date: 03/23/2016      PT End of Session - 03/23/16 0850    Visit Number 2   Number of Visits 16   Date for PT Re-Evaluation 05/12/16   PT Start Time 0848   PT Stop Time 0941   PT Time Calculation (min) 53 min   Activity Tolerance Patient tolerated treatment well   Behavior During Therapy [facility] for tasks assessed/performed      Past Medical History:  Diagnosis Date  . Anxiety   . Asthma   . COPD (chronic obstructive pulmonary disease) ([place])   . Depression   . Exertional shortness of breath   . Family history of anesthesia complication    "granddaughter has PONV" (02/28/2013)  . Fibromyalgia   . GERD (gastroesophageal reflux disease)   . H/O hiatal hernia   . Hypertension   . Migraines    "in the past; they've stopped" (02/28/2013)  . Osteoporosis   . PONV (postoperative nausea and vomiting)   . Sinus headache     Past Surgical History:  Procedure Laterality Date  . ABDOMINAL HYSTERECTOMY  1975  . APPENDECTOMY  1975  . BREAST CYST ASPIRATION Left 1980's  . COLONOSCOPY W/ BIOPSIES AND POLYPECTOMY  2012  . DILATION AND CURETTAGE OF UTERUS  1960's  . EXCISIONAL HEMORRHOIDECTOMY  1980's  . LUMBAR LAMINECTOMY  02/28/2013  . LUMBAR LAMINECTOMY/DECOMPRESSION MICRODISCECTOMY N/A 02/28/2013   Procedure: LUMBAR LAMINECTOMY/DECOMPRESSION MICRODISCECTOMY/Lumbar 4-5 decompression;  Surgeon: Sinclair Ship, MD;  Location: [facility];  Service: Orthopedics;  Laterality: N/A;  Lumbar 4-5 decompression  . TONSILLECTOMY  1950  . TUBAL LIGATION  1972    There were no vitals filed for this visit.      Subjective Assessment - 03/23/16 0851    Subjective Pt reports she was  sore after last visit and each time she's completed her HEP.  Mornings are the worst due to fibromyalgia.     Currently in Pain? Yes   Pain Score 4   had medication prior to treatment.     Pain Location Back   Pain Orientation Right;Lower   Pain Descriptors / Indicators Aching   Aggravating Factors  prolonged standing    Pain Relieving Factors heat    Multiple Pain Sites Yes   Pain Score 2   Pain Location Hip   Pain Orientation Left   Pain Descriptors / Indicators Sharp   Aggravating Factors  laying on Lt side    Pain Relieving Factors heat             [facility] PT Assessment - 03/23/16 0001      Assessment   Medical Diagnosis Low back pain without sciatica   Referring Provider Dr. Galen Daft Adult PT Treatment/Exercise - 03/23/16 0001      Lumbar Exercises: Stretches   Single Knee to Chest Stretch 2 reps;20 seconds   Single Knee to Chest Stretch Limitations pt began cramping in back and abdominals; stopped.    Lower Trunk Rotation 60 seconds   Lower Trunk Rotation Limitations (within pain free range)      Lumbar Exercises: Aerobic   Stationary Bike NuStep L3: 80m in      Lumbar  Exercises: Supine   Ab Set 5 reps;5 seconds   AB Set Limitations some cramping in stomach   Bridge --  2 reps   Bridge Limitations pt repeated cramped in LB and legs.; stopped.    Other Supine Lumbar Exercises 5 sec hold, 5 reps each:  thoracic lifts, arm lengthener, leg lengthener, leg press.       Moist Heat Therapy   Number Minutes Moist Heat 15 Minutes   Moist Heat Location Lumbar Spine     Electrical Stimulation   Electrical Stimulation Location low back   Electrical Stimulation Action IFC   Electrical Stimulation Parameters to tolerance    Electrical Stimulation Goals Pain                PT Education - 03/23/16 1136    Education provided Yes   Education Details HEP, info on TENS unit.     Person(s) Educated Patient   Methods Explanation;Handout;Verbal cues    Comprehension Verbalized understanding;Returned demonstration          PT Short Term Goals - 03/23/16 1142      PT SHORT TERM GOAL #1   Title Pt will be independent in initial HEP   Time 4   Period Weeks   Status On-going     PT SHORT TERM GOAL #2   Title Pt will tolerate standing x 10 minutes with pain < 4/10   Time 4   Period Weeks   Status On-going     PT SHORT TERM GOAL #3   Title Pt will improve bilat LE strength to 3+/5 to improve standing endurance   Time 4   Period Weeks   Status On-going           PT Long Term Goals - 03/23/16 1142      PT LONG TERM GOAL #1   Title Pt will be independent in advanced HEP   Time 8   Period Weeks   Status On-going     PT LONG TERM GOAL #2   Title Pt will improve FOTO to <=46% to demo improved mobility   Time 8   Period Weeks   Status On-going     PT LONG TERM GOAL #3   Title Pt will improve bilat LE strength to 4+/5 to increase activity tolerance   Time 8   Period Weeks   Status On-going     PT LONG TERM GOAL #4   Title Pt will stand in kitchen x 20 minutes with no increase in symptoms   Time 8   Period Weeks   Status On-going               Plan - 03/23/16 1131    Clinical Impression Statement Pt was unable to tolerate HEP issued at eval; complained she cramped and felt worse after each time.  HEP was altered to more gentle supine strengthening/stretching; she tolerated these during session.  She reported decreased pain at end of session with use of estim/ MHP.     Rehab Potential Good   PT Frequency 2x / week   PT Duration 8 weeks   PT Treatment/Interventions Cryotherapy;Moist Heat;Iontophoresis 4mg /ml Dexamethasone;Electrical Stimulation;Functional mobility training;Therapeutic activities;Patient/family education;Manual techniques;Dry needling;Passive range of motion;Taping;Therapeutic exercise;Balance training;DME Instruction;Neuromuscular re-education   PT Next Visit Plan Assess response to new HEP.   Issue info on body mechanics.    Consulted and Agree with Plan of Care Patient      Patient will benefit from skilled therapeutic intervention in order  to improve the following deficits and impairments:  Decreased endurance, Decreased activity tolerance, Decreased strength, Hypomobility, Decreased range of motion, Pain  Visit Diagnosis: Chronic midline low back pain without sciatica  Muscle weakness (generalized)     Problem List Patient Active Problem List   Diagnosis Date Noted  . Lumbago 03/15/2016  . Risk for coronary artery disease between 10% and 20% in next 10 years 05/30/2015  . Arteriosclerosis of aorta ([place]) 03/17/2015  . Thoracic neuritis 07/25/2012  . COPD exacerbation ([place]) 07/02/2010  . Depression 02/13/2009  . HEARING LOSS 09/25/2008  . GERD 10/10/2007  . INSOMNIA 03/21/2007  . ANXIETY DISORDER, GENERALIZED 03/10/2006  . TOBACCO ABUSE 03/10/2006  . HYPERTENSION, BENIGN ESSENTIAL 03/10/2006  . Fibromyalgia 03/10/2006  . Osteoporosis 03/10/2006  . Polymyositis ([place]) 03/10/2006   Kerin Perna, PTA 03/23/16 12:57 PM  [place], [place], [address] Phone: [phone]   Fax:  [phone]  Name: Annette Ramirez MRN: [id number] Date of Birth: [birth year]

## 2016-03-30 ENCOUNTER — Ambulatory Visit (INDEPENDENT_AMBULATORY_CARE_PROVIDER_SITE_OTHER): Payer: Medicare Other | Admitting: Physical Therapy

## 2016-03-30 DIAGNOSIS — M545 Low back pain, unspecified: Secondary | ICD-10-CM

## 2016-03-30 DIAGNOSIS — M6281 Muscle weakness (generalized): Secondary | ICD-10-CM

## 2016-03-30 DIAGNOSIS — G8929 Other chronic pain: Secondary | ICD-10-CM | POA: Diagnosis not present

## 2016-03-30 NOTE — Patient Instructions (Signed)
Bridge    Lie back, legs bent. Inhale, pressing hips up. Keeping ribs in, lengthen lower back. Exhale, rolling down along spine from top. Repeat __5__ times. Do _3___ sessions per week.  Piriformis Stretch - Supine    With both knees bent, cross ankle at opposite knee.  Pull uninvolved knee across body to opposite shoulder. Hold slight stretch for _20-30__ seconds. Repeat with involved leg. Repeat __2_ times. Do _1__ times per day.  Copyright  VHI. All rights reserved.

## 2016-03-30 NOTE — Therapy (Signed)
[place], [place], [address] Phone: [phone]   Fax:  [phone]  Physical Therapy Treatment  Patient Details  Name: Annette Ramirez MRN: [id number] Date of Birth: 11/21/1945 Referring Provider: Dr. Georgina Snell   Encounter Date: 03/30/2016      PT End of Session - 03/30/16 1222    Visit Number 3   Number of Visits 16   Date for PT Re-Evaluation 05/12/16   PT Start Time [id number]   PT Stop Time [id number]   PT Time Calculation (min) 53 min   Activity Tolerance Patient tolerated treatment well;No increased pain   Behavior During Therapy [facility] for tasks assessed/performed      Past Medical History:  Diagnosis Date  . Anxiety   . Asthma   . COPD (chronic obstructive pulmonary disease) ([place])   . Depression   . Exertional shortness of breath   . Family history of anesthesia complication    "granddaughter has PONV" (02/28/2013)  . Fibromyalgia   . GERD (gastroesophageal reflux disease)   . H/O hiatal hernia   . Hypertension   . Migraines    "in the past; they've stopped" (02/28/2013)  . Osteoporosis   . PONV (postoperative nausea and vomiting)   . Sinus headache     Past Surgical History:  Procedure Laterality Date  . ABDOMINAL HYSTERECTOMY  1975  . APPENDECTOMY  1975  . BREAST CYST ASPIRATION Left 1980's  . COLONOSCOPY W/ BIOPSIES AND POLYPECTOMY  2012  . DILATION AND CURETTAGE OF UTERUS  1960's  . EXCISIONAL HEMORRHOIDECTOMY  1980's  . LUMBAR LAMINECTOMY  02/28/2013  . LUMBAR LAMINECTOMY/DECOMPRESSION MICRODISCECTOMY N/A 02/28/2013   Procedure: LUMBAR LAMINECTOMY/DECOMPRESSION MICRODISCECTOMY/Lumbar 4-5 decompression;  Surgeon: Sinclair Ship, MD;  Location: [facility];  Service: Orthopedics;  Laterality: N/A;  Lumbar 4-5 decompression  . TONSILLECTOMY  1950  . TUBAL LIGATION  1972    There were no vitals filed for this visit.          [facility] PT Assessment - 03/30/16 0001      Assessment   Medical Diagnosis Low back pain without sciatica   Referring Provider Dr. Galen Daft Adult PT Treatment/Exercise - 03/30/16 0001      Lumbar Exercises: Stretches   Passive Hamstring Stretch 2 reps;20 seconds  each side with straight back    Lower Trunk Rotation 60 seconds   Lower Trunk Rotation Limitations (within pain free range)    Piriformis Stretch 2 reps;20 seconds     Lumbar Exercises: Aerobic   Stationary Bike NuStep L3: 5.5 min      Lumbar Exercises: Supine   Ab Set 5 reps;5 seconds  with pelvic floor contraction   Bridge --  6 reps, no cramping    Other Supine Lumbar Exercises 5 sec hold, 5 reps each:  thoracic lifts, arm lengthener, leg lengthener, leg press.       Lumbar Exercises: Sidelying   Clam 5 reps  2 sets     Moist Heat Therapy   Number Minutes Moist Heat 15 Minutes   Moist Heat Location Lumbar Spine     Electrical Stimulation   Electrical Stimulation Location low back   Electrical Stimulation Action IFC   Electrical Stimulation Parameters  to tolerance    Electrical Stimulation Goals Pain                PT Education - 03/30/16 1235  Education provided Yes   Education Details HEP - added piriformis stretch and bridges.    Person(s) Educated Patient   Methods Explanation;Demonstration;Handout   Comprehension Verbalized understanding          PT Short Term Goals - 03/23/16 1142      PT SHORT TERM GOAL #1   Title Pt will be independent in initial HEP   Time 4   Period Weeks   Status On-going     PT SHORT TERM GOAL #2   Title Pt will tolerate standing x 10 minutes with pain < 4/10   Time 4   Period Weeks   Status On-going     PT SHORT TERM GOAL #3   Title Pt will improve bilat LE strength to 3+/5 to improve standing endurance   Time 4   Period Weeks   Status On-going           PT Long Term Goals - 03/23/16 1142      PT LONG TERM GOAL #1   Title Pt will be independent in advanced HEP    Time 8   Period Weeks   Status On-going     PT LONG TERM GOAL #2   Title Pt will improve FOTO to <=46% to demo improved mobility   Time 8   Period Weeks   Status On-going     PT LONG TERM GOAL #3   Title Pt will improve bilat LE strength to 4+/5 to increase activity tolerance   Time 8   Period Weeks   Status On-going     PT LONG TERM GOAL #4   Title Pt will stand in kitchen x 20 minutes with no increase in symptoms   Time 8   Period Weeks   Status On-going               Plan - 03/30/16 1233    Clinical Impression Statement Pt demonstrated improved exercise tolerance, with no episodes of spasms or cramping.  She reported slight decrease in pain during exercises and further reduction with use of estim and MHP at end of session.  Progressing towards established goals.    Rehab Potential Good   PT Frequency 2x / week   PT Duration 8 weeks   PT Treatment/Interventions Cryotherapy;Moist Heat;Iontophoresis 4mg /ml Dexamethasone;Electrical Stimulation;Functional mobility training;Therapeutic activities;Patient/family education;Manual techniques;Dry needling;Passive range of motion;Taping;Therapeutic exercise;Balance training;DME Instruction;Neuromuscular re-education   PT Next Visit Plan Continue gentle spinal stabilization.  Issue info on body mechanics.    Consulted and Agree with Plan of Care Patient      Patient will benefit from skilled therapeutic intervention in order to improve the following deficits and impairments:  Decreased endurance, Decreased activity tolerance, Decreased strength, Hypomobility, Decreased range of motion, Pain  Visit Diagnosis: Chronic midline low back pain without sciatica  Muscle weakness (generalized)     Problem List Patient Active Problem List   Diagnosis Date Noted  . Lumbago 03/15/2016  . Risk for coronary artery disease between 10% and 20% in next 10 years 05/30/2015  . Arteriosclerosis of aorta ([place]) 03/17/2015  . Thoracic  neuritis 07/25/2012  . COPD exacerbation ([place]) 07/02/2010  . Depression 02/13/2009  . HEARING LOSS 09/25/2008  . GERD 10/10/2007  . INSOMNIA 03/21/2007  . ANXIETY DISORDER, GENERALIZED 03/10/2006  . TOBACCO ABUSE 03/10/2006  . HYPERTENSION, BENIGN ESSENTIAL 03/10/2006  . Fibromyalgia 03/10/2006  . Osteoporosis 03/10/2006  . Polymyositis ([place]) 03/10/2006   Kerin Perna, PTA 03/30/16 12:38 PM  [facility]  [place], [place], [address] Phone: [phone]   Fax:  [phone]  Name: Annette Ramirez MRN: [id number] Date of Birth: [birth year]

## 2016-04-01 ENCOUNTER — Ambulatory Visit (INDEPENDENT_AMBULATORY_CARE_PROVIDER_SITE_OTHER): Payer: Medicare Other | Admitting: Physical Therapy

## 2016-04-01 DIAGNOSIS — M545 Low back pain, unspecified: Secondary | ICD-10-CM

## 2016-04-01 DIAGNOSIS — G8929 Other chronic pain: Secondary | ICD-10-CM | POA: Diagnosis not present

## 2016-04-01 DIAGNOSIS — M6281 Muscle weakness (generalized): Secondary | ICD-10-CM | POA: Diagnosis not present

## 2016-04-01 NOTE — Therapy (Signed)
[name], [place], [address] Phone: [phone]   Fax:  [phone]  Physical Therapy Treatment  Patient Details  Name: Annette Ramirez MRN: [id number] Date of Birth: May 23, 1945 Referring Provider: Dr. Georgina Snell   Encounter Date: 04/01/2016      PT End of Session - 04/01/16 1456    Visit Number 4   Number of Visits 16   Date for PT Re-Evaluation 05/12/16   PT Start Time 4888   PT Stop Time 1545   PT Time Calculation (min) 58 min   Activity Tolerance Patient tolerated treatment well   Behavior During Therapy [facility] for tasks assessed/performed      Past Medical History:  Diagnosis Date  . Anxiety   . Asthma   . COPD (chronic obstructive pulmonary disease) ([place])   . Depression   . Exertional shortness of breath   . Family history of anesthesia complication    "granddaughter has PONV" (02/28/2013)  . Fibromyalgia   . GERD (gastroesophageal reflux disease)   . H/O hiatal hernia   . Hypertension   . Migraines    "in the past; they've stopped" (02/28/2013)  . Osteoporosis   . PONV (postoperative nausea and vomiting)   . Sinus headache     Past Surgical History:  Procedure Laterality Date  . ABDOMINAL HYSTERECTOMY  1975  . APPENDECTOMY  1975  . BREAST CYST ASPIRATION Left 1980's  . COLONOSCOPY W/ BIOPSIES AND POLYPECTOMY  2012  . DILATION AND CURETTAGE OF UTERUS  1960's  . EXCISIONAL HEMORRHOIDECTOMY  1980's  . LUMBAR LAMINECTOMY  02/28/2013  . LUMBAR LAMINECTOMY/DECOMPRESSION MICRODISCECTOMY N/A 02/28/2013   Procedure: LUMBAR LAMINECTOMY/DECOMPRESSION MICRODISCECTOMY/Lumbar 4-5 decompression;  Surgeon: Sinclair Ship, MD;  Location: [facility];  Service: Orthopedics;  Laterality: N/A;  Lumbar 4-5 decompression  . TONSILLECTOMY  1950  . TUBAL LIGATION  1972    There were no vitals filed for this visit.      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 Back pain is a little more intense because of this.    Pertinent History cyst removal 2014   Currently in Pain? Yes   Pain Score 7    Pain Location Back   Pain Orientation Right;Left;Lower   Pain Descriptors / Indicators Aching   Aggravating Factors  prolonged sitting and standing   Pain Relieving Factors heat, medicine            [facility] PT Assessment - 04/01/16 0001      Assessment   Medical Diagnosis Low back pain without sciatica   Referring Provider Dr. Galen Daft Adult PT Treatment/Exercise - 04/01/16 0001      Lumbar Exercises: Stretches   Passive Hamstring Stretch 2 reps;20 seconds  each side with straight back    Lower Trunk Rotation 60 seconds   Lower Trunk Rotation Limitations (within pain free range)    Piriformis Stretch 2 reps;30 seconds  each side     Lumbar Exercises: Aerobic   Stationary Bike NuStep L4: 6 min      Lumbar Exercises: Supine   Ab Set 5 reps;5 seconds  with pelvic floor contraction, 2 sets   Bent Knee Raise 5 reps  with ab set   Bent Knee Raise Limitations caused Lt low back spasm   Bridge 5 reps  2 sets, no cramping.  Isometric Hip Flexion 5 reps;3 seconds   Other Supine Lumbar Exercises 5 sec hold x 15 reps of arm/leg lengthener;      Lumbar Exercises: Sidelying   Clam 5 reps  2 sets     Moist Heat Therapy   Number Minutes Moist Heat 15 Minutes   Moist Heat Location Lumbar Spine     Electrical Stimulation   Electrical Stimulation Location low back    Electrical Stimulation Action IFC   Electrical Stimulation Parameters to tolerance    Electrical Stimulation Goals Pain                  PT Short Term Goals - 04/01/16 1602      PT SHORT TERM GOAL #1   Title Pt will be independent in initial HEP   Time 4   Period Weeks   Status Achieved     PT SHORT TERM GOAL #2   Title Pt will tolerate standing x 10 minutes with pain < 4/10   Time 4   Period Weeks    Status Achieved     PT SHORT TERM GOAL #3   Title Pt will improve bilat LE strength to 3+/5 to improve standing endurance   Time 4   Period Weeks   Status On-going           PT Long Term Goals - 04/01/16 1504      PT LONG TERM GOAL #1   Title Pt will be independent in advanced HEP   Time 8   Period Weeks   Status On-going     PT LONG TERM GOAL #2   Title Pt will improve FOTO to <=46% to demo improved mobility   Time 8   Period Weeks   Status On-going     PT LONG TERM GOAL #3   Title Pt will improve bilat LE strength to 4+/5 to increase activity tolerance   Time 8   Period Weeks   Status On-going     PT LONG TERM GOAL #4   Title Pt will stand in kitchen x 20 minutes with no increase in symptoms   Time 8   Period Weeks   Status On-going               Plan - 04/01/16 1517    Clinical Impression Statement Pt's exercise tolerance continues to improve.  She continues to have occasional cramping/spasms with certain exercises; resolved with rest.  Her low back pain is reduced with exercise, and she continues to get additional relief with estim/MHP.  She has met STG #1 and 2.      Rehab Potential Good   PT Frequency 2x / week   PT Duration 8 weeks   PT Treatment/Interventions Cryotherapy;Moist Heat;Iontophoresis '4mg'$ /ml Dexamethasone;Electrical Stimulation;Functional mobility training;Therapeutic activities;Patient/family education;Manual techniques;Dry needling;Passive range of motion;Taping;Therapeutic exercise;Balance training;DME Instruction;Neuromuscular re-education   PT Next Visit Plan Continue gentle spinal stabilization.     Consulted and Agree with Plan of Care Patient      Patient will benefit from skilled therapeutic intervention in order to improve the following deficits and impairments:  Decreased endurance, Decreased activity tolerance, Decreased strength, Hypomobility, Decreased range of motion, Pain  Visit Diagnosis: Chronic midline low back pain  without sciatica  Muscle weakness (generalized)     Problem List Patient Active Problem List   Diagnosis Date Noted  . Lumbago 03/15/2016  . Risk for coronary artery disease between 10% and 20% in next 10 years 05/30/2015  . Arteriosclerosis of  aorta ([place]) 03/17/2015  . Thoracic neuritis 07/25/2012  . COPD exacerbation ([place]) 07/02/2010  . Depression 02/13/2009  . HEARING LOSS 09/25/2008  . GERD 10/10/2007  . INSOMNIA 03/21/2007  . ANXIETY DISORDER, GENERALIZED 03/10/2006  . TOBACCO ABUSE 03/10/2006  . HYPERTENSION, BENIGN ESSENTIAL 03/10/2006  . Fibromyalgia 03/10/2006  . Osteoporosis 03/10/2006  . Polymyositis ([place]) 03/10/2006   Kerin Perna, PTA 04/01/16 4:03 PM  [place], [place], [address] Phone: [phone]   Fax:  [phone]  Name: NASHALIE SALLIS MRN: [id number] Date of Birth: March 20, 1946

## 2016-04-01 NOTE — Patient Instructions (Signed)
Abduction: Clam (Eccentric) - Side-Lying    Lie on side with knees bent. Lift top knee, keeping feet together. Keep trunk steady. Slowly lower for 3-5 seconds. ___ reps per set, ___ sets per day, ___ days per week. Add ___ lbs when you achieve ___ repetitions.  Sleeping on Back  Place pillow under knees. A pillow with cervical support and a roll around waist are also helpful. Copyright  VHI. All rights reserved.  Sleeping on Side Place pillow between knees. Use cervical support under neck and a roll around waist as needed. Copyright  VHI. All rights reserved.   Sleeping on Stomach   If this is the only desirable sleeping position, place pillow under lower legs, and under stomach or chest as needed.  Posture - Sitting   Sit upright, head facing forward. Try using a roll to support lower back. Keep shoulders relaxed, and avoid rounded back. Keep hips level with knees. Avoid crossing legs for long periods. Stand to Sit / Sit to Stand   To sit: Bend knees to lower self onto front edge of chair, then scoot back on seat. To stand: Reverse sequence by placing one foot forward, and scoot to front of seat. Use rocking motion to stand up.   Work Height and Reach  Ideal work height is no more than 2 to 4 inches below elbow level when standing, and at elbow level when sitting. Reaching should be limited to arm's length, with elbows slightly bent.  Bending  Bend at hips and knees, not back. Keep feet shoulder-width apart.    Posture - Standing   Good posture is important. Avoid slouching and forward head thrust. Maintain curve in low back and align ears over shoul- ders, hips over ankles.  Alternating Positions   Alternate tasks and change positions frequently to reduce fatigue and muscle tension. Take rest breaks. Computer Work   Position work to Programmer, multimedia. Use proper work and seat height. Keep shoulders back and down, wrists straight, and elbows at right angles. Use chair  that provides full back support. Add footrest and lumbar roll as needed.  Getting Into / Out of Car  Lower self onto seat, scoot back, then bring in one leg at a time. Reverse sequence to get out.  Dressing  Lie on back to pull socks or slacks over feet, or sit and bend leg while keeping back straight.    Housework - Sink  Place one foot on ledge of cabinet under sink when standing at sink for prolonged periods.   Pushing / Pulling  Pushing is preferable to pulling. Keep back in proper alignment, and use leg muscles to do the work.  Deep Squat   Squat and lift with both arms held against upper trunk. Tighten stomach muscles without holding breath. Use smooth movements to avoid jerking.  Avoid Twisting   Avoid twisting or bending back. Pivot around using foot movements, and bend at knees if needed when reaching for articles.  Carrying Luggage   Distribute weight evenly on both sides. Use a cart whenever possible. Do not twist trunk. Move body as a unit.   Lifting Principles .Maintain proper posture and head alignment. .Slide object as close as possible before lifting. .Move obstacles out of the way. .Test before lifting; ask for help if too heavy. .Tighten stomach muscles without holding breath. .Use smooth movements; do not jerk. .Use legs to do the work, and pivot with feet. .Distribute the work load symmetrically and close to the center of  trunk. .Push instead of pull whenever possible.   Ask For Help   Ask for help and delegate to others when possible. Coordinate your movements when lifting together, and maintain the low back curve.  Log Roll   Lying on back, bend left knee and place left arm across chest. Roll all in one movement to the right. Reverse to roll to the left. Always move as one unit. Housework - Sweeping  Use long-handled equipment to avoid stooping.   Housework - Wiping  Position yourself as close as possible to reach work surface.  Avoid straining your back.  Laundry - Unloading Wash   To unload small items at bottom of washer, lift leg opposite to arm being used to reach.  [place] close to area to be raked. Use arm movements to do the work. Keep back straight and avoid twisting.     Cart  When reaching into cart with one arm, lift opposite leg to keep back straight.   Getting Into / Out of Bed  Lower self to lie down on one side by raising legs and lowering head at the same time. Use arms to assist moving without twisting. Bend both knees to roll onto back if desired. To sit up, start from lying on side, and use same move-ments in reverse. Housework - Vacuuming  Hold the vacuum with arm held at side. Step back and forth to move it, keeping head up. Avoid twisting.   Laundry - IT consultant so that bending and twisting can be avoided.   Laundry - Unloading Dryer  Squat down to reach into clothes dryer or use a reacher.  Gardening - Weeding / Probation officer or Kneel. Knee pads may be helpful.

## 2016-04-05 ENCOUNTER — Telehealth: Payer: Self-pay

## 2016-04-05 ENCOUNTER — Other Ambulatory Visit: Payer: Self-pay

## 2016-04-05 ENCOUNTER — Other Ambulatory Visit: Payer: Self-pay | Admitting: Family Medicine

## 2016-04-05 DIAGNOSIS — M545 Low back pain, unspecified: Secondary | ICD-10-CM

## 2016-04-05 DIAGNOSIS — G8929 Other chronic pain: Secondary | ICD-10-CM

## 2016-04-05 DIAGNOSIS — Z1231 Encounter for screening mammogram for malignant neoplasm of breast: Secondary | ICD-10-CM | POA: Diagnosis not present

## 2016-04-05 DIAGNOSIS — Z0100 Encounter for examination of eyes and vision without abnormal findings: Secondary | ICD-10-CM | POA: Insufficient documentation

## 2016-04-05 LAB — HM MAMMOGRAPHY

## 2016-04-05 MED ORDER — OXYCODONE-ACETAMINOPHEN 5-325 MG PO TABS: 1.0000 | ORAL_TABLET | Freq: Four times a day (QID) | ORAL | 0 refills | Status: DC | PRN

## 2016-04-05 NOTE — Telephone Encounter (Signed)
Place in your box to sign.

## 2016-04-05 NOTE — Telephone Encounter (Signed)
Ok to refill 

## 2016-04-06 ENCOUNTER — Ambulatory Visit (INDEPENDENT_AMBULATORY_CARE_PROVIDER_SITE_OTHER): Payer: Medicare Other | Admitting: Physical Therapy

## 2016-04-06 DIAGNOSIS — G8929 Other chronic pain: Secondary | ICD-10-CM | POA: Diagnosis not present

## 2016-04-06 DIAGNOSIS — M545 Low back pain: Secondary | ICD-10-CM | POA: Diagnosis not present

## 2016-04-06 DIAGNOSIS — M6281 Muscle weakness (generalized): Secondary | ICD-10-CM | POA: Diagnosis not present

## 2016-04-06 NOTE — Telephone Encounter (Signed)
Worsening bowel bladder incontinence with chronic back pain. Plan for MRI and follow-up after MRI.

## 2016-04-06 NOTE — Therapy (Signed)
[place], [place], [address] Phone: [phone]   Fax:  [phone]  Physical Therapy Treatment  Patient Details  Name: Annette Ramirez MRN: [id number] Date of Birth: 1945-08-23 Referring Provider: Dr. Georgina Snell   Encounter Date: 04/06/2016      PT End of Session - 04/06/16 1152    Visit Number 5   Number of Visits 16   Date for PT Re-Evaluation 05/12/16   PT Start Time 5498   PT Stop Time 1239   PT Time Calculation (min) 53 min   Activity Tolerance Patient tolerated treatment well;No increased pain   Behavior During Therapy WFL for tasks assessed/performed      Past Medical History:  Diagnosis Date  . Anxiety   . Asthma   . COPD (chronic obstructive pulmonary disease) ([place])   . Depression   . Exertional shortness of breath   . Family history of anesthesia complication    "granddaughter has PONV" (02/28/2013)  . Fibromyalgia   . GERD (gastroesophageal reflux disease)   . H/O hiatal hernia   . Hypertension   . Migraines    "in the past; they've stopped" (02/28/2013)  . Osteoporosis   . PONV (postoperative nausea and vomiting)   . Sinus headache     Past Surgical History:  Procedure Laterality Date  . ABDOMINAL HYSTERECTOMY  1975  . APPENDECTOMY  1975  . BREAST CYST ASPIRATION Left 1980's  . COLONOSCOPY W/ BIOPSIES AND POLYPECTOMY  2012  . DILATION AND CURETTAGE OF UTERUS  1960's  . EXCISIONAL HEMORRHOIDECTOMY  1980's  . LUMBAR LAMINECTOMY  02/28/2013  . LUMBAR LAMINECTOMY/DECOMPRESSION MICRODISCECTOMY N/A 02/28/2013   Procedure: LUMBAR LAMINECTOMY/DECOMPRESSION MICRODISCECTOMY/Lumbar 4-5 decompression;  Surgeon: Sinclair Ship, MD;  Location: [facility];  Service: Orthopedics;  Laterality: N/A;  Lumbar 4-5 decompression  . TONSILLECTOMY  1950  . TUBAL LIGATION  1972    There were no vitals filed for this visit.      Subjective Assessment - 04/06/16 1154    Subjective She  had relief when she left last session.  She was able to stand in kitchen for meal prep 20 min at a time before needing to sit and rest.  She visited dad 5 days ago and was leaning over him (twisting) and her back has had increased pain since then.    Patient Stated Goals return to daily activities without pain   Currently in Pain? Yes   Pain Score 5    Pain Location Back   Pain Orientation Right;Left;Lower   Pain Descriptors / Indicators Aching   Aggravating Factors  prolonged sitting and standing   Pain Relieving Factors heat, medicine            [facility] PT Assessment - 04/06/16 0001      Assessment   Medical Diagnosis Low back pain without sciatica   Referring Provider Dr. Georgina Snell      Strength   Right/Left Hip Right;Left   Right Hip Flexion 5/5   Right Hip Extension 4+/5   Right Hip ABduction 4+/5   Left Hip Flexion 5/5   Left Hip Extension 4+/5   Left Hip ABduction 4/5           [facility] Adult PT Treatment/Exercise - 04/06/16 0001      Exercises   Exercises Lumbar;Knee/Hip     Lumbar Exercises: Stretches   Passive Hamstring Stretch 2 reps;20 seconds  each side with straight back    Lower Trunk Rotation 60  seconds   Lower Trunk Rotation Limitations (within pain free range)    Piriformis Stretch 2 reps;30 seconds  each side, supine   Piriformis Stretch Limitations repeated in sitting      Lumbar Exercises: Aerobic   Stationary Bike NuStep L4: 5 min      Lumbar Exercises: Supine   Ab Set 5 reps;5 seconds  with pelvic floor contraction, 2 sets   Bridge 15 reps   Other Supine Lumbar Exercises 5 sec hold x 10 reps of arm/leg lengthener, then leg press      Lumbar Exercises: Sidelying   Clam 10 reps  2 sets     Knee/Hip Exercises: Seated   Sit to Sand 5 reps;with UE support  legs very tremulous;VC for form and core engagement     Moist Heat Therapy   Number Minutes Moist Heat 15 Minutes   Moist Heat Location Lumbar Spine     Electrical Stimulation    Electrical Stimulation Location low back    Electrical Stimulation Action IFC   Electrical Stimulation Parameters to tolerance    Electrical Stimulation Goals Pain                  PT Short Term Goals - 04/06/16 1230      PT SHORT TERM GOAL #1   Title Pt will be independent in initial HEP   Time 4   Period Weeks   Status Achieved     PT SHORT TERM GOAL #2   Title Pt will tolerate standing x 10 minutes with pain < 4/10   Time 4   Period Weeks   Status Achieved     PT SHORT TERM GOAL #3   Title Pt will improve bilat LE strength to 3+/5 to improve standing endurance   Time 4   Period Weeks   Status Achieved           PT Long Term Goals - 04/06/16 1231      PT LONG TERM GOAL #1   Title Pt will be independent in advanced HEP   Time 8   Period Weeks   Status On-going     PT LONG TERM GOAL #2   Title Pt will improve FOTO to <=46% to demo improved mobility   Time 8   Period Weeks   Status On-going     PT LONG TERM GOAL #3   Title Pt will improve bilat LE strength to 4+/5 to increase activity tolerance   Time 8   Period Weeks   Status Partially Met     PT LONG TERM GOAL #4   Title Pt will stand in kitchen x 20 minutes with no increase in symptoms   Time 8   Period Weeks   Status Partially Met  symptoms greatly increase at 20 min               Plan - 04/06/16 1232    Clinical Impression Statement Pt's exercise tolerance improving each visit; now able to tolerate 10 reps with exercises before fatigue. She demonstrated improved LLE strength; has met STG and LTG #3.  Her standing tolerance has increased at home as well.  Making good gains towards remaining goals.    Rehab Potential Good   PT Frequency 2x / week   PT Duration 8 weeks   PT Treatment/Interventions Cryotherapy;Moist Heat;Iontophoresis 4mg/ml Dexamethasone;Electrical Stimulation;Functional mobility training;Therapeutic activities;Patient/family education;Manual techniques;Dry  needling;Passive range of motion;Taping;Therapeutic exercise;Balance training;DME Instruction;Neuromuscular re-education   PT Next Visit Plan Continue gentle   spinal stabilization.  Progress HEP as tolerated.  Educate pt on TENS use/application (hers should arrive soon)   Consulted and Agree with Plan of Care Patient      Patient will benefit from skilled therapeutic intervention in order to improve the following deficits and impairments:  Decreased endurance, Decreased activity tolerance, Decreased strength, Hypomobility, Decreased range of motion, Pain  Visit Diagnosis: Chronic midline low back pain without sciatica  Muscle weakness (generalized)     Problem List Patient Active Problem List   Diagnosis Date Noted  . Encounter for chronic pain management 04/05/2016  . Lumbago 03/15/2016  . Risk for coronary artery disease between 10% and 20% in next 10 years 05/30/2015  . Arteriosclerosis of aorta ([facility]) 03/17/2015  . Thoracic neuritis 07/25/2012  . COPD exacerbation ([facility]) 07/02/2010  . Depression 02/13/2009  . HEARING LOSS 09/25/2008  . GERD 10/10/2007  . INSOMNIA 03/21/2007  . ANXIETY DISORDER, GENERALIZED 03/10/2006  . TOBACCO ABUSE 03/10/2006  . HYPERTENSION, BENIGN ESSENTIAL 03/10/2006  . Fibromyalgia 03/10/2006  . Osteoporosis 03/10/2006  . Polymyositis ([facility]) 03/10/2006   Jennifer Carlson-Long, PTA 04/06/16 12:35 PM  [facility] [address] , [place], [address] Phone: [phone]   Fax:  [phone]  Name: Kynsley R Balint MRN: [id number] Date of Birth: 12/20/1945    

## 2016-04-06 NOTE — Telephone Encounter (Signed)
Imaging authorized and location notified.

## 2016-04-08 ENCOUNTER — Ambulatory Visit (HOSPITAL_BASED_OUTPATIENT_CLINIC_OR_DEPARTMENT_OTHER)
Admission: RE | Admit: 2016-04-08 | Discharge: 2016-04-08 | Disposition: A | Discharge status: Home / Self Care | Payer: Medicare Other | Source: Ambulatory Visit | Attending: Family Medicine | Admitting: Family Medicine

## 2016-04-08 ENCOUNTER — Encounter: Payer: Self-pay | Admitting: Physical Therapy

## 2016-04-08 DIAGNOSIS — G8929 Other chronic pain: Secondary | ICD-10-CM

## 2016-04-08 DIAGNOSIS — Z9889 Other specified postprocedural states: Secondary | ICD-10-CM | POA: Insufficient documentation

## 2016-04-08 DIAGNOSIS — M48061 Spinal stenosis, lumbar region without neurogenic claudication: Secondary | ICD-10-CM | POA: Diagnosis not present

## 2016-04-08 DIAGNOSIS — M545 Low back pain: Secondary | ICD-10-CM | POA: Diagnosis not present

## 2016-04-08 DIAGNOSIS — M5126 Other intervertebral disc displacement, lumbar region: Secondary | ICD-10-CM | POA: Diagnosis not present

## 2016-04-12 ENCOUNTER — Ambulatory Visit (INDEPENDENT_AMBULATORY_CARE_PROVIDER_SITE_OTHER): Payer: Medicare Other | Admitting: Family Medicine

## 2016-04-12 ENCOUNTER — Encounter: Payer: Self-pay | Admitting: Physical Therapy

## 2016-04-12 ENCOUNTER — Encounter: Payer: Self-pay | Admitting: Family Medicine

## 2016-04-12 VITALS — BP 141/74 | HR 64 | Wt 161.0 lb

## 2016-04-12 DIAGNOSIS — M545 Low back pain, unspecified: Secondary | ICD-10-CM

## 2016-04-12 DIAGNOSIS — N3941 Urge incontinence: Secondary | ICD-10-CM

## 2016-04-12 DIAGNOSIS — G8929 Other chronic pain: Secondary | ICD-10-CM

## 2016-04-12 DIAGNOSIS — G250 Essential tremor: Secondary | ICD-10-CM

## 2016-04-12 DIAGNOSIS — R159 Full incontinence of feces: Secondary | ICD-10-CM

## 2016-04-12 NOTE — Progress Notes (Signed)
Annette Ramirez is a 70 y.o. female who presents to [facility] today for follow up back pain, bowel and bladder incontinence.  Since being seen for lower back pain 1 month ago, she says PT has helped her leg strength and balance. However, the pain has worsened and now has moved from her right side to both sides. Still denies radiating pain or numbness.  She has bowel incontinence that started 2 months ago. No urgency, but will suddenly have watery, nonbloody diarrhea without warning. No fevers. She reports having a history of colitis, peptic ulcer, and IBS several years ago.   She has had urinary incontinence for the past year. Urologist gave her vesicare, which she feels helps somewhat. Incontinence has worsened over the past 2 months.  She reports having an ongoing tremor in her face, then hands and legs, that gets worse with fine motor tasks such as drinking coffee.   Past Medical History:  Diagnosis Date  . Anxiety   . Asthma   . COPD (chronic obstructive pulmonary disease) ([place])   . Depression   . Exertional shortness of breath   . Family history of anesthesia complication    "granddaughter has PONV" (02/28/2013)  . Fibromyalgia   . GERD (gastroesophageal reflux disease)   . H/O hiatal hernia   . Hypertension   . Migraines    "in the past; they've stopped" (02/28/2013)  . Osteoporosis   . PONV (postoperative nausea and vomiting)   . Sinus headache    Past Surgical History:  Procedure Laterality Date  . ABDOMINAL HYSTERECTOMY  1975  . APPENDECTOMY  1975  . BREAST CYST ASPIRATION Left 1980's  . COLONOSCOPY W/ BIOPSIES AND POLYPECTOMY  2012  . DILATION AND CURETTAGE OF UTERUS  1960's  . EXCISIONAL HEMORRHOIDECTOMY  1980's  . LUMBAR LAMINECTOMY  02/28/2013  . LUMBAR LAMINECTOMY/DECOMPRESSION MICRODISCECTOMY N/A 02/28/2013   Procedure: LUMBAR LAMINECTOMY/DECOMPRESSION MICRODISCECTOMY/Lumbar 4-5 decompression;  Surgeon: Sinclair Ship, MD;  Location: [facility];  Service: Orthopedics;  Laterality: N/A;  Lumbar 4-5 decompression  . TONSILLECTOMY  1950  . TUBAL LIGATION  1972   Social History  Substance Use Topics  . Smoking status: Current Some Day Smoker    Packs/day: 0.25    Years: 40.00    Types: Cigarettes  . Smokeless tobacco: Never Used     Comment: 02/28/2013 "started smoking eletronic cigarettes a couple years ago"  . Alcohol use 3.0 oz/week    5 Glasses of wine per week     ROS:  As above   Medications: Current Outpatient Prescriptions  Medication Sig Dispense Refill  . albuterol (PROVENTIL HFA;VENTOLIN HFA) 108 (90 BASE) MCG/ACT inhaler Inhale 2 puffs into the lungs every 6 (six) hours as needed for wheezing. 18 g 5  . atenolol (TENORMIN) 100 MG tablet Take 1 tablet by mouth two  times daily 180 tablet 1  . atorvastatin (LIPITOR) 20 MG tablet Take 1 tablet by mouth  daily 90 tablet 1  . citalopram (CELEXA) 20 MG tablet TAKE 1 TABLET BY MOUTH  DAILY 90 tablet 1  . losartan-hydrochlorothiazide (HYZAAR) 100-12.5 MG tablet Take 1 tablet by mouth daily. 90 tablet 1  . mirtazapine (REMERON) 15 MG tablet Take 1 tablet by mouth at  bedtime 90 tablet 1  . omeprazole (PRILOSEC) 40 MG capsule Take 1 capsule by mouth  daily 90 capsule 1  . oxyCODONE-acetaminophen (ROXICET) 5-325 MG tablet Take 1 tablet by  mouth every 6 (six) hours as needed for severe pain. 60 tablet 0  . VESICARE 5 MG tablet TAKE 1 TABLET BY MOUTH  DAILY 30 tablet 11   No current facility-administered medications for this visit.    Allergies  Allergen Reactions  . Amoxicillin-Pot Clavulanate     REACTION: nausea and abdominal pain  . Aspirin     REACTION: upset stomach  . Codeine     REACTION: nausea     Exam:  BP (!) 141/74   Pulse 64   Wt 161 lb (73 kg)   BMI 29.45 kg/m  General: Well Developed, well nourished, and in no acute distress.  Neuro/Psych: Alert and oriented x3, extra-ocular muscles intact, able to move  all 4 extremities, sensation grossly intact. Skin: Warm and dry, no rashes noted.  Respiratory: Not using accessory muscles, speaking in full sentences, trachea midline.  Cardiovascular: Pulses palpable, no extremity edema. Abdomen: Does not appear distended. Rectal: Slightly diminished rectal tone. Intact voluntary squeeze and relax. MSK:  Lower back: Lumbar spine with vertical scar over L4/L5 No obvious spinal deformity Tender to palpation over lumbar spine and bilateral SI joint areas Lower extremities with full strength and intact sensation bilaterally Reflexes intact bilaterally  Generalized tremor in upper and lower extremities, R>L.  Slight cogwheel rigidity in right upper extremity.   No results found for this or any previous visit (from the past 48 hour(s)). Mr Lumbar Spine Wo Contrast  Result Date: 04/08/2016 CLINICAL DATA:  70 year old female with chronic but progressive lumbar back pain. New bowel and bladder incontinence. Lower extremity numbness and spasms. History of surgery for left side synovial cyst at L4-L5 in 2014. Initial encounter. EXAM: MRI LUMBAR SPINE WITHOUT CONTRAST TECHNIQUE: Multiplanar, multisequence MR imaging of the lumbar spine was performed. No intravenous contrast was administered. COMPARISON:  Lumbar radiographs 03/15/2016. Preoperative lumbar MRI 01/30/2013. FINDINGS: Segmentation: Same numbering system used as on the 2014 MRI designating normal lumbar segmentation. Alignment: New mild grade 1 anterolisthesis of L4 on L5. Otherwise stable vertebral height and alignment. Vertebrae: No marrow edema or evidence of acute osseous abnormality. Chronic degenerative endplate changes at [id number] anteriorly. Visible sacrum intact. Conus medullaris: Extends to the L1-L2 level and appears normal. Paraspinal and other soft tissues: Postoperative changes to the posterior paraspinal soft tissues at L4-L5. No postoperative fluid collection. Negative visualized abdominal  viscera. Disc levels: T11-T12: Chronic disc and endplate degeneration. Mild facet hypertrophy. No significant stenosis. T12-L1:  Negative. L1-L2:  Negative. L2-L3: Circumferential disc bulge. Small superimposed left paracentral disc protrusion is new or increased (series 6, image 21). Stable mild facet and ligament flavum hypertrophy. New borderline to mild left lateral recess stenosis. No spinal or foraminal stenosis. L3-L4: Stable mostly far lateral disc bulging. Stable to mildly increased facet and ligament flavum hypertrophy, mild to moderate. Increased facet joint fluid. Still there is no significant stenosis at this level. L4-L5: Postoperative changes to the left posterior elements. Resection of the previously seen synovial cyst on that side. However, progression of right facet arthropathy, now severe. Right facet joint fluid is new. Moderate ligament flavum hypertrophy also increased. New mild anterolisthesis. Left eccentric circumferential disc bulging appears increased with broad-based left subarticular component. Improved spinal and left lateral recess patency, with left lateral recess granulation tissue. New mild left L4 foraminal stenosis. No significant right foraminal stenosis. L5-S1: Stable trace anterolisthesis. Chronic severe facet hypertrophy appears stable aside from increased facet joint fluid. Mild circumferential disc bulge is stable. Epidural lipomatosis is increased. No stenosis.  IMPRESSION: 1. Postoperative changes to the left posterior elements and the left lateral recess at L4-L5 with new mild grade 1 anterolisthesis at that level, and progressed now severe right side facet arthropathy. Still, thecal sac and left lateral recess patency at that level is improved following the synovial cyst resection. There is new mild left L4 neural foraminal stenosis. 2. Mild progression of posterior element degeneration at L3-L4 without stenosis. Stable severe facet arthropathy at L5-S1 without stenosis.  3. New tiny disc herniation at L2-L3 resulting in up to mild left lateral recess stenosis. Electronically Signed   By: Genevie Ann M.D.   On: 04/08/2016 17:22    Assessment and Plan: 70 y.o. female with chronic back pain, bowel and bladder incontinence, and essential tremor.  Chronic back pain: Facet DJD noted on MRI. Continue physical therapy; schedule for back injection.  Bowel and bladder incontinence: Unlikely to be related to back pain given unimpressive rectal exam and absence of sacral nerve involvement on MRI. Advised patient to follow up with her urologist for ongoing urge incontinence. Follow up with GI doc given her GI history.  Essential tremor: Patient does not find this bothersome enough to start medication. Continue to follow.   Follow up in 1-2 months.   No orders of the defined types were placed in this encounter.   Discussed warning signs or symptoms. Please see discharge instructions. Patient expresses understanding.

## 2016-04-12 NOTE — Patient Instructions (Signed)
Thank you for coming in today. You should hear about the back injection scheduling soon.  Let me know if you do not hear anything.  Call you stomach doctor about the incontinence.   I think you have essential tremor.  We will keep an eye on it.   Return for recheck in 1-2 months.   Essential Tremor Introduction A tremor is trembling or shaking that you cannot control. Most tremors affect the hands or arms. Tremors can also affect the head, vocal cords, face, and other parts of the body. Essential tremor is a tremor without a known cause. What are the causes? Essential tremor has no known cause. What increases the risk? You may be at greater risk of essential tremor if:  You have a family member with essential tremor.  You are age 72 or older.  You take certain medicines. What are the signs or symptoms? The main sign of a tremor is uncontrolled and unintentional rhythmic shaking of a body part.  You may have difficulty eating with a spoon or fork.  You may have difficulty writing.  You may nod your head up and down or side to side.  You may have a quivering voice. Your tremors:  May get worse over time.  May come and go.  May be more noticeable on one side of your body.  May get worse due to stress, fatigue, caffeine, and extreme heat or cold. How is this diagnosed? Your health care provider can diagnose essential tremor based on your symptoms, medical history, and a physical examination. There is no single test to diagnose an essential tremor. However, your health care provider may perform a variety of tests to rule out other conditions. Tests may include:  Blood and urine tests.  Imaging studies of your brain, such as:  CT scan.  MRI.  A test that measures involuntary muscle movement (electromyogram). How is this treated? Your tremors may go away without treatment. Mild tremors may not need treatment if they do not affect your day-to-day life. Severe tremors  may need to be treated using one or a combination of the following options:  Medicines. This may include medicine that is injected.  Lifestyle changes.  Physical therapy. Follow these instructions at home:  Take medicines only as directed by your health care provider.  Limit alcohol intake to no more than 1 drink per day for nonpregnant women and 2 drinks per day for men. One drink equals 12 oz of beer, 5 oz of wine, or 1 oz of hard liquor.  Do not use any tobacco products, including cigarettes, chewing tobacco, or electronic cigarettes. If you need help quitting, ask your health care provider.  Take medicines only as directed by your health care provider.  Avoid extreme heat or cold.  Limit the amount of caffeine you consumeas directed by your health care provider.  Try to get eight hours of sleep each night.  Find ways to manage your stress, such as meditation or yoga.  Keep all follow-up visits as directed by your health care provider. This is important. This includes any physical therapy visits. Contact a health care provider if:  You experience any changes in the location or intensity of your tremors.  You start having a tremor after starting a new medicine.  You have tremor with other symptoms such as:  Numbness.  Tingling.  Pain.  Weakness.  Your tremor gets worse.  Your tremor interferes with your daily life. This information is not intended to replace  advice given to you by your health care provider. Make sure you discuss any questions you have with your health care provider. Document Released: 05/10/2014 Document Revised: 09/25/2015 Document Reviewed: 10/15/2013  2017 Elsevier

## 2016-04-14 ENCOUNTER — Ambulatory Visit (INDEPENDENT_AMBULATORY_CARE_PROVIDER_SITE_OTHER): Payer: Medicare Other | Admitting: Physical Therapy

## 2016-04-14 DIAGNOSIS — M6281 Muscle weakness (generalized): Secondary | ICD-10-CM

## 2016-04-14 DIAGNOSIS — M545 Low back pain, unspecified: Secondary | ICD-10-CM

## 2016-04-14 DIAGNOSIS — G8929 Other chronic pain: Secondary | ICD-10-CM | POA: Diagnosis not present

## 2016-04-14 NOTE — Therapy (Addendum)
[facility] [place], [place], [address] Phone: [phone]   Fax:  [phone]  Physical Therapy Treatment  Patient Details  Name: Annette Ramirez MRN: [id number] Date of Birth: Dec 13, 1945 Referring Provider: Dr. Georgina Snell  Encounter Date: 04/14/2016      PT End of Session - 04/14/16 1450    Visit Number 6   Number of Visits 16   Date for PT Re-Evaluation 05/12/16   PT Start Time 4081   PT Stop Time 1517   PT Time Calculation (min) 38 min   Activity Tolerance Patient limited by fatigue;Patient limited by pain      Past Medical History:  Diagnosis Date  . Anxiety   . Asthma   . COPD (chronic obstructive pulmonary disease) ([place])   . Depression   . Exertional shortness of breath   . Family history of anesthesia complication    "granddaughter has PONV" (02/28/2013)  . Fibromyalgia   . GERD (gastroesophageal reflux disease)   . H/O hiatal hernia   . Hypertension   . Migraines    "in the past; they've stopped" (02/28/2013)  . Osteoporosis   . PONV (postoperative nausea and vomiting)   . Sinus headache     Past Surgical History:  Procedure Laterality Date  . ABDOMINAL HYSTERECTOMY  1975  . APPENDECTOMY  1975  . BREAST CYST ASPIRATION Left 1980's  . COLONOSCOPY W/ BIOPSIES AND POLYPECTOMY  2012  . DILATION AND CURETTAGE OF UTERUS  1960's  . EXCISIONAL HEMORRHOIDECTOMY  1980's  . LUMBAR LAMINECTOMY  02/28/2013  . LUMBAR LAMINECTOMY/DECOMPRESSION MICRODISCECTOMY N/A 02/28/2013   Procedure: LUMBAR LAMINECTOMY/DECOMPRESSION MICRODISCECTOMY/Lumbar 4-5 decompression;  Surgeon: Sinclair Ship, MD;  Location: [facility];  Service: Orthopedics;  Laterality: N/A;  Lumbar 4-5 decompression  . TONSILLECTOMY  1950  . TUBAL LIGATION  1972    There were no vitals filed for this visit.      Subjective Assessment - 04/14/16 1508    Subjective Pt reports she is emotional and stressed due to increased back  pain and GI issues (multiple trips to bathroom prior to therapy).  She reports her tremor is worse when she is tired or stressed.    Patient Stated Goals return to daily activities without pain   Currently in Pain? Yes   Pain Score 7    Pain Location Back   Pain Orientation Lower   Pain Descriptors / Indicators Aching   Aggravating Factors  prolonged sitting and standing    Pain Relieving Factors heat/ medicine.             [facility] PT Assessment - 04/14/16 0001      Assessment   Medical Diagnosis Low back pain without sciatica   Referring Provider Dr. Galen Daft Adult PT Treatment/Exercise - 04/14/16 0001      Self-Care   Self-Care Other Self-Care Comments   Other Self-Care Comments  Educated pt on diaphragmatic  breathing and use/application of TENS unit.  Pt verbalized understanding and returned demo.       Lumbar Exercises: Aerobic   Stationary Bike NuStep L3: 5 min      Lumbar Exercises: Supine   Other Supine Lumbar Exercises Arm/Leg lengthener x 3 sec hold x 10 reps each side.    Other Supine Lumbar Exercises diaghragmatic breathing x  10 reps (breathing in 4, exhaling for 4).  Modalities   Modalities Moist Heat;Electrical Stimulation     Moist Heat Therapy   Number Minutes Moist Heat 15 Minutes   Moist Heat Location Lumbar Spine     Electrical Stimulation   Electrical Stimulation Location low back paraspinals   Electrical Stimulation Action IFC   Electrical Stimulation Parameters to tolerance    Electrical Stimulation Goals Pain                  PT Short Term Goals - 04/06/16 1230      PT SHORT TERM GOAL #1   Title Pt will be independent in initial HEP   Time 4   Period Weeks   Status Achieved     PT SHORT TERM GOAL #2   Title Pt will tolerate standing x 10 minutes with pain < 4/10   Time 4   Period Weeks   Status Achieved     PT SHORT TERM GOAL #3   Title Pt will improve bilat LE strength to 3+/5 to  improve standing endurance   Time 4   Period Weeks   Status Achieved           PT Long Term Goals - 04/06/16 1231      PT LONG TERM GOAL #1   Title Pt will be independent in advanced HEP   Time 8   Period Weeks   Status On-going     PT LONG TERM GOAL #2   Title Pt will improve FOTO to <=46% to demo improved mobility   Time 8   Period Weeks   Status On-going     PT LONG TERM GOAL #3   Title Pt will improve bilat LE strength to 4+/5 to increase activity tolerance   Time 8   Period Weeks   Status Partially Met     PT LONG TERM GOAL #4   Title Pt will stand in kitchen x 20 minutes with no increase in symptoms   Time 8   Period Weeks   Status Partially Met  symptoms greatly increase at 20 min               Plan - 04/14/16 1507    Clinical Impression Statement Pt's exercise tolerance limited today due to GI irritation.  She returned demo of TENS unit in preparation for arrival of her home unit.     Rehab Potential Good   PT Frequency 2x / week   PT Duration 8 weeks   PT Treatment/Interventions Cryotherapy;Moist Heat;Iontophoresis 59m/ml Dexamethasone;Electrical Stimulation;Functional mobility training;Therapeutic activities;Patient/family education;Manual techniques;Dry needling;Passive range of motion;Taping;Therapeutic exercise;Balance training;DME Instruction;Neuromuscular re-education   PT Next Visit Plan Continue gentle spinal stabilization.  Progress HEP as tolerated.     Consulted and Agree with Plan of Care Patient      Patient will benefit from skilled therapeutic intervention in order to improve the following deficits and impairments:  Decreased endurance, Decreased activity tolerance, Decreased strength, Hypomobility, Decreased range of motion, Pain  Visit Diagnosis: Chronic midline low back pain without sciatica  Muscle weakness (generalized)     Problem List Patient Active Problem List   Diagnosis Date Noted  . Encounter for chronic pain  management 04/05/2016  . Chronic lower back pain 03/15/2016  . Risk for coronary artery disease between 10% and 20% in next 10 years 05/30/2015  . Arteriosclerosis of aorta ([facility] 03/17/2015  . Thoracic neuritis 07/25/2012  . COPD exacerbation ([place] 07/02/2010  . Depression 02/13/2009  . HEARING LOSS 09/25/2008  . GERD 10/10/2007  .  INSOMNIA 03/21/2007  . ANXIETY DISORDER, GENERALIZED 03/10/2006  . TOBACCO ABUSE 03/10/2006  . HYPERTENSION, BENIGN ESSENTIAL 03/10/2006  . Fibromyalgia 03/10/2006  . Osteoporosis 03/10/2006  . Polymyositis ([place]) 03/10/2006   Kerin Perna, PTA 04/14/16 3:11 PM  [facility] [place], [place], [address] Phone: [phone]   Fax:  [phone]  Name: Annette Ramirez MRN: [id number] Date of Birth: 03/11/1946  PHYSICAL THERAPY DISCHARGE SUMMARY  Visits from Start of Care: 6  Current functional level related to goals / functional outcomes: unknown   Remaining deficits: unknown   Education / Equipment: HEP Plan:                                                    Patient goals were partially met. Patient is being discharged due to not returning since the last visit.  ?????    Jeral Pinch, PT 05/18/16 10:00 AM

## 2016-04-15 NOTE — Addendum Note (Signed)
Addended by: Gregor Hams on: 04/15/2016 03:05 PM   Modules accepted: Orders

## 2016-04-20 ENCOUNTER — Encounter: Payer: Self-pay | Admitting: Physical Therapy

## 2016-04-21 ENCOUNTER — Encounter (HOSPITAL_BASED_OUTPATIENT_CLINIC_OR_DEPARTMENT_OTHER): Payer: Self-pay

## 2016-04-23 ENCOUNTER — Encounter: Payer: Self-pay | Admitting: Physical Therapy

## 2016-04-29 ENCOUNTER — Ambulatory Visit
Admission: RE | Admit: 2016-04-29 | Discharge: 2016-04-29 | Disposition: A | Discharge status: Home / Self Care | Payer: Medicare Other | Source: Ambulatory Visit | Attending: Family Medicine | Admitting: Family Medicine

## 2016-04-29 DIAGNOSIS — M545 Low back pain: Secondary | ICD-10-CM | POA: Diagnosis not present

## 2016-04-29 MED ORDER — METHYLPREDNISOLONE ACETATE 40 MG/ML INJ SUSP (RADIOLOG
120.0000 mg | Freq: Once | INTRAMUSCULAR | Status: AC
  Administered 2016-04-29: 120 mg via INTRA_ARTICULAR

## 2016-04-29 MED ORDER — IOPAMIDOL (ISOVUE-M 200) INJECTION 41%
1.0000 mL | Freq: Once | INTRAMUSCULAR | Status: AC
  Administered 2016-04-29: 1 mL via INTRA_ARTICULAR

## 2016-04-29 NOTE — Discharge Instructions (Signed)

## 2016-04-30 ENCOUNTER — Encounter: Payer: Self-pay | Admitting: Family Medicine

## 2016-05-18 ENCOUNTER — Other Ambulatory Visit: Payer: Self-pay | Admitting: Family Medicine

## 2016-05-23 ENCOUNTER — Ambulatory Visit (HOSPITAL_BASED_OUTPATIENT_CLINIC_OR_DEPARTMENT_OTHER): Payer: Self-pay

## 2016-05-24 ENCOUNTER — Other Ambulatory Visit: Payer: Self-pay

## 2016-05-24 MED ORDER — OXYCODONE-ACETAMINOPHEN 5-325 MG PO TABS: 1.0000 | ORAL_TABLET | Freq: Four times a day (QID) | ORAL | 0 refills | Status: DC | PRN

## 2016-05-24 NOTE — Progress Notes (Signed)
Patient advised to schedule a follow up appointment for fibromyalgia. She takes oxycodone for the fibromyalgia. She stated she would.

## 2016-05-31 DIAGNOSIS — R1310 Dysphagia, unspecified: Secondary | ICD-10-CM | POA: Diagnosis not present

## 2016-05-31 DIAGNOSIS — K219 Gastro-esophageal reflux disease without esophagitis: Secondary | ICD-10-CM | POA: Diagnosis not present

## 2016-05-31 DIAGNOSIS — Z86010 Personal history of colonic polyps: Secondary | ICD-10-CM | POA: Diagnosis not present

## 2016-05-31 DIAGNOSIS — K529 Noninfective gastroenteritis and colitis, unspecified: Secondary | ICD-10-CM | POA: Diagnosis not present

## 2016-06-07 DIAGNOSIS — K529 Noninfective gastroenteritis and colitis, unspecified: Secondary | ICD-10-CM | POA: Diagnosis not present

## 2016-06-07 DIAGNOSIS — K5190 Ulcerative colitis, unspecified, without complications: Secondary | ICD-10-CM | POA: Diagnosis not present

## 2016-06-12 ENCOUNTER — Other Ambulatory Visit: Payer: Self-pay | Admitting: Family Medicine

## 2016-06-17 ENCOUNTER — Other Ambulatory Visit: Payer: Self-pay | Admitting: Family Medicine

## 2016-07-13 ENCOUNTER — Ambulatory Visit (INDEPENDENT_AMBULATORY_CARE_PROVIDER_SITE_OTHER): Payer: Medicare Other | Admitting: Family Medicine

## 2016-07-13 VITALS — BP 131/67 | HR 75 | Wt 157.0 lb

## 2016-07-13 DIAGNOSIS — F331 Major depressive disorder, recurrent, moderate: Secondary | ICD-10-CM

## 2016-07-13 DIAGNOSIS — M545 Low back pain, unspecified: Secondary | ICD-10-CM

## 2016-07-13 DIAGNOSIS — I10 Essential (primary) hypertension: Secondary | ICD-10-CM | POA: Diagnosis not present

## 2016-07-13 DIAGNOSIS — F5101 Primary insomnia: Secondary | ICD-10-CM | POA: Diagnosis not present

## 2016-07-13 DIAGNOSIS — M47816 Spondylosis without myelopathy or radiculopathy, lumbar region: Secondary | ICD-10-CM | POA: Insufficient documentation

## 2016-07-13 DIAGNOSIS — G8929 Other chronic pain: Secondary | ICD-10-CM | POA: Diagnosis not present

## 2016-07-13 DIAGNOSIS — M4696 Unspecified inflammatory spondylopathy, lumbar region: Secondary | ICD-10-CM | POA: Diagnosis not present

## 2016-07-13 DIAGNOSIS — M797 Fibromyalgia: Secondary | ICD-10-CM | POA: Diagnosis not present

## 2016-07-13 MED ORDER — OXYCODONE-ACETAMINOPHEN 5-325 MG PO TABS: 1.0000 | ORAL_TABLET | Freq: Four times a day (QID) | ORAL | 0 refills | Status: DC | PRN

## 2016-07-13 MED ORDER — ATORVASTATIN CALCIUM 20 MG PO TABS: 20.0000 mg | ORAL_TABLET | Freq: Every day | ORAL | 3 refills | Status: DC

## 2016-07-13 MED ORDER — LOSARTAN POTASSIUM-HCTZ 100-12.5 MG PO TABS: ORAL_TABLET | ORAL | 1 refills | Status: DC

## 2016-07-13 MED ORDER — ATENOLOL 100 MG PO TABS: 100.0000 mg | ORAL_TABLET | Freq: Two times a day (BID) | ORAL | 1 refills | Status: DC

## 2016-07-13 MED ORDER — ESZOPICLONE 2 MG PO TABS: 2.0000 mg | ORAL_TABLET | Freq: Every evening | ORAL | 2 refills | Status: DC | PRN

## 2016-07-13 MED ORDER — CITALOPRAM HYDROBROMIDE 20 MG PO TABS: 20.0000 mg | ORAL_TABLET | Freq: Every day | ORAL | 1 refills | Status: DC

## 2016-07-13 NOTE — Patient Instructions (Signed)
Thank you for coming in today. We will arrange for ablation of the little nerves to that joint in the back.  You should hear from the radiology group soon.  Let me know if you do not hear anything in the next few days.   Recheck after ablation.

## 2016-07-13 NOTE — Progress Notes (Signed)
Subjective:    CC: HTN, Fibro   HPI: Hypertension- Pt denies chest pain, SOB, dizziness, or heart palpitations.  Taking meds as directed w/o problems.  Denies medication side effects.    COPD - Overall she is really done well through the winter. She says spring is usually her more difficult time of the year. She is currently out of her inhaler would like a new prescription just in case she starts to get more cough and wheezing particularly in the evenings which usually happens in the spring.  MDD- On Celexa.  She admits that she's been a little bit more down recently. She really feels like a lot of it's related to her pain with her back. She is really struggling. In fact she follow back up with sports medicine provider today and they have a plan to possibly do a nerve ablation. She really feels like when she can get her pain under better control it will make a difference.  Fibromyalgia - currently on Lyrica. Says right now her back is bothering her more than her fibro.   Semi-she still is not sleeping well. She says the Remeron really has not been helping her. She says most nights she mass will not take anything. She also has tried Ambien in the past and that did not work well either.   Past medical history, Surgical history, Family history not pertinant except as noted below, Social history, Allergies, and medications have been entered into the medical record, reviewed, and corrections made.   Review of Systems: No fevers, chills, night sweats, weight loss, chest pain, or shortness of breath.   Objective:    General: Well Developed, well nourished, and in no acute distress.  Neuro: Alert and oriented x3, extra-ocular muscles intact, sensation grossly intact.  HEENT: Normocephalic, atraumatic  Skin: Warm and dry, no rashes. Cardiac: Regular rate and rhythm, no murmurs rubs or gallops, no lower extremity edema.  Respiratory: Clear to auscultation bilaterally. Not using accessory muscles,  speaking in full sentences.   Impression and Recommendations:    HTN - Well controlled. Continue current regimen. Follow up in  6 months.    Fibromyalgia - Continue with Lyrica.  Depression - PHQ - 9 score of 19 today.  Uncontrolled.  No thoughts of harming her self.  Discussed referring to a therapist or counselor.    Chronic back pain-we'll go ahead and refill her oxycodone today. After completing opioid risk total today. Pain contract on file is from September 2017. Refill oxycodone today. Opiod Rks Tool Score of 1.   Insomnia - Remeron, Ambien ineffective. We'll try Lunesta 2 mg daily. F/U in 3 months

## 2016-07-13 NOTE — Progress Notes (Signed)
Annette Ramirez is a 71 y.o. female who presents to [facility] today for back pain.   Patient was seen previously for chronic back pain. She had a diagnostic and therapeutic right L4-L5 facet injection in late December 2017. During the procedure she had reproduction of pain and had temporary relief of pain while the facet was numb. She notes the injection did not provide any lasting relief at all. She notes chronic daily pain and wishes to have better pain control possible.   Past Medical History:  Diagnosis Date  . Anxiety   . Asthma   . COPD (chronic obstructive pulmonary disease) ([place])   . Depression   . Exertional shortness of breath   . Family history of anesthesia complication    "granddaughter has PONV" (02/28/2013)  . Fibromyalgia   . GERD (gastroesophageal reflux disease)   . H/O hiatal hernia   . Hypertension   . Migraines    "in the past; they've stopped" (02/28/2013)  . Osteoporosis   . PONV (postoperative nausea and vomiting)   . Sinus headache    Past Surgical History:  Procedure Laterality Date  . ABDOMINAL HYSTERECTOMY  1975  . APPENDECTOMY  1975  . BREAST CYST ASPIRATION Left 1980's  . COLONOSCOPY W/ BIOPSIES AND POLYPECTOMY  2012  . DILATION AND CURETTAGE OF UTERUS  1960's  . EXCISIONAL HEMORRHOIDECTOMY  1980's  . LUMBAR LAMINECTOMY  02/28/2013  . LUMBAR LAMINECTOMY/DECOMPRESSION MICRODISCECTOMY N/A 02/28/2013   Procedure: LUMBAR LAMINECTOMY/DECOMPRESSION MICRODISCECTOMY/Lumbar 4-5 decompression;  Surgeon: Sinclair Ship, MD;  Location: [facility];  Service: Orthopedics;  Laterality: N/A;  Lumbar 4-5 decompression  . TONSILLECTOMY  1950  . TUBAL LIGATION  1972   Social History  Substance Use Topics  . Smoking status: Current Some Day Smoker    Packs/day: 0.25    Years: 40.00    Types: Cigarettes  . Smokeless tobacco: Never Used     Comment: 02/28/2013 "started smoking eletronic cigarettes a couple  years ago"  . Alcohol use 3.0 oz/week    5 Glasses of wine per week     ROS:  As above   Medications: Current Outpatient Prescriptions  Medication Sig Dispense Refill  . albuterol (PROVENTIL HFA;VENTOLIN HFA) 108 (90 BASE) MCG/ACT inhaler Inhale 2 puffs into the lungs every 6 (six) hours as needed for wheezing. 18 g 5  . omeprazole (PRILOSEC) 40 MG capsule TAKE 1 CAPSULE BY MOUTH  DAILY 90 capsule 4  . VESICARE 5 MG tablet TAKE 1 TABLET BY MOUTH  DAILY 30 tablet 11  . atenolol (TENORMIN) 100 MG tablet Take 1 tablet (100 mg total) by mouth 2 (two) times daily. 180 tablet 1  . atorvastatin (LIPITOR) 20 MG tablet Take 1 tablet (20 mg total) by mouth daily. 90 tablet 3  . citalopram (CELEXA) 20 MG tablet Take 1 tablet (20 mg total) by mouth daily. 90 tablet 1  . eszopiclone (LUNESTA) 2 MG TABS tablet Take 1 tablet (2 mg total) by mouth at bedtime as needed for sleep. Take immediately before bedtime 30 tablet 2  . losartan-hydrochlorothiazide (HYZAAR) 100-12.5 MG tablet TAKE ONE TABLET BY MOUTH EVERY DAY 90 tablet 1  . oxyCODONE-acetaminophen (ROXICET) 5-325 MG tablet Take 1 tablet by mouth every 6 (six) hours as needed for severe pain. 60 tablet 0   No current facility-administered medications for this visit.    Allergies  Allergen Reactions  . Amoxicillin-Pot Clavulanate     REACTION: nausea and abdominal pain  .  Aspirin     REACTION: upset stomach  . Codeine     REACTION: nausea     Exam:  BP 131/67   Pulse 75   Wt 157 lb (71.2 kg)   BMI 28.72 kg/m  General: Well Developed, well nourished, and in no acute distress.  Neuro/Psych: Alert and oriented x3, extra-ocular muscles intact, able to move all 4 extremities, sensation grossly intact. Skin: Warm and dry, no rashes noted.  Respiratory: Not using accessory muscles, speaking in full sentences, trachea midline.  Cardiovascular: Pulses palpable, no extremity edema. Abdomen: Does not appear distended. MSK: L spine: Nontender  to midline. Impaired motion.    No results found for this or any previous visit (from the past 48 hour(s)). No results found.    Assessment and Plan: 71 y.o. female with chronic back pain very likely due to right L4-L5 facet DJD.  Based on response during injection I think the primary pain generator in her low back is likely this facet joint. Plan for medial branch block and preparation for medial nerve ablation. Follow-up following procedure.    No orders of the defined types were placed in this encounter.   Discussed warning signs or symptoms. Please see discharge instructions. Patient expresses understanding.

## 2016-07-15 ENCOUNTER — Telehealth: Payer: Self-pay | Admitting: *Deleted

## 2016-07-15 NOTE — Progress Notes (Signed)
Ordered and demographics faxed to [facility] at [phone].    

## 2016-07-15 NOTE — Telephone Encounter (Signed)
Pre Authorization sent to cover my meds. [id number]

## 2016-07-20 ENCOUNTER — Other Ambulatory Visit: Payer: Self-pay | Admitting: Family Medicine

## 2016-07-20 DIAGNOSIS — M47816 Spondylosis without myelopathy or radiculopathy, lumbar region: Secondary | ICD-10-CM

## 2016-07-20 DIAGNOSIS — M545 Low back pain, unspecified: Secondary | ICD-10-CM

## 2016-07-20 DIAGNOSIS — G8929 Other chronic pain: Secondary | ICD-10-CM

## 2016-07-22 NOTE — Telephone Encounter (Signed)
Call pt and see if she would be willing to try one of the other drugs.  I think she might do well on Belsomra.

## 2016-07-22 NOTE — Telephone Encounter (Signed)
eszopiclone has been denied by the insurance. They want her to have tried one of the following: Belsomra, Rozerem, Trazodone, Zaleplon

## 2016-07-23 MED ORDER — SUVOREXANT 15 MG PO TABS: 15.0000 mg | ORAL_TABLET | Freq: Every day | ORAL | 0 refills | Status: DC

## 2016-07-23 NOTE — Telephone Encounter (Signed)
Pt stated that she would be willing to try the [place] . Will fwd to pcp.Audelia Hives [place]

## 2016-07-23 NOTE — Telephone Encounter (Signed)
Called and informed pt of recommendations. She stated that she has two tablets left on what she currently takes for sleep. Maryruth Eve, Lahoma Crocker

## 2016-07-23 NOTE — Telephone Encounter (Signed)
OK, will send over Belsomra 15mg .  We can always adjust dose if needed. She needs to have a one week wash period for all sleep aids before starting belsomra  Beatrice Lecher, MD

## 2016-07-30 ENCOUNTER — Ambulatory Visit
Admission: RE | Admit: 2016-07-30 | Discharge: 2016-07-30 | Disposition: A | Discharge status: Home / Self Care | Payer: Medicare Other | Source: Ambulatory Visit | Attending: Family Medicine | Admitting: Family Medicine

## 2016-07-30 ENCOUNTER — Other Ambulatory Visit: Payer: Self-pay | Admitting: Family Medicine

## 2016-07-30 DIAGNOSIS — G8929 Other chronic pain: Secondary | ICD-10-CM

## 2016-07-30 DIAGNOSIS — M47816 Spondylosis without myelopathy or radiculopathy, lumbar region: Secondary | ICD-10-CM

## 2016-07-30 DIAGNOSIS — M545 Low back pain, unspecified: Secondary | ICD-10-CM

## 2016-08-03 ENCOUNTER — Ambulatory Visit
Admission: RE | Admit: 2016-08-03 | Discharge: 2016-08-03 | Disposition: A | Discharge status: Home / Self Care | Payer: Medicare Other | Source: Ambulatory Visit | Attending: Family Medicine | Admitting: Family Medicine

## 2016-08-03 ENCOUNTER — Other Ambulatory Visit: Payer: Self-pay | Admitting: Family Medicine

## 2016-08-03 VITALS — BP 133/76 | HR 64 | Temp 97.5°F | Resp 14

## 2016-08-03 DIAGNOSIS — M47816 Spondylosis without myelopathy or radiculopathy, lumbar region: Secondary | ICD-10-CM

## 2016-08-03 DIAGNOSIS — M545 Low back pain, unspecified: Secondary | ICD-10-CM

## 2016-08-03 DIAGNOSIS — G8929 Other chronic pain: Secondary | ICD-10-CM

## 2016-08-03 MED ORDER — MIDAZOLAM HCL 2 MG/2ML IJ SOLN
1.0000 mg | INTRAMUSCULAR | Status: DC | PRN
  Administered 2016-08-03: 0.5 mg via INTRAVENOUS

## 2016-08-03 MED ORDER — SODIUM CHLORIDE 0.9 % IV SOLN
Freq: Once | INTRAVENOUS | Status: AC
  Administered 2016-08-03: 08:00:00 via INTRAVENOUS

## 2016-08-03 MED ORDER — KETOROLAC TROMETHAMINE 30 MG/ML IJ SOLN: 30.0000 mg | Freq: Once | INTRAMUSCULAR | Status: DC

## 2016-08-03 MED ORDER — FENTANYL CITRATE (PF) 100 MCG/2ML IJ SOLN: 25.0000 ug | INTRAMUSCULAR | Status: DC | PRN

## 2016-08-03 MED ORDER — METHYLPREDNISOLONE ACETATE 40 MG/ML INJ SUSP (RADIOLOG: 80.0000 mg | Freq: Once | INTRAMUSCULAR | Status: DC

## 2016-08-03 NOTE — Discharge Instructions (Signed)
[facility] Abl[facility] Post Procedure Discharge Instructions ° °1. May resume a regular diet and any medications that you routinely take (including pain medications). °2. No driving day of procedure. °3. Upon discharge go home and rest for at least 4 hours.  May use an ice pack as needed to injection sites on back. °4. Remove bandades later, today. ° ° ° °Please contact our office at [phone] for the following symptoms: ° °· Fever greater than 100 degrees °· Increased swelling, pain, or redness at injection site. ° ° °Thank you for visiting [facility]. °

## 2016-08-09 DIAGNOSIS — I10 Essential (primary) hypertension: Secondary | ICD-10-CM | POA: Diagnosis not present

## 2016-08-09 LAB — LIPID PANEL W/REFLEX DIRECT LDL
CHOL/HDL RATIO: 1.6 ratio (ref <5.0)
Cholesterol: 160 mg/dL (ref <200)
HDL: 97 mg/dL (ref >50)
LDL-CHOLESTEROL: 43 mg/dL
NON-HDL CHOLESTEROL (CALC): 63 mg/dL (ref <130)
TRIGLYCERIDES: 123 mg/dL (ref <150)

## 2016-08-09 LAB — COMPLETE METABOLIC PANEL WITH GFR
ALT: 19 U/L (ref 6–29)
AST: 27 U/L (ref 10–35)
Albumin: 4.9 g/dL (ref 3.6–5.1)
Alkaline Phosphatase: 62 U/L (ref 33–130)
BILIRUBIN TOTAL: 0.8 mg/dL (ref 0.2–1.2)
BUN: 21 mg/dL (ref 7–25)
CO2: 30 mmol/L (ref 20–31)
Calcium: 9.3 mg/dL (ref 8.6–10.4)
Chloride: 93 mmol/L — ABNORMAL LOW (ref 98–110)
Creat: 1.04 mg/dL — ABNORMAL HIGH (ref 0.60–0.93)
GFR, EST AFRICAN AMERICAN: 62 mL/min (ref >=60)
GFR, EST NON AFRICAN AMERICAN: 53 mL/min — AB (ref >=60)
GLUCOSE: 90 mg/dL (ref 65–99)
POTASSIUM: 4.9 mmol/L (ref 3.5–5.3)
SODIUM: 133 mmol/L — AB (ref 135–146)
TOTAL PROTEIN: 7.3 g/dL (ref 6.1–8.1)

## 2016-08-10 ENCOUNTER — Other Ambulatory Visit: Payer: Self-pay

## 2016-08-10 DIAGNOSIS — E871 Hypo-osmolality and hyponatremia: Secondary | ICD-10-CM

## 2016-08-20 ENCOUNTER — Telehealth: Payer: Self-pay

## 2016-08-20 NOTE — Telephone Encounter (Signed)
Annette Ramirez left a message and states the sleep medication is not helping. I called patient, no answer, unable to leave a message, no voice mail.

## 2016-08-23 MED ORDER — SUVOREXANT 20 MG PO TABS: 20.0000 mg | ORAL_TABLET | Freq: Every day | ORAL | 0 refills | Status: DC

## 2016-08-23 NOTE — Telephone Encounter (Signed)
Ok, new rx sent for Belsomra 20mg .  Make sure avoiding all caffine even in morning. Make sure regulated bedtime and awake time.  Encourage regular exercise.

## 2016-08-25 DIAGNOSIS — K591 Functional diarrhea: Secondary | ICD-10-CM | POA: Diagnosis not present

## 2016-08-25 MED ORDER — LOSARTAN POTASSIUM-HCTZ 100-12.5 MG PO TABS: ORAL_TABLET | ORAL | 1 refills | Status: DC

## 2016-08-25 MED ORDER — OMEPRAZOLE 40 MG PO CPDR: 40.0000 mg | DELAYED_RELEASE_CAPSULE | Freq: Every day | ORAL | 3 refills | Status: DC

## 2016-08-25 MED ORDER — ATORVASTATIN CALCIUM 20 MG PO TABS: 20.0000 mg | ORAL_TABLET | Freq: Every day | ORAL | 3 refills | Status: DC

## 2016-08-25 MED ORDER — CITALOPRAM HYDROBROMIDE 20 MG PO TABS: 20.0000 mg | ORAL_TABLET | Freq: Every day | ORAL | 1 refills | Status: DC

## 2016-08-25 MED ORDER — ALBUTEROL SULFATE HFA 108 (90 BASE) MCG/ACT IN AERS: 2.0000 | INHALATION_SPRAY | Freq: Four times a day (QID) | RESPIRATORY_TRACT | 3 refills | Status: DC | PRN

## 2016-08-25 MED ORDER — SOLIFENACIN SUCCINATE 5 MG PO TABS: 5.0000 mg | ORAL_TABLET | Freq: Every day | ORAL | 3 refills | Status: DC

## 2016-08-25 MED ORDER — ATENOLOL 100 MG PO TABS: 100.0000 mg | ORAL_TABLET | Freq: Two times a day (BID) | ORAL | 1 refills | Status: DC

## 2016-08-25 NOTE — Telephone Encounter (Signed)
Annette Ramirez states the Belsomra was not effective. After talking with [name] for a few minutes we realized she never picked up or tried the [place]. She is going to pick it up and see how it works.

## 2016-09-01 ENCOUNTER — Telehealth: Payer: Self-pay

## 2016-09-01 MED ORDER — SERTRALINE HCL 50 MG PO TABS: ORAL_TABLET | ORAL | 3 refills | Status: DC

## 2016-09-01 NOTE — Telephone Encounter (Signed)
Pt called, and was asking about sleep aid again.  She said Belsomra does not work.  The Johnnye Sima was not authorized.  Pt stated that zoloft has helped her previously.

## 2016-09-01 NOTE — Telephone Encounter (Signed)
Pt given recommendations.Audelia Hives [place] \

## 2016-09-01 NOTE — Telephone Encounter (Signed)
Annette Ramirez, since Annette Ramirez was denied by her insurance we can try the Zoloft but she will need to decrease her citalopram. She can go ahead and start cutting back and half and then start the Zoloft at bedtime. After one week she can take a quarter of the citalopram or even stop it.

## 2016-09-01 NOTE — Telephone Encounter (Signed)
Notified patient and she verbalized understanding.

## 2016-09-07 ENCOUNTER — Telehealth: Payer: Self-pay

## 2016-09-07 NOTE — Telephone Encounter (Signed)
Pt is requesting a refill of oxycodone, if appropriate I will fill and place in your box for signature.  Please advise.

## 2016-09-07 NOTE — Telephone Encounter (Signed)
Ok to fill 

## 2016-09-08 ENCOUNTER — Other Ambulatory Visit: Payer: Self-pay | Admitting: *Deleted

## 2016-09-08 MED ORDER — OXYCODONE-ACETAMINOPHEN 5-325 MG PO TABS: 1.0000 | ORAL_TABLET | Freq: Four times a day (QID) | ORAL | 0 refills | Status: DC | PRN

## 2016-09-08 NOTE — Progress Notes (Signed)
Refill given and handicap placard completed and given to Annette Ramirez, Annette Ramirez

## 2016-09-08 NOTE — Telephone Encounter (Signed)
rx filled and placed up front.Annette Ramirez

## 2016-10-14 ENCOUNTER — Ambulatory Visit (INDEPENDENT_AMBULATORY_CARE_PROVIDER_SITE_OTHER): Payer: Medicare Other | Admitting: Family Medicine

## 2016-10-14 ENCOUNTER — Telehealth: Payer: Self-pay | Admitting: *Deleted

## 2016-10-14 ENCOUNTER — Encounter: Payer: Self-pay | Admitting: Family Medicine

## 2016-10-14 VITALS — BP 131/79 | HR 67 | Ht 62.0 in | Wt 147.0 lb

## 2016-10-14 DIAGNOSIS — K219 Gastro-esophageal reflux disease without esophagitis: Secondary | ICD-10-CM

## 2016-10-14 DIAGNOSIS — R112 Nausea with vomiting, unspecified: Secondary | ICD-10-CM | POA: Diagnosis not present

## 2016-10-14 DIAGNOSIS — R1110 Vomiting, unspecified: Secondary | ICD-10-CM | POA: Insufficient documentation

## 2016-10-14 DIAGNOSIS — Z1329 Encounter for screening for other suspected endocrine disorder: Secondary | ICD-10-CM | POA: Diagnosis not present

## 2016-10-14 LAB — TSH: TSH: 2.75 mIU/L

## 2016-10-14 LAB — T3, FREE: T3, Free: 2.8 pg/mL (ref 2.3–4.2)

## 2016-10-14 LAB — CBC
HCT: 38.1 % (ref 35.0–45.0)
Hemoglobin: 13.2 g/dL (ref 11.7–15.5)
MCH: 33.2 pg — ABNORMAL HIGH (ref 27.0–33.0)
MCHC: 34.6 g/dL (ref 32.0–36.0)
MCV: 96 fL (ref 80.0–100.0)
MPV: 10.2 fL (ref 7.5–12.5)
PLATELETS: 260 10*3/uL (ref 140–400)
RBC: 3.97 MIL/uL (ref 3.80–5.10)
RDW: 13 % (ref 11.0–15.0)
WBC: 7.5 10*3/uL (ref 3.8–10.8)

## 2016-10-14 LAB — T4, FREE: FREE T4: 1.3 ng/dL (ref 0.8–1.8)

## 2016-10-14 MED ORDER — METOCLOPRAMIDE HCL 10 MG PO TABS: 5.0000 mg | ORAL_TABLET | Freq: Four times a day (QID) | ORAL | 0 refills | Status: DC | PRN

## 2016-10-14 NOTE — Progress Notes (Signed)
Annette Ramirez is a 71 y.o. female who presents to [facility] today for persistent nausea and vomiting of two weeks duration.    Nausea is constant through the day and keeps her up at night, symptoms worsening over the last two weeks. It is not worse upon awakening and does not improve with hot showers. It doesn't change with what she eats. She hasn't had anything like this in the past. This doesn't feel like the gastric ulcer she had before. She endorses loss of appetite, feeling unbalanced, unintentional weight loss. She denies headache, abdominal pain, fever.  She has chronic diarrhea that was evaluated two month ago with endo and colonoscopy. she states they were normal and the GI suggested functional bowel diseased and prescribed imodium.  She also increased her dose of sertraline to 50 mg at the time she started to experience symptoms.    Past Medical History:  Diagnosis Date  . Anxiety   . Asthma   . COPD (chronic obstructive pulmonary disease) ([place])   . Depression   . Exertional shortness of breath   . Family history of anesthesia complication    "granddaughter has PONV" (02/28/2013)  . Fibromyalgia   . GERD (gastroesophageal reflux disease)   . H/O hiatal hernia   . Hypertension   . Migraines    "in the past; they've stopped" (02/28/2013)  . Osteoporosis   . PONV (postoperative nausea and vomiting)   . Sinus headache    Past Surgical History:  Procedure Laterality Date  . ABDOMINAL HYSTERECTOMY  1975  . APPENDECTOMY  1975  . BREAST CYST ASPIRATION Left 1980's  . COLONOSCOPY W/ BIOPSIES AND POLYPECTOMY  2012  . DILATION AND CURETTAGE OF UTERUS  1960's  . EXCISIONAL HEMORRHOIDECTOMY  1980's  . LUMBAR LAMINECTOMY  02/28/2013  . LUMBAR LAMINECTOMY/DECOMPRESSION MICRODISCECTOMY N/A 02/28/2013   Procedure: LUMBAR LAMINECTOMY/DECOMPRESSION  MICRODISCECTOMY/Lumbar 4-5 decompression;  Surgeon: Sinclair Ship, MD;  Location: [facility];  Service: Orthopedics;  Laterality: N/A;  Lumbar 4-5 decompression  . TONSILLECTOMY  1950  . TUBAL LIGATION  1972   Social History  Substance Use Topics  . Smoking status: Current Some Day Smoker    Packs/day: 0.25    Years: 40.00    Types: Cigarettes  . Smokeless tobacco: Never Used     Comment: 02/28/2013 "started smoking eletronic cigarettes a couple years ago"  . Alcohol use 3.0 oz/week    5 Glasses of wine per week   family history includes Alzheimer's disease in her mother.  ROS as above:  Medications: Current Outpatient Prescriptions  Medication Sig Dispense Refill  . albuterol (PROVENTIL HFA;VENTOLIN HFA) 108 (90 Base) MCG/ACT inhaler Inhale 2 puffs into the lungs every 6 (six) hours as needed for wheezing. 54 g 3  . atenolol (TENORMIN) 100 MG tablet Take 1 tablet (100 mg total) by mouth 2 (two) times daily. 180 tablet 1  . atorvastatin (LIPITOR) 20 MG tablet Take 1 tablet (20 mg total) by mouth daily. 90 tablet 3  . eszopiclone (LUNESTA) 2 MG TABS tablet Take 1 tablet (2 mg total) by mouth at bedtime as needed for sleep. Take immediately before bedtime 30 tablet 2  . losartan-hydrochlorothiazide (HYZAAR) 100-12.5 MG tablet TAKE ONE TABLET BY MOUTH EVERY DAY 90 tablet 1  . omeprazole (PRILOSEC) 40 MG capsule Take 1 capsule (40 mg total) by mouth daily. 90 capsule 3  . oxyCODONE-acetaminophen (ROXICET) 5-325 MG tablet Take 1 tablet by  mouth every 6 (six) hours as needed for severe pain. 60 tablet 0  . sertraline (ZOLOFT) 50 MG tablet 1/2 tab po QHS x 5 days and then increase to whole tab daily 30 tablet 3  . solifenacin (VESICARE) 5 MG tablet Take 1 tablet (5 mg total) by mouth daily. 90 tablet 3  . metoCLOPramide (REGLAN) 10 MG tablet Take 0.5 tablets (5 mg total) by mouth every 6 (six) hours as needed for nausea (nausea/reflux). 120 tablet 0   No current facility-administered  medications for this visit.    Allergies  Allergen Reactions  . Amoxicillin-Pot Clavulanate     REACTION: nausea and abdominal pain  . Aspirin     REACTION: upset stomach  . Codeine     REACTION: nausea    Health Maintenance Health Maintenance  Topic Date Due  . INFLUENZA VACCINE  12/01/2016  . MAMMOGRAM  04/05/2018  . COLONOSCOPY  10/03/2018  . TETANUS/TDAP  05/12/2025  . Hepatitis C Screening  Completed  . PNA vac Low Risk Adult  Completed     Exam:  BP 131/79   Pulse 67   Ht 5\' 2"  (1.575 m)   Wt 147 lb (66.7 kg)   BMI 26.89 kg/m  Gen: Well NAD nontoxic appearing HEENT: EOMI,  MMM Lungs: Normal work of breathing. Slight wheezing Heart: RRR no MRG Abd: NABS, Soft. Nondistended, Nontender Exts: Brisk capillary refill, warm and well perfused. Normal skin turgor.    No results found for this or any previous visit (from the past 72 hour(s)). No results found.    Assessment and Plan: 71 y.o. female with persistent nausea and vomiting for two weeks.  Given the timeline of increasing her SSRI dose at the time of symptom onset, I think this is likely contributing to her symptoms.  I would like to try decreasing the dose again to 25 mg to see if nausea improves. I'd like to check TSH and thyroid hormones with history of insomnia and shortened bowel transit time. This could be related to functional bowel disease given history of likely IBS-D and normal endoscopy and colonoscopy. She was hyponatremic hypochloremic 2 months ago and is likely worsening with continuous vomiting, plan to recheck CMP. She is on a thiazide diuretic and may have signs of SIADH with SSRI use.  Start metoclopramide for nausea symptom treatment.  Patient is not febrile nor acutely ill and well hydrated, will recheck next week for possible further workup.   Orders Placed This Encounter  Procedures  . CBC  . COMPLETE METABOLIC PANEL WITH GFR  . TSH  . T4, free  . T3, free   Meds ordered this  encounter  Medications  . metoCLOPramide (REGLAN) 10 MG tablet    Sig: Take 0.5 tablets (5 mg total) by mouth every 6 (six) hours as needed for nausea (nausea/reflux).    Dispense:  120 tablet    Refill:  0     Discussed warning signs or symptoms. Please see discharge instructions. Patient expresses understanding.

## 2016-10-14 NOTE — Telephone Encounter (Signed)
Pharmacy called wanting to verify that you still want pt to get Reglan filled because there is an interaction with her Citalopram.  She didn't specify what the interaction is.  Please advise.

## 2016-10-14 NOTE — Patient Instructions (Signed)
Thank you for coming in today. Get blood work today.  Cut zoloft in 1/2 (25mg  daily).  Take metoclopramide up to 4 times daily as needed for nausea or vomiting. Recheck with Dr. Charise Carwin or myself next week  If your belly pain worsens, or you have high fever, bad vomiting, blood in your stool or black tarry stool go to the Emergency Room.    Nausea and Vomiting, Adult Nausea is the feeling that you have an upset stomach or have to vomit. As nausea gets worse, it can lead to vomiting. Vomiting occurs when stomach contents are thrown up and out of the mouth. Vomiting can make you feel weak and cause you to become dehydrated. Dehydration can make you tired and thirsty, cause you to have a dry mouth, and decrease how often you urinate. Older adults and people with other diseases or a weak immune system are at higher risk for dehydration. It is important to treat your nausea and vomiting as told by your health care provider. Follow these instructions at home: Follow instructions from your health care provider about how to care for yourself at home. Eating and drinking Follow these recommendations as told by your health care provider:  Take an oral rehydration solution (ORS). This is a drink that is sold at pharmacies and retail stores.  Drink clear fluids in small amounts as you are able. Clear fluids include water, ice chips, diluted fruit juice, and low-calorie sports drinks.  Eat bland, easy-to-digest foods in small amounts as you are able. These foods include bananas, applesauce, rice, lean meats, toast, and crackers.  Avoid fluids that contain a lot of sugar or caffeine, such as energy drinks, sports drinks, and soda.  Avoid alcohol.  Avoid spicy or fatty foods.  General instructions  Drink enough fluid to keep your urine clear or pale yellow.  Wash your hands often. If soap and water are not available, use hand sanitizer.  Make sure that all people in your household wash their hands  well and often.  Take over-the-counter and prescription medicines only as told by your health care provider.  Rest at home while you recover.  Watch your condition for any changes.  Breathe slowly and deeply when you feel nauseated.  Keep all follow-up visits as told by your health care provider. This is important. Contact a health care provider if:  You have a fever.  You cannot keep fluids down.  Your symptoms get worse.  You have new symptoms.  Your nausea does not go away after two days.  You feel light-headed or dizzy.  You have a headache.  You have muscle cramps. Get help right away if:  You have pain in your chest, neck, arm, or jaw.  You feel extremely weak or you faint.  You have persistent vomiting.  You see blood in your vomit.  Your vomit looks like black coffee grounds.  You have bloody or black stools or stools that look like tar.  You have a severe headache, a stiff neck, or both.  You have a rash.  You have severe pain, cramping, or bloating in your abdomen.  You have trouble breathing or you are breathing very quickly.  Your heart is beating very quickly.  Your skin feels cold and clammy.  You feel confused.  You have pain when you urinate.  You have signs of dehydration, such as: ? Dark urine, very little urine, or no urine. ? Cracked lips. ? Dry mouth. ? Sunken eyes. ? Sleepiness. ?  Weakness. These symptoms may represent a serious problem that is an emergency. Do not wait to see if the symptoms will go away. Get medical help right away. Call your local emergency services (911 in the [place] Do not drive yourself to the hospital. This information is not intended to replace advice given to you by your health care provider. Make sure you discuss any questions you have with your health care provider. Document Released: 04/19/2005 Document Revised: 09/22/2015 Document Reviewed: 12/24/2014 Elsevier Interactive Patient Education  2017  [employer].

## 2016-10-15 LAB — COMPLETE METABOLIC PANEL WITH GFR
ALT: 17 U/L (ref 6–29)
AST: 22 U/L (ref 10–35)
Albumin: 4.6 g/dL (ref 3.6–5.1)
Alkaline Phosphatase: 57 U/L (ref 33–130)
BILIRUBIN TOTAL: 1.2 mg/dL (ref 0.2–1.2)
BUN: 13 mg/dL (ref 7–25)
CHLORIDE: 86 mmol/L — AB (ref 98–110)
CO2: 26 mmol/L (ref 20–31)
CREATININE: 0.79 mg/dL (ref 0.60–0.93)
Calcium: 9.5 mg/dL (ref 8.6–10.4)
GFR, EST AFRICAN AMERICAN: 86 mL/min (ref >=60)
GFR, Est Non African American: 74 mL/min (ref >=60)
GLUCOSE: 105 mg/dL — AB (ref 65–99)
Potassium: 3.8 mmol/L (ref 3.5–5.3)
SODIUM: 126 mmol/L — AB (ref 135–146)
TOTAL PROTEIN: 6.6 g/dL (ref 6.1–8.1)

## 2016-10-15 NOTE — Telephone Encounter (Signed)
I think it is ok at this time.

## 2016-10-18 NOTE — Telephone Encounter (Signed)
Pharmacy notified.

## 2016-10-21 ENCOUNTER — Ambulatory Visit (INDEPENDENT_AMBULATORY_CARE_PROVIDER_SITE_OTHER): Payer: Medicare Other | Admitting: Family Medicine

## 2016-10-21 VITALS — BP 117/74 | HR 68 | Wt 147.0 lb

## 2016-10-21 DIAGNOSIS — R112 Nausea with vomiting, unspecified: Secondary | ICD-10-CM

## 2016-10-21 DIAGNOSIS — E871 Hypo-osmolality and hyponatremia: Secondary | ICD-10-CM | POA: Diagnosis not present

## 2016-10-21 MED ORDER — LOSARTAN POTASSIUM 100 MG PO TABS: 100.0000 mg | ORAL_TABLET | Freq: Every day | ORAL | 0 refills | Status: DC

## 2016-10-21 NOTE — Patient Instructions (Signed)
Thank you for coming in today. STOP Losartan/HCTZ.  Start Losartan by itself.  Get labs today.  Recheck with Dr Madilyn Fireman in 2-3 weeks.  Return sooner if needed.   Hyponatremia Hyponatremia is when the amount of salt (sodium) in your blood is too low. When sodium levels are low, your cells absorb extra water and they swell. The swelling happens throughout the body, but it mostly affects the brain. What are the causes? This condition may be caused by:  Heart, kidney, or liver problems.  Thyroid problems.  Adrenal gland problems.  Metabolic conditions, such as syndrome of inappropriate antidiuretic hormone (SIADH).  Severe vomiting and diarrhea.  Certain medicines or illegal drugs.  Dehydration.  Drinking too much water.  Eating a diet that is low in sodium.  Large burns on your body.  Sweating.  What increases the risk? This condition is more likely to develop in people who:  Have long-term (chronic) kidney disease.  Have heart failure.  Have a medical condition that causes frequent or excessive diarrhea.  Have metabolic conditions, such as Addison disease or SIADH.  Take certain medicines that affect the sodium and fluid balance in the blood. Some of these medicine types include: ? Diuretics. ? NSAIDs. ? Some opioid pain medicines. ? Some antidepressants. ? Some seizure prevention medicines.  What are the signs or symptoms? Symptoms of this condition include:  Nausea and vomiting.  Confusion.  Lethargy.  Agitation.  Headache.  Seizures.  Unconsciousness.  Appetite loss.  Muscle weakness and cramping.  Feeling weak or light-headed.  Having a rapid heart rate.  Fainting, in severe cases.  How is this diagnosed? This condition is diagnosed with a medical history and physical exam. You will also have other tests, including:  Blood tests.  Urine tests.  How is this treated? Treatment for this condition depends on the cause. Treatment  may include:  Fluids given through an IV tube that is inserted into one of your veins.  Medicines to correct the sodium imbalance. If medicines are causing the condition, the medicines will need to be adjusted.  Limiting water or fluid intake to get the correct sodium balance.  Follow these instructions at home:  Take medicines only as directed by your health care provider. Many medicines can make this condition worse. Talk with your health care provider about any medicines that you are currently taking.  Carefully follow a recommended diet as directed by your health care provider.  Carefully follow instructions from your health care provider about fluid restrictions.  Keep all follow-up visits as directed by your health care provider. This is important.  Do not drink alcohol. Contact a health care provider if:  You develop worsening nausea, fatigue, headache, confusion, or weakness.  Your symptoms go away and then return.  You have problems following the recommended diet. Get help right away if:  You have a seizure.  You faint.  You have ongoing diarrhea or vomiting. This information is not intended to replace advice given to you by your health care provider. Make sure you discuss any questions you have with your health care provider. Document Released: 04/09/2002 Document Revised: 09/25/2015 Document Reviewed: 05/09/2014 Elsevier Interactive Patient Education  [employer].

## 2016-10-21 NOTE — Progress Notes (Signed)
Annette Ramirez is a 71 y.o. female who presents to [facility]: [facility] today for nausea and vomiting. Patient has been seen several times for nausea and vomiting. She was seen most recently on June 14 and prescribed Reglan has helped some.  Additionally the last visit she was found to have low sodium. She takes losartan/hydrochlorothiazide. She denies muscle cramps.   Past Medical History:  Diagnosis Date  . Anxiety   . Asthma   . COPD (chronic obstructive pulmonary disease) ([place])   . Depression   . Exertional shortness of breath   . Family history of anesthesia complication    "granddaughter has PONV" (02/28/2013)  . Fibromyalgia   . GERD (gastroesophageal reflux disease)   . H/O hiatal hernia   . Hypertension   . Migraines    "in the past; they've stopped" (02/28/2013)  . Osteoporosis   . PONV (postoperative nausea and vomiting)   . Sinus headache    Past Surgical History:  Procedure Laterality Date  . ABDOMINAL HYSTERECTOMY  1975  . APPENDECTOMY  1975  . BREAST CYST ASPIRATION Left 1980's  . COLONOSCOPY W/ BIOPSIES AND POLYPECTOMY  2012  . DILATION AND CURETTAGE OF UTERUS  1960's  . EXCISIONAL HEMORRHOIDECTOMY  1980's  . LUMBAR LAMINECTOMY  02/28/2013  . LUMBAR LAMINECTOMY/DECOMPRESSION MICRODISCECTOMY N/A 02/28/2013   Procedure: LUMBAR LAMINECTOMY/DECOMPRESSION MICRODISCECTOMY/Lumbar 4-5 decompression;  Surgeon: Sinclair Ship, MD;  Location: [facility];  Service: Orthopedics;  Laterality: N/A;  Lumbar 4-5 decompression  . TONSILLECTOMY  1950  . TUBAL LIGATION  1972   Social History  Substance Use Topics  . Smoking status: Current Some Day Smoker    Packs/day: 0.25    Years: 40.00    Types: Cigarettes  . Smokeless tobacco: Never Used     Comment: 02/28/2013 "started smoking eletronic cigarettes a couple years ago"  . Alcohol use 3.0 oz/week    5  Glasses of wine per week   family history includes Alzheimer's disease in her mother.  ROS as above:  Medications: Current Outpatient Prescriptions  Medication Sig Dispense Refill  . albuterol (PROVENTIL HFA;VENTOLIN HFA) 108 (90 Base) MCG/ACT inhaler Inhale 2 puffs into the lungs every 6 (six) hours as needed for wheezing. 54 g 3  . atenolol (TENORMIN) 100 MG tablet Take 1 tablet (100 mg total) by mouth 2 (two) times daily. 180 tablet 1  . atorvastatin (LIPITOR) 20 MG tablet Take 1 tablet (20 mg total) by mouth daily. 90 tablet 3  . losartan-hydrochlorothiazide (HYZAAR) 100-12.5 MG tablet TAKE ONE TABLET BY MOUTH EVERY DAY 90 tablet 1  . metoCLOPramide (REGLAN) 10 MG tablet Take 0.5 tablets (5 mg total) by mouth every 6 (six) hours as needed for nausea (nausea/reflux). 120 tablet 0  . omeprazole (PRILOSEC) 40 MG capsule Take 1 capsule (40 mg total) by mouth daily. 90 capsule 3  . oxyCODONE-acetaminophen (ROXICET) 5-325 MG tablet Take 1 tablet by mouth every 6 (six) hours as needed for severe pain. 60 tablet 0  . sertraline (ZOLOFT) 50 MG tablet 1/2 tab po QHS x 5 days and then increase to whole tab daily 30 tablet 3  . solifenacin (VESICARE) 5 MG tablet Take 1 tablet (5 mg total) by mouth daily. 90 tablet 3   No current facility-administered medications for this visit.    Allergies  Allergen Reactions  . Amoxicillin-Pot Clavulanate     REACTION: nausea and abdominal pain  . Aspirin  REACTION: upset stomach  . Codeine     REACTION: nausea    Health Maintenance Health Maintenance  Topic Date Due  . INFLUENZA VACCINE  12/01/2016  . MAMMOGRAM  04/05/2018  . COLONOSCOPY  10/03/2018  . TETANUS/TDAP  05/12/2025  . Hepatitis C Screening  Completed  . PNA vac Low Risk Adult  Completed     Exam:  BP 117/74   Pulse 68   Wt 147 lb (66.7 kg)   BMI 26.89 kg/m  Gen: Well NAD HEENT: EOMI,  MMM Lungs: Normal work of breathing. CTABL Heart: RRR no MRG Abd: NABS, Soft.  Nondistended, Nontender Exts: Brisk capillary refill, warm and well perfused.    No results found for this or any previous visit (from the past 72 hour(s)). No results found.    Assessment and Plan: 71 y.o. female with Nausea and vomiting. Unclear etiology. Suspect functional bowel disease. Plan to continue Reglan and follow-up with PCP.  Low-sodium: Will check serum and urine electrolytes today to calculate fractional excretion of sodium. Plan to discontinue hydrochlorothiazide component of losartan/hydrochlorothiazide and follow-up with PCP in 2-3 weeks.   No orders of the defined types were placed in this encounter.  No orders of the defined types were placed in this encounter.    Discussed warning signs or symptoms. Please see discharge instructions. Patient expresses understanding.

## 2016-10-22 LAB — COMPLETE METABOLIC PANEL WITH GFR
ALK PHOS: 61 U/L (ref 33–130)
ALT: 14 U/L (ref 6–29)
AST: 20 U/L (ref 10–35)
Albumin: 4.5 g/dL (ref 3.6–5.1)
BUN: 11 mg/dL (ref 7–25)
CALCIUM: 9.4 mg/dL (ref 8.6–10.4)
CO2: 26 mmol/L (ref 20–31)
CREATININE: 0.78 mg/dL (ref 0.60–0.93)
Chloride: 93 mmol/L — ABNORMAL LOW (ref 98–110)
GFR, EST AFRICAN AMERICAN: 87 mL/min (ref >=60)
GFR, EST NON AFRICAN AMERICAN: 76 mL/min (ref >=60)
Glucose, Bld: 97 mg/dL (ref 65–99)
Potassium: 4.5 mmol/L (ref 3.5–5.3)
Sodium: 132 mmol/L — ABNORMAL LOW (ref 135–146)
TOTAL PROTEIN: 6.5 g/dL (ref 6.1–8.1)
Total Bilirubin: 0.9 mg/dL (ref 0.2–1.2)

## 2016-10-22 LAB — SODIUM, URINE, RANDOM: Sodium, Ur: 41 mmol/L (ref 28–272)

## 2016-10-22 LAB — CREATININE, URINE, RANDOM: Creatinine, Urine: 111 mg/dL (ref 20–320)

## 2016-11-02 ENCOUNTER — Telehealth: Payer: Self-pay | Admitting: *Deleted

## 2016-11-02 MED ORDER — OXYCODONE-ACETAMINOPHEN 5-325 MG PO TABS: 1.0000 | ORAL_TABLET | Freq: Four times a day (QID) | ORAL | 0 refills | Status: DC | PRN

## 2016-11-02 NOTE — Telephone Encounter (Signed)
Called pt back spoke w/husband and asked him to inform her of rx being ready and to remind her that she MUST keep coming appt .Annette Ramirez

## 2016-11-02 NOTE — Telephone Encounter (Signed)
Pt called for refill of oxycodone. Pt checked in [place] Controlled substance reporting system. [facility] for refill.Audelia Hives Maili

## 2016-11-11 ENCOUNTER — Encounter: Payer: Self-pay | Admitting: Family Medicine

## 2016-11-11 ENCOUNTER — Ambulatory Visit (INDEPENDENT_AMBULATORY_CARE_PROVIDER_SITE_OTHER): Payer: Medicare Other | Admitting: Family Medicine

## 2016-11-11 VITALS — BP 138/69 | HR 72 | Ht 62.0 in | Wt 147.0 lb

## 2016-11-11 DIAGNOSIS — R112 Nausea with vomiting, unspecified: Secondary | ICD-10-CM | POA: Diagnosis not present

## 2016-11-11 DIAGNOSIS — I10 Essential (primary) hypertension: Secondary | ICD-10-CM

## 2016-11-11 DIAGNOSIS — M5431 Sciatica, right side: Secondary | ICD-10-CM | POA: Diagnosis not present

## 2016-11-11 DIAGNOSIS — F5101 Primary insomnia: Secondary | ICD-10-CM

## 2016-11-11 DIAGNOSIS — E871 Hypo-osmolality and hyponatremia: Secondary | ICD-10-CM

## 2016-11-11 MED ORDER — PREDNISONE 20 MG PO TABS: 40.0000 mg | ORAL_TABLET | Freq: Every day | ORAL | 0 refills | Status: DC

## 2016-11-11 MED ORDER — TRAZODONE HCL 50 MG PO TABS
25.0000 mg | ORAL_TABLET | Freq: Every evening | ORAL | 3 refills | Status: DC | PRN
Start: 2016-11-11 — End: 2017-03-01

## 2016-11-11 NOTE — Progress Notes (Signed)
Subjective:    Patient ID: Annette Ramirez, female    DOB: [birth year], 71 y.o.   MRN: [id number]  HPI  71 year old female comes in today to follow-up on recent vomiting and hyponatremia. She was recently seen by one of my partners for both of these issues. There was no cause found for the vomiting. Workup was initiated for the hyponatremia and he had recommended stopping the hydrochlorothiazide component of her blood pressure medication. Says took some gas relief and that helped her burp and that helped her nausea.  He is not having any actual abdominal pain.  Not able to sleep off of her xanax. Because she's on chronic pain medications we decided to wean her Xanax. It's been very difficult to sleep without it.  She also reports that her sciatica has flared again. She says it hasn't happened in a long time until about a week or so ago it started to really bother her now it's going down her right buttock cheek and into the posterior thigh. It's not going past the knee.  Review of Systems   BP 138/69   Pulse 72   Ht 5\' 2"  (1.575 m)   Wt 147 lb (66.7 kg)   BMI 26.89 kg/m     Allergies  Allergen Reactions  . Amoxicillin-Pot Clavulanate     REACTION: nausea and abdominal pain  . Aspirin     REACTION: upset stomach  . Codeine     REACTION: nausea    Past Medical History:  Diagnosis Date  . Anxiety   . Asthma   . COPD (chronic obstructive pulmonary disease) ([place])   . Depression   . Exertional shortness of breath   . Family history of anesthesia complication    "granddaughter has PONV" (02/28/2013)  . Fibromyalgia   . GERD (gastroesophageal reflux disease)   . H/O hiatal hernia   . Hypertension   . Migraines    "in the past; they've stopped" (02/28/2013)  . Osteoporosis   . PONV (postoperative nausea and vomiting)   . Sinus headache     Past Surgical History:  Procedure Laterality Date  . ABDOMINAL HYSTERECTOMY  1975  . APPENDECTOMY  1975  . BREAST CYST ASPIRATION  Left 1980's  . COLONOSCOPY W/ BIOPSIES AND POLYPECTOMY  2012  . DILATION AND CURETTAGE OF UTERUS  1960's  . EXCISIONAL HEMORRHOIDECTOMY  1980's  . LUMBAR LAMINECTOMY  02/28/2013  . LUMBAR LAMINECTOMY/DECOMPRESSION MICRODISCECTOMY N/A 02/28/2013   Procedure: LUMBAR LAMINECTOMY/DECOMPRESSION MICRODISCECTOMY/Lumbar 4-5 decompression;  Surgeon: Sinclair Ship, MD;  Location: [facility];  Service: Orthopedics;  Laterality: N/A;  Lumbar 4-5 decompression  . TONSILLECTOMY  1950  . TUBAL LIGATION  1972    Social History   Social History  . Marital status: Married    Spouse name: N/A  . Number of children: N/A  . Years of education: N/A   Occupational History  . Not on file.   Social History Main Topics  . Smoking status: Current Some Day Smoker    Packs/day: 0.25    Years: 40.00    Types: Cigarettes  . Smokeless tobacco: Never Used     Comment: 02/28/2013 "started smoking eletronic cigarettes a couple years ago"  . Alcohol use 3.0 oz/week    5 Glasses of wine per week  . Drug use: No  . Sexual activity: No   Other Topics Concern  . Not on file   Social History Narrative  . No narrative on file    Family History  Problem Relation Age of Onset  . Alzheimer's disease Mother   . Prostate cancer Unknown        grandfather  . Stroke Unknown        grandparent    Outpatient Encounter Prescriptions as of 11/11/2016  Medication Sig  . albuterol (PROVENTIL HFA;VENTOLIN HFA) 108 (90 Base) MCG/ACT inhaler Inhale 2 puffs into the lungs every 6 (six) hours as needed for wheezing.  Marland Kitchen atenolol (TENORMIN) 100 MG tablet Take 1 tablet (100 mg total) by mouth 2 (two) times daily.  Marland Kitchen atorvastatin (LIPITOR) 20 MG tablet Take 1 tablet (20 mg total) by mouth daily.  Marland Kitchen losartan (COZAAR) 100 MG tablet Take 1 tablet (100 mg total) by mouth daily.  . metoCLOPramide (REGLAN) 10 MG tablet Take 0.5 tablets (5 mg total) by mouth every 6 (six) hours as needed for nausea (nausea/reflux).  Marland Kitchen  omeprazole (PRILOSEC) 40 MG capsule Take 1 capsule (40 mg total) by mouth daily.  Marland Kitchen oxyCODONE-acetaminophen (ROXICET) 5-325 MG tablet Take 1 tablet by mouth every 6 (six) hours as needed for severe pain.  Marland Kitchen sertraline (ZOLOFT) 50 MG tablet 1/2 tab po QHS x 5 days and then increase to whole tab daily  . solifenacin (VESICARE) 5 MG tablet Take 1 tablet (5 mg total) by mouth daily.  . predniSONE (DELTASONE) 20 MG tablet Take 2 tablets (40 mg total) by mouth daily.  . traZODone (DESYREL) 50 MG tablet Take 0.5-1 tablets (25-50 mg total) by mouth at bedtime as needed for sleep.   No facility-administered encounter medications on file as of 11/11/2016.           Objective:   Physical Exam  Constitutional: She is oriented to person, place, and time. She appears well-developed and well-nourished.  HENT:  Head: Normocephalic and atraumatic.  Cardiovascular: Normal rate, regular rhythm and normal heart sounds.   Pulmonary/Chest: Effort normal and breath sounds normal.  Neurological: She is alert and oriented to person, place, and time.  Skin: Skin is warm and dry.  Psychiatric: She has a normal mood and affect. Her behavior is normal.        Assessment & Plan:  Vomiting w/ nausea - Overall it's much better. Still feeling some nauseated but improved. I explained I think getting her sodium level back up closer to the normal range has probably improved her symptoms. I would like to recheck the level in early next week to make sure it continues to improve. If her nausea persists even after her sodium completely recovers then consider ultrasound of the gallbladder.  Hyponatemia - Due to recheck labs and she is now off the [place] chlorothiazide.  Insomnia - will try 25-50 mg of trazodone at bedtime. Did warn that the prednisone can actually keep her awake at night but I do think that this would help since she is now off the alprazolam.  Right sided sciatica - trial of prednisone. If not improving  them please let me know.  Hypertension-a little on the higher in the still under 140 off of the HCTZ. We'll continue just the plain losartan for now and may need to consider another medication if not maximally controlled on the losartan and atenolol.

## 2016-11-12 LAB — BASIC METABOLIC PANEL WITH GFR
BUN: 9 mg/dL (ref 7–25)
CO2: 22 mmol/L (ref 20–31)
Calcium: 9.3 mg/dL (ref 8.6–10.4)
Chloride: 95 mmol/L — ABNORMAL LOW (ref 98–110)
Creat: 0.72 mg/dL (ref 0.60–0.93)
GFR, EST NON AFRICAN AMERICAN: 83 mL/min (ref >=60)
GFR, Est African American: >89 mL/min (ref >=60)
Glucose, Bld: 103 mg/dL — ABNORMAL HIGH (ref 65–99)
POTASSIUM: 4.1 mmol/L (ref 3.5–5.3)
Sodium: 132 mmol/L — ABNORMAL LOW (ref 135–146)

## 2016-12-09 ENCOUNTER — Ambulatory Visit (INDEPENDENT_AMBULATORY_CARE_PROVIDER_SITE_OTHER): Payer: Medicare Other | Admitting: Family Medicine

## 2016-12-09 ENCOUNTER — Ambulatory Visit (INDEPENDENT_AMBULATORY_CARE_PROVIDER_SITE_OTHER): Payer: Medicare Other

## 2016-12-09 ENCOUNTER — Encounter: Payer: Self-pay | Admitting: Family Medicine

## 2016-12-09 VITALS — BP 163/82 | HR 74 | Wt 144.0 lb

## 2016-12-09 DIAGNOSIS — E871 Hypo-osmolality and hyponatremia: Secondary | ICD-10-CM

## 2016-12-09 DIAGNOSIS — R630 Anorexia: Secondary | ICD-10-CM

## 2016-12-09 DIAGNOSIS — F411 Generalized anxiety disorder: Secondary | ICD-10-CM

## 2016-12-09 DIAGNOSIS — R110 Nausea: Secondary | ICD-10-CM

## 2016-12-09 DIAGNOSIS — R51 Headache: Secondary | ICD-10-CM

## 2016-12-09 DIAGNOSIS — R519 Headache, unspecified: Secondary | ICD-10-CM

## 2016-12-09 DIAGNOSIS — G8929 Other chronic pain: Secondary | ICD-10-CM | POA: Diagnosis not present

## 2016-12-09 DIAGNOSIS — R112 Nausea with vomiting, unspecified: Secondary | ICD-10-CM | POA: Diagnosis not present

## 2016-12-09 LAB — CBC WITH DIFFERENTIAL/PLATELET
BASOS ABS: 0 {cells}/uL (ref 0–200)
Basophils Relative: 0 %
EOS PCT: 3 %
Eosinophils Absolute: 165 cells/uL (ref 15–500)
HEMATOCRIT: 36.7 % (ref 35.0–45.0)
HEMOGLOBIN: 12.6 g/dL (ref 11.7–15.5)
Lymphocytes Relative: 26 %
Lymphs Abs: 1430 cells/uL (ref 850–3900)
MCH: 34.1 pg — AB (ref 27.0–33.0)
MCHC: 34.3 g/dL (ref 32.0–36.0)
MCV: 99.2 fL (ref 80.0–100.0)
MONOS PCT: 15 %
MPV: 10.5 fL (ref 7.5–12.5)
Monocytes Absolute: 825 cells/uL (ref 200–950)
NEUTROS ABS: 3080 {cells}/uL (ref 1500–7800)
NEUTROS PCT: 56 %
Platelets: 266 10*3/uL (ref 140–400)
RBC: 3.7 MIL/uL — ABNORMAL LOW (ref 3.80–5.10)
RDW: 13 % (ref 11.0–15.0)
WBC: 5.5 10*3/uL (ref 3.8–10.8)

## 2016-12-09 MED ORDER — PROMETHAZINE HCL 25 MG PO TABS: 25.0000 mg | ORAL_TABLET | Freq: Three times a day (TID) | ORAL | 0 refills | Status: DC | PRN

## 2016-12-09 MED ORDER — AMLODIPINE BESYLATE 5 MG PO TABS: 5.0000 mg | ORAL_TABLET | Freq: Every day | ORAL | 3 refills | Status: DC

## 2016-12-09 MED ORDER — FLUOXETINE HCL 10 MG PO CAPS: 10.0000 mg | ORAL_CAPSULE | Freq: Every day | ORAL | 1 refills | Status: DC

## 2016-12-09 MED ORDER — OXYCODONE-ACETAMINOPHEN 5-325 MG PO TABS: 1.0000 | ORAL_TABLET | Freq: Four times a day (QID) | ORAL | 0 refills | Status: DC | PRN

## 2016-12-09 NOTE — Progress Notes (Signed)
Subjective:    Patient ID: Annette Ramirez, female    DOB: 1946/04/13, 71 y.o.   MRN: [id number]  HPI 71 year old comes in to follow-up on vomiting and hyponatremia. She is still feeling nauseated and feeling a little dizzy. In regards to the hyponatremia recommended stopping the hydrochlorothiazide component of her blood pressure medication.Unfortunately she is really not feeling significantly better. She still feeling very nauseated and at times even lightheaded she. She denies room spinning or dizziness. She says she's barely been able to eat over the last week or 2. In fact she has been taking only about 4 bites of food in her mouth starts to salivate and she feels like she's going to vomit but she hasn't vomited recently. She says she'll usually just stop eating. She says she's probably only eating a handful of food every day. She did have a pretty intense workup with GI for persistent diarrhea about 4 months ago. They did tell her that she could use Imodium when needed for diarrhea. The diarrhea is actually been little bit better. No fevers chills or sweats.  Hypertension- Pt denies chest pain, SOB, dizziness, or heart palpitations.  Taking meds as directed w/o problems.  Denies medication side effects.    Also needs refill on her pain medications.   Hyponatremia-around the time that she was really sick her sodium was particularly low and we felt that that could be the cause of what was going on. Repeat sodium was still low but improved in closer to her actual baseline. She still had a significantly decreased appetite. And more recently over about the last week she's been getting right-sided headaches. She describes it as a shooting pain it then turns more into a dull ache. She said she used to get bad headaches years ago but nothing probably over the last 4-5 years.  Her father passed away recently and she has been grieving. She still feeling extremely anxious. We stopped the Xanax because of  education with the oxycodone.  Requesting a refill on her pain medication today as well.  Review of Systems  BP (!) 163/82   Pulse 74   Wt 144 lb (65.3 kg)   SpO2 96%   BMI 26.34 kg/m     Allergies  Allergen Reactions  . Amoxicillin-Pot Clavulanate     REACTION: nausea and abdominal pain  . Aspirin     REACTION: upset stomach  . Codeine     REACTION: nausea    Past Medical History:  Diagnosis Date  . Anxiety   . Asthma   . COPD (chronic obstructive pulmonary disease) ([place])   . Depression   . Exertional shortness of breath   . Family history of anesthesia complication    "granddaughter has PONV" (02/28/2013)  . Fibromyalgia   . GERD (gastroesophageal reflux disease)   . H/O hiatal hernia   . Hypertension   . Migraines    "in the past; they've stopped" (02/28/2013)  . Osteoporosis   . PONV (postoperative nausea and vomiting)   . Sinus headache     Past Surgical History:  Procedure Laterality Date  . ABDOMINAL HYSTERECTOMY  1975  . APPENDECTOMY  1975  . BREAST CYST ASPIRATION Left 1980's  . COLONOSCOPY W/ BIOPSIES AND POLYPECTOMY  2012  . DILATION AND CURETTAGE OF UTERUS  1960's  . EXCISIONAL HEMORRHOIDECTOMY  1980's  . LUMBAR LAMINECTOMY  02/28/2013  . LUMBAR LAMINECTOMY/DECOMPRESSION MICRODISCECTOMY N/A 02/28/2013   Procedure: LUMBAR LAMINECTOMY/DECOMPRESSION MICRODISCECTOMY/Lumbar 4-5 decompression;  Surgeon: Lawrence Santiago  Dumonski, MD;  Location: [facility];  Service: Orthopedics;  Laterality: N/A;  Lumbar 4-5 decompression  . TONSILLECTOMY  1950  . TUBAL LIGATION  1972    Social History   Social History  . Marital status: Married    Spouse name: N/A  . Number of children: N/A  . Years of education: N/A   Occupational History  . Not on file.   Social History Main Topics  . Smoking status: Current Some Day Smoker    Packs/day: 0.25    Years: 40.00    Types: Cigarettes  . Smokeless tobacco: Never Used     Comment: 02/28/2013 "started smoking  eletronic cigarettes a couple years ago"  . Alcohol use 3.0 oz/week    5 Glasses of wine per week  . Drug use: No  . Sexual activity: No   Other Topics Concern  . Not on file   Social History Narrative  . No narrative on file    Family History  Problem Relation Age of Onset  . Alzheimer's disease Mother   . Prostate cancer Unknown        grandfather  . Stroke Unknown        grandparent    Outpatient Encounter Prescriptions as of 12/09/2016  Medication Sig  . albuterol (PROVENTIL HFA;VENTOLIN HFA) 108 (90 Base) MCG/ACT inhaler Inhale 2 puffs into the lungs every 6 (six) hours as needed for wheezing.  Marland Kitchen amLODipine (NORVASC) 5 MG tablet Take 1 tablet (5 mg total) by mouth daily.  Marland Kitchen atenolol (TENORMIN) 100 MG tablet Take 1 tablet (100 mg total) by mouth 2 (two) times daily.  Marland Kitchen atorvastatin (LIPITOR) 20 MG tablet Take 1 tablet (20 mg total) by mouth daily.  Marland Kitchen FLUoxetine (PROZAC) 10 MG capsule Take 1 capsule (10 mg total) by mouth daily. Increase to to 2 tabs QD after one week.  . losartan (COZAAR) 100 MG tablet Take 1 tablet (100 mg total) by mouth daily.  Marland Kitchen omeprazole (PRILOSEC) 40 MG capsule Take 1 capsule (40 mg total) by mouth daily.  Marland Kitchen oxyCODONE-acetaminophen (ROXICET) 5-325 MG tablet Take 1 tablet by mouth every 6 (six) hours as needed for severe pain.  . traZODone (DESYREL) 50 MG tablet Take 0.5-1 tablets (25-50 mg total) by mouth at bedtime as needed for sleep.  . [DISCONTINUED] oxyCODONE-acetaminophen (ROXICET) 5-325 MG tablet Take 1 tablet by mouth every 6 (six) hours as needed for severe pain.  . promethazine (PHENERGAN) 25 MG tablet Take 1 tablet (25 mg total) by mouth every 8 (eight) hours as needed for nausea or vomiting.  . [DISCONTINUED] metoCLOPramide (REGLAN) 10 MG tablet Take 0.5 tablets (5 mg total) by mouth every 6 (six) hours as needed for nausea (nausea/reflux).  . [DISCONTINUED] predniSONE (DELTASONE) 20 MG tablet Take 2 tablets (40 mg total) by mouth daily.  .  [DISCONTINUED] sertraline (ZOLOFT) 50 MG tablet 1/2 tab po QHS x 5 days and then increase to whole tab daily  . [DISCONTINUED] solifenacin (VESICARE) 5 MG tablet Take 1 tablet (5 mg total) by mouth daily.   No facility-administered encounter medications on file as of 12/09/2016.          Objective:   Physical Exam  Constitutional: She is oriented to person, place, and time. She appears well-developed and well-nourished.  HENT:  Head: Normocephalic and atraumatic.  Eyes: Conjunctivae are normal.  Cardiovascular: Normal rate, regular rhythm and normal heart sounds.   Pulmonary/Chest: Effort normal and breath sounds normal.  Abdominal: Soft. Bowel sounds are normal.  She exhibits no distension and no mass. There is tenderness. There is no rebound and no guarding.  She has tenderness in the left upper quadrant, epigastric and right upper quadrant area. No palpable masses.  Neurological: She is alert and oriented to person, place, and time.  Skin: Skin is warm and dry.  Psychiatric: She has a normal mood and affect. Her behavior is normal. Thought content normal.       Assessment & Plan:  Nausea-Did send of her prescription for Phenergan for her to use as needed. Her Nausea seems quite severe and I did encourage her to call her GI doctor since this is a fairly new symptom been what she was seeing him for several months ago, the diarrhea. Eval for pancreatitis.   Hyponatremia-we'll recheck levels to see if it has dropped low again since she starting to feel worse. That may still be at her baseline. It's dropped again now that she is off the [place] chlorothiazide will need referral to endocrinology for further workup.  Hypertension-uncontrolled today. It was elevated at last office visit as well again because we did stop had chlorothiazide some epidural low dose of amlodipine.  Generalized anxiety disorder-she still struggling coming off the alprazolam. She's been on it for years. We have stopped  it because she also takes oxycodone. She is willing to try something else will start with fluoxetine [place] grams daily and after one week increase to 20 mg. Follow-up in one month.  Encounter for chronic pain management-refill medication today. Check the [facility] controlled substance registry.

## 2016-12-10 LAB — COMPLETE METABOLIC PANEL WITH GFR
ALBUMIN: 4.5 g/dL (ref 3.6–5.1)
ALK PHOS: 58 U/L (ref 33–130)
ALT: 13 U/L (ref 6–29)
AST: 21 U/L (ref 10–35)
BILIRUBIN TOTAL: 1.1 mg/dL (ref 0.2–1.2)
BUN: 8 mg/dL (ref 7–25)
CALCIUM: 9.5 mg/dL (ref 8.6–10.4)
CO2: 24 mmol/L (ref 20–32)
Chloride: 97 mmol/L — ABNORMAL LOW (ref 98–110)
Creat: 0.67 mg/dL (ref 0.60–0.93)
GFR, Est African American: >89 mL/min (ref >=60)
GFR, Est Non African American: 87 mL/min (ref >=60)
GLUCOSE: 101 mg/dL — AB (ref 65–99)
Potassium: 4 mmol/L (ref 3.5–5.3)
SODIUM: 136 mmol/L (ref 135–146)
TOTAL PROTEIN: 6.4 g/dL (ref 6.1–8.1)

## 2016-12-10 LAB — AMYLASE: Amylase: 30 U/L (ref 21–101)

## 2016-12-10 LAB — LIPASE: Lipase: 9 U/L (ref 7–60)

## 2016-12-15 DIAGNOSIS — R112 Nausea with vomiting, unspecified: Secondary | ICD-10-CM | POA: Diagnosis not present

## 2016-12-15 DIAGNOSIS — R55 Syncope and collapse: Secondary | ICD-10-CM | POA: Diagnosis not present

## 2016-12-15 DIAGNOSIS — R1013 Epigastric pain: Secondary | ICD-10-CM | POA: Diagnosis not present

## 2016-12-16 DIAGNOSIS — K317 Polyp of stomach and duodenum: Secondary | ICD-10-CM | POA: Diagnosis not present

## 2016-12-16 DIAGNOSIS — R112 Nausea with vomiting, unspecified: Secondary | ICD-10-CM | POA: Diagnosis not present

## 2016-12-16 DIAGNOSIS — K3189 Other diseases of stomach and duodenum: Secondary | ICD-10-CM | POA: Diagnosis not present

## 2016-12-20 DIAGNOSIS — J322 Chronic ethmoidal sinusitis: Secondary | ICD-10-CM | POA: Diagnosis not present

## 2016-12-20 DIAGNOSIS — R55 Syncope and collapse: Secondary | ICD-10-CM | POA: Diagnosis not present

## 2016-12-20 DIAGNOSIS — R479 Unspecified speech disturbances: Secondary | ICD-10-CM | POA: Diagnosis not present

## 2016-12-20 DIAGNOSIS — R112 Nausea with vomiting, unspecified: Secondary | ICD-10-CM | POA: Diagnosis not present

## 2016-12-21 DIAGNOSIS — R110 Nausea: Secondary | ICD-10-CM | POA: Diagnosis not present

## 2016-12-21 DIAGNOSIS — J449 Chronic obstructive pulmonary disease, unspecified: Secondary | ICD-10-CM | POA: Diagnosis not present

## 2016-12-21 DIAGNOSIS — R2689 Other abnormalities of gait and mobility: Secondary | ICD-10-CM | POA: Diagnosis not present

## 2016-12-21 DIAGNOSIS — E871 Hypo-osmolality and hyponatremia: Secondary | ICD-10-CM | POA: Diagnosis not present

## 2016-12-21 DIAGNOSIS — R42 Dizziness and giddiness: Secondary | ICD-10-CM | POA: Diagnosis not present

## 2016-12-21 DIAGNOSIS — R413 Other amnesia: Secondary | ICD-10-CM | POA: Diagnosis not present

## 2017-01-04 DIAGNOSIS — R413 Other amnesia: Secondary | ICD-10-CM | POA: Diagnosis not present

## 2017-01-04 DIAGNOSIS — R2689 Other abnormalities of gait and mobility: Secondary | ICD-10-CM | POA: Diagnosis not present

## 2017-01-04 DIAGNOSIS — R42 Dizziness and giddiness: Secondary | ICD-10-CM | POA: Diagnosis not present

## 2017-01-04 DIAGNOSIS — Z820 Family history of epilepsy and other diseases of the nervous system: Secondary | ICD-10-CM | POA: Diagnosis not present

## 2017-01-04 DIAGNOSIS — R110 Nausea: Secondary | ICD-10-CM | POA: Diagnosis not present

## 2017-01-04 DIAGNOSIS — E871 Hypo-osmolality and hyponatremia: Secondary | ICD-10-CM | POA: Diagnosis not present

## 2017-01-04 DIAGNOSIS — I6523 Occlusion and stenosis of bilateral carotid arteries: Secondary | ICD-10-CM | POA: Diagnosis not present

## 2017-01-06 ENCOUNTER — Ambulatory Visit (INDEPENDENT_AMBULATORY_CARE_PROVIDER_SITE_OTHER): Payer: Medicare Other | Admitting: Family Medicine

## 2017-01-06 ENCOUNTER — Encounter: Payer: Self-pay | Admitting: Family Medicine

## 2017-01-06 VITALS — BP 126/78 | HR 66 | Wt 142.0 lb

## 2017-01-06 DIAGNOSIS — Z23 Encounter for immunization: Secondary | ICD-10-CM

## 2017-01-06 DIAGNOSIS — M5431 Sciatica, right side: Secondary | ICD-10-CM | POA: Diagnosis not present

## 2017-01-06 DIAGNOSIS — M545 Low back pain, unspecified: Secondary | ICD-10-CM

## 2017-01-06 DIAGNOSIS — R634 Abnormal weight loss: Secondary | ICD-10-CM | POA: Diagnosis not present

## 2017-01-06 DIAGNOSIS — R630 Anorexia: Secondary | ICD-10-CM | POA: Diagnosis not present

## 2017-01-06 DIAGNOSIS — G609 Hereditary and idiopathic neuropathy, unspecified: Secondary | ICD-10-CM | POA: Insufficient documentation

## 2017-01-06 DIAGNOSIS — F411 Generalized anxiety disorder: Secondary | ICD-10-CM

## 2017-01-06 DIAGNOSIS — G8929 Other chronic pain: Secondary | ICD-10-CM

## 2017-01-06 MED ORDER — PREDNISONE 20 MG PO TABS: 40.0000 mg | ORAL_TABLET | Freq: Every day | ORAL | 0 refills | Status: DC

## 2017-01-06 MED ORDER — GABAPENTIN 100 MG PO CAPS: ORAL_CAPSULE | ORAL | 1 refills | Status: DC

## 2017-01-06 NOTE — Progress Notes (Addendum)
Subjective:    Patient ID: Annette Ramirez, female    DOB: 11-28-1945, 71 y.o.   MRN: [id number]  HPI   Here for  four-week follow-up for Nausea And dizziness. She is a 71 year old female who came in about 4 weeks ago fo and hyponatremia.I encouraged her to call her GI doctor at that point in time as her nausea seemed quite severe. She did see her Neurologist on 01/06/2017.  They felt the etiology was unclear at that timeRI was unremarkable. They did do some additional blood work  And schedule her for carotid Dopplers. she says it's not really dizziness it is more like feeling as if her body is separate from her head and like she juscan't quite get her balance when  when she walks. She feels "unsure of " her footing.   Still doesn't feel as nauseated but doesn't have a loss of  appetite.  She has dropped 2 more lbs.    MRI Head WO IV Contrast8/21/2018 [facility] Result Impression  IMPRESSION: 1.No acute intracranial abnormality. 2.Mild pontine gliosis, most likely due to chronic microvascular disease.  Electronically Signed by: Bryon Lions  Result Narrative  INDICATION: Syncope/fainting. Chronic nausea/vomiting. Difficult with speech for 1.5 weeks. Symptoms started 2 months and is now more severe.  STUDY: MRI of the head without intravenous contrast performed on 12/20/2016 2:51 PM.  COMPARISON: None.  TECHNIQUE: Multiplanar, multisequence MR imaging of the brain was obtained without IV contrast.   Contrast: None.  FINDINGS: #Skull/marrow/soft tissues: Unremarkable. #Orbits: Unremarkable. #Sinuses/mastoid air cells: Minimal mucosal inflammatory changes in the posterior LEFT ethmoid air cells. The rest of paranasal sinuses and mastoid air cells are patent. #Vessels: Normal flow voids within the major intracranial vessels. #Brain: A few punctate foci of increased T2-weighted signal intensity in the pons. The rest of the parenchyma, sulci, cisterns and ventricles are  unremarkable. No acute infarction is identified. There is no extra-axial fluid collection or intracranial  hemorrhage. There is no mass effect or midline shift. #Additional comments: None.     Generalized anxiety disorder-she has had difficulty coming off for her alprazam which she had been on for years. She is also on oxycodone. We offered to put her on fluoxetine.  She also wants to discuss her feet. She feels that her neuropathy is actually getting a little bit worse. She she's getting littlee last couple of months. She's not currently on any specific treatment for peripheral neuropathy. She tries to massage them. Pain in constant.    Encounter for chronic pain management/  Chronic bilateral low back pain-he has had a flare with her sciatica on the right side. She's hoping that it will go away. She felt her stretches that she did when she was in physical tpreviously. She has not started them yet. She's g any additional treatments at this time.    Review of Systems  BP 126/78   Pulse 66   Wt 142 lb (64.4 kg)   SpO2 99%   BMI 25.97 kg/m     Allergies  Allergen Reactions  . Amoxicillin-Pot Clavulanate     REACTION: nausea and abdominal pain  . Aspirin     REACTION: upset stomach  . Codeine     REACTION: nausea  . Hctz [Hydrochlorothiazide] Other (See Comments)    Hyponatremia    Past Medical History:  Diagnosis Date  . Anxiety   . Asthma   . COPD (chronic obstructive pulmonary disease) ([place])   . Depression   . Exertional shortness  of breath   . Family history of anesthesia complication    "granddaughter has PONV" (02/28/2013)  . Fibromyalgia   . GERD (gastroesophageal reflux disease)   . H/O hiatal hernia   . Hypertension   . Migraines    "in the past; they've stopped" (02/28/2013)  . Osteoporosis   . PONV (postoperative nausea and vomiting)   . Sinus headache     Past Surgical History:  Procedure Laterality Date  . ABDOMINAL HYSTERECTOMY  1975  .  APPENDECTOMY  1975  . BREAST CYST ASPIRATION Left 1980's  . COLONOSCOPY W/ BIOPSIES AND POLYPECTOMY  2012  . DILATION AND CURETTAGE OF UTERUS  1960's  . EXCISIONAL HEMORRHOIDECTOMY  1980's  . LUMBAR LAMINECTOMY  02/28/2013  . LUMBAR LAMINECTOMY/DECOMPRESSION MICRODISCECTOMY N/A 02/28/2013   Procedure: LUMBAR LAMINECTOMY/DECOMPRESSION MICRODISCECTOMY/Lumbar 4-5 decompression;  Surgeon: Sinclair Ship, MD;  Location: [place];  Service: Orthopedics;  Laterality: N/A;  Lumbar 4-5 decompression  . TONSILLECTOMY  1950  . TUBAL LIGATION  1972    Social History   Social History  . Marital status: Married    Spouse name: N/A  . Number of children: N/A  . Years of education: N/A   Occupational History  . Not on file.   Social History Main Topics  . Smoking status: Current Some Day Smoker    Packs/day: 0.25    Years: 40.00    Types: Cigarettes  . Smokeless tobacco: Never Used     Comment: 02/28/2013 "started smoking eletronic cigarettes a couple years ago"  . Alcohol use 3.0 oz/week    5 Glasses of wine per week  . Drug use: No  . Sexual activity: No   Other Topics Concern  . Not on file   Social History Narrative  . No narrative on file    Family History  Problem Relation Age of Onset  . Alzheimer's disease Mother   . Prostate cancer Unknown        grandfather  . Stroke Unknown        grandparent    Outpatient Encounter Prescriptions as of 01/06/2017  Medication Sig  . albuterol (PROVENTIL HFA;VENTOLIN HFA) 108 (90 Base) MCG/ACT inhaler Inhale 2 puffs into the lungs every 6 (six) hours as needed for wheezing.  Marland Kitchen amLODipine (NORVASC) 5 MG tablet Take 1 tablet (5 mg total) by mouth daily.  Marland Kitchen atenolol (TENORMIN) 100 MG tablet Take 1 tablet (100 mg total) by mouth 2 (two) times daily.  Marland Kitchen atorvastatin (LIPITOR) 20 MG tablet Take 1 tablet (20 mg total) by mouth daily.  Marland Kitchen FLUoxetine (PROZAC) 10 MG capsule Take 1 capsule (10 mg total) by mouth daily. Increase to to 2 tabs QD  after one week.  . losartan (COZAAR) 100 MG tablet Take 1 tablet (100 mg total) by mouth daily.  Marland Kitchen omeprazole (PRILOSEC) 40 MG capsule Take 1 capsule (40 mg total) by mouth daily.  Marland Kitchen oxyCODONE-acetaminophen (ROXICET) 5-325 MG tablet Take 1 tablet by mouth every 6 (six) hours as needed for severe pain.  . promethazine (PHENERGAN) 25 MG tablet Take 1 tablet (25 mg total) by mouth every 8 (eight) hours as needed for nausea or vomiting.  . traZODone (DESYREL) 50 MG tablet Take 0.5-1 tablets (25-50 mg total) by mouth at bedtime as needed for sleep.  Marland Kitchen gabapentin (NEURONTIN) 100 MG capsule 1 po QHS x 3 days, then increase to 2 tabs po QHS x 3 days, then increase to 3 tabs po QHS.  Marland Kitchen predniSONE (DELTASONE) 20 MG tablet  Take 2 tablets (40 mg total) by mouth daily.   No facility-administered encounter medications on file as of 01/06/2017.          Objective:   Physical Exam  Constitutional: She is oriented to person, place, and time. She appears well-developed and well-nourished.  HENT:  Head: Normocephalic and atraumatic.  Cardiovascular: Normal rate, regular rhythm and normal heart sounds.   Pulmonary/Chest: Effort normal and breath sounds normal.  Musculoskeletal:  Left hip, knee, ankle strength is 5 out of 5. Negative straight leg raise on the left. Patellar reflexes 2+ bilaterally.  Neurological: She is alert and oriented to person, place, and time.  Skin: Skin is warm and dry.  Psychiatric: She has a normal mood and affect. Her behavior is normal.        Assessment & Plan:  Anxiety disorder- GAD 7 score of 8, PHQ- 9 score  13.  She is just feeling stress by her recent health issues. Continue current regimen for now.     Peripheral neuropathy-discussed options. Will start with a trial of gabapentin. Warned about potential side effects.    Loss of appetite - unclear etiology but no longer nauseated. She is down 2 more lbs. Taking her PPI.    Right sided sciatica - tx with prednisone  burst and discussed starting home exercising again.   Chronic pain management/chronic bilateral low back pain-heck [facility] for appropriate prescribing see above. Medication refill today.  Pain contract on file from 01/06/2016. Low risk opioid score.

## 2017-01-18 ENCOUNTER — Other Ambulatory Visit: Payer: Self-pay | Admitting: Family Medicine

## 2017-01-20 ENCOUNTER — Telehealth: Payer: Self-pay

## 2017-01-20 NOTE — Telephone Encounter (Signed)
Pt requesting a refill on oxycodone.  Please advise.

## 2017-01-21 MED ORDER — OXYCODONE-ACETAMINOPHEN 5-325 MG PO TABS: 1.0000 | ORAL_TABLET | Freq: Four times a day (QID) | ORAL | 0 refills | Status: DC | PRN

## 2017-01-21 NOTE — Telephone Encounter (Signed)
Ok to pick up at her convenience.

## 2017-01-21 NOTE — Telephone Encounter (Signed)
Notified patient.

## 2017-02-03 ENCOUNTER — Ambulatory Visit (INDEPENDENT_AMBULATORY_CARE_PROVIDER_SITE_OTHER): Payer: Medicare Other | Admitting: Family Medicine

## 2017-02-03 ENCOUNTER — Encounter: Payer: Self-pay | Admitting: Family Medicine

## 2017-02-03 VITALS — BP 116/61 | HR 74 | Ht 62.0 in | Wt 141.0 lb

## 2017-02-03 DIAGNOSIS — G609 Hereditary and idiopathic neuropathy, unspecified: Secondary | ICD-10-CM

## 2017-02-03 DIAGNOSIS — M5431 Sciatica, right side: Secondary | ICD-10-CM

## 2017-02-03 DIAGNOSIS — R630 Anorexia: Secondary | ICD-10-CM

## 2017-02-03 MED ORDER — GABAPENTIN 100 MG PO CAPS: ORAL_CAPSULE | ORAL | 1 refills | Status: DC

## 2017-02-03 NOTE — Progress Notes (Signed)
Subjective:    Patient ID: Annette Ramirez, female    DOB: 11-14-1945, 71 y.o.   MRN: [id number]  HPI 79 female comes in today to follow-up for peripheral neuropathy. When I last saw her we decided to start her on gabapentin.She does feel like she's had some benefit with the gabapentin. Check she feels like she's also been sleeping better she is up to 3 mg at bedtime. She still continued to have some severe right lower sciatic back pain. She had a nerve ablation done by Dr. Teryl Lucy back in March and got relief for about 6 months. She said she's at the point where she's thinking about maybe having it done again it's getting close to the holidays and she wants to feel better.  Anorexia - I last saw her she has lost about 2 pounds because of decreased appetite.  Review of Systems  BP 116/61   Pulse 74   Ht 5\' 2"  (1.575 m)   Wt 141 lb (64 kg)   SpO2 94%   BMI 25.79 kg/m     Allergies  Allergen Reactions  . Amoxicillin-Pot Clavulanate     REACTION: nausea and abdominal pain  . Aspirin     REACTION: upset stomach  . Codeine     REACTION: nausea  . Hctz [Hydrochlorothiazide] Other (See Comments)    Hyponatremia    Past Medical History:  Diagnosis Date  . Anxiety   . Asthma   . COPD (chronic obstructive pulmonary disease) ([place])   . Depression   . Exertional shortness of breath   . Family history of anesthesia complication    "granddaughter has PONV" (02/28/2013)  . Fibromyalgia   . GERD (gastroesophageal reflux disease)   . H/O hiatal hernia   . Hypertension   . Migraines    "in the past; they've stopped" (02/28/2013)  . Osteoporosis   . PONV (postoperative nausea and vomiting)   . Sinus headache     Past Surgical History:  Procedure Laterality Date  . ABDOMINAL HYSTERECTOMY  1975  . APPENDECTOMY  1975  . BREAST CYST ASPIRATION Left 1980's  . COLONOSCOPY W/ BIOPSIES AND POLYPECTOMY  2012  . DILATION AND CURETTAGE OF UTERUS  1960's  . EXCISIONAL HEMORRHOIDECTOMY   1980's  . LUMBAR LAMINECTOMY  02/28/2013  . LUMBAR LAMINECTOMY/DECOMPRESSION MICRODISCECTOMY N/A 02/28/2013   Procedure: LUMBAR LAMINECTOMY/DECOMPRESSION MICRODISCECTOMY/Lumbar 4-5 decompression;  Surgeon: Sinclair Ship, MD;  Location: [place];  Service: Orthopedics;  Laterality: N/A;  Lumbar 4-5 decompression  . TONSILLECTOMY  1950  . TUBAL LIGATION  1972    Social History   Social History  . Marital status: Married    Spouse name: N/A  . Number of children: N/A  . Years of education: N/A   Occupational History  . Not on file.   Social History Main Topics  . Smoking status: Current Some Day Smoker    Packs/day: 0.25    Years: 40.00    Types: Cigarettes  . Smokeless tobacco: Never Used     Comment: 02/28/2013 "started smoking eletronic cigarettes a couple years ago"  . Alcohol use 3.0 oz/week    5 Glasses of wine per week  . Drug use: No  . Sexual activity: No   Other Topics Concern  . Not on file   Social History Narrative  . No narrative on file    Family History  Problem Relation Age of Onset  . Alzheimer's disease Mother   . Prostate cancer Unknown  grandfather  . Stroke Unknown        grandparent    Outpatient Encounter Prescriptions as of 02/03/2017  Medication Sig  . albuterol (PROVENTIL HFA;VENTOLIN HFA) 108 (90 Base) MCG/ACT inhaler Inhale 2 puffs into the lungs every 6 (six) hours as needed for wheezing.  Marland Kitchen amLODipine (NORVASC) 5 MG tablet Take 1 tablet (5 mg total) by mouth daily.  Marland Kitchen atenolol (TENORMIN) 100 MG tablet Take 1 tablet (100 mg total) by mouth 2 (two) times daily.  Marland Kitchen atorvastatin (LIPITOR) 20 MG tablet Take 1 tablet (20 mg total) by mouth daily.  Marland Kitchen FLUoxetine (PROZAC) 10 MG capsule Take 1 capsule (10 mg total) by mouth daily. Increase to to 2 tabs QD after one week.  . gabapentin (NEURONTIN) 100 MG capsule 1 po QHS x 3 days, then increase to 2 tabs po QHS x 3 days, then increase to 3 tabs po QHS.  Marland Kitchen losartan (COZAAR) 100 MG  tablet TAKE ONE TABLET BY MOUTH EVERY DAY.  Marland Kitchen omeprazole (PRILOSEC) 40 MG capsule Take 1 capsule (40 mg total) by mouth daily.  Marland Kitchen oxyCODONE-acetaminophen (ROXICET) 5-325 MG tablet Take 1 tablet by mouth every 6 (six) hours as needed for severe pain.  . predniSONE (DELTASONE) 20 MG tablet Take 2 tablets (40 mg total) by mouth daily.  . promethazine (PHENERGAN) 25 MG tablet Take 1 tablet (25 mg total) by mouth every 8 (eight) hours as needed for nausea or vomiting.  . traZODone (DESYREL) 50 MG tablet Take 0.5-1 tablets (25-50 mg total) by mouth at bedtime as needed for sleep.   No facility-administered encounter medications on file as of 02/03/2017.          Objective:   Physical Exam  Constitutional: She is oriented to person, place, and time. She appears well-developed and well-nourished.  HENT:  Head: Normocephalic and atraumatic.  Cardiovascular: Normal rate, regular rhythm and normal heart sounds.   Pulmonary/Chest: Effort normal and breath sounds normal.  Musculoskeletal:  Nontender over the lumbar spine. Nontender over the SI joints or paraspinous muscles. Negative straight leg raise bilaterally. Patellar reflexes 1+ bilaterally. Hip, knee, ankle strength is 5 out of 5.  Neurological: She is alert and oriented to person, place, and time.  Skin: Skin is warm and dry.  Psychiatric: She has a normal mood and affect. Her behavior is normal.         Assessment & Plan:   Peripheral neuropathy-   we'll increase gabapentin to 1 in the morning and 1 at noon and 3 at bedtime for total of 500 mg daily. Follow-up in 2 months.   Lumbar radiculopathy-Did encourage her to get back in with Dr. Teryl Lucy to see if maybe they could do a repeat ablation since she did get relief for at least 6 months.      Anorexia-He has lost 1 more pounds since she was here a month ago. Still losing some little bit of weight will continue to monitor.

## 2017-02-07 ENCOUNTER — Other Ambulatory Visit: Payer: Self-pay | Admitting: Family Medicine

## 2017-02-24 ENCOUNTER — Other Ambulatory Visit: Payer: Self-pay | Admitting: *Deleted

## 2017-02-24 MED ORDER — OXYCODONE-ACETAMINOPHEN 5-325 MG PO TABS: 1.0000 | ORAL_TABLET | Freq: Four times a day (QID) | ORAL | 0 refills | Status: DC | PRN

## 2017-03-01 ENCOUNTER — Other Ambulatory Visit: Payer: Self-pay | Admitting: Family Medicine

## 2017-03-15 ENCOUNTER — Other Ambulatory Visit: Payer: Self-pay | Admitting: Family Medicine

## 2017-03-18 DIAGNOSIS — R110 Nausea: Secondary | ICD-10-CM | POA: Diagnosis not present

## 2017-03-18 DIAGNOSIS — K591 Functional diarrhea: Secondary | ICD-10-CM | POA: Diagnosis not present

## 2017-03-30 ENCOUNTER — Other Ambulatory Visit: Payer: Self-pay | Admitting: Family Medicine

## 2017-04-12 ENCOUNTER — Other Ambulatory Visit: Payer: Self-pay

## 2017-04-12 ENCOUNTER — Other Ambulatory Visit: Payer: Self-pay | Admitting: Family Medicine

## 2017-04-12 NOTE — Telephone Encounter (Signed)
Patient called for a refill of oxycodone.

## 2017-04-12 NOTE — Telephone Encounter (Signed)
Denied.  She was supposed to schedule appointment around December 4 which would have been over a week ago for her regular 72-month follow-up for chronic pain medications.  This cannot be dispensed until she makes an appointment and comes in for an office visit.

## 2017-04-13 ENCOUNTER — Ambulatory Visit (INDEPENDENT_AMBULATORY_CARE_PROVIDER_SITE_OTHER): Payer: Medicare Other | Admitting: Family Medicine

## 2017-04-13 ENCOUNTER — Encounter: Payer: Self-pay | Admitting: Family Medicine

## 2017-04-13 ENCOUNTER — Telehealth: Payer: Self-pay

## 2017-04-13 VITALS — BP 128/72 | HR 69 | Ht 62.0 in | Wt 143.0 lb

## 2017-04-13 DIAGNOSIS — M545 Low back pain, unspecified: Secondary | ICD-10-CM

## 2017-04-13 DIAGNOSIS — F5101 Primary insomnia: Secondary | ICD-10-CM | POA: Diagnosis not present

## 2017-04-13 DIAGNOSIS — F418 Other specified anxiety disorders: Secondary | ICD-10-CM | POA: Diagnosis not present

## 2017-04-13 DIAGNOSIS — G8929 Other chronic pain: Secondary | ICD-10-CM | POA: Diagnosis not present

## 2017-04-13 DIAGNOSIS — G609 Hereditary and idiopathic neuropathy, unspecified: Secondary | ICD-10-CM | POA: Diagnosis not present

## 2017-04-13 MED ORDER — ATENOLOL 100 MG PO TABS: 100.0000 mg | ORAL_TABLET | Freq: Two times a day (BID) | ORAL | 1 refills | Status: DC

## 2017-04-13 MED ORDER — FLUOXETINE HCL 40 MG PO CAPS: 40.0000 mg | ORAL_CAPSULE | Freq: Every day | ORAL | 1 refills | Status: DC

## 2017-04-13 MED ORDER — OXYCODONE-ACETAMINOPHEN 5-325 MG PO TABS: 1.0000 | ORAL_TABLET | Freq: Four times a day (QID) | ORAL | 0 refills | Status: DC | PRN

## 2017-04-13 MED ORDER — TRAZODONE HCL 100 MG PO TABS: 50.0000 mg | ORAL_TABLET | Freq: Every evening | ORAL | 2 refills | Status: DC | PRN

## 2017-04-13 NOTE — Progress Notes (Signed)
Subjective:    Patient ID: Annette Ramirez, female    DOB: 12-Oct-1945, 71 y.o.   MRN: [id number]  HPI  31 female here for medication f/u for neuropathy -we increased her gabapentin at the last office visit 8 weeks ago. She is taking 1 in AM, 1 at lunch and 3 at bedtime.    Follow-up chronic pain management/chronic bilateral low back pain-she is due for refill on her hydrocodone today but also needs an updated pain contract.  She is doing well on her current regimen.  Insomnia - says the trazodone is not working well.  Initially it seemed to be working better but now it does not seem as effective.  She will often get up after an hour of not being her full sleep and watch TV.  Depression/anxiety-she says is been a little bit harder since been around the holidays.  This would be the first year without her father.  She has been grieving.  Review of Systems   BP 128/72   Pulse 69   Ht 5\' 2"  (1.575 m)   Wt 143 lb (64.9 kg)   SpO2 94%   BMI 26.16 kg/m     Allergies  Allergen Reactions  . Amoxicillin-Pot Clavulanate     REACTION: nausea and abdominal pain  . Aspirin     REACTION: upset stomach  . Codeine     REACTION: nausea  . Hctz [Hydrochlorothiazide] Other (See Comments)    Hyponatremia  . Nsaids Nausea Only    Past Medical History:  Diagnosis Date  . Anxiety   . Asthma   . COPD (chronic obstructive pulmonary disease) ([place])   . Depression   . Exertional shortness of breath   . Family history of anesthesia complication    "granddaughter has PONV" (02/28/2013)  . Fibromyalgia   . GERD (gastroesophageal reflux disease)   . H/O hiatal hernia   . Hypertension   . Migraines    "in the past; they've stopped" (02/28/2013)  . Osteoporosis   . PONV (postoperative nausea and vomiting)   . Sinus headache     Past Surgical History:  Procedure Laterality Date  . ABDOMINAL HYSTERECTOMY  1975  . APPENDECTOMY  1975  . BREAST CYST ASPIRATION Left 1980's  . COLONOSCOPY W/  BIOPSIES AND POLYPECTOMY  2012  . DILATION AND CURETTAGE OF UTERUS  1960's  . EXCISIONAL HEMORRHOIDECTOMY  1980's  . LUMBAR LAMINECTOMY  02/28/2013  . LUMBAR LAMINECTOMY/DECOMPRESSION MICRODISCECTOMY N/A 02/28/2013   Procedure: LUMBAR LAMINECTOMY/DECOMPRESSION MICRODISCECTOMY/Lumbar 4-5 decompression;  Surgeon: Sinclair Ship, MD;  Location: [place];  Service: Orthopedics;  Laterality: N/A;  Lumbar 4-5 decompression  . TONSILLECTOMY  1950  . TUBAL LIGATION  1972    Social History   Socioeconomic History  . Marital status: Married    Spouse name: Not on file  . Number of children: Not on file  . Years of education: Not on file  . Highest education level: Not on file  Social Needs  . Financial resource strain: Not on file  . Food insecurity - worry: Not on file  . Food insecurity - inability: Not on file  . Transportation needs - medical: Not on file  . Transportation needs - non-medical: Not on file  Occupational History  . Not on file  Tobacco Use  . Smoking status: Current Some Day Smoker    Packs/day: 0.25    Years: 40.00    Pack years: 10.00    Types: Cigarettes  . Smokeless tobacco:  Never Used  . Tobacco comment: 02/28/2013 "started smoking eletronic cigarettes a couple years ago"  Substance and Sexual Activity  . Alcohol use: Yes    Alcohol/week: 3.0 oz    Types: 5 Glasses of wine per week  . Drug use: No  . Sexual activity: No  Other Topics Concern  . Not on file  Social History Narrative  . Not on file    Family History  Problem Relation Age of Onset  . Alzheimer's disease Mother   . Prostate cancer Unknown        grandfather  . Stroke Unknown        grandparent    Outpatient Encounter Medications as of 04/13/2017  Medication Sig  . albuterol (PROVENTIL HFA;VENTOLIN HFA) 108 (90 Base) MCG/ACT inhaler Inhale 2 puffs into the lungs every 6 (six) hours as needed for wheezing.  Marland Kitchen amLODipine (NORVASC) 5 MG tablet TAKE ONE TABLET BY MOUTH EVERY DAY  .  atenolol (TENORMIN) 100 MG tablet TAKE 1 TABLET BY MOUTH TWO  TIMES DAILY  . atorvastatin (LIPITOR) 20 MG tablet Take 1 tablet (20 mg total) by mouth daily.  Marland Kitchen FLUoxetine (PROZAC) 10 MG capsule TAKE ONE CAPSULE BY MOUTH DAILY, INCREASE TO 2 CAPSULES DAILY AFTER 1 WEEK  . gabapentin (NEURONTIN) 100 MG capsule TAKE ONE CAPSULE BY MOUTH EVERY MORNING, ONE CAPSULE AT NOON, THEN THREE CAPSULES AT BEDTIME  . losartan (COZAAR) 100 MG tablet TAKE ONE TABLET BY MOUTH EVERY DAY.  Marland Kitchen omeprazole (PRILOSEC) 40 MG capsule Take 1 capsule (40 mg total) by mouth daily.  Marland Kitchen oxyCODONE-acetaminophen (ROXICET) 5-325 MG tablet Take 1 tablet by mouth every 6 (six) hours as needed for severe pain.  . promethazine (PHENERGAN) 25 MG tablet Take 1 tablet (25 mg total) by mouth every 8 (eight) hours as needed for nausea or vomiting.  . traZODone (DESYREL) 50 MG tablet TAKE 1/2 TO 1 TABLET BY MOUTH AT BEDTIME AS NEEDED FOR SLEEP.  . [DISCONTINUED] predniSONE (DELTASONE) 20 MG tablet Take 2 tablets (40 mg total) by mouth daily.   No facility-administered encounter medications on file as of 04/13/2017.          Objective:   Physical Exam  Constitutional: She is oriented to person, place, and time. She appears well-developed and well-nourished.  HENT:  Head: Normocephalic and atraumatic.  Cardiovascular: Normal rate, regular rhythm and normal heart sounds.  Pulmonary/Chest: Effort normal and breath sounds normal.  Neurological: She is alert and oriented to person, place, and time.  Skin: Skin is warm and dry.  Psychiatric: She has a normal mood and affect. Her behavior is normal.       Assessment & Plan:  Idiopathic peripheral neuropathy-doing well with current dose of gabapentin.  Continue current regimen.  Chronic pain management/chronic bilateral low back pain-did verify with the controlled substance registry that her last fill on the oxycodone was February 24, 2017. Medication refilled today. UDS performed today.     Insomnia - We discussed options.  We will try increasing the trazodone 200 mg.  It was working well initially.  Her insurance will only pay for trazodone, Belsomra, Rozerem, and Sonata.  She did try the Belsomra back in March and it was not effective for her.  Depression/Anxiety -increase fluoxetine to 40 mg.  Follow-up in 2 months.

## 2017-04-13 NOTE — Telephone Encounter (Signed)
Refill request for oxycodone was approved by mistake under Dr. Dianah Field name but it was denied by Dr. Madilyn Fireman and patient was advised that she needed to schedule a follow up appointment with Dr. Madilyn Fireman for further refills. That refill has been voided and the chart has been updated.  Rhonda Cunningham,CMA

## 2017-04-13 NOTE — Telephone Encounter (Signed)
Spoke to patient advised her that medication could not be refilled until she schedule a follow up appointment. Patient was transferred to scheduling to make a follow up appointment. Ramar Nobrega,CMA

## 2017-04-20 LAB — PAIN MGMT, OPIATES W/CONF, U
CODEINE: NEGATIVE ng/mL (ref <50)
HYDROMORPHONE: NEGATIVE ng/mL (ref <50)
Hydrocodone: NEGATIVE ng/mL (ref <50)
MORPHINE: NEGATIVE ng/mL (ref <50)
NORHYDROCODONE: NEGATIVE ng/mL (ref <50)
Opiates: NEGATIVE ng/mL (ref <100)

## 2017-04-20 LAB — DRUG ABUSE PANEL 10-50 NO CONF, U
AMPHETAMINES (1000 ng/mL SCRN): NEGATIVE
BARBITURATES: NEGATIVE
BENZODIAZEPINES: NEGATIVE
COCAINE METABOLITES: NEGATIVE
MARIJUANA MET (50 ng/mL SCRN): NEGATIVE
METHADONE: NEGATIVE
METHAQUALONE: NEGATIVE
OPIATES: NEGATIVE
PHENCYCLIDINE: NEGATIVE
PROPOXYPHENE: NEGATIVE

## 2017-04-20 LAB — TEST AUTHORIZATION

## 2017-05-02 ENCOUNTER — Other Ambulatory Visit: Payer: Self-pay | Admitting: Family Medicine

## 2017-05-26 ENCOUNTER — Other Ambulatory Visit: Payer: Self-pay | Admitting: Family Medicine

## 2017-05-26 MED ORDER — OXYCODONE-ACETAMINOPHEN 5-325 MG PO TABS: 1.0000 | ORAL_TABLET | Freq: Four times a day (QID) | ORAL | 0 refills | Status: DC | PRN

## 2017-05-26 NOTE — Telephone Encounter (Signed)
Pt advised.

## 2017-06-06 DIAGNOSIS — F1190 Opioid use, unspecified, uncomplicated: Secondary | ICD-10-CM

## 2017-06-06 HISTORY — DX: Opioid use, unspecified, uncomplicated: F11.90

## 2017-06-14 ENCOUNTER — Telehealth: Payer: Self-pay | Admitting: *Deleted

## 2017-06-14 ENCOUNTER — Ambulatory Visit (INDEPENDENT_AMBULATORY_CARE_PROVIDER_SITE_OTHER): Payer: Medicare Other | Admitting: Family Medicine

## 2017-06-14 ENCOUNTER — Encounter: Payer: Self-pay | Admitting: Family Medicine

## 2017-06-14 VITALS — BP 115/64 | HR 64 | Ht 62.0 in | Wt 138.0 lb

## 2017-06-14 DIAGNOSIS — F5101 Primary insomnia: Secondary | ICD-10-CM

## 2017-06-14 DIAGNOSIS — M545 Low back pain, unspecified: Secondary | ICD-10-CM

## 2017-06-14 DIAGNOSIS — G609 Hereditary and idiopathic neuropathy, unspecified: Secondary | ICD-10-CM

## 2017-06-14 DIAGNOSIS — G8929 Other chronic pain: Secondary | ICD-10-CM | POA: Diagnosis not present

## 2017-06-14 DIAGNOSIS — I10 Essential (primary) hypertension: Secondary | ICD-10-CM | POA: Diagnosis not present

## 2017-06-14 DIAGNOSIS — M47816 Spondylosis without myelopathy or radiculopathy, lumbar region: Secondary | ICD-10-CM

## 2017-06-14 MED ORDER — OXYCODONE-ACETAMINOPHEN 5-325 MG PO TABS: 1.0000 | ORAL_TABLET | Freq: Four times a day (QID) | ORAL | 0 refills | Status: DC | PRN

## 2017-06-14 MED ORDER — MIRABEGRON ER 25 MG PO TB24: 25.0000 mg | ORAL_TABLET | Freq: Every day | ORAL | 1 refills | Status: DC

## 2017-06-14 MED ORDER — ZALEPLON 5 MG PO CAPS: 5.0000 mg | ORAL_CAPSULE | Freq: Every evening | ORAL | 1 refills | Status: DC | PRN

## 2017-06-14 NOTE — Telephone Encounter (Signed)
Pt was here for a f/u visit and wanted to get a referral back to Dr. Jola Baptist for Facet injections. She stated that this helped her.   Will fwd to Dr. Georgina Snell since he was the original provider who ordered this for her.Annette Ramirez, [place]

## 2017-06-14 NOTE — Progress Notes (Signed)
Subjective:    Patient ID: Annette Ramirez, female    DOB: November 19, 1945, 72 y.o.   MRN: [id number]  HPI F/u neuropathy -currently on gabapentin.  She takes 3 at bedtime, 1 in the morning and 1 at lunch.  Chronic pain management/chronic bilateral low back pain -she is at the point where she would like to have another injection done.  I believe it was a facet injection done with Dr. Beatris Ship office.  They will require a new order.  She said she did get about 3 months of significant relief after the injection.  She is currently taking her medication regularly without any side effects or problems.  She denies any constipation in fact she has chronic diarrhea.  Mixed incontinence - her insurance will not longer cover Vesicare.  She has been off of her medication and unfortunately having some incontinence episodes that have been very distressing.  Insomnia -  Her insurance will only pay for trazodone, Belsomra, Rozerem, and Sonata.  She did try the Belsomra back in March and it was not effective for her.  She did go up to 100 mg on trazodone and has not found that effective either.  She has been trying to cut back on watching TV in the middle the night but admits that she has not been consistent with it.  She also feels like the urinary frequency is also affecting her sleep quality.  She has been under a lot of stress recently as her daughter recently was diagnosed with Ethelene Hal and is being followed at [facility].  Hypertension- Pt denies chest pain, SOB, dizziness, or heart palpitations.  Taking meds as directed w/o problems.  Denies medication side effects.      Review of Systems  BP 115/64   Pulse 64   Ht 5\' 2"  (1.575 m)   Wt 138 lb (62.6 kg)   SpO2 94%   BMI 25.24 kg/m     Allergies  Allergen Reactions  . Amoxicillin-Pot Clavulanate     REACTION: nausea and abdominal pain  . Aspirin     REACTION: upset stomach  . Codeine     REACTION: nausea  . Hctz [Hydrochlorothiazide] Other  (See Comments)    Hyponatremia  . Nsaids Nausea Only    Past Medical History:  Diagnosis Date  . Anxiety   . Asthma   . COPD (chronic obstructive pulmonary disease) ([place])   . Depression   . Exertional shortness of breath   . Family history of anesthesia complication    "granddaughter has PONV" (02/28/2013)  . Fibromyalgia   . GERD (gastroesophageal reflux disease)   . H/O hiatal hernia   . Hypertension   . Migraines    "in the past; they've stopped" (02/28/2013)  . Opiate use 06/06/2017  . Osteoporosis   . PONV (postoperative nausea and vomiting)   . Sinus headache     Past Surgical History:  Procedure Laterality Date  . ABDOMINAL HYSTERECTOMY  1975  . APPENDECTOMY  1975  . BREAST CYST ASPIRATION Left 1980's  . COLONOSCOPY W/ BIOPSIES AND POLYPECTOMY  2012  . DILATION AND CURETTAGE OF UTERUS  1960's  . EXCISIONAL HEMORRHOIDECTOMY  1980's  . LUMBAR LAMINECTOMY  02/28/2013  . LUMBAR LAMINECTOMY/DECOMPRESSION MICRODISCECTOMY N/A 02/28/2013   Procedure: LUMBAR LAMINECTOMY/DECOMPRESSION MICRODISCECTOMY/Lumbar 4-5 decompression;  Surgeon: Sinclair Ship, MD;  Location: [place];  Service: Orthopedics;  Laterality: N/A;  Lumbar 4-5 decompression  . TONSILLECTOMY  1950  . [place]  Social History   Socioeconomic History  . Marital status: Married    Spouse name: Not on file  . Number of children: Not on file  . Years of education: Not on file  . Highest education level: Not on file  Social Needs  . Financial resource strain: Not on file  . Food insecurity - worry: Not on file  . Food insecurity - inability: Not on file  . Transportation needs - medical: Not on file  . Transportation needs - non-medical: Not on file  Occupational History  . Not on file  Tobacco Use  . Smoking status: Current Some Day Smoker    Packs/day: 0.25    Years: 40.00    Pack years: 10.00    Types: Cigarettes  . Smokeless tobacco: Never Used  . Tobacco comment: 02/28/2013  "started smoking eletronic cigarettes a couple years ago"  Substance and Sexual Activity  . Alcohol use: Yes    Alcohol/week: 3.0 oz    Types: 5 Glasses of wine per week  . Drug use: No  . Sexual activity: No  Other Topics Concern  . Not on file  Social History Narrative  . Not on file    Family History  Problem Relation Age of Onset  . Alzheimer's disease Mother   . Prostate cancer Unknown        grandfather  . Stroke Unknown        grandparent    Outpatient Encounter Medications as of 06/14/2017  Medication Sig  . albuterol (PROVENTIL HFA;VENTOLIN HFA) 108 (90 Base) MCG/ACT inhaler Inhale 2 puffs into the lungs every 6 (six) hours as needed for wheezing.  Marland Kitchen amLODipine (NORVASC) 5 MG tablet TAKE ONE TABLET BY MOUTH EVERY DAY  . atenolol (TENORMIN) 100 MG tablet Take 1 tablet (100 mg total) by mouth 2 (two) times daily.  Marland Kitchen atorvastatin (LIPITOR) 20 MG tablet Take 1 tablet (20 mg total) by mouth daily.  Marland Kitchen FLUoxetine (PROZAC) 40 MG capsule Take 1 capsule (40 mg total) by mouth daily.  Marland Kitchen gabapentin (NEURONTIN) 100 MG capsule TAKE ONE CAPSULE BY MOUTH EVERY MORNING, ONE CAPSULE AT NOON, THEN THREE CAPSULES AT BEDTIME  . loperamide (IMODIUM) 2 MG capsule TAKE ONE CAPSULE BY MOUTH FOUR TIMES A DAY AS NEEDED FOR DIARRHEA.  Marland Kitchen losartan (COZAAR) 100 MG tablet TAKE ONE TABLET BY MOUTH EVERY DAY.  . meclizine (ANTIVERT) 25 MG tablet Take one tablet (25 mg total) by mouth 3 (three) times a day as needed.  Marland Kitchen omeprazole (PRILOSEC) 40 MG capsule Take 1 capsule (40 mg total) by mouth daily.  Marland Kitchen oxyCODONE-acetaminophen (ROXICET) 5-325 MG tablet Take 1 tablet by mouth every 6 (six) hours as needed for severe pain. Ok to fill 06/24/17  . promethazine (PHENERGAN) 25 MG tablet Take 1 tablet (25 mg total) by mouth every 8 (eight) hours as needed for nausea or vomiting.  . [DISCONTINUED] oxyCODONE-acetaminophen (ROXICET) 5-325 MG tablet Take 1 tablet by mouth every 6 (six) hours as needed for severe pain.   . [DISCONTINUED] traZODone (DESYREL) 100 MG tablet Take 0.5-1 tablets (50-100 mg total) by mouth at bedtime as needed. for sleep  . mirabegron ER (MYRBETRIQ) 25 MG TB24 tablet Take 1 tablet (25 mg total) by mouth daily.  . zaleplon (SONATA) 5 MG capsule Take 1 capsule (5 mg total) by mouth at bedtime as needed for sleep.   No facility-administered encounter medications on file as of 06/14/2017.          Objective:  Physical Exam  Constitutional: She is oriented to person, place, and time. She appears well-developed and well-nourished.  HENT:  Head: Normocephalic and atraumatic.  Cardiovascular: Normal rate, regular rhythm and normal heart sounds.  Pulmonary/Chest: Effort normal and breath sounds normal.  Neurological: She is alert and oriented to person, place, and time.  Skin: Skin is warm and dry.  Psychiatric: She has a normal mood and affect. Her behavior is normal.        Assessment & Plan:  Chronic insomnia-failed treatment with Belsomra and with trazodone.  At this point we will try Sonata.  New prescription sent to pharmacy.  Call if any problems or side effects.  Mixed incontinence -we will see if her insurance will cover Myrbetriq.  New prescription sent for 25 mg.  If it is covered and she tolerates it well we can always adjust her dose if needed.  She did well on Vesicare but is no longer covered by her insurance plan.  Chronic pain management/chronic bilateral low back pain - will speak with Dr. Georgina Snell about ordering another injection.  I believe it was a facet injection.  We will also go ahead and refill her oxycodone today.  Follow-up in 2 months.  Neuropathy-continue current regimen with gabapentin.  HTN - well controlled.

## 2017-06-16 NOTE — Telephone Encounter (Signed)
Facet injection ordered.  Will need to contact [facility] radiology to arrange for this.

## 2017-06-16 NOTE — Telephone Encounter (Signed)
Called [facility] and advised them to call the patient to schedule patient. Rhonda Cunningham,CMA

## 2017-06-23 ENCOUNTER — Other Ambulatory Visit: Payer: Self-pay | Admitting: Family Medicine

## 2017-07-07 DIAGNOSIS — Z1231 Encounter for screening mammogram for malignant neoplasm of breast: Secondary | ICD-10-CM | POA: Diagnosis not present

## 2017-07-07 LAB — HM MAMMOGRAPHY

## 2017-07-12 ENCOUNTER — Other Ambulatory Visit: Payer: Self-pay | Admitting: Family Medicine

## 2017-07-12 ENCOUNTER — Ambulatory Visit
Admission: RE | Admit: 2017-07-12 | Discharge: 2017-07-12 | Disposition: A | Discharge status: Home / Self Care | Payer: Medicare Other | Source: Ambulatory Visit | Attending: Family Medicine | Admitting: Family Medicine

## 2017-07-12 DIAGNOSIS — M545 Low back pain, unspecified: Secondary | ICD-10-CM

## 2017-07-12 DIAGNOSIS — M47816 Spondylosis without myelopathy or radiculopathy, lumbar region: Secondary | ICD-10-CM

## 2017-07-12 DIAGNOSIS — G8929 Other chronic pain: Secondary | ICD-10-CM

## 2017-07-12 MED ORDER — METHYLPREDNISOLONE ACETATE 40 MG/ML INJ SUSP (RADIOLOG
80.0000 mg | Freq: Once | INTRAMUSCULAR | Status: AC
  Administered 2017-07-12: 80 mg via INTRA_ARTICULAR

## 2017-07-12 MED ORDER — FENTANYL CITRATE (PF) 100 MCG/2ML IJ SOLN: 25.0000 ug | INTRAMUSCULAR | Status: DC | PRN

## 2017-07-12 MED ORDER — SODIUM CHLORIDE 0.9 % IV SOLN
Freq: Once | INTRAVENOUS | Status: AC
  Administered 2017-07-12: 08:00:00 via INTRAVENOUS

## 2017-07-12 MED ORDER — MIDAZOLAM HCL 2 MG/2ML IJ SOLN: 1.0000 mg | INTRAMUSCULAR | Status: DC | PRN

## 2017-07-12 NOTE — Progress Notes (Signed)
Pt tolerated procedure well. Pt did not require pain medication or sedation. Pt reported only very minimal pain. Will continue to monitor.

## 2017-07-12 NOTE — Discharge Instructions (Signed)
Radio Frequency Ablation Post Procedure Discharge Instructions ° °1. May resume a regular diet and any medications that you routinely take (including pain medications). °2. No driving day of procedure. °3. Upon discharge go home and rest for at least 4 hours.  May use an ice pack as needed to injection sites on back. °4. Remove bandades later, today. ° ° ° °Please contact our office at [phone] for the following symptoms: ° °· Fever greater than 100 degrees °· Increased swelling, pain, or redness at injection site. ° ° °Thank you for visiting [facility]. °

## 2017-07-13 ENCOUNTER — Encounter: Payer: Self-pay | Admitting: Family Medicine

## 2017-07-16 ENCOUNTER — Other Ambulatory Visit: Payer: Self-pay | Admitting: Family Medicine

## 2017-07-26 ENCOUNTER — Other Ambulatory Visit: Payer: Self-pay | Admitting: Family Medicine

## 2017-07-26 DIAGNOSIS — F5101 Primary insomnia: Secondary | ICD-10-CM

## 2017-08-18 ENCOUNTER — Other Ambulatory Visit: Payer: Self-pay

## 2017-08-18 MED ORDER — OXYCODONE-ACETAMINOPHEN 5-325 MG PO TABS: 1.0000 | ORAL_TABLET | Freq: Four times a day (QID) | ORAL | 0 refills | Status: DC | PRN

## 2017-08-18 NOTE — Telephone Encounter (Signed)
Dang.

## 2017-08-18 NOTE — Telephone Encounter (Signed)
Annette Ramirez requests a refill on Oxycodone. She will call back to schedule a follow up.

## 2017-08-18 NOTE — Telephone Encounter (Signed)
PCP is out today. Patient will be out of medication on Sunday.

## 2017-08-18 NOTE — Telephone Encounter (Signed)
To PCP

## 2017-08-20 ENCOUNTER — Other Ambulatory Visit: Payer: Self-pay | Admitting: Family Medicine

## 2017-08-20 DIAGNOSIS — F5101 Primary insomnia: Secondary | ICD-10-CM

## 2017-08-22 ENCOUNTER — Other Ambulatory Visit: Payer: Self-pay | Admitting: Family Medicine

## 2017-08-25 DIAGNOSIS — J449 Chronic obstructive pulmonary disease, unspecified: Secondary | ICD-10-CM | POA: Diagnosis not present

## 2017-08-25 DIAGNOSIS — H539 Unspecified visual disturbance: Secondary | ICD-10-CM | POA: Diagnosis not present

## 2017-08-25 DIAGNOSIS — S060X0A Concussion without loss of consciousness, initial encounter: Secondary | ICD-10-CM | POA: Diagnosis not present

## 2017-08-30 DIAGNOSIS — S060X0A Concussion without loss of consciousness, initial encounter: Secondary | ICD-10-CM | POA: Diagnosis not present

## 2017-08-30 DIAGNOSIS — M47892 Other spondylosis, cervical region: Secondary | ICD-10-CM | POA: Diagnosis not present

## 2017-08-30 DIAGNOSIS — H538 Other visual disturbances: Secondary | ICD-10-CM | POA: Diagnosis not present

## 2017-08-30 DIAGNOSIS — H539 Unspecified visual disturbance: Secondary | ICD-10-CM | POA: Diagnosis not present

## 2017-08-30 DIAGNOSIS — S0990XA Unspecified injury of head, initial encounter: Secondary | ICD-10-CM | POA: Diagnosis not present

## 2017-08-30 DIAGNOSIS — M50322 Other cervical disc degeneration at C5-C6 level: Secondary | ICD-10-CM | POA: Diagnosis not present

## 2017-09-09 ENCOUNTER — Other Ambulatory Visit: Payer: Self-pay | Admitting: Family Medicine

## 2017-09-13 ENCOUNTER — Other Ambulatory Visit: Payer: Self-pay | Admitting: Family Medicine

## 2017-09-19 DIAGNOSIS — K591 Functional diarrhea: Secondary | ICD-10-CM | POA: Diagnosis not present

## 2017-09-28 ENCOUNTER — Other Ambulatory Visit: Payer: Self-pay | Admitting: Family Medicine

## 2017-09-29 ENCOUNTER — Encounter: Payer: Self-pay | Admitting: Family Medicine

## 2017-09-29 ENCOUNTER — Ambulatory Visit (INDEPENDENT_AMBULATORY_CARE_PROVIDER_SITE_OTHER): Payer: Medicare Other | Admitting: Family Medicine

## 2017-09-29 VITALS — BP 105/62 | HR 69 | Ht 62.01 in | Wt 135.0 lb

## 2017-09-29 DIAGNOSIS — G8929 Other chronic pain: Secondary | ICD-10-CM

## 2017-09-29 DIAGNOSIS — M545 Low back pain, unspecified: Secondary | ICD-10-CM

## 2017-09-29 DIAGNOSIS — L659 Nonscarring hair loss, unspecified: Secondary | ICD-10-CM

## 2017-09-29 DIAGNOSIS — K591 Functional diarrhea: Secondary | ICD-10-CM

## 2017-09-29 DIAGNOSIS — Z13220 Encounter for screening for lipoid disorders: Secondary | ICD-10-CM

## 2017-09-29 DIAGNOSIS — M47816 Spondylosis without myelopathy or radiculopathy, lumbar region: Secondary | ICD-10-CM | POA: Diagnosis not present

## 2017-09-29 DIAGNOSIS — F5101 Primary insomnia: Secondary | ICD-10-CM

## 2017-09-29 DIAGNOSIS — Z1321 Encounter for screening for nutritional disorder: Secondary | ICD-10-CM | POA: Diagnosis not present

## 2017-09-29 MED ORDER — OXYCODONE-ACETAMINOPHEN 5-325 MG PO TABS: 1.0000 | ORAL_TABLET | Freq: Four times a day (QID) | ORAL | 0 refills | Status: DC | PRN

## 2017-09-29 MED ORDER — QUETIAPINE FUMARATE 50 MG PO TABS: 25.0000 mg | ORAL_TABLET | Freq: Every day | ORAL | 1 refills | Status: DC

## 2017-09-29 MED ORDER — FLUOXETINE HCL 40 MG PO CAPS: 40.0000 mg | ORAL_CAPSULE | Freq: Every day | ORAL | 1 refills | Status: DC

## 2017-09-29 NOTE — Progress Notes (Signed)
Subjective:    CC: 8 week f/u  HPI:  Chronic pain management/chronic bilateral low back pain-last time I saw her she would referral to have a facet injection done again.  With that the information in an injection was performed on March 12.  She is on gabapentin and oxycodone twice a day.  Chronic insomnia-failed treatment with Belsomra and with trazodone.  We decided to try Sonata when I last saw her. Says the sonata is not working.  He says it does not even feel like she is taking anything at all.  She has been losing hair for about 6 weeks.  But she admits she also has not been eating very well.  She is been having chronic diarrhea and in fact has been to see GI and they have diagnosed with functional diarrhea.  She is been taking loperamide which helps but she just does not want to eat because even just a couple bites of any type of food seems to trigger the diarrhea and she gets watery bowel movements.  Ever since she has not been eating well her hair is been coming out.  She wonders if she starting to develop some type of deficiency.  Plus the GI has requested that we check a couple extra labs including checking for celiac, CMP, CBC, CRP and TSH.   Past medical history, Surgical history, Family history not pertinant except as noted below, Social history, Allergies, and medications have been entered into the medical record, reviewed, and corrections made.   Review of Systems: No fevers, chills, night sweats, weight loss, chest pain, or shortness of breath.   Objective:    General: Well Developed, well nourished, and in no acute distress.  Neuro: Alert and oriented x3, extra-ocular muscles intact, sensation grossly intact.  HEENT: Normocephalic, atraumatic  Skin: Warm and dry, no rashes.  He does have some thinning hair particularly on the top of her head. Cardiac: Regular rate and rhythm, no murmurs rubs or gallops, no lower extremity edema.  Respiratory: Clear to auscultation  bilaterally. Not using accessory muscles, speaking in full sentences.   Impression and Recommendations:    Chronic pain management/chronic bilateral low back pain  -continue current regimen.  Will refill pain medications.  To check the Norco on a registry and everything appears appropriate.  The facet injection did help some but not as much as the one previously.  Chronic diarrhea-we will go ahead and check CBC, CMP, celiac disease, CRP and TSH.  Will call with results once available.  Continue the loperamide as needed.  She has been scheduled for an abdominal ultrasound as well.   Indication for chronic opioid: chronic bilateral low back pain  -  Medication and dose: Oxycodone 09/03/2023, 1 tab twice a day as needed. # pills per month: 60 Last UDS date: 04/2017 Opioid Treatment Agreement signed (Y/N): Yes Opioid Treatment Agreement last reviewed with patient:  December NCCSRS reviewed this encounter (include red flags):  Yes, no red flags.    Insomnia-has failed trazodone, Sonata, and Belsomra.  I really do not think Rozerem is going to help.  At this point I think we should try a mood stabilizer so we will start with low-dose quetiapine at bedtime.  Hair loss-we will go ahead and check B12 and iron as well as the additional labs listed above.

## 2017-09-30 DIAGNOSIS — N281 Cyst of kidney, acquired: Secondary | ICD-10-CM | POA: Diagnosis not present

## 2017-09-30 DIAGNOSIS — K760 Fatty (change of) liver, not elsewhere classified: Secondary | ICD-10-CM | POA: Diagnosis not present

## 2017-10-03 ENCOUNTER — Other Ambulatory Visit: Payer: Self-pay | Admitting: Family Medicine

## 2017-10-03 LAB — CBC WITH DIFFERENTIAL/PLATELET
Basophils Absolute: 40 cells/uL (ref 0–200)
Basophils Relative: 0.6 %
EOS PCT: 7.7 %
Eosinophils Absolute: 516 cells/uL — ABNORMAL HIGH (ref 15–500)
HEMATOCRIT: 36.3 % (ref 35.0–45.0)
HEMOGLOBIN: 12.7 g/dL (ref 11.7–15.5)
LYMPHS ABS: 1729 {cells}/uL (ref 850–3900)
MCH: 32.8 pg (ref 27.0–33.0)
MCHC: 35 g/dL (ref 32.0–36.0)
MCV: 93.8 fL (ref 80.0–100.0)
MONOS PCT: 12.8 %
MPV: 11.3 fL (ref 7.5–12.5)
NEUTROS ABS: 3558 {cells}/uL (ref 1500–7800)
Neutrophils Relative %: 53.1 %
Platelets: 209 10*3/uL (ref 140–400)
RBC: 3.87 10*6/uL (ref 3.80–5.10)
RDW: 12.2 % (ref 11.0–15.0)
Total Lymphocyte: 25.8 %
WBC mixed population: 858 cells/uL (ref 200–950)
WBC: 6.7 10*3/uL (ref 3.8–10.8)

## 2017-10-03 LAB — COMPLETE METABOLIC PANEL WITH GFR
AG Ratio: 2.1 (calc) (ref 1.0–2.5)
ALKALINE PHOSPHATASE (APISO): 52 U/L (ref 33–130)
ALT: 9 U/L (ref 6–29)
AST: 15 U/L (ref 10–35)
Albumin: 4.5 g/dL (ref 3.6–5.1)
BUN: 15 mg/dL (ref 7–25)
CHLORIDE: 99 mmol/L (ref 98–110)
CO2: 23 mmol/L (ref 20–32)
Calcium: 9.7 mg/dL (ref 8.6–10.4)
Creat: 0.79 mg/dL (ref 0.60–0.93)
GFR, Est African American: 85 mL/min/{1.73_m2} (ref >=60)
GFR, Est Non African American: 74 mL/min/{1.73_m2} (ref >=60)
GLUCOSE: 91 mg/dL (ref 65–99)
Globulin: 2.1 g/dL (calc) (ref 1.9–3.7)
Potassium: 4.4 mmol/L (ref 3.5–5.3)
Sodium: 134 mmol/L — ABNORMAL LOW (ref 135–146)
TOTAL PROTEIN: 6.6 g/dL (ref 6.1–8.1)
Total Bilirubin: 0.9 mg/dL (ref 0.2–1.2)

## 2017-10-03 LAB — LIPID PANEL
CHOL/HDL RATIO: 2.3 (calc) (ref <5.0)
CHOLESTEROL: 173 mg/dL (ref <200)
HDL: 74 mg/dL (ref >50)
LDL Cholesterol (Calc): 77 mg/dL (calc)
Non-HDL Cholesterol (Calc): 99 mg/dL (calc) (ref <130)
Triglycerides: 134 mg/dL (ref <150)

## 2017-10-03 LAB — FERRITIN: FERRITIN: 230 ng/mL (ref 20–288)

## 2017-10-03 LAB — TISSUE TRANSGLUTAMINASE ABS,IGG,IGA
(TTG) AB, IGG: 1 U/mL
(tTG) Ab, IgA: 1 U/mL

## 2017-10-03 LAB — C-REACTIVE PROTEIN: CRP: 1.6 mg/L (ref <8.0)

## 2017-10-03 LAB — VITAMIN B12: Vitamin B-12: 307 pg/mL (ref 200–1100)

## 2017-10-03 LAB — TSH: TSH: 2.42 m[IU]/L (ref 0.40–4.50)

## 2017-10-03 LAB — IGA: IMMUNOGLOBULIN A: 206 mg/dL (ref 81–463)

## 2017-10-03 MED ORDER — GABAPENTIN 100 MG PO CAPS: ORAL_CAPSULE | ORAL | 3 refills | Status: DC

## 2017-10-03 NOTE — Progress Notes (Signed)
Gabapentin refilled for 90-day supply instead of 30-day supply.

## 2017-10-10 ENCOUNTER — Other Ambulatory Visit: Payer: Self-pay | Admitting: Family Medicine

## 2017-10-21 ENCOUNTER — Other Ambulatory Visit: Payer: Self-pay

## 2017-10-21 NOTE — Patient Outreach (Signed)
Annette Ramirez) Care Management  10/21/2017  Annette Ramirez 09-10-1945 [id number]   Medication Adherence call to Mrs. Annette Ramirez patient's telephone number under [place] is disconnected patient number under Epic is [phone] left a message for patient to call back patient is due on Atorvastatin 20 mg.Annette Ramirez is showing past due under [place]. Patient home number is disconnected [phone].   [facility] Direct Dial [phone]  Fax [phone] [email or web address]

## 2017-11-08 ENCOUNTER — Other Ambulatory Visit: Payer: Self-pay | Admitting: Family Medicine

## 2017-11-09 ENCOUNTER — Other Ambulatory Visit: Payer: Self-pay

## 2017-11-09 NOTE — Patient Outreach (Signed)
[facility]) Care Management  11/09/2017  ADELL KOVAL 04/01/1946 [id number]   Medication adherence call to Mrs. Nicosha Struve left a message for patient to call back patient is due on Atorvastatin 20 mg. Mrs. Pizzi is showing past due under [place] [employer].  [facility] Direct Dial [phone]  Fax [phone] [email or web address]

## 2017-11-19 ENCOUNTER — Other Ambulatory Visit: Payer: Self-pay | Admitting: Family Medicine

## 2017-11-24 ENCOUNTER — Other Ambulatory Visit: Payer: Self-pay | Admitting: Family Medicine

## 2017-11-24 ENCOUNTER — Other Ambulatory Visit: Payer: Self-pay

## 2017-11-24 DIAGNOSIS — L659 Nonscarring hair loss, unspecified: Secondary | ICD-10-CM

## 2017-11-24 DIAGNOSIS — K591 Functional diarrhea: Secondary | ICD-10-CM

## 2017-11-24 NOTE — Telephone Encounter (Signed)
She is supposed to f/u every 8 weeks. It was on her check out sheet very clearly. The same think happened when I saw her before. She was supposed to f/u in 8 week and didn't come back for 3 months.  She still hasn't made an appt.

## 2017-11-24 NOTE — Telephone Encounter (Signed)
Annette Ramirez called and request refill of Oxycodone. I advised her she was due for a follow up appointment. I transferred her to be scheduled.

## 2017-11-25 NOTE — Telephone Encounter (Signed)
Pt scheduled with PCP for 12/19/17, that was first available.

## 2017-11-25 NOTE — Telephone Encounter (Signed)
Called pt and advised of PCP recommendation. Transferred to scheduler.

## 2017-11-27 ENCOUNTER — Other Ambulatory Visit: Payer: Self-pay | Admitting: Family Medicine

## 2017-11-29 NOTE — Telephone Encounter (Signed)
Pt called wanting to know if she can have pain meds called in now that she has scheduled an OV.. I advised pt that since she has not been making her follow up appts like she should be, this cannot be refilled until she is seen.  Advised pt she can and check within the next several days and check to see if Dr Madilyn Fireman has any cancellations, pt declined

## 2017-12-12 ENCOUNTER — Other Ambulatory Visit: Payer: Self-pay | Admitting: Family Medicine

## 2017-12-19 ENCOUNTER — Encounter: Payer: Self-pay | Admitting: Family Medicine

## 2017-12-19 ENCOUNTER — Ambulatory Visit (INDEPENDENT_AMBULATORY_CARE_PROVIDER_SITE_OTHER): Payer: Medicare Other | Admitting: Family Medicine

## 2017-12-19 VITALS — BP 138/76 | HR 76 | Ht 62.0 in | Wt 139.0 lb

## 2017-12-19 DIAGNOSIS — L659 Nonscarring hair loss, unspecified: Secondary | ICD-10-CM

## 2017-12-19 DIAGNOSIS — F5101 Primary insomnia: Secondary | ICD-10-CM | POA: Diagnosis not present

## 2017-12-19 DIAGNOSIS — M545 Low back pain, unspecified: Secondary | ICD-10-CM

## 2017-12-19 DIAGNOSIS — Z23 Encounter for immunization: Secondary | ICD-10-CM | POA: Diagnosis not present

## 2017-12-19 DIAGNOSIS — I10 Essential (primary) hypertension: Secondary | ICD-10-CM | POA: Diagnosis not present

## 2017-12-19 DIAGNOSIS — G8929 Other chronic pain: Secondary | ICD-10-CM | POA: Diagnosis not present

## 2017-12-19 NOTE — Progress Notes (Signed)
Subjective:    Patient ID: Annette Ramirez, female    DOB: 04-17-1946, 72 y.o.   MRN: [id number]  HPI 72 year old female is here today for chronic pain management/chronic bilateral low back pain-she was actually getting some injections done on the time I last saw her.  She is currently on gabapentin and oxycodone twice a day.  F/U chronic insomnia -she is Artie tried and failed trazodone, Sonata, Belsomra and I believe Ambien as well.  So we discussed trying to use a low-dose mood stabilizer.  She started out with 1 tab and is now up to 2 tabs and says that has been helping some.  She denies any excess sedation or grogginess.  Hypertension- Pt denies chest pain, SOB, dizziness, or heart palpitations.  Taking meds as directed w/o problems.  Denies medication side effects.    She still very concerned about hair loss.  When I last saw her we did some additional labs just to rule out deficiencies.  The only thing noted was that her eosinophils were mildly elevated.  But her B12 and iron stores look great.  Review of Systems  BP 138/76   Pulse 76   Ht 5\' 2"  (1.575 m)   Wt 139 lb (63 kg)   SpO2 96%   BMI 25.42 kg/m     Allergies  Allergen Reactions  . Amoxicillin-Pot Clavulanate     REACTION: nausea and abdominal pain  . Aspirin     REACTION: upset stomach  . Codeine     REACTION: nausea  . Hctz [Hydrochlorothiazide] Other (See Comments)    Hyponatremia  . Nsaids Nausea Only    Past Medical History:  Diagnosis Date  . Anxiety   . Asthma   . COPD (chronic obstructive pulmonary disease) ([place])   . Depression   . Exertional shortness of breath   . Family history of anesthesia complication    "granddaughter has PONV" (02/28/2013)  . Fibromyalgia   . GERD (gastroesophageal reflux disease)   . H/O hiatal hernia   . Hypertension   . Migraines    "in the past; they've stopped" (02/28/2013)  . Opiate use 06/06/2017  . Osteoporosis   . PONV (postoperative nausea and vomiting)    . Sinus headache     Past Surgical History:  Procedure Laterality Date  . ABDOMINAL HYSTERECTOMY  1975  . APPENDECTOMY  1975  . BREAST CYST ASPIRATION Left 1980's  . COLONOSCOPY W/ BIOPSIES AND POLYPECTOMY  2012  . DILATION AND CURETTAGE OF UTERUS  1960's  . EXCISIONAL HEMORRHOIDECTOMY  1980's  . LUMBAR LAMINECTOMY  02/28/2013  . LUMBAR LAMINECTOMY/DECOMPRESSION MICRODISCECTOMY N/A 02/28/2013   Procedure: LUMBAR LAMINECTOMY/DECOMPRESSION MICRODISCECTOMY/Lumbar 4-5 decompression;  Surgeon: Sinclair Ship, MD;  Location: [place];  Service: Orthopedics;  Laterality: N/A;  Lumbar 4-5 decompression  . TONSILLECTOMY  1950  . TUBAL LIGATION  1972    Social History   Socioeconomic History  . Marital status: Married    Spouse name: Not on file  . Number of children: Not on file  . Years of education: Not on file  . Highest education level: Not on file  Occupational History  . Not on file  Social Needs  . Financial resource strain: Not on file  . Food insecurity:    Worry: Not on file    Inability: Not on file  . Transportation needs:    Medical: Not on file    Non-medical: Not on file  Tobacco Use  . Smoking status: Current  Some Day Smoker    Packs/day: 0.25    Years: 40.00    Pack years: 10.00    Types: Cigarettes  . Smokeless tobacco: Never Used  . Tobacco comment: 02/28/2013 "started smoking eletronic cigarettes a couple years ago"  Substance and Sexual Activity  . Alcohol use: Yes    Alcohol/week: 5.0 standard drinks    Types: 5 Glasses of wine per week  . Drug use: No  . Sexual activity: Never  Lifestyle  . Physical activity:    Days per week: Not on file    Minutes per session: Not on file  . Stress: Not on file  Relationships  . Social connections:    Talks on phone: Not on file    Gets together: Not on file    Attends religious service: Not on file    Active member of club or organization: Not on file    Attends meetings of clubs or organizations:  Not on file    Relationship status: Not on file  . Intimate partner violence:    Fear of current or ex partner: Not on file    Emotionally abused: Not on file    Physically abused: Not on file    Forced sexual activity: Not on file  Other Topics Concern  . Not on file  Social History Narrative  . Not on file    Family History  Problem Relation Age of Onset  . Alzheimer's disease Mother   . Prostate cancer Unknown        grandfather  . Stroke Unknown        grandparent    Outpatient Encounter Medications as of 12/19/2017  Medication Sig  . albuterol (PROVENTIL HFA;VENTOLIN HFA) 108 (90 Base) MCG/ACT inhaler Inhale 2 puffs into the lungs every 6 (six) hours as needed for wheezing.  Marland Kitchen amitriptyline (ELAVIL) 25 MG tablet Take 25 mg by mouth at bedtime.  Marland Kitchen amLODipine (NORVASC) 5 MG tablet TAKE ONE TABLET BY MOUTH EVERY DAY  . atenolol (TENORMIN) 100 MG tablet TAKE 1 TABLET BY MOUTH TWO  TIMES DAILY  . atorvastatin (LIPITOR) 20 MG tablet TAKE 1 TABLET BY MOUTH  DAILY.  Marland Kitchen FLUoxetine (PROZAC) 40 MG capsule Take 1 capsule (40 mg total) by mouth daily.  Marland Kitchen gabapentin (NEURONTIN) 100 MG capsule TAKE ONE CAPSULE BY MOUTH EVERY MORNING, ONE CAPSULE AT NOON, THEN THREE CAPSULES AT BEDTIME  . loperamide (IMODIUM) 2 MG capsule TAKE ONE CAPSULE BY MOUTH FOUR TIMES A DAY AS NEEDED FOR DIARRHEA.  Marland Kitchen losartan (COZAAR) 100 MG tablet TAKE ONE TABLET BY MOUTH EVERY DAY.  . meclizine (ANTIVERT) 25 MG tablet Take one tablet (25 mg total) by mouth 3 (three) times a day as needed.  Marland Kitchen MYRBETRIQ 25 MG TB24 tablet Take 1 tablet (25 mg total) by mouth daily.  Marland Kitchen omeprazole (PRILOSEC) 40 MG capsule TAKE 1 CAPSULE BY MOUTH  DAILY  . oxyCODONE-acetaminophen (ROXICET) 5-325 MG tablet Take 1 tablet by mouth every 6 (six) hours as needed for severe pain.  . promethazine (PHENERGAN) 25 MG tablet Take 1 tablet (25 mg total) by mouth every 8 (eight) hours as needed for nausea or vomiting.  Marland Kitchen QUEtiapine (SEROQUEL) 50 MG  tablet Take 0.5-2 tablets (25-100 mg total) by mouth at bedtime.  . [DISCONTINUED] zaleplon (SONATA) 5 MG capsule Take 1 capsule (5 mg total) by mouth at bedtime as needed for sleep.   No facility-administered encounter medications on file as of 12/19/2017.  Objective:   Physical Exam  Constitutional: She is oriented to person, place, and time. She appears well-developed and well-nourished.  HENT:  Head: Normocephalic and atraumatic.  Cardiovascular: Normal rate, regular rhythm and normal heart sounds.  Pulmonary/Chest: Effort normal and breath sounds normal.  Neurological: She is alert and oriented to person, place, and time.  Skin: Skin is warm and dry.  Psychiatric: She has a normal mood and affect. Her behavior is normal.         Assessment & Plan:  Chronic pain management/chronic bilateral low back pain -continue current regimen.  Will refill pain medications.  Contract updated today.  Indication for chronic opioid: chronic bilateral low back pain  Medication and dose: Oxycodone 09/03/2023, 1 tab twice a day as needed. # pills per month: 60 Last UDS date: Update today.  Opioid Treatment Agreement signed (Y/N): Y Opioid Treatment Agreement last reviewed with patient:  Y NCCSRS reviewed this encounter (include red flags):  Y, no red flags.    HTN - Well controlled. Continue current regimen. Follow up in  72months  Chronic insomnia -we will change Zoloft 200 mg daily.  If she is still having problems give me a call back we can adjust the dose if needed.  Hair loss unclear etiology.  We discussed that it could be a potential medication side effect though none of her medications really are new.  In the hair loss started before we started the quetiapine.  Only if she wants to hold her statin for a month just to see if that makes a difference she can.  Also she is on arm which can cause hair loss as well as gabapentin.

## 2017-12-20 ENCOUNTER — Other Ambulatory Visit: Payer: Self-pay | Admitting: Family Medicine

## 2017-12-20 DIAGNOSIS — G8929 Other chronic pain: Secondary | ICD-10-CM

## 2017-12-20 DIAGNOSIS — K591 Functional diarrhea: Secondary | ICD-10-CM

## 2017-12-20 DIAGNOSIS — M545 Low back pain, unspecified: Secondary | ICD-10-CM

## 2017-12-20 DIAGNOSIS — L659 Nonscarring hair loss, unspecified: Secondary | ICD-10-CM

## 2017-12-20 NOTE — Telephone Encounter (Signed)
Routing to pcp for signature...Jessalynn Mccowan Lynetta  

## 2017-12-22 LAB — PAIN MGMT, OPIATES EXP. QN, UR
Codeine: NEGATIVE ng/mL (ref <50)
Hydrocodone: NEGATIVE ng/mL (ref <50)
Hydromorphone: NEGATIVE ng/mL (ref <50)
MORPHINE: NEGATIVE ng/mL (ref <50)
NOROXYCODONE: 2800 ng/mL — AB (ref <50)
Norhydrocodone: NEGATIVE ng/mL (ref <50)
OXYMORPHONE: 69 ng/mL — AB (ref <50)
Oxycodone: 1447 ng/mL — ABNORMAL HIGH (ref <50)

## 2018-01-09 ENCOUNTER — Other Ambulatory Visit: Payer: Self-pay | Admitting: Family Medicine

## 2018-02-01 ENCOUNTER — Ambulatory Visit (INDEPENDENT_AMBULATORY_CARE_PROVIDER_SITE_OTHER): Payer: Medicare Other | Admitting: Family Medicine

## 2018-02-01 ENCOUNTER — Encounter: Payer: Self-pay | Admitting: Family Medicine

## 2018-02-01 VITALS — BP 136/79 | HR 70 | Ht 62.0 in | Wt 139.0 lb

## 2018-02-01 DIAGNOSIS — Z23 Encounter for immunization: Secondary | ICD-10-CM | POA: Diagnosis not present

## 2018-02-01 DIAGNOSIS — G8929 Other chronic pain: Secondary | ICD-10-CM

## 2018-02-01 DIAGNOSIS — L659 Nonscarring hair loss, unspecified: Secondary | ICD-10-CM

## 2018-02-01 DIAGNOSIS — M545 Low back pain, unspecified: Secondary | ICD-10-CM

## 2018-02-01 MED ORDER — QUETIAPINE FUMARATE 100 MG PO TABS: 100.0000 mg | ORAL_TABLET | Freq: Every day | ORAL | 1 refills | Status: DC

## 2018-02-01 MED ORDER — OXYCODONE-ACETAMINOPHEN 5-325 MG PO TABS: 1.0000 | ORAL_TABLET | Freq: Four times a day (QID) | ORAL | 0 refills | Status: DC | PRN

## 2018-02-01 NOTE — Progress Notes (Signed)
Subjective:    Patient ID: Annette Ramirez, female    DOB: [birth year], 72 y.o.   MRN: [id number]  HPI F/U chronic pain management -he is doing okay on her regimen and she is here today for refills on her oxycodone.  She says her back pain is always worse first thing in the morning but she did just get a new mattress yesterday so she is actually excited and hoping that it will help somewhat with her pain some days it is unbearable.  She denies any side effects of the medication.  She was actually supposed to come in next month but somehow the schedule was messed up and she is here today early.  F/U insomnia - she is doing well on her Seroquel.  Really takes 2 tabs every night but this does seem to be working and making a big difference for her.  Hair loss-last time I saw her we discussed actually holding her statin for a month just to see if she feels like it made a difference.  She says she really did not notice any change.  It also done some additional labs which were all normal.  Review of Systems  BP 136/79   Pulse 70   Ht 5\' 2"  (1.575 m)   Wt 139 lb (63 kg)   SpO2 99%   BMI 25.42 kg/m     Allergies  Allergen Reactions  . Amoxicillin-Pot Clavulanate     REACTION: nausea and abdominal pain  . Aspirin     REACTION: upset stomach  . Codeine     REACTION: nausea  . Hctz [Hydrochlorothiazide] Other (See Comments)    Hyponatremia  . Nsaids Nausea Only    Past Medical History:  Diagnosis Date  . Anxiety   . Asthma   . COPD (chronic obstructive pulmonary disease) ([place])   . Depression   . Exertional shortness of breath   . Family history of anesthesia complication    "granddaughter has PONV" (02/28/2013)  . Fibromyalgia   . GERD (gastroesophageal reflux disease)   . H/O hiatal hernia   . Hypertension   . Migraines    "in the past; they've stopped" (02/28/2013)  . Opiate use 06/06/2017  . Osteoporosis   . PONV (postoperative nausea and vomiting)   . Sinus headache      Past Surgical History:  Procedure Laterality Date  . ABDOMINAL HYSTERECTOMY  1975  . APPENDECTOMY  1975  . BREAST CYST ASPIRATION Left 1980's  . COLONOSCOPY W/ BIOPSIES AND POLYPECTOMY  2012  . DILATION AND CURETTAGE OF UTERUS  1960's  . EXCISIONAL HEMORRHOIDECTOMY  1980's  . LUMBAR LAMINECTOMY  02/28/2013  . LUMBAR LAMINECTOMY/DECOMPRESSION MICRODISCECTOMY N/A 02/28/2013   Procedure: LUMBAR LAMINECTOMY/DECOMPRESSION MICRODISCECTOMY/Lumbar 4-5 decompression;  Surgeon: Sinclair Ship, MD;  Location: [facility];  Service: Orthopedics;  Laterality: N/A;  Lumbar 4-5 decompression  . TONSILLECTOMY  1950  . TUBAL LIGATION  1972    Social History   Socioeconomic History  . Marital status: Married    Spouse name: Not on file  . Number of children: Not on file  . Years of education: Not on file  . Highest education level: Not on file  Occupational History  . Not on file  Social Needs  . Financial resource strain: Not on file  . Food insecurity:    Worry: Not on file    Inability: Not on file  . Transportation needs:    Medical: Not on file    Non-medical: Not  on file  Tobacco Use  . Smoking status: Current Some Day Smoker    Packs/day: 0.25    Years: 40.00    Pack years: 10.00    Types: Cigarettes  . Smokeless tobacco: Never Used  . Tobacco comment: 02/28/2013 "started smoking eletronic cigarettes a couple years ago"  Substance and Sexual Activity  . Alcohol use: Yes    Alcohol/week: 5.0 standard drinks    Types: 5 Glasses of wine per week  . Drug use: No  . Sexual activity: Never  Lifestyle  . Physical activity:    Days per week: Not on file    Minutes per session: Not on file  . Stress: Not on file  Relationships  . Social connections:    Talks on phone: Not on file    Gets together: Not on file    Attends religious service: Not on file    Active member of club or organization: Not on file    Attends meetings of clubs or organizations: Not on file     Relationship status: Not on file  . Intimate partner violence:    Fear of current or ex partner: Not on file    Emotionally abused: Not on file    Physically abused: Not on file    Forced sexual activity: Not on file  Other Topics Concern  . Not on file  Social History Narrative  . Not on file    Family History  Problem Relation Age of Onset  . Alzheimer's disease Mother   . Prostate cancer Unknown        grandfather  . Stroke Unknown        grandparent    Outpatient Encounter Medications as of 02/01/2018  Medication Sig  . albuterol (PROVENTIL HFA;VENTOLIN HFA) 108 (90 Base) MCG/ACT inhaler Inhale 2 puffs into the lungs every 6 (six) hours as needed for wheezing.  Marland Kitchen amitriptyline (ELAVIL) 25 MG tablet Take 25 mg by mouth at bedtime.  Marland Kitchen amLODipine (NORVASC) 5 MG tablet TAKE ONE TABLET BY MOUTH EVERY DAY  . atenolol (TENORMIN) 100 MG tablet TAKE 1 TABLET BY MOUTH TWO  TIMES DAILY  . atorvastatin (LIPITOR) 20 MG tablet TAKE 1 TABLET BY MOUTH  DAILY.  Marland Kitchen FLUoxetine (PROZAC) 40 MG capsule Take 1 capsule (40 mg total) by mouth daily.  Marland Kitchen gabapentin (NEURONTIN) 100 MG capsule TAKE ONE CAPSULE BY MOUTH EVERY MORNING, ONE CAPSULE AT NOON, THEN THREE CAPSULES AT BEDTIME  . loperamide (IMODIUM) 2 MG capsule TAKE ONE CAPSULE BY MOUTH FOUR TIMES A DAY AS NEEDED FOR DIARRHEA.  Marland Kitchen losartan (COZAAR) 100 MG tablet TAKE ONE TABLET BY MOUTH EVERY DAY.  . meclizine (ANTIVERT) 25 MG tablet Take one tablet (25 mg total) by mouth 3 (three) times a day as needed.  Marland Kitchen MYRBETRIQ 25 MG TB24 tablet Take 1 tablet (25 mg total) by mouth daily.  Marland Kitchen omeprazole (PRILOSEC) 40 MG capsule TAKE 1 CAPSULE BY MOUTH  DAILY  . oxyCODONE-acetaminophen (PERCOCET/ROXICET) 5-325 MG tablet Take 1 tablet by mouth every 6 (six) hours as needed. for pain  . promethazine (PHENERGAN) 25 MG tablet Take 1 tablet (25 mg total) by mouth every 8 (eight) hours as needed for nausea or vomiting.  Marland Kitchen QUEtiapine (SEROQUEL) 100 MG tablet Take 1  tablet (100 mg total) by mouth at bedtime.  . [DISCONTINUED] oxyCODONE-acetaminophen (PERCOCET/ROXICET) 5-325 MG tablet TAKE ONE TABLET BY MOUTH EVERY 6 HOURS AS NEEDED FOR PAIN  . [DISCONTINUED] QUEtiapine (SEROQUEL) 50 MG tablet Take 1/2  to 2 tablets (25-100 mg total) by mouth at bedtime.   No facility-administered encounter medications on file as of 02/01/2018.          Objective:   Physical Exam  Constitutional: She is oriented to person, place, and time. She appears well-developed and well-nourished.  HENT:  Head: Normocephalic and atraumatic.  Cardiovascular: Normal rate, regular rhythm and normal heart sounds.  Pulmonary/Chest: Effort normal and breath sounds normal.  Musculoskeletal:  Hip, Knee, ankle strength is 5 out of 5.  Though she does have a tremor with activity.  Patellar reflexes 2+.  Neurological: She is alert and oriented to person, place, and time.  Skin: Skin is warm and dry.  Psychiatric: She has a normal mood and affect. Her behavior is normal.        Assessment & Plan:  Chronic pain management/chronic bilateral low back pain-continue current regimen. Will refill pain medications.  Discussed the importance of just continuing to work on strengthening and exercising of her lower extremities.  Aloe up in 3 months.  Indication for chronic opioid: chronic bilateral low back pain Medication and dose: Oxycodone 09/03/2023, 1 tab twice a day as needed. # pills per month:60 Last UDS date: Update today.  Opioid Treatment Agreement signed (Y/N): Y Opioid Treatment Agreement last reviewed with patient:  Y NCCSRS reviewed this encounter (include red flags):  Y, no red flags.    Chronic insomnia-currently taking 100 mg of quetiapine.  Sent over new prescription so that she does not have to take 2 of the 50 mg.  Follow-up in 6 months.  Hair loss -we discussed options including Rogaine for women.  She will check on the present for debility and check and see if her  insurance might also covered as well but it is over-the-counter now.  Discussed the possibility of referral to dermatology.  She will think about it and let me know.

## 2018-02-16 ENCOUNTER — Telehealth: Payer: Self-pay | Admitting: Family Medicine

## 2018-02-16 DIAGNOSIS — L659 Nonscarring hair loss, unspecified: Secondary | ICD-10-CM

## 2018-02-16 NOTE — Telephone Encounter (Signed)
Annette Ramirez requested a referral to a dermatologist due to the recent hair loss. [name] area)  Please Advise.

## 2018-02-17 NOTE — Telephone Encounter (Signed)
Referral for dermatology placed. Maryruth Eve, Lahoma Crocker, CMA

## 2018-02-17 NOTE — Telephone Encounter (Signed)
Sent to [facility]

## 2018-03-08 DIAGNOSIS — L603 Nail dystrophy: Secondary | ICD-10-CM | POA: Diagnosis not present

## 2018-03-08 DIAGNOSIS — L650 Telogen effluvium: Secondary | ICD-10-CM | POA: Diagnosis not present

## 2018-03-14 ENCOUNTER — Other Ambulatory Visit: Payer: Self-pay | Admitting: Family Medicine

## 2018-03-14 DIAGNOSIS — K591 Functional diarrhea: Secondary | ICD-10-CM

## 2018-03-14 DIAGNOSIS — L659 Nonscarring hair loss, unspecified: Secondary | ICD-10-CM

## 2018-03-22 ENCOUNTER — Other Ambulatory Visit: Payer: Self-pay

## 2018-03-22 NOTE — Patient Outreach (Signed)
[facility]) Care Management  03/22/2018  Annette Ramirez 11-05-1945 [id number]      Medication Adherence call to Mrs. Annette Ramirez spoke with patient she is due on Losartan 100 mg she explain she had stop taking the medication because her hair was falling but now she is taking it again.patient has medication At this time. Mrs. Annette Ramirez is showing past due under [place].    [facility] Management Direct Dial [phone]  Fax [phone] [email or web address]

## 2018-04-03 ENCOUNTER — Other Ambulatory Visit: Payer: Self-pay

## 2018-04-03 DIAGNOSIS — M545 Low back pain, unspecified: Secondary | ICD-10-CM

## 2018-04-03 DIAGNOSIS — G8929 Other chronic pain: Secondary | ICD-10-CM

## 2018-04-03 MED ORDER — OXYCODONE-ACETAMINOPHEN 5-325 MG PO TABS: 1.0000 | ORAL_TABLET | Freq: Four times a day (QID) | ORAL | 0 refills | Status: DC | PRN

## 2018-04-03 NOTE — Telephone Encounter (Signed)
Annette Ramirez requests a refill on Oxycodone.

## 2018-05-04 ENCOUNTER — Ambulatory Visit: Payer: Medicare Other | Admitting: Family Medicine

## 2018-05-18 ENCOUNTER — Ambulatory Visit (INDEPENDENT_AMBULATORY_CARE_PROVIDER_SITE_OTHER): Payer: Medicare Other | Admitting: Family Medicine

## 2018-05-18 ENCOUNTER — Encounter: Payer: Self-pay | Admitting: Family Medicine

## 2018-05-18 VITALS — BP 108/69 | HR 61 | Ht 62.0 in | Wt 144.0 lb

## 2018-05-18 DIAGNOSIS — M545 Low back pain, unspecified: Secondary | ICD-10-CM

## 2018-05-18 DIAGNOSIS — F1190 Opioid use, unspecified, uncomplicated: Secondary | ICD-10-CM

## 2018-05-18 DIAGNOSIS — I700 Atherosclerosis of aorta: Secondary | ICD-10-CM | POA: Diagnosis not present

## 2018-05-18 DIAGNOSIS — G8929 Other chronic pain: Secondary | ICD-10-CM | POA: Diagnosis not present

## 2018-05-18 DIAGNOSIS — I10 Essential (primary) hypertension: Secondary | ICD-10-CM

## 2018-05-18 LAB — COMPLETE METABOLIC PANEL WITH GFR
AG Ratio: 2.1 (calc) (ref 1.0–2.5)
ALBUMIN MSPROF: 4.5 g/dL (ref 3.6–5.1)
ALT: 9 U/L (ref 6–29)
AST: 16 U/L (ref 10–35)
Alkaline phosphatase (APISO): 59 U/L (ref 33–130)
BUN: 17 mg/dL (ref 7–25)
CALCIUM: 9.5 mg/dL (ref 8.6–10.4)
CO2: 30 mmol/L (ref 20–32)
CREATININE: 0.71 mg/dL (ref 0.60–0.93)
Chloride: 98 mmol/L (ref 98–110)
GFR, EST AFRICAN AMERICAN: 97 mL/min/{1.73_m2} (ref >=60)
GFR, EST NON AFRICAN AMERICAN: 84 mL/min/{1.73_m2} (ref >=60)
GLOBULIN: 2.1 g/dL (ref 1.9–3.7)
Glucose, Bld: 101 mg/dL — ABNORMAL HIGH (ref 65–99)
Potassium: 4.5 mmol/L (ref 3.5–5.3)
SODIUM: 135 mmol/L (ref 135–146)
TOTAL PROTEIN: 6.6 g/dL (ref 6.1–8.1)
Total Bilirubin: 1 mg/dL (ref 0.2–1.2)

## 2018-05-18 MED ORDER — OXYCODONE-ACETAMINOPHEN 5-325 MG PO TABS: 1.0000 | ORAL_TABLET | Freq: Four times a day (QID) | ORAL | 0 refills | Status: DC | PRN

## 2018-05-18 NOTE — Progress Notes (Signed)
Subjective:    CC:   HPI: F/U chronic pain management -he is doing okay on her regimen and she is here today for refills on her oxycodone. Is a little more active over the holidays and so has had a little bit more pain but things seem to be settling down.  She reports that her anxiety levels were also up a little bit over the holidays.  Sclerosis of the aorta-she is currently on Lipitor 20 mg daily tolerating well without any side effects or problems.  No chest pain or shortness of breath.  COPD-stable she has not had any recent flares or exacerbations since I last saw here.  Just uses her albuterol as needed.  OPD-stable.  No recent flares.  Hypertension- Pt denies chest pain, SOB, dizziness, or heart palpitations.  Taking meds as directed w/o problems.  Denies medication side effects.      Past medical history, Surgical history, Family history not pertinant except as noted below, Social history, Allergies, and medications have been entered into the medical record, reviewed, and corrections made.   Review of Systems: No fevers, chills, night sweats, weight loss, chest pain, or shortness of breath.   Objective:    General: Well Developed, well nourished, and in no acute distress.  Neuro: Alert and oriented x3, extra-ocular muscles intact, sensation grossly intact.  HEENT: Normocephalic, atraumatic.no cervical lymphadenopathy. Skin: Warm and dry, no rashes. Cardiac: Regular rate and rhythm, no murmurs rubs or gallops, no lower extremity edema.  Respiratory: Clear to auscultation bilaterally. Not using accessory muscles, speaking in full sentences.   Impression and Recommendations:    F/U chronic pain management -she is stable on her current regimen.  She did get a new mattress and that has helped some. UDS performed today.   Indication for chronic opioid:chronic bilateral low back pain Medication and dose:Oxycodone 09/03/2023, 1 tab twice a day as needed. # pills per  month:60 Last UDS date:12/26/17 Opioid Treatment Agreement signed (Y/N):Y Opioid Treatment Agreement last reviewed with patient: NCCSRS reviewed this encounter (include red flags):No   COPD-stable.  No recent flares or exacerbations.  Continue as needed albuterol.  Hypertension-controlled.  Continue current regimen.  Due for CMP today.

## 2018-05-19 NOTE — Progress Notes (Signed)
All labs are normal. 

## 2018-05-20 LAB — PAIN MGMT, PROFILE 5 W/O MEDMATCH U
Amphetamines: NEGATIVE ng/mL (ref <500)
Barbiturates: NEGATIVE ng/mL (ref <300)
Benzodiazepines: NEGATIVE ng/mL (ref <100)
Cocaine Metabolite: NEGATIVE ng/mL (ref <150)
Codeine: NEGATIVE ng/mL (ref <50)
Creatinine: 135.4 mg/dL
Hydrocodone: NEGATIVE ng/mL (ref <50)
Hydromorphone: NEGATIVE ng/mL (ref <50)
Marijuana Metabolite: NEGATIVE ng/mL (ref <20)
Methadone Metabolite: NEGATIVE ng/mL (ref <100)
Morphine: NEGATIVE ng/mL (ref <50)
Norhydrocodone: NEGATIVE ng/mL (ref <50)
Noroxycodone: 2560 ng/mL — ABNORMAL HIGH (ref <50)
Opiates: NEGATIVE ng/mL (ref <100)
Oxidant: NEGATIVE ug/mL (ref <200)
Oxycodone: 1034 ng/mL — ABNORMAL HIGH (ref <50)
Oxycodone: POSITIVE ng/mL — AB (ref <100)
Oxymorphone: NEGATIVE ng/mL (ref <50)
pH: 6.91 (ref 4.5–9.0)

## 2018-06-14 ENCOUNTER — Other Ambulatory Visit: Payer: Self-pay

## 2018-06-14 MED ORDER — MIRABEGRON ER 25 MG PO TB24: ORAL_TABLET | ORAL | 1 refills | Status: DC

## 2018-06-22 ENCOUNTER — Other Ambulatory Visit: Payer: Self-pay | Admitting: Family Medicine

## 2018-07-06 NOTE — Progress Notes (Signed)
Subjective:   Annette Ramirez is a 73 y.o. female who presents for Medicare Annual (Subsequent) preventive examination.  Review of Systems:  No ROS.  Medicare Wellness Visit. Additional risk factors are reflected in the social history.  Cardiac Risk Factors include: advanced age (>77men, >69 women);smoking/ tobacco exposure;hypertension Sleep patterns: Getting on average 6 hours of sleep a night. Wakes up 1 time to void. Wakes up feeling "groggy". Patient is on something to help with sleep but states that its not working. Home Safety/Smoke Alarms: Feels safe in home. Smoke alarms in place.  Living environment; LIves at home with husband in 1 story home, steps into the home have a handrail in place. Shower is a walk in shower with grab bars in place. Seat Belt Safety/Bike Helmet: Wears seat belt.   Female:   Pap-   Aged out    Mammo-  utd    Dexa scan-   Declines exam     CCS- utd      Objective:     Vitals: BP (!) 101/57 (BP Location: Left Arm, Patient Position: Sitting, Cuff Size: Normal)   Pulse 71   Ht 5\' 2"  (1.575 m)   Wt 147 lb (66.7 kg)   SpO2 96%   BMI 26.89 kg/m   Body mass index is 26.89 kg/m.  Advanced Directives 07/11/2018 10/01/2013 02/28/2013 02/28/2013 02/22/2013  Does Patient Have a Medical Advance Directive? Yes Patient has advance directive, copy not in chart - Patient has advance directive, copy not in chart Patient has advance directive, copy not in chart  Type of Advance Directive [place];Living will [place];Living will Advance instruction for mental health [place];Living will  Does patient want to make changes to medical advance directive? No - Patient declined - - - -  Copy of [place] in Chart? No - copy requested - - - -  Pre-existing out of facility DNR order (yellow form or pink MOST form) - - - - No    Tobacco Social History   Tobacco Use  Smoking  Status Current Some Day Smoker  . Packs/day: 0.25  . Years: 40.00  . Pack years: 10.00  . Types: Cigarettes  Smokeless Tobacco Never Used  Tobacco Comment   02/28/2013 "started smoking eletronic cigarettes a couple years ago"     Ready to quit: No Counseling given: Not Answered Comment: 02/28/2013 "started smoking eletronic cigarettes a couple years ago"   Clinical Intake:  Pre-visit preparation completed: Yes  Pain : 0-10 Pain Score: 5  Pain Type: Chronic pain Pain Location: Back Pain Orientation: Lower Pain Descriptors / Indicators: Aching, Throbbing Pain Onset: More than a month ago Pain Frequency: Intermittent Pain Relieving Factors: pain meds Effect of Pain on Daily Activities: none  Pain Relieving Factors: pain meds  Nutritional Risks: None Diabetes: No  How often do you need to have someone help you when you read instructions, pamphlets, or other written materials from your doctor or pharmacy?: 1 - Never What is the last grade level you completed in school?: 13  Interpreter Needed?: No  Information entered by :: Orlie Dakin, LPN  Past Medical History:  Diagnosis Date  . Anxiety   . Asthma   . COPD (chronic obstructive pulmonary disease) ([place])   . Depression   . Exertional shortness of breath   . Family history of anesthesia complication    "granddaughter has PONV" (02/28/2013)  . Fibromyalgia   . GERD (  gastroesophageal reflux disease)   . H/O hiatal hernia   . Hypertension   . Migraines    "in the past; they've stopped" (02/28/2013)  . Opiate use 06/06/2017  . Osteoporosis   . PONV (postoperative nausea and vomiting)   . Sinus headache    Past Surgical History:  Procedure Laterality Date  . ABDOMINAL HYSTERECTOMY  1975  . APPENDECTOMY  1975  . BREAST CYST ASPIRATION Left 1980's  . COLONOSCOPY W/ BIOPSIES AND POLYPECTOMY  2012  . DILATION AND CURETTAGE OF UTERUS  1960's  . EXCISIONAL HEMORRHOIDECTOMY  1980's  . LUMBAR LAMINECTOMY  02/28/2013  .  LUMBAR LAMINECTOMY/DECOMPRESSION MICRODISCECTOMY N/A 02/28/2013   Procedure: LUMBAR LAMINECTOMY/DECOMPRESSION MICRODISCECTOMY/Lumbar 4-5 decompression;  Surgeon: Sinclair Ship, MD;  Location: [facility];  Service: Orthopedics;  Laterality: N/A;  Lumbar 4-5 decompression  . TONSILLECTOMY  1950  . TUBAL LIGATION  1972   Family History  Problem Relation Age of Onset  . Alzheimer's disease Mother   . Prostate cancer Other        grandfather  . Stroke Other        grandparent   Social History   Socioeconomic History  . Marital status: Married    Spouse name: [name]  . Number of children: 2  . Years of education: 73  . Highest education level: Some college, no degree  Occupational History  . 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 Financial resource strain: Not hard at all  . Food insecurity:    Worry: Never true    Inability: Never true  . Transportation needs:    Medical: No    Non-medical: No  Tobacco Use  . Smoking status: Current Some Day Smoker    Packs/day: 0.25    Years: 40.00    Pack years: 10.00    Types: Cigarettes  . Smokeless tobacco: Never Used  . Tobacco comment: 02/28/2013 "started smoking eletronic cigarettes a couple years ago"  Substance and Sexual Activity  . Alcohol use: Yes    Alcohol/week: 5.0 standard drinks    Types: 5 Glasses of wine per week    Comment: occasionally social  . Drug use: No  . Sexual activity: Not Currently  Lifestyle  . Physical activity:    Days per week: 0 days    Minutes per session: 0 min  . Stress: Not at all  Relationships  . Social connections:    Talks on phone: Three times a week    Gets together: Once a week    Attends religious service: More than 4 times per year    Active member of club or organization: No    Attends meetings of clubs or organizations: Never    Relationship status: Married  Other Topics Concern  . Not on file  Social History Narrative   On committees at her  church.   Reads a lot during the day. Enjoys shopping.   Caffeine in the morning- 1 cup f coffee    Outpatient Encounter Medications as of 07/11/2018  Medication Sig  . albuterol (PROVENTIL HFA;VENTOLIN HFA) 108 (90 Base) MCG/ACT inhaler Inhale 2 puffs into the lungs every 6 (six) hours as needed for wheezing.  Marland Kitchen amitriptyline (ELAVIL) 25 MG tablet Take 25 mg by mouth at bedtime.  Marland Kitchen amLODipine (NORVASC) 5 MG tablet TAKE ONE TABLET BY MOUTH EVERY DAY  . atenolol (TENORMIN) 100 MG tablet TAKE 1 TABLET BY MOUTH TWO  TIMES DAILY  . atorvastatin (LIPITOR)  20 MG tablet TAKE 1 TABLET BY MOUTH  DAILY.  Marland Kitchen FLUoxetine (PROZAC) 40 MG capsule Take 1 capsule (40 mg total) by mouth daily.  Marland Kitchen loperamide (IMODIUM) 2 MG capsule TAKE ONE CAPSULE BY MOUTH FOUR TIMES A DAY AS NEEDED FOR DIARRHEA.  Marland Kitchen losartan (COZAAR) 100 MG tablet TAKE ONE TABLET BY MOUTH EVERY DAY.  . meclizine (ANTIVERT) 25 MG tablet Take one tablet (25 mg total) by mouth 3 (three) times a day as needed.  . mirabegron ER (MYRBETRIQ) 25 MG TB24 tablet Take 1 tablet (25 mg total) by mouth daily.  Marland Kitchen omeprazole (PRILOSEC) 40 MG capsule TAKE 1 CAPSULE BY MOUTH  DAILY  . oxyCODONE-acetaminophen (PERCOCET/ROXICET) 5-325 MG tablet Take 1 tablet by mouth every 6 (six) hours as needed. for pain  . promethazine (PHENERGAN) 25 MG tablet Take 1 tablet (25 mg total) by mouth every 8 (eight) hours as needed for nausea or vomiting.  Marland Kitchen QUEtiapine (SEROQUEL) 100 MG tablet Take 1 tablet (100 mg total) by mouth at bedtime.  . gabapentin (NEURONTIN) 100 MG capsule TAKE ONE CAPSULE BY MOUTH EVERY MORNING, ONE CAPSULE AT NOON, THEN THREE CAPSULES AT BEDTIME (Patient not taking: Reported on 07/11/2018)   No facility-administered encounter medications on file as of 07/11/2018.     Activities of Daily Living In your present state of health, do you have any difficulty performing the following activities: 07/11/2018  Hearing? N  Vision? N  Difficulty concentrating or  making decisions? N  Walking or climbing stairs? N  Dressing or bathing? N  Doing errands, shopping? N  Preparing Food and eating ? N  Using the Toilet? N  In the past six months, have you accidently leaked urine? N  Do you have problems with loss of bowel control? N  Managing your Medications? N  Managing your Finances? N  Housekeeping or managing your Housekeeping? N  Some recent data might be hidden    Patient Care Team: Hali Marry, MD as PCP - General (Family Medicine) Darlis Loan, MD as Referring Physician (Gastroenterology)    Assessment:   This is a routine wellness examination for Jullian.Physical assessment deferred to PCP.   Exercise Activities and Dietary recommendations Current Exercise Habits: The patient does not participate in regular exercise at present, Exercise limited by: None identified Diet  Eats a fairly healthy diet. Eats a lot of cheese Breakfast: cottage cheese Lunch: vegetable left over. Dinner:  Scientist, research (physical sciences) and a couple of vegetables. Cooks at home mostly     Goals    . Patient Stated     Patient states would like to be able to sleep all night. Does not sleep well        Fall Risk Fall Risk  07/11/2018 12/19/2017 07/13/2016 10/28/2015 02/14/2014  Falls in the past year? 1 No No No Yes  Number falls in past yr: 0 - - - 2 or more  Injury with Fall? 0 - - - -  Risk for fall due to : - - - - Impaired balance/gait  Follow up Falls prevention discussed - - - -   Is the patient's home free of loose throw rugs in walkways, pet beds, electrical cords, etc?   yes      Grab bars in the bathroom? yes      Handrails on the stairs?   yes      Adequate lighting?   yes  Depression Screen PHQ 2/9 Scores 07/11/2018 05/18/2018 12/19/2017 02/03/2017  PHQ - 2 Score 1 2  2 3  PHQ- 9 Score 4 4 5 12      Cognitive Function     6CIT Screen 07/11/2018  What Year? 0 points  What month? 0 points  What time? 0 points  Count back from 20 0 points  Months in  reverse 0 points  Repeat phrase 0 points  Total Score 0    Immunization History  Administered Date(s) Administered  . Influenza Split 03/22/2012  . Influenza Whole 03/10/2006, 02/01/2008  . Influenza, High Dose Seasonal PF 01/06/2017, 02/01/2018  . Influenza,inj,Quad PF,6+ Mos 03/01/2013, 02/14/2014, 12/27/2014, 02/19/2016, 12/19/2017  . Pneumococcal Conjugate-13 10/01/2013  . Pneumococcal Polysaccharide-23 09/25/2008  . Td 05/03/2004  . Tdap 05/13/2015  . Zoster 05/30/2015    Screening Tests Health Maintenance  Topic Date Due  . COLONOSCOPY  10/03/2018  . MAMMOGRAM  07/08/2019  . TETANUS/TDAP  05/12/2025  . INFLUENZA VACCINE  Completed  . Hepatitis C Screening  Completed  . PNA vac Low Risk Adult  Completed        Plan:    Ms. Cavazos , Thank you for taking time to come for your Medicare Wellness Visit. I appreciate your ongoing commitment to your health goals. Please review the following plan we discussed and let me know if I can assist you in the future.  Please schedule your next medicare wellness visit with me in 1 yr.  Bring a copy of your living will and/or healthcare power of attorney to your next office visit.  These are the goals we discussed: Goals    . Patient Stated     Patient states would like to be able to sleep all night. Does not sleep well        This is a list of the screening recommended for you and due dates:  Health Maintenance  Topic Date Due  . Colon Cancer Screening  10/03/2018  . Mammogram  07/08/2019  . Tetanus Vaccine  05/12/2025  . Flu Shot  Completed  .  Hepatitis C: One time screening is recommended by Center for Disease Control  (CDC) for  adults born from [birth year] through 1967.   Completed  . Pneumonia vaccines  Completed     I have personally reviewed and noted the following in the patient's chart:   . Medical and social history . Use of alcohol, tobacco or illicit drugs  . Current medications and supplements . Functional  ability and status . Nutritional status . Physical activity . Advanced directives . List of other physicians . Hospitalizations, surgeries, and ER visits in previous 12 months . Vitals . Screenings to include cognitive, depression, and falls . Referrals and appointments  In addition, I have reviewed and discussed with patient certain preventive protocols, quality metrics, and best practice recommendations. A written personalized care plan for preventive services as well as general preventive health recommendations were provided to patient.     Joanne Chars, LPN  [id number]

## 2018-07-11 ENCOUNTER — Ambulatory Visit (INDEPENDENT_AMBULATORY_CARE_PROVIDER_SITE_OTHER): Payer: Medicare Other | Admitting: *Deleted

## 2018-07-11 VITALS — BP 101/57 | HR 71 | Ht 62.0 in | Wt 147.0 lb

## 2018-07-11 DIAGNOSIS — Z0000 Encounter for general adult medical examination without abnormal findings: Secondary | ICD-10-CM

## 2018-07-11 NOTE — Patient Instructions (Signed)
Annette Ramirez , Thank you for taking time to come for your Medicare Wellness Visit. I appreciate your ongoing commitment to your health goals. Please review the following plan we discussed and let me know if I can assist you in the future.  Please schedule your next medicare wellness visit with me in 1 yr. Bring a copy of your living will and/or healthcare power of attorney to your next office visit.  These are the goals we discussed: Goals    . Patient Stated     Patient states would like to be able to sleep all night. Does not sleep well

## 2018-07-13 ENCOUNTER — Other Ambulatory Visit: Payer: Self-pay

## 2018-07-13 ENCOUNTER — Other Ambulatory Visit: Payer: Self-pay | Admitting: Family Medicine

## 2018-07-13 DIAGNOSIS — M545 Low back pain, unspecified: Secondary | ICD-10-CM

## 2018-07-13 DIAGNOSIS — G8929 Other chronic pain: Secondary | ICD-10-CM

## 2018-07-13 NOTE — Telephone Encounter (Signed)
Patient is requesting a refill on Oxycodone. 

## 2018-07-14 MED ORDER — OXYCODONE-ACETAMINOPHEN 5-325 MG PO TABS: 1.0000 | ORAL_TABLET | Freq: Four times a day (QID) | ORAL | 0 refills | Status: DC | PRN

## 2018-07-17 ENCOUNTER — Other Ambulatory Visit: Payer: Self-pay | Admitting: Family Medicine

## 2018-07-19 ENCOUNTER — Other Ambulatory Visit: Payer: Self-pay | Admitting: Family Medicine

## 2018-08-14 ENCOUNTER — Other Ambulatory Visit: Payer: Self-pay | Admitting: Family Medicine

## 2018-08-17 ENCOUNTER — Ambulatory Visit: Payer: Medicare Other | Admitting: Family Medicine

## 2018-08-22 ENCOUNTER — Other Ambulatory Visit: Payer: Self-pay

## 2018-08-22 ENCOUNTER — Ambulatory Visit (INDEPENDENT_AMBULATORY_CARE_PROVIDER_SITE_OTHER): Payer: Medicare Other | Admitting: Family Medicine

## 2018-08-22 ENCOUNTER — Encounter: Payer: Self-pay | Admitting: Family Medicine

## 2018-08-22 ENCOUNTER — Other Ambulatory Visit: Payer: Self-pay | Admitting: *Deleted

## 2018-08-22 VITALS — Temp 97.0°F | Ht 62.0 in | Wt 147.0 lb

## 2018-08-22 DIAGNOSIS — F331 Major depressive disorder, recurrent, moderate: Secondary | ICD-10-CM | POA: Diagnosis not present

## 2018-08-22 DIAGNOSIS — M545 Low back pain, unspecified: Secondary | ICD-10-CM

## 2018-08-22 DIAGNOSIS — K591 Functional diarrhea: Secondary | ICD-10-CM

## 2018-08-22 DIAGNOSIS — E871 Hypo-osmolality and hyponatremia: Secondary | ICD-10-CM

## 2018-08-22 DIAGNOSIS — D721 Eosinophilia, unspecified: Secondary | ICD-10-CM

## 2018-08-22 DIAGNOSIS — L659 Nonscarring hair loss, unspecified: Secondary | ICD-10-CM

## 2018-08-22 DIAGNOSIS — J42 Unspecified chronic bronchitis: Secondary | ICD-10-CM | POA: Diagnosis not present

## 2018-08-22 DIAGNOSIS — G8929 Other chronic pain: Secondary | ICD-10-CM | POA: Diagnosis not present

## 2018-08-22 MED ORDER — TIOTROPIUM BROMIDE MONOHYDRATE 2.5 MCG/ACT IN AERS: 2.0000 | INHALATION_SPRAY | RESPIRATORY_TRACT | 99 refills | Status: DC

## 2018-08-22 MED ORDER — OXYCODONE-ACETAMINOPHEN 5-325 MG PO TABS: 1.0000 | ORAL_TABLET | Freq: Four times a day (QID) | ORAL | 0 refills | Status: DC | PRN

## 2018-08-22 MED ORDER — FLUOXETINE HCL 40 MG PO CAPS: 40.0000 mg | ORAL_CAPSULE | Freq: Every day | ORAL | 1 refills | Status: DC

## 2018-08-22 NOTE — Progress Notes (Signed)
Virtual Visit via Video Note  I connected with Annette Ramirez on 08/22/18 at  1:40 PM EDT by video and verified that I am speaking with the correct person using two identifiers.   I discussed the limitations, risks, security and privacy concerns of performing an evaluation and management service by telephone and the availability of in person appointments. I also discussed with the patient that there may be a patient responsible charge related to this service. The patient expressed understanding and agreed to proceed.   Subjective:    CC: F/U chronic pain  HPI: F/U chronic pain management -he is doing okay on her regimen and she is here today for refills on her oxycodone.Taking 5/325mg  oxycodone.    F/U COPD - Not currently on a controller. Had CBC a year ago that showed really high levels of eosinophils.  She had a couple of days wher eshe felt more SOB.  She knows she really needs to be on a controller and wanted to discuss Spiriva today.    F/U MDD - currently on fluoxetin 40mg .  She feels she is dong well and is happy with her regimen.  Hasn't really minded the stay at home order.    Past medical history, Surgical history, Family history not pertinant except as noted below, Social history, Allergies, and medications have been entered into the medical record, reviewed, and corrections made.   Review of Systems: No fevers, chills, night sweats, weight loss, chest pain, or shortness of breath.   Objective:    General: Speaking clearly in complete sentences without any shortness of breath.  Alert and oriented x3.  Normal judgment. No apparent acute distress.well groomed.     Impression and Recommendations:    Chronic Pain Mgt/chronic Low back pain - will refill her pain medications. She has been splitting tab and taking 1/2 in AM, 1/ at noon and whole at bedtime and that seems to be working well fo her. F/u in 3 mo.   COPD - no recent persistant flares. Had a couple of days where  she felt symptomatic.  Will start spiriva. Sent to mail order per her request.   Depression - Stable. She is happy with her regimen.   Meds ordered this encounter  Medications  . oxyCODONE-acetaminophen (PERCOCET/ROXICET) 5-325 MG tablet    Sig: Take 1 tablet by mouth every 6 (six) hours as needed. for pain    Dispense:  60 tablet    Refill:  0  . Tiotropium Bromide Monohydrate (SPIRIVA RESPIMAT) 2.5 MCG/ACT AERS    Sig: Inhale 2 puffs into the lungs every morning.    Dispense:  12 g    Refill:  PRN       I discussed the assessment and treatment plan with the patient. The patient was provided an opportunity to ask questions and all were answered. The patient agreed with the plan and demonstrated an understanding of the instructions.   The patient was advised to call back or seek an in-person evaluation if the symptoms worsen or if the condition fails to improve as anticipated.   Beatrice Lecher, MD

## 2018-09-18 ENCOUNTER — Other Ambulatory Visit: Payer: Self-pay | Admitting: *Deleted

## 2018-09-18 DIAGNOSIS — K219 Gastro-esophageal reflux disease without esophagitis: Secondary | ICD-10-CM

## 2018-09-18 MED ORDER — OMEPRAZOLE 40 MG PO CPDR: 40.0000 mg | DELAYED_RELEASE_CAPSULE | Freq: Every day | ORAL | 3 refills | Status: DC

## 2018-10-02 ENCOUNTER — Other Ambulatory Visit: Payer: Self-pay | Admitting: *Deleted

## 2018-10-02 MED ORDER — MECLIZINE HCL 25 MG PO TABS: 25.0000 mg | ORAL_TABLET | Freq: Three times a day (TID) | ORAL | 1 refills | Status: DC | PRN

## 2018-10-02 MED ORDER — GABAPENTIN 100 MG PO CAPS: ORAL_CAPSULE | ORAL | 3 refills | Status: DC

## 2018-10-13 ENCOUNTER — Other Ambulatory Visit: Payer: Self-pay

## 2018-10-13 ENCOUNTER — Encounter: Payer: Self-pay | Admitting: Family Medicine

## 2018-10-13 ENCOUNTER — Ambulatory Visit (INDEPENDENT_AMBULATORY_CARE_PROVIDER_SITE_OTHER): Payer: Medicare Other | Admitting: Family Medicine

## 2018-10-13 VITALS — BP 158/79 | HR 77 | Ht 62.0 in | Wt 147.0 lb

## 2018-10-13 DIAGNOSIS — I10 Essential (primary) hypertension: Secondary | ICD-10-CM

## 2018-10-13 DIAGNOSIS — S0990XA Unspecified injury of head, initial encounter: Secondary | ICD-10-CM | POA: Diagnosis not present

## 2018-10-13 DIAGNOSIS — R7309 Other abnormal glucose: Secondary | ICD-10-CM

## 2018-10-13 DIAGNOSIS — G8929 Other chronic pain: Secondary | ICD-10-CM | POA: Diagnosis not present

## 2018-10-13 DIAGNOSIS — M545 Low back pain, unspecified: Secondary | ICD-10-CM

## 2018-10-13 DIAGNOSIS — S0991XA Unspecified injury of ear, initial encounter: Secondary | ICD-10-CM

## 2018-10-13 MED ORDER — OXYCODONE-ACETAMINOPHEN 5-325 MG PO TABS: 1.0000 | ORAL_TABLET | Freq: Four times a day (QID) | ORAL | 0 refills | Status: DC | PRN

## 2018-10-13 NOTE — Assessment & Plan Note (Signed)
Initial blood pressure was high but repeat blood pressure looked much better.  We will continue to keep an eye on this.  She is due for lipid panel encouraged her to get the lab when she is able to.

## 2018-10-13 NOTE — Assessment & Plan Note (Addendum)
Doing well on her current regimen.  She is technically due for urine drug screen sometime next month so we will do it at her follow-up in 3 months.  Did refill medication today.  Did check the PD MP.  Indication for chronic opioid:chronic bilateral low back pain Medication and dose:Oxycodone 09/03/2023, 1 tab twice a day as needed. # pills per month:60 Last UDS date:05/2018 Opioid Treatment Agreement signed (Y/N):Y Opioid Treatment Agreement last reviewed with patient: N[facility]SRS reviewed this encounter (include red flags):Yes

## 2018-10-13 NOTE — Progress Notes (Signed)
Established Patient Office Visit  Subjective:  Patient ID: Annette Ramirez, female    DOB: Nov 24, 1945  Age: 73 y.o. MRN: [id number]  CC:  Chief Complaint  Patient presents with  . Fall    she fell 6 wks ago and hit the back of her head on the window ceil     HPI Annette Ramirez presents for fell 6 weeks ago trying to get into her bed and hit the corner of a window.  She vomited a few days later. No vision changes but still has a pain in the back of the her head where she hit it and she would like me to look at it today.  She has not had any headaches.  She said she also hit her left ear which was cut and bleeding in the scalp where she had it was also cut and bleeding.  She said she can still feel a knot on the back of the scalp.  She has had sodium issues in the past.  She says she liked her lips and arm one days it was very salty but says she wasn't sweating at the time.  She denies any new medication changes.  She has not been sick recently.  She is just not sure what may have caused it and is quite concerned.  Follow-up chronic pain management for bilateral low back pain without sciatica.  She actually did not request a refill for her pain medication last month but says she does need it filled today.  She has been trying to use it more sparingly.  Past Medical History:  Diagnosis Date  . Anxiety   . Asthma   . COPD (chronic obstructive pulmonary disease) ([place])   . Depression   . Exertional shortness of breath   . Family history of anesthesia complication    "granddaughter has PONV" (02/28/2013)  . Fibromyalgia   . GERD (gastroesophageal reflux disease)   . H/O hiatal hernia   . Hypertension   . Migraines    "in the past; they've stopped" (02/28/2013)  . Opiate use 06/06/2017  . Osteoporosis   . PONV (postoperative nausea and vomiting)   . Sinus headache     Past Surgical History:  Procedure Laterality Date  . ABDOMINAL HYSTERECTOMY  1975  . APPENDECTOMY  1975  .  BREAST CYST ASPIRATION Left 1980's  . COLONOSCOPY W/ BIOPSIES AND POLYPECTOMY  2012  . DILATION AND CURETTAGE OF UTERUS  1960's  . EXCISIONAL HEMORRHOIDECTOMY  1980's  . LUMBAR LAMINECTOMY  02/28/2013  . LUMBAR LAMINECTOMY/DECOMPRESSION MICRODISCECTOMY N/A 02/28/2013   Procedure: LUMBAR LAMINECTOMY/DECOMPRESSION MICRODISCECTOMY/Lumbar 4-5 decompression;  Surgeon: Sinclair Ship, MD;  Location: [facility];  Service: Orthopedics;  Laterality: N/A;  Lumbar 4-5 decompression  . TONSILLECTOMY  1950  . TUBAL LIGATION  1972    Family History  Problem Relation Age of Onset  . Alzheimer's disease Mother   . Prostate cancer Other        grandfather  . Stroke Other        grandparent    Social History   Socioeconomic History  . Marital status: Married    Spouse name: [name]  . Number of children: 2  . Years of education: 75  . Highest education level: Some college, no degree  Occupational History  . 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 Financial resource strain: Not hard at all  . Food insecurity    Worry: Never true  Inability: Never true  . Transportation needs    Medical: No    Non-medical: No  Tobacco Use  . Smoking status: Current Some Day Smoker    Packs/day: 0.25    Years: 40.00    Pack years: 10.00    Types: Cigarettes  . Smokeless tobacco: Never Used  . Tobacco comment: 02/28/2013 "started smoking eletronic cigarettes a couple years ago"  Substance and Sexual Activity  . Alcohol use: Yes    Alcohol/week: 5.0 standard drinks    Types: 5 Glasses of wine per week    Comment: occasionally social  . Drug use: No  . Sexual activity: Not Currently  Lifestyle  . Physical activity    Days per week: 0 days    Minutes per session: 0 min  . Stress: Not at all  Relationships  . Social Herbalist on phone: Three times a week    Gets together: Once a week    Attends religious service: More than 4 times per year    Active member  of club or organization: No    Attends meetings of clubs or organizations: Never    Relationship status: Married  . Intimate partner violence    Fear of current or ex partner: No    Emotionally abused: No    Physically abused: No    Forced sexual activity: Not on file  Other Topics Concern  . 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   Reads a lot during the day. Enjoys shopping.   Caffeine in the morning- 1 cup f coffee    Outpatient Medications Prior to Visit  Medication Sig Dispense Refill  . albuterol (PROVENTIL HFA;VENTOLIN HFA) 108 (90 Base) MCG/ACT inhaler Inhale 2 puffs into the lungs every 6 (six) hours as needed for wheezing. 54 g 3  . amitriptyline (ELAVIL) 25 MG tablet Take 25 mg by mouth at bedtime.    Marland Kitchen amLODipine (NORVASC) 5 MG tablet TAKE ONE TABLET BY MOUTH EVERY DAY 90 tablet 1  . atenolol (TENORMIN) 100 MG tablet TAKE 1 TABLET BY MOUTH TWO  TIMES DAILY 180 tablet 1  . atorvastatin (LIPITOR) 20 MG tablet TAKE 1 TABLET BY MOUTH  DAILY 90 tablet 3  . FLUoxetine (PROZAC) 40 MG capsule Take 1 capsule (40 mg total) by mouth daily. 90 capsule 1  . gabapentin (NEURONTIN) 100 MG capsule TAKE ONE CAPSULE BY MOUTH EVERY MORNING, ONE CAPSULE AT NOON, THEN THREE CAPSULES AT BEDTIME 450 capsule 3  . loperamide (IMODIUM) 2 MG capsule TAKE ONE CAPSULE BY MOUTH FOUR TIMES A DAY AS NEEDED FOR DIARRHEA.  1  . losartan (COZAAR) 100 MG tablet TAKE ONE TABLET BY MOUTH EVERY DAY. 90 tablet 3  . meclizine (ANTIVERT) 25 MG tablet Take 1 tablet (25 mg total) by mouth 3 (three) times daily as needed for dizziness. 90 tablet 1  . MYRBETRIQ 25 MG TB24 tablet Take 1 tablet (25 mg total) by mouth daily. 90 tablet 1  . omeprazole (PRILOSEC) 40 MG capsule Take 1 capsule (40 mg total) by mouth daily. 90 capsule 3  . promethazine (PHENERGAN) 25 MG tablet Take 1 tablet (25 mg total) by mouth every 8 (eight) hours as needed for nausea or vomiting. 30 tablet 0  . QUEtiapine  (SEROQUEL) 100 MG tablet Take 1 tablet (100 mg total) by mouth at bedtime. 90 tablet 1  . Tiotropium Bromide Monohydrate (SPIRIVA RESPIMAT) 2.5 MCG/ACT AERS Inhale 2 puffs into  the lungs every morning. 12 g PRN  . oxyCODONE-acetaminophen (PERCOCET/ROXICET) 5-325 MG tablet Take 1 tablet by mouth every 6 (six) hours as needed. for pain 60 tablet 0   No facility-administered medications prior to visit.     Allergies  Allergen Reactions  . Amoxicillin-Pot Clavulanate     REACTION: nausea and abdominal pain  . Aspirin     REACTION: upset stomach  . Codeine     REACTION: nausea  . Hctz [Hydrochlorothiazide] Other (See Comments)    Hyponatremia  . Nsaids Nausea Only    ROS Review of Systems    Objective:    Physical Exam  Constitutional: She is oriented to person, place, and time. She appears well-developed and well-nourished.  HENT:  Head: Normocephalic and atraumatic.  Right Ear: External ear normal.  Left Ear: External ear normal.  Nose: Nose normal.  Not see any lesions or scars or erythema on the left ear she says is still little tender.  Cardiovascular: Normal rate, regular rhythm and normal heart sounds.  Pulmonary/Chest: Effort normal and breath sounds normal.  Neurological: She is alert and oriented to person, place, and time.  Skin: Skin is warm and dry.  On the posterior scalp just to the left there is a scar that is healing well.  No sign of induration or erythema.  She does have a little bit of a swollen area where I could tell there was a goose egg after she hit.  Explained is likely hematoma.  Psychiatric: She has a normal mood and affect. Her behavior is normal.    BP (!) 158/79   Pulse 77   Ht 5\' 2"  (1.575 m)   Wt 147 lb (66.7 kg)   SpO2 95%   BMI 26.89 kg/m  Wt Readings from Last 3 Encounters:  10/13/18 147 lb (66.7 kg)  08/22/18 147 lb (66.7 kg)  07/11/18 147 lb (66.7 kg)     Health Maintenance Due  Topic Date Due  . COLONOSCOPY  10/03/2018     There are no preventive care reminders to display for this patient.  Lab Results  Component Value Date   TSH 2.42 09/29/2017   Lab Results  Component Value Date   WBC 6.7 09/29/2017   HGB 12.7 09/29/2017   HCT 36.3 09/29/2017   MCV 93.8 09/29/2017   PLT 209 09/29/2017   Lab Results  Component Value Date   NA 135 05/18/2018   K 4.5 05/18/2018   CO2 30 05/18/2018   GLUCOSE 101 (H) 05/18/2018   BUN 17 05/18/2018   CREATININE 0.71 05/18/2018   BILITOT 1.0 05/18/2018   ALKPHOS 58 12/09/2016   AST 16 05/18/2018   ALT 9 05/18/2018   PROT 6.6 05/18/2018   ALBUMIN 4.5 12/09/2016   CALCIUM 9.5 05/18/2018   Lab Results  Component Value Date   CHOL 173 09/29/2017   Lab Results  Component Value Date   HDL 74 09/29/2017   Lab Results  Component Value Date   LDLCALC 77 09/29/2017   Lab Results  Component Value Date   TRIG 134 09/29/2017   Lab Results  Component Value Date   CHOLHDL 2.3 09/29/2017   No results found for: HGBA1C    Assessment & Plan:   Problem List Items Addressed This Visit      Cardiovascular and Mediastinum   HYPERTENSION, BENIGN ESSENTIAL - Primary    Initial blood pressure was high but repeat blood pressure looked much better.  We will continue to keep an  eye on this.  She is due for lipid panel encouraged her to get the lab when she is able to.      Relevant Orders   COMPLETE METABOLIC PANEL WITH GFR   Lipid panel   Hemoglobin A1c     Other   Encounter for chronic pain management    Doing well on her current regimen.  She is technically due for urine drug screen sometime next month so we will do it at her follow-up in 3 months.  Did refill medication today.  Did check the PD MP.  Indication for chronic opioid:chronic bilateral low back pain Medication and dose:Oxycodone 09/03/2023, 1 tab twice a day as needed. # pills per month:60 Last UDS date:05/2018 Opioid Treatment Agreement signed (Y/N):Y Opioid Treatment Agreement last  reviewed with patient: N[facility] reviewed this encounter (include red flags):Yes      Chronic lower back pain   Relevant Medications   oxyCODONE-acetaminophen (PERCOCET/ROXICET) 5-325 MG tablet    Other Visit Diagnoses    Abnormal glucose       Relevant Orders   Hemoglobin A1c   Injury of head, initial encounter       Injury of left ear, initial encounter         Injury with likely hematoma on the posterior scalp.  Recommend that she just massage the area and that should help reduce the swelling that is present.  I did warn her that that sometimes hematomas can calcify and leave a permanent bump.  But the laceration actually looks well-healed.  I also encouraged her to call to schedule her mammogram as she is due.  Meds ordered this encounter  Medications  . oxyCODONE-acetaminophen (PERCOCET/ROXICET) 5-325 MG tablet    Sig: Take 1 tablet by mouth every 6 (six) hours as needed. for pain    Dispense:  60 tablet    Refill:  0    Follow-up: Return in about 3 months (around 01/13/2019) for Pain medication .    Beatrice Lecher, MD

## 2018-10-14 LAB — LIPID PANEL
Cholesterol: 197 mg/dL (ref <200)
HDL: 73 mg/dL (ref >=50)
LDL Cholesterol (Calc): 102 mg/dL (calc) — ABNORMAL HIGH
Non-HDL Cholesterol (Calc): 124 mg/dL (calc) (ref <130)
Total CHOL/HDL Ratio: 2.7 (calc) (ref <5.0)
Triglycerides: 120 mg/dL (ref <150)

## 2018-10-14 LAB — COMPLETE METABOLIC PANEL WITH GFR
AG Ratio: 2.1 (calc) (ref 1.0–2.5)
ALT: 8 U/L (ref 6–29)
AST: 15 U/L (ref 10–35)
Albumin: 4.5 g/dL (ref 3.6–5.1)
Alkaline phosphatase (APISO): 59 U/L (ref 37–153)
BUN/Creatinine Ratio: 19 (calc) (ref 6–22)
BUN: 21 mg/dL (ref 7–25)
CO2: 23 mmol/L (ref 20–32)
Calcium: 9.1 mg/dL (ref 8.6–10.4)
Chloride: 97 mmol/L — ABNORMAL LOW (ref 98–110)
Creat: 1.09 mg/dL — ABNORMAL HIGH (ref 0.60–0.93)
GFR, Est African American: 58 mL/min/{1.73_m2} — ABNORMAL LOW (ref >=60)
GFR, Est Non African American: 50 mL/min/{1.73_m2} — ABNORMAL LOW (ref >=60)
Globulin: 2.1 g/dL (calc) (ref 1.9–3.7)
Glucose, Bld: 105 mg/dL — ABNORMAL HIGH (ref 65–99)
Potassium: 5.2 mmol/L (ref 3.5–5.3)
Sodium: 134 mmol/L — ABNORMAL LOW (ref 135–146)
Total Bilirubin: 0.4 mg/dL (ref 0.2–1.2)
Total Protein: 6.6 g/dL (ref 6.1–8.1)

## 2018-10-14 LAB — HEMOGLOBIN A1C
Hgb A1c MFr Bld: 5.2 % of total Hgb (ref <5.7)
Mean Plasma Glucose: 103 (calc)
eAG (mmol/L): 5.7 (calc)

## 2018-10-20 ENCOUNTER — Other Ambulatory Visit: Payer: Self-pay | Admitting: Family Medicine

## 2018-10-25 ENCOUNTER — Other Ambulatory Visit: Payer: Self-pay | Admitting: *Deleted

## 2018-10-25 MED ORDER — LOSARTAN POTASSIUM 100 MG PO TABS: 100.0000 mg | ORAL_TABLET | Freq: Every day | ORAL | 3 refills | Status: DC

## 2018-10-25 MED ORDER — QUETIAPINE FUMARATE 100 MG PO TABS: 100.0000 mg | ORAL_TABLET | Freq: Every day | ORAL | 1 refills | Status: DC

## 2018-10-25 MED ORDER — AMITRIPTYLINE HCL 25 MG PO TABS: 25.0000 mg | ORAL_TABLET | Freq: Every day | ORAL | 1 refills | Status: DC

## 2018-10-29 DIAGNOSIS — K219 Gastro-esophageal reflux disease without esophagitis: Secondary | ICD-10-CM | POA: Diagnosis not present

## 2018-10-29 DIAGNOSIS — R55 Syncope and collapse: Secondary | ICD-10-CM | POA: Diagnosis not present

## 2018-10-29 DIAGNOSIS — M549 Dorsalgia, unspecified: Secondary | ICD-10-CM | POA: Diagnosis not present

## 2018-10-29 DIAGNOSIS — M797 Fibromyalgia: Secondary | ICD-10-CM | POA: Diagnosis not present

## 2018-10-29 DIAGNOSIS — J449 Chronic obstructive pulmonary disease, unspecified: Secondary | ICD-10-CM | POA: Diagnosis not present

## 2018-10-29 DIAGNOSIS — I10 Essential (primary) hypertension: Secondary | ICD-10-CM | POA: Diagnosis not present

## 2018-10-29 DIAGNOSIS — Z7951 Long term (current) use of inhaled steroids: Secondary | ICD-10-CM | POA: Diagnosis not present

## 2018-10-29 DIAGNOSIS — G8929 Other chronic pain: Secondary | ICD-10-CM | POA: Diagnosis not present

## 2018-10-29 DIAGNOSIS — Z888 Allergy status to other drugs, medicaments and biological substances status: Secondary | ICD-10-CM | POA: Diagnosis not present

## 2018-10-29 DIAGNOSIS — R0602 Shortness of breath: Secondary | ICD-10-CM | POA: Diagnosis not present

## 2018-10-29 DIAGNOSIS — R079 Chest pain, unspecified: Secondary | ICD-10-CM | POA: Diagnosis not present

## 2018-10-29 DIAGNOSIS — Z79899 Other long term (current) drug therapy: Secondary | ICD-10-CM | POA: Diagnosis not present

## 2018-10-29 DIAGNOSIS — Z880 Allergy status to penicillin: Secondary | ICD-10-CM | POA: Diagnosis not present

## 2018-10-29 DIAGNOSIS — N390 Urinary tract infection, site not specified: Secondary | ICD-10-CM | POA: Diagnosis not present

## 2018-10-29 DIAGNOSIS — K760 Fatty (change of) liver, not elsewhere classified: Secondary | ICD-10-CM | POA: Diagnosis not present

## 2018-10-29 DIAGNOSIS — Z885 Allergy status to narcotic agent status: Secondary | ICD-10-CM | POA: Diagnosis not present

## 2018-10-29 DIAGNOSIS — J9601 Acute respiratory failure with hypoxia: Secondary | ICD-10-CM | POA: Diagnosis not present

## 2018-10-30 DIAGNOSIS — N390 Urinary tract infection, site not specified: Secondary | ICD-10-CM | POA: Diagnosis not present

## 2018-10-30 DIAGNOSIS — J9601 Acute respiratory failure with hypoxia: Secondary | ICD-10-CM | POA: Diagnosis not present

## 2018-10-30 DIAGNOSIS — I10 Essential (primary) hypertension: Secondary | ICD-10-CM | POA: Diagnosis not present

## 2018-10-30 DIAGNOSIS — R0902 Hypoxemia: Secondary | ICD-10-CM | POA: Diagnosis not present

## 2018-10-30 DIAGNOSIS — I519 Heart disease, unspecified: Secondary | ICD-10-CM | POA: Diagnosis not present

## 2018-10-30 DIAGNOSIS — J449 Chronic obstructive pulmonary disease, unspecified: Secondary | ICD-10-CM | POA: Diagnosis not present

## 2018-10-30 DIAGNOSIS — R55 Syncope and collapse: Secondary | ICD-10-CM | POA: Diagnosis not present

## 2018-10-30 DIAGNOSIS — K219 Gastro-esophageal reflux disease without esophagitis: Secondary | ICD-10-CM | POA: Diagnosis not present

## 2018-11-06 ENCOUNTER — Other Ambulatory Visit: Payer: Self-pay

## 2018-11-06 ENCOUNTER — Encounter: Payer: Self-pay | Admitting: Family Medicine

## 2018-11-06 ENCOUNTER — Ambulatory Visit (INDEPENDENT_AMBULATORY_CARE_PROVIDER_SITE_OTHER): Payer: Medicare Other | Admitting: Family Medicine

## 2018-11-06 VITALS — BP 104/58 | HR 74 | Ht 62.0 in | Wt 145.0 lb

## 2018-11-06 DIAGNOSIS — J42 Unspecified chronic bronchitis: Secondary | ICD-10-CM | POA: Diagnosis not present

## 2018-11-06 DIAGNOSIS — E878 Other disorders of electrolyte and fluid balance, not elsewhere classified: Secondary | ICD-10-CM

## 2018-11-06 DIAGNOSIS — R790 Abnormal level of blood mineral: Secondary | ICD-10-CM

## 2018-11-06 DIAGNOSIS — N3000 Acute cystitis without hematuria: Secondary | ICD-10-CM | POA: Diagnosis not present

## 2018-11-06 DIAGNOSIS — R55 Syncope and collapse: Secondary | ICD-10-CM | POA: Diagnosis not present

## 2018-11-06 MED ORDER — AMBULATORY NON FORMULARY MEDICATION: 0 refills | Status: DC

## 2018-11-06 NOTE — Progress Notes (Addendum)
Established Patient Office Visit  Subjective:  Patient ID: Annette Ramirez, female    DOB: [birth year]  Age: 73 y.o. MRN: [id number]  CC:  Chief Complaint  Patient presents with  . Follow-up    HPI Annette Ramirez presents for Hospital Follow up:  "Hospital Course: Annette Ramirez is a 73 y.o. White or Caucasian female with PMHx of depression/anxiety, HTN, esophageal reflux, COPD with ongoing tobacco abuse who presented to [facility] ED with syncopal episode the night prior while eating dinner with her friends. EMS was called and she was told she had an "absence seizure." She was not brought to ED. She does not know how long she lost consciousness for but states her friends said her head dropped forward and her lips turned blue. She denied prodromal symptoms prior to event. On presentation to ED, she noted some shortness of breath, nausea, not feeling normal. She felt like "her nerves" were acting up.   In ED, lab evaluation unremarkable. CT head negative. CTPA with no PE but showed evidence of mild emphysema. Covid negative. Troponin negative x2. Mg midlly low at 1.5. UA consistent with infection. Pt was ambulated and oxygen sats dropped to 79%. She was admitted and treated for the following:   1) Acute respiratory failure with hypoxia likely secondary to COPD-pt desaturated to 79% off oxygen with ambulation.No evidence of COPD exacerbation. CTPA negative for PE or acute abnormalities. Qualified pt for home oxygen and discussed smoking cessation.  2) Urinary tract infection without hematuria, site unspecified-treated with rocephin. Pt can resume bactrim at discharge.  3) Syncope-unclear etiology. Possibly secondary to hypoxia. No arrhythmias noted on telemetry. CT head negative. Echo with normal EF and grade I diastolic dysfunction. "  She reports that prior to the syncopal episode she had had dysuria and frequency for about 2 weeks and had even started to develop some back pain and  low-grade temperatures with it.  Since being home she is feeling much better.  She just completed her Bactrim.  She still having a little bit of dysuria but says overall feels much more like herself.  She is been resting better since she has been home.  She says she was also delivered oxygen and has been wearing it some but wonders if she really needs it.   Past Medical History:  Diagnosis Date  . Anxiety   . Asthma   . COPD (chronic obstructive pulmonary disease) ([place])   . Depression   . Exertional shortness of breath   . Family history of anesthesia complication    "granddaughter has PONV" (02/28/2013)  . Fibromyalgia   . GERD (gastroesophageal reflux disease)   . H/O hiatal hernia   . Hypertension   . Migraines    "in the past; they've stopped" (02/28/2013)  . Opiate use 06/06/2017  . Osteoporosis   . PONV (postoperative nausea and vomiting)   . Sinus headache     Past Surgical History:  Procedure Laterality Date  . ABDOMINAL HYSTERECTOMY  1975  . APPENDECTOMY  1975  . BREAST CYST ASPIRATION Left 1980's  . COLONOSCOPY W/ BIOPSIES AND POLYPECTOMY  2012  . DILATION AND CURETTAGE OF UTERUS  1960's  . EXCISIONAL HEMORRHOIDECTOMY  1980's  . LUMBAR LAMINECTOMY  02/28/2013  . LUMBAR LAMINECTOMY/DECOMPRESSION MICRODISCECTOMY N/A 02/28/2013   Procedure: LUMBAR LAMINECTOMY/DECOMPRESSION MICRODISCECTOMY/Lumbar 4-5 decompression;  Surgeon: Sinclair Ship, MD;  Location: [place];  Service: Orthopedics;  Laterality: N/A;  Lumbar 4-5 decompression  . TONSILLECTOMY  1950  . TUBAL  LIGATION  1972    Family History  Problem Relation Age of Onset  . Alzheimer's disease Mother   . Prostate cancer Other        grandfather  . Stroke Other        grandparent    Social History   Socioeconomic History  . Marital status: Married    Spouse name: [name]  . Number of children: 2  . Years of education: 110  . Highest education level: Some college, no degree  Occupational History  .  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 Financial resource strain: Not hard at all  . Food insecurity    Worry: Never true    Inability: Never true  . Transportation needs    Medical: No    Non-medical: No  Tobacco Use  . Smoking status: Current Some Day Smoker    Packs/day: 0.25    Years: 40.00    Pack years: 10.00    Types: Cigarettes  . Smokeless tobacco: Never Used  . Tobacco comment: 02/28/2013 "started smoking eletronic cigarettes a couple years ago"  Substance and Sexual Activity  . Alcohol use: Yes    Alcohol/week: 5.0 standard drinks    Types: 5 Glasses of wine per week    Comment: occasionally social  . Drug use: No  . Sexual activity: Not Currently  Lifestyle  . Physical activity    Days per week: 0 days    Minutes per session: 0 min  . Stress: Not at all  Relationships  . Social Herbalist on phone: Three times a week    Gets together: Once a week    Attends religious service: More than 4 times per year    Active member of club or organization: No    Attends meetings of clubs or organizations: Never    Relationship status: Married  . Intimate partner violence    Fear of current or ex partner: No    Emotionally abused: No    Physically abused: No    Forced sexual activity: Not on file  Other Topics Concern  . 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   Reads a lot during the day. Enjoys shopping.   Caffeine in the morning- 1 cup f coffee    Outpatient Medications Prior to Visit  Medication Sig Dispense Refill  . albuterol (PROVENTIL HFA;VENTOLIN HFA) 108 (90 Base) MCG/ACT inhaler Inhale 2 puffs into the lungs every 6 (six) hours as needed for wheezing. 54 g 3  . amitriptyline (ELAVIL) 25 MG tablet Take 1 tablet (25 mg total) by mouth at bedtime. 90 tablet 1  . amLODipine (NORVASC) 5 MG tablet TAKE ONE TABLET BY MOUTH EVERY DAY 90 tablet 1  . atenolol (TENORMIN) 100 MG tablet TAKE 1  TABLET BY MOUTH TWO  TIMES DAILY 180 tablet 1  . atorvastatin (LIPITOR) 20 MG tablet TAKE 1 TABLET BY MOUTH  DAILY 90 tablet 3  . FLUoxetine (PROZAC) 40 MG capsule Take 1 capsule (40 mg total) by mouth daily. 90 capsule 1  . gabapentin (NEURONTIN) 100 MG capsule TAKE ONE CAPSULE BY MOUTH EVERY MORNING, ONE CAPSULE AT NOON, THEN THREE CAPSULES AT BEDTIME 450 capsule 3  . loperamide (IMODIUM) 2 MG capsule TAKE ONE CAPSULE BY MOUTH FOUR TIMES A DAY AS NEEDED FOR DIARRHEA.  1  . losartan (COZAAR) 100 MG tablet Take 1 tablet (100 mg total) by  mouth daily. 90 tablet 3  . meclizine (ANTIVERT) 25 MG tablet Take 1 tablet (25 mg total) by mouth 3 (three) times daily as needed for dizziness. 90 tablet 1  . MYRBETRIQ 25 MG TB24 tablet Take 1 tablet (25 mg total) by mouth daily. 90 tablet 1  . omeprazole (PRILOSEC) 40 MG capsule Take 1 capsule (40 mg total) by mouth daily. 90 capsule 3  . oxyCODONE-acetaminophen (PERCOCET/ROXICET) 5-325 MG tablet Take 1 tablet by mouth every 6 (six) hours as needed. for pain 60 tablet 0  . promethazine (PHENERGAN) 25 MG tablet Take 1 tablet (25 mg total) by mouth every 8 (eight) hours as needed for nausea or vomiting. 30 tablet 0  . QUEtiapine (SEROQUEL) 100 MG tablet Take 1 tablet (100 mg total) by mouth at bedtime. 90 tablet 1  . Tiotropium Bromide Monohydrate (SPIRIVA RESPIMAT) 2.5 MCG/ACT AERS Inhale 2 puffs into the lungs every morning. 12 g PRN   No facility-administered medications prior to visit.     Allergies  Allergen Reactions  . Amoxicillin-Pot Clavulanate     REACTION: nausea and abdominal pain  . Aspirin     REACTION: upset stomach  . Codeine     REACTION: nausea  . Hctz [Hydrochlorothiazide] Other (See Comments)    Hyponatremia  . Nsaids Nausea Only    ROS Review of Systems    Objective:    Physical Exam  BP (!) 104/58   Pulse 74   Ht 5\' 2"  (1.575 m)   Wt 145 lb (65.8 kg)   SpO2 95%   BMI 26.52 kg/m  Wt Readings from Last 3 Encounters:   11/06/18 145 lb (65.8 kg)  10/13/18 147 lb (66.7 kg)  08/22/18 147 lb (66.7 kg)     Health Maintenance Due  Topic Date Due  . COLONOSCOPY  10/03/2018    There are no preventive care reminders to display for this patient.  Lab Results  Component Value Date   TSH 2.42 09/29/2017   Lab Results  Component Value Date   WBC 6.7 09/29/2017   HGB 12.7 09/29/2017   HCT 36.3 09/29/2017   MCV 93.8 09/29/2017   PLT 209 09/29/2017   Lab Results  Component Value Date   NA 134 (L) 10/13/2018   K 5.2 10/13/2018   CO2 23 10/13/2018   GLUCOSE 105 (H) 10/13/2018   BUN 21 10/13/2018   CREATININE 1.09 (H) 10/13/2018   BILITOT 0.4 10/13/2018   ALKPHOS 58 12/09/2016   AST 15 10/13/2018   ALT 8 10/13/2018   PROT 6.6 10/13/2018   ALBUMIN 4.5 12/09/2016   CALCIUM 9.1 10/13/2018   Lab Results  Component Value Date   CHOL 197 10/13/2018   Lab Results  Component Value Date   HDL 73 10/13/2018   Lab Results  Component Value Date   LDLCALC 102 (H) 10/13/2018   Lab Results  Component Value Date   TRIG 120 10/13/2018   Lab Results  Component Value Date   CHOLHDL 2.7 10/13/2018   Lab Results  Component Value Date   HGBA1C 5.2 10/13/2018      Assessment & Plan:   Problem List Items Addressed This Visit      Respiratory   COPD (chronic obstructive pulmonary disease) ([place])    She evidently had a drop in oxygen while ambulating at the hospital.  But she is feeling better.  We will start with getting an overnight pulse oximetry.  She believes [facility] so we will start there.  If this is normal or abnormal then I would want to do a walk test.  Offered to do it today but she declined so we will plan to do it at her follow-up in 1 month.      Relevant Medications   AMBULATORY NON FORMULARY MEDICATION    Other Visit Diagnoses    Acute cystitis without hematuria    -  Primary   Relevant Orders   BASIC METABOLIC PANEL WITH  GFR   Magnesium   Urine Culture   Syncope, unspecified syncope type       Relevant Orders   BASIC METABOLIC PANEL WITH GFR   Magnesium   Urine Culture   Abnormal blood electrolyte level       Relevant Orders   BASIC METABOLIC PANEL WITH GFR   Magnesium   Urine Culture   Low magnesium level       Relevant Orders   BASIC METABOLIC PANEL WITH GFR   Magnesium   Urine Culture      Acute cystitis-she just finished up the Bactrim antibiotic and feels like she is doing better than she was previously.  She still having just a little bit of dysuria so we will get a urine culture today just to make sure that we have completely cleared up the infection.  I did not see a urine culture on file on care everywhere.  Syncope-still unclear etiology.  Certainly she could have had a urinary tract infection that was progressing to her kidneys especially since she was running a low-grade temperature and had been extremely nauseated for several days at that point.  She was likely dehydrated and may have passed out because of that but I did warn her to keep an eye out and if at any point she feels like she is going to pass out or does pass out again then the please let me know immediately if that happens we will likely get neurology consulted as well as even consider cardiac monitor.  Though fortunately she did not have any events while hospitalized.  Abnormal electrolytes-we will recheck those today.  Low magnesium - -plan to recheck level today to make sure that she has been maintaining her levels.  Meds ordered this encounter  Medications  . AMBULATORY NON FORMULARY MEDICATION    Sig: Medication Name: Overnight pulse oximetry.  Diagnosis of COPD.  [facility] to [facility]    Dispense:  1 vial    Refill:  0    Follow-up: Return in about 4 weeks (around 12/04/2018).    Beatrice Lecher, MD

## 2018-11-06 NOTE — Assessment & Plan Note (Signed)
She evidently had a drop in oxygen while ambulating at the hospital.  But she is feeling better.  We will start with getting an overnight pulse oximetry.  She believes she is getting her oxygen supplies [facility] so we will start there.  If this is normal or abnormal then I would want to do a walk test.  Offered to do it today but she declined so we will plan to do it at her follow-up in 1 month.

## 2018-11-07 LAB — BASIC METABOLIC PANEL WITH GFR
BUN/Creatinine Ratio: 10 (calc) (ref 6–22)
BUN: 10 mg/dL (ref 7–25)
CO2: 28 mmol/L (ref 20–32)
Calcium: 9 mg/dL (ref 8.6–10.4)
Chloride: 102 mmol/L (ref 98–110)
Creat: 1 mg/dL — ABNORMAL HIGH (ref 0.60–0.93)
GFR, Est African American: 64 mL/min/{1.73_m2} (ref >=60)
GFR, Est Non African American: 55 mL/min/{1.73_m2} — ABNORMAL LOW (ref >=60)
Glucose, Bld: 105 mg/dL — ABNORMAL HIGH (ref 65–99)
Potassium: 4.8 mmol/L (ref 3.5–5.3)
Sodium: 136 mmol/L (ref 135–146)

## 2018-11-07 LAB — URINE CULTURE
MICRO NUMBER:: 637659
SPECIMEN QUALITY:: ADEQUATE

## 2018-11-07 LAB — MAGNESIUM: Magnesium: 1.5 mg/dL (ref 1.5–2.5)

## 2018-11-29 DIAGNOSIS — R0902 Hypoxemia: Secondary | ICD-10-CM | POA: Diagnosis not present

## 2018-11-29 DIAGNOSIS — J449 Chronic obstructive pulmonary disease, unspecified: Secondary | ICD-10-CM | POA: Diagnosis not present

## 2018-12-07 ENCOUNTER — Telehealth: Payer: Self-pay | Admitting: Family Medicine

## 2018-12-07 DIAGNOSIS — G8929 Other chronic pain: Secondary | ICD-10-CM

## 2018-12-07 DIAGNOSIS — M545 Low back pain, unspecified: Secondary | ICD-10-CM

## 2018-12-07 MED ORDER — OXYCODONE-ACETAMINOPHEN 5-325 MG PO TABS: 1.0000 | ORAL_TABLET | Freq: Four times a day (QID) | ORAL | 0 refills | Status: DC | PRN

## 2018-12-07 NOTE — Telephone Encounter (Signed)
Patient calls and request a refill on her pain medication. Please advise and send if ok to fill.  Thank You. KG LPN

## 2018-12-07 NOTE — Telephone Encounter (Signed)
Medication refilled

## 2018-12-08 NOTE — Telephone Encounter (Signed)
Patient was notified and did not have any questions.

## 2018-12-11 ENCOUNTER — Other Ambulatory Visit: Payer: Self-pay | Admitting: *Deleted

## 2018-12-11 MED ORDER — ATENOLOL 100 MG PO TABS: 100.0000 mg | ORAL_TABLET | Freq: Two times a day (BID) | ORAL | 1 refills | Status: DC

## 2018-12-11 MED ORDER — MIRABEGRON ER 25 MG PO TB24: ORAL_TABLET | ORAL | 3 refills | Status: DC

## 2018-12-13 ENCOUNTER — Ambulatory Visit: Payer: Medicare Other | Admitting: Family Medicine

## 2018-12-19 ENCOUNTER — Other Ambulatory Visit: Payer: Self-pay | Admitting: *Deleted

## 2018-12-19 DIAGNOSIS — K591 Functional diarrhea: Secondary | ICD-10-CM

## 2018-12-19 DIAGNOSIS — L659 Nonscarring hair loss, unspecified: Secondary | ICD-10-CM

## 2018-12-19 MED ORDER — FLUOXETINE HCL 40 MG PO CAPS: 40.0000 mg | ORAL_CAPSULE | Freq: Every day | ORAL | 1 refills | Status: DC

## 2018-12-20 ENCOUNTER — Ambulatory Visit: Payer: Medicare Other | Admitting: Family Medicine

## 2018-12-21 ENCOUNTER — Ambulatory Visit: Payer: Medicare Other | Admitting: Family Medicine

## 2018-12-28 ENCOUNTER — Ambulatory Visit: Payer: Medicare Other | Admitting: Family Medicine

## 2018-12-30 DIAGNOSIS — J449 Chronic obstructive pulmonary disease, unspecified: Secondary | ICD-10-CM | POA: Diagnosis not present

## 2018-12-30 DIAGNOSIS — R0902 Hypoxemia: Secondary | ICD-10-CM | POA: Diagnosis not present

## 2019-01-15 ENCOUNTER — Ambulatory Visit: Payer: Medicare Other | Admitting: Family Medicine

## 2019-01-19 ENCOUNTER — Telehealth: Payer: Self-pay

## 2019-01-19 NOTE — Telephone Encounter (Signed)
Patient called stating that the last time she was released from the hospital, she had a lot of oxygen delivered to her from [place]. She states that oxygen was just dropped off in her garage.   She reports she is unable to use overnight oxygen like she was directed to because it is not comfortable and doesn't fit well.   Patient wants to know what she should do. OK for me to call this company and see if they will take oxygen? She has tried multiple times and they will not return her call nor come get oxygen.   Also, patient thinks she needs to see sleep clinic to find a different solutions to her night time oxygen

## 2019-01-19 NOTE — Telephone Encounter (Signed)
We can start with doing an overnight pulse oximetry at home.  That way if it is normal then she does not need it at all and if it is abnormal then we can maybe try to work on some strategies to get her oxygen more comfortably.  Usually it is just a nasal cannula unless she is talking about CPAP so when she says it is not comfortable and does not fit well I am not sure exactly what she is talking about.  If she is just talking about a nasal cannula?

## 2019-01-26 ENCOUNTER — Other Ambulatory Visit: Payer: Self-pay

## 2019-01-26 DIAGNOSIS — G8929 Other chronic pain: Secondary | ICD-10-CM

## 2019-01-26 DIAGNOSIS — M545 Low back pain, unspecified: Secondary | ICD-10-CM

## 2019-01-26 MED ORDER — OXYCODONE-ACETAMINOPHEN 5-325 MG PO TABS: 1.0000 | ORAL_TABLET | Freq: Four times a day (QID) | ORAL | 0 refills | Status: DC | PRN

## 2019-01-26 NOTE — Telephone Encounter (Signed)
Annette Ramirez is requesting a refill on Oxycodone. She states she hasn't been able to do the overnight pulse ox. So she was waiting for this before she came in.

## 2019-01-29 NOTE — Telephone Encounter (Signed)
Called and left pt msg with Dr Gardiner Ramus note, ask that she call back with details

## 2019-01-30 DIAGNOSIS — R0902 Hypoxemia: Secondary | ICD-10-CM | POA: Diagnosis not present

## 2019-01-30 DIAGNOSIS — J449 Chronic obstructive pulmonary disease, unspecified: Secondary | ICD-10-CM | POA: Diagnosis not present

## 2019-02-20 NOTE — Telephone Encounter (Signed)
Annette Ramirez states she is not able to do the overnight O2 right now. Her daughter is in ICU and has been for the last week.

## 2019-03-01 ENCOUNTER — Other Ambulatory Visit: Payer: Self-pay | Admitting: Family Medicine

## 2019-03-21 ENCOUNTER — Ambulatory Visit (INDEPENDENT_AMBULATORY_CARE_PROVIDER_SITE_OTHER): Payer: Medicare Other | Admitting: Family Medicine

## 2019-03-21 ENCOUNTER — Encounter: Payer: Self-pay | Admitting: Family Medicine

## 2019-03-21 ENCOUNTER — Other Ambulatory Visit: Payer: Self-pay

## 2019-03-21 VITALS — BP 125/69 | HR 62 | Ht 62.0 in | Wt 149.0 lb

## 2019-03-21 DIAGNOSIS — R0789 Other chest pain: Secondary | ICD-10-CM

## 2019-03-21 DIAGNOSIS — E782 Mixed hyperlipidemia: Secondary | ICD-10-CM | POA: Diagnosis not present

## 2019-03-21 DIAGNOSIS — M545 Low back pain, unspecified: Secondary | ICD-10-CM

## 2019-03-21 DIAGNOSIS — G8929 Other chronic pain: Secondary | ICD-10-CM

## 2019-03-21 DIAGNOSIS — D721 Eosinophilia, unspecified: Secondary | ICD-10-CM | POA: Diagnosis not present

## 2019-03-21 DIAGNOSIS — R55 Syncope and collapse: Secondary | ICD-10-CM | POA: Diagnosis not present

## 2019-03-21 DIAGNOSIS — J42 Unspecified chronic bronchitis: Secondary | ICD-10-CM | POA: Diagnosis not present

## 2019-03-21 DIAGNOSIS — E871 Hypo-osmolality and hyponatremia: Secondary | ICD-10-CM | POA: Diagnosis not present

## 2019-03-21 DIAGNOSIS — I10 Essential (primary) hypertension: Secondary | ICD-10-CM | POA: Diagnosis not present

## 2019-03-21 NOTE — Assessment & Plan Note (Signed)
Well controlled 

## 2019-03-21 NOTE — Progress Notes (Signed)
Established Patient Office Visit  Subjective:  Patient ID: Annette Ramirez, female    DOB: 1945/06/29  Age: 73 y.o. MRN: [id number]  CC:  Chief Complaint  Patient presents with  . fainting spells    she believes that she may need to be checked for sleep apnea.    HPI Annette Ramirez presents for near syncopal episode.  Please see prior note from hospital follow-up from July.  She actually had a syncopal event and was taken to the emergency department on June 28.  She was found to have a UTI and a COPD exacerbation her oxygen level was quite low.  She was discharged home and since then has been doing well until about a week ago.  She says she was sitting down on her couch looking through some hospital information for her daughter.  Her daughter has special needs and she actually fell and fractured her shoulder and then developed a heart arrhythmia.  She is had 3 cardioversions and then this week had an ablation done.  She was sitting on the couch looking through paperwork when she suddenly remembers her grandson asking her if she was okay.  Per his report she seemed to have almost not off it for a few seconds.  She did not fall or hurt herself she was still sitting on the couch.  She said afterwards she felt fine like nothing it happened she does not remember having any bladder or bowel incontinence.  She denies any chest pain or palpitations that day or taking anything unusual.  She has not had any dysuria or hematuria or frequency but says her urine does look a little darker than it did before.\  She denies any respiratory symptoms such as increased shortness of breath or cough.  Pulse ox was 100% today.  No abdominal pain.  She was initially sent home with oxygen from the hospital but says that she had them pick it up.  She also let me know that a couple nights ago she had some pressure in her chest.  She says all day long she was battling some heartburn and reflux symptoms and took several  different medications to try to get it under control.  That night she went to bed and woke up with a pressing sensation in the mid lower sternum area.  She says it scared her enough that she actually had a cousin has been come in the room and sit with her for a few minutes.  She said she felt like her breathing was heavy.  She says it lasted about 15 minutes and then it kind of eased off after sitting up and then she eventually just went back to sleep.  She does have COPD and hypertension but no prior history of coronary artery disease.  She is also requesting a refill on her pain medications today.  Past Medical History:  Diagnosis Date  . Anxiety   . Asthma   . COPD (chronic obstructive pulmonary disease) ([place])   . Depression   . Exertional shortness of breath   . Family history of anesthesia complication    "granddaughter has PONV" (02/28/2013)  . Fibromyalgia   . GERD (gastroesophageal reflux disease)   . H/O hiatal hernia   . Hypertension   . Migraines    "in the past; they've stopped" (02/28/2013)  . Opiate use 06/06/2017  . Osteoporosis   . PONV (postoperative nausea and vomiting)   . Sinus headache     Past Surgical  History:  Procedure Laterality Date  . ABDOMINAL HYSTERECTOMY  1975  . APPENDECTOMY  1975  . BREAST CYST ASPIRATION Left 1980's  . COLONOSCOPY W/ BIOPSIES AND POLYPECTOMY  2012  . DILATION AND CURETTAGE OF UTERUS  1960's  . EXCISIONAL HEMORRHOIDECTOMY  1980's  . LUMBAR LAMINECTOMY  02/28/2013  . LUMBAR LAMINECTOMY/DECOMPRESSION MICRODISCECTOMY N/A 02/28/2013   Procedure: LUMBAR LAMINECTOMY/DECOMPRESSION MICRODISCECTOMY/Lumbar 4-5 decompression;  Surgeon: Sinclair Ship, MD;  Location: [facility];  Service: Orthopedics;  Laterality: N/A;  Lumbar 4-5 decompression  . TONSILLECTOMY  1950  . TUBAL LIGATION  1972    Family History  Problem Relation Age of Onset  . Alzheimer's disease Mother   . Prostate cancer Other        grandfather  . Stroke Other         grandparent    Social History   Socioeconomic History  . Marital status: Married    Spouse name: [name]  . Number of children: 2  . Years of education: 7  . Highest education level: Some college, no degree  Occupational History  . 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 Financial resource strain: Not hard at all  . Food insecurity    Worry: Never true    Inability: Never true  . Transportation needs    Medical: No    Non-medical: No  Tobacco Use  . Smoking status: Current Some Day Smoker    Packs/day: 0.25    Years: 40.00    Pack years: 10.00    Types: Cigarettes  . Smokeless tobacco: Never Used  . Tobacco comment: 02/28/2013 "started smoking eletronic cigarettes a couple years ago"  Substance and Sexual Activity  . Alcohol use: Yes    Alcohol/week: 5.0 standard drinks    Types: 5 Glasses of wine per week    Comment: occasionally social  . Drug use: No  . Sexual activity: Not Currently  Lifestyle  . Physical activity    Days per week: 0 days    Minutes per session: 0 min  . Stress: Not at all  Relationships  . Social Herbalist on phone: Three times a week    Gets together: Once a week    Attends religious service: More than 4 times per year    Active member of club or organization: No    Attends meetings of clubs or organizations: Never    Relationship status: Married  . Intimate partner violence    Fear of current or ex partner: No    Emotionally abused: No    Physically abused: No    Forced sexual activity: Not on file  Other Topics Concern  . 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   Reads a lot during the day. Enjoys shopping.   Caffeine in the morning- 1 cup f coffee    Outpatient Medications Prior to Visit  Medication Sig Dispense Refill  . albuterol (PROVENTIL HFA;VENTOLIN HFA) 108 (90 Base) MCG/ACT inhaler Inhale 2 puffs into the lungs every 6 (six) hours as needed for  wheezing. 54 g 3  . amitriptyline (ELAVIL) 25 MG tablet Take 1 tablet (25 mg total) by mouth at bedtime. 90 tablet 1  . amLODipine (NORVASC) 5 MG tablet TAKE ONE TABLET BY MOUTH EVERY DAY 90 tablet 1  . atenolol (TENORMIN) 100 MG tablet Take 1 tablet (100 mg total) by mouth 2 (two) times daily. 180 tablet  1  . atorvastatin (LIPITOR) 20 MG tablet TAKE 1 TABLET BY MOUTH  DAILY 90 tablet 3  . FLUoxetine (PROZAC) 40 MG capsule Take 1 capsule (40 mg total) by mouth daily. 90 capsule 1  . gabapentin (NEURONTIN) 100 MG capsule TAKE ONE CAPSULE BY MOUTH EVERY MORNING, ONE CAPSULE AT NOON, THEN THREE CAPSULES AT BEDTIME 450 capsule 3  . loperamide (IMODIUM) 2 MG capsule TAKE ONE CAPSULE BY MOUTH FOUR TIMES A DAY AS NEEDED FOR DIARRHEA.  1  . losartan (COZAAR) 100 MG tablet Take 1 tablet (100 mg total) by mouth daily. 90 tablet 3  . meclizine (ANTIVERT) 25 MG tablet Take 1 tablet (25 mg total) by mouth 3 (three) times daily as needed for dizziness. 90 tablet 1  . mirabegron ER (MYRBETRIQ) 25 MG TB24 tablet Take 1 tablet (25 mg total) by mouth daily. 90 tablet 3  . omeprazole (PRILOSEC) 40 MG capsule Take 1 capsule (40 mg total) by mouth daily. 90 capsule 3  . oxyCODONE-acetaminophen (PERCOCET/ROXICET) 5-325 MG tablet Take 1 tablet by mouth every 6 (six) hours as needed. for pain 60 tablet 0  . promethazine (PHENERGAN) 25 MG tablet Take 1 tablet (25 mg total) by mouth every 8 (eight) hours as needed for nausea or vomiting. 30 tablet 0  . QUEtiapine (SEROQUEL) 100 MG tablet TAKE 1 TABLET BY MOUTH AT  BEDTIME 90 tablet 3  . Tiotropium Bromide Monohydrate (SPIRIVA RESPIMAT) 2.5 MCG/ACT AERS Inhale 2 puffs into the lungs every morning. 12 g PRN  . AMBULATORY NON FORMULARY MEDICATION Medication Name: Overnight pulse oximetry.  Diagnosis of COPD.  [facility] to [facility] vial 0   No facility-administered medications prior to visit.     Allergies  Allergen Reactions  . Amoxicillin-Pot Clavulanate      REACTION: nausea and abdominal pain  . Aspirin     REACTION: upset stomach  . Codeine     REACTION: nausea  . Hctz [Hydrochlorothiazide] Other (See Comments)    Hyponatremia  . Nsaids Nausea Only    ROS Review of Systems    Objective:    Physical Exam  Constitutional: She is oriented to person, place, and time. She appears well-developed and well-nourished.  HENT:  Head: Normocephalic and atraumatic.  Cardiovascular: Normal rate, regular rhythm and normal heart sounds.  Pulmonary/Chest: Effort normal and breath sounds normal.  Neurological: She is alert and oriented to person, place, and time.  Skin: Skin is warm and dry.  Psychiatric: She has a normal mood and affect. Her behavior is normal.    BP 125/69   Pulse 62   Ht 5\' 2"  (1.575 m)   Wt 149 lb (67.6 kg)   SpO2 100%   BMI 27.25 kg/m  Wt Readings from Last 3 Encounters:  03/21/19 149 lb (67.6 kg)  11/06/18 145 lb (65.8 kg)  10/13/18 147 lb (66.7 kg)     Health Maintenance Due  Topic Date Due  . COLONOSCOPY  10/03/2018  . INFLUENZA VACCINE  12/02/2018    There are no preventive care reminders to display for this patient.  Lab Results  Component Value Date   TSH 2.42 09/29/2017   Lab Results  Component Value Date   WBC 6.7 09/29/2017   HGB 12.7 09/29/2017   HCT 36.3 09/29/2017   MCV 93.8 09/29/2017   PLT 209 09/29/2017   Lab Results  Component Value Date   NA 136 11/06/2018   K 4.8 11/06/2018   CO2 28 11/06/2018  GLUCOSE 105 (H) 11/06/2018   BUN 10 11/06/2018   CREATININE 1.00 (H) 11/06/2018   BILITOT 0.4 10/13/2018   ALKPHOS 58 12/09/2016   AST 15 10/13/2018   ALT 8 10/13/2018   PROT 6.6 10/13/2018   ALBUMIN 4.5 12/09/2016   CALCIUM 9.0 11/06/2018   Lab Results  Component Value Date   CHOL 197 10/13/2018   Lab Results  Component Value Date   HDL 73 10/13/2018   Lab Results  Component Value Date   LDLCALC 102 (H) 10/13/2018   Lab Results  Component Value Date   TRIG 120  10/13/2018   Lab Results  Component Value Date   CHOLHDL 2.7 10/13/2018   Lab Results  Component Value Date   HGBA1C 5.2 10/13/2018      Assessment & Plan:   Problem List Items Addressed This Visit      Cardiovascular and Mediastinum   HYPERTENSION, BENIGN ESSENTIAL    Well-controlled.        Other   Encounter for chronic pain management    Follow-up in 4 weeks for chronic pain management.      Chronic lower back pain    Other Visit Diagnoses    Atypical chest pain    -  Primary   Relevant Orders   EKG 12-Lead   CBC w/Diff   COMPLETE METABOLIC PANEL WITH GFR   TSH   Urinalysis, Routine w reflex microscopic   B Nat Peptide   Syncope, unspecified syncope type       Relevant Orders   CBC w/Diff   COMPLETE METABOLIC PANEL WITH GFR   TSH   Urinalysis, Routine w reflex microscopic   B Nat Peptide     Possible syncopal episode-unclear etiology.  The first time this happened at the end of June she had a UTI and COPD exacerbation which was felt to contribute to the syncopal event she has not had any problems until about a week ago where she had this witnessed brief loss of consciousness which is a little bit unusual.  No chest pain or arrhythmia that she is aware of.  She is in normal sinus rhythm today.  Atypical chest pain -consider coronary artery disease versus just uncontrolled GERD which she had been experiencing most of that day.  Also consider may be stress related as she has had a lot going on with her daughter's health recently which is been really stressful for her.  No orders of the defined types were placed in this encounter.   Follow-up: Return in about 4 weeks (around 04/18/2019) for syncope and pain management .    Beatrice Lecher, MD

## 2019-03-21 NOTE — Assessment & Plan Note (Signed)
Follow-up in 4 weeks for chronic pain management.

## 2019-03-22 LAB — COMPLETE METABOLIC PANEL WITH GFR
AG Ratio: 2 (calc) (ref 1.0–2.5)
ALT: 8 U/L (ref 6–29)
AST: 16 U/L (ref 10–35)
Albumin: 4.5 g/dL (ref 3.6–5.1)
Alkaline phosphatase (APISO): 65 U/L (ref 37–153)
BUN: 11 mg/dL (ref 7–25)
CO2: 28 mmol/L (ref 20–32)
Calcium: 9.6 mg/dL (ref 8.6–10.4)
Chloride: 96 mmol/L — ABNORMAL LOW (ref 98–110)
Creat: 0.82 mg/dL (ref 0.60–0.93)
GFR, Est African American: 81 mL/min/{1.73_m2} (ref >=60)
GFR, Est Non African American: 70 mL/min/{1.73_m2} (ref >=60)
Globulin: 2.2 g/dL (calc) (ref 1.9–3.7)
Glucose, Bld: 99 mg/dL (ref 65–99)
Potassium: 4.7 mmol/L (ref 3.5–5.3)
Sodium: 135 mmol/L (ref 135–146)
Total Bilirubin: 0.6 mg/dL (ref 0.2–1.2)
Total Protein: 6.7 g/dL (ref 6.1–8.1)

## 2019-03-22 LAB — CBC WITH DIFFERENTIAL/PLATELET
Absolute Monocytes: 873 cells/uL (ref 200–950)
Basophils Absolute: 43 cells/uL (ref 0–200)
Basophils Relative: 0.6 %
Eosinophils Absolute: 199 cells/uL (ref 15–500)
Eosinophils Relative: 2.8 %
HCT: 37.6 % (ref 35.0–45.0)
Hemoglobin: 12.8 g/dL (ref 11.7–15.5)
Lymphs Abs: 1633 cells/uL (ref 850–3900)
MCH: 32.9 pg (ref 27.0–33.0)
MCHC: 34 g/dL (ref 32.0–36.0)
MCV: 96.7 fL (ref 80.0–100.0)
MPV: 11.4 fL (ref 7.5–12.5)
Monocytes Relative: 12.3 %
Neutro Abs: 4352 cells/uL (ref 1500–7800)
Neutrophils Relative %: 61.3 %
Platelets: 206 10*3/uL (ref 140–400)
RBC: 3.89 10*6/uL (ref 3.80–5.10)
RDW: 12 % (ref 11.0–15.0)
Total Lymphocyte: 23 %
WBC: 7.1 10*3/uL (ref 3.8–10.8)

## 2019-03-22 LAB — LIPID PANEL
Cholesterol: 177 mg/dL (ref <200)
HDL: 80 mg/dL (ref >=50)
LDL Cholesterol (Calc): 73 mg/dL (calc)
Non-HDL Cholesterol (Calc): 97 mg/dL (calc) (ref <130)
Total CHOL/HDL Ratio: 2.2 (calc) (ref <5.0)
Triglycerides: 159 mg/dL — ABNORMAL HIGH (ref <150)

## 2019-03-23 ENCOUNTER — Other Ambulatory Visit: Payer: Self-pay

## 2019-03-23 DIAGNOSIS — M545 Low back pain, unspecified: Secondary | ICD-10-CM

## 2019-03-23 DIAGNOSIS — G8929 Other chronic pain: Secondary | ICD-10-CM

## 2019-03-23 MED ORDER — OXYCODONE-ACETAMINOPHEN 5-325 MG PO TABS: 1.0000 | ORAL_TABLET | Freq: Four times a day (QID) | ORAL | 0 refills | Status: DC | PRN

## 2019-03-23 NOTE — Telephone Encounter (Signed)
Patient advised to sign a new pain contract. She will stop by soon to sign the contract.

## 2019-04-17 ENCOUNTER — Other Ambulatory Visit: Payer: Self-pay | Admitting: Family Medicine

## 2019-04-19 ENCOUNTER — Ambulatory Visit (INDEPENDENT_AMBULATORY_CARE_PROVIDER_SITE_OTHER): Payer: Medicare Other | Admitting: Family Medicine

## 2019-04-19 ENCOUNTER — Other Ambulatory Visit: Payer: Self-pay

## 2019-04-19 ENCOUNTER — Encounter: Payer: Self-pay | Admitting: *Deleted

## 2019-04-19 ENCOUNTER — Encounter: Payer: Self-pay | Admitting: Family Medicine

## 2019-04-19 VITALS — BP 120/63 | HR 68 | Ht 62.0 in | Wt 152.0 lb

## 2019-04-19 DIAGNOSIS — R7309 Other abnormal glucose: Secondary | ICD-10-CM | POA: Diagnosis not present

## 2019-04-19 DIAGNOSIS — N3941 Urge incontinence: Secondary | ICD-10-CM

## 2019-04-19 DIAGNOSIS — M545 Low back pain, unspecified: Secondary | ICD-10-CM

## 2019-04-19 DIAGNOSIS — F331 Major depressive disorder, recurrent, moderate: Secondary | ICD-10-CM

## 2019-04-19 DIAGNOSIS — Z23 Encounter for immunization: Secondary | ICD-10-CM | POA: Diagnosis not present

## 2019-04-19 DIAGNOSIS — G8929 Other chronic pain: Secondary | ICD-10-CM | POA: Diagnosis not present

## 2019-04-19 DIAGNOSIS — N3281 Overactive bladder: Secondary | ICD-10-CM | POA: Diagnosis not present

## 2019-04-19 LAB — POCT URINALYSIS DIP (CLINITEK)
Bilirubin, UA: NEGATIVE
Blood, UA: NEGATIVE
Glucose, UA: NEGATIVE mg/dL
Ketones, POC UA: NEGATIVE mg/dL
Leukocytes, UA: NEGATIVE
Nitrite, UA: NEGATIVE
POC PROTEIN,UA: NEGATIVE
Spec Grav, UA: 1.025 (ref 1.010–1.025)
Urobilinogen, UA: 0.2 E.U./dL
pH, UA: 6 (ref 5.0–8.0)

## 2019-04-19 LAB — POCT GLYCOSYLATED HEMOGLOBIN (HGB A1C): Hemoglobin A1C: 5.5 % (ref 4.0–5.6)

## 2019-04-19 MED ORDER — QUETIAPINE FUMARATE 100 MG PO TABS: 150.0000 mg | ORAL_TABLET | Freq: Every day | ORAL | 3 refills | Status: DC

## 2019-04-19 MED ORDER — MIRABEGRON ER 50 MG PO TB24: 50.0000 mg | ORAL_TABLET | Freq: Every day | ORAL | 1 refills | Status: DC

## 2019-04-19 NOTE — Progress Notes (Signed)
Established Patient Office Visit  Subjective:  Patient ID: Annette Ramirez, female    DOB: 09/20/1945  Age: 73 y.o. MRN: [id number]  CC:  Chief Complaint  Patient presents with  . chronic back pain    HPI Annette Ramirez presents for follow-up of chronic pain management for bilateral low back pain without sciatica.  She feels like her pain is stable.  She usually takes one of her oxycodone first thing in the morning.  Most the time that gets her till about 4:00 in the afternoon.  Usually that is enough to get through the bulk of her day. Denies any constipation.   She also reports that over the last month she has had a few episodes of incontinence which is fairly new and she is also had some darker looking urine than she is used to seeing.  She said she will notice an occasional burning but it is not frequent.  No fevers or chills. She says has had a few accident where didn't make it to the bathroom in time. Has been wearing a pad.    She has not had any more of the transient episodes of questionable syncope.  She really thinks that it was due to the stress that she was under at the time.  Please see previous note.  Her daughter is overall doing much better.  Past Medical History:  Diagnosis Date  . Anxiety   . Asthma   . COPD (chronic obstructive pulmonary disease) ([place])   . Depression   . Exertional shortness of breath   . Family history of anesthesia complication    "granddaughter has PONV" (02/28/2013)  . Fibromyalgia   . GERD (gastroesophageal reflux disease)   . H/O hiatal hernia   . Hypertension   . Migraines    "in the past; they've stopped" (02/28/2013)  . Opiate use 06/06/2017  . Osteoporosis   . PONV (postoperative nausea and vomiting)   . Sinus headache     Past Surgical History:  Procedure Laterality Date  . ABDOMINAL HYSTERECTOMY  1975  . APPENDECTOMY  1975  . BREAST CYST ASPIRATION Left 1980's  . COLONOSCOPY W/ BIOPSIES AND POLYPECTOMY  2012  . DILATION  AND CURETTAGE OF UTERUS  1960's  . EXCISIONAL HEMORRHOIDECTOMY  1980's  . LUMBAR LAMINECTOMY  02/28/2013  . LUMBAR LAMINECTOMY/DECOMPRESSION MICRODISCECTOMY N/A 02/28/2013   Procedure: LUMBAR LAMINECTOMY/DECOMPRESSION MICRODISCECTOMY/Lumbar 4-5 decompression;  Surgeon: Sinclair Ship, MD;  Location: [facility];  Service: Orthopedics;  Laterality: N/A;  Lumbar 4-5 decompression  . TONSILLECTOMY  1950  . TUBAL LIGATION  1972    Family History  Problem Relation Age of Onset  . Alzheimer's disease Mother   . Prostate cancer Other        grandfather  . Stroke Other        grandparent    Social History   Socioeconomic History  . Marital status: Married    Spouse name: [name]  . Number of children: 2  . Years of education: 34  . Highest education level: Some college, no degree  Occupational History  . Occupation: worked for attorney    Comment: retired  Tobacco Use  . Smoking status: Current Some Day Smoker    Packs/day: 0.25    Years: 40.00    Pack years: 10.00    Types: Cigarettes  . Smokeless tobacco: Never Used  . Tobacco comment: 02/28/2013 "started smoking eletronic cigarettes a couple years ago"  Substance and Sexual Activity  . Alcohol use:  Yes    Alcohol/week: 5.0 standard drinks    Types: 5 Glasses of wine per week    Comment: occasionally social  . Drug use: No  . Sexual activity: Not Currently  Other Topics Concern  . 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   Reads a lot during the day. Enjoys shopping.   Caffeine in the morning- 1 cup f coffee   Social Determinants of Health   Financial Resource Strain: Low Risk   . Difficulty of Paying Living Expenses: Not hard at all  Food Insecurity: No Food Insecurity  . Worried About Charity fundraiser in the Last Year: Never true  . Ran Out of Food in the Last Year: Never true  Transportation Needs: No Transportation Needs  . Lack of Transportation (Medical): No  . Lack of  Transportation (Non-Medical): No  Physical Activity: Inactive  . Days of Exercise per Week: 0 days  . Minutes of Exercise per Session: 0 min  Stress: No Stress Concern Present  . Feeling of Stress : Not at all  Social Connections: Slightly Isolated  . Frequency of Communication with Friends and Family: Three times a week  . Frequency of Social Gatherings with Friends and Family: Once a week  . Attends Religious Services: More than 4 times per year  . Active Member of Clubs or Organizations: No  . Attends Archivist Meetings: Never  . Marital Status: Married  Human resources officer Violence: Unknown  . Fear of Current or Ex-Partner: No  . Emotionally Abused: No  . Physically Abused: No  . Sexually Abused: Not on file    Outpatient Medications Prior to Visit  Medication Sig Dispense Refill  . albuterol (PROVENTIL HFA;VENTOLIN HFA) 108 (90 Base) MCG/ACT inhaler Inhale 2 puffs into the lungs every 6 (six) hours as needed for wheezing. 54 g 3  . amitriptyline (ELAVIL) 25 MG tablet TAKE 1 TABLET BY MOUTH AT  BEDTIME 90 tablet 1  . amLODipine (NORVASC) 5 MG tablet TAKE ONE TABLET BY MOUTH EVERY DAY 90 tablet 1  . atenolol (TENORMIN) 100 MG tablet Take 1 tablet (100 mg total) by mouth 2 (two) times daily. 180 tablet 1  . atorvastatin (LIPITOR) 20 MG tablet TAKE 1 TABLET BY MOUTH  DAILY 90 tablet 3  . FLUoxetine (PROZAC) 40 MG capsule Take 1 capsule (40 mg total) by mouth daily. 90 capsule 1  . gabapentin (NEURONTIN) 100 MG capsule TAKE ONE CAPSULE BY MOUTH EVERY MORNING, ONE CAPSULE AT NOON, THEN THREE CAPSULES AT BEDTIME 450 capsule 3  . loperamide (IMODIUM) 2 MG capsule TAKE ONE CAPSULE BY MOUTH FOUR TIMES A DAY AS NEEDED FOR DIARRHEA.  1  . losartan (COZAAR) 100 MG tablet Take 1 tablet (100 mg total) by mouth daily. 90 tablet 3  . meclizine (ANTIVERT) 25 MG tablet TAKE 1 TABLET BY MOUTH 3  TIMES DAILY AS NEEDED FOR  DIZZINESS 180 tablet 1  . omeprazole (PRILOSEC) 40 MG capsule Take 1  capsule (40 mg total) by mouth daily. 90 capsule 3  . oxyCODONE-acetaminophen (PERCOCET/ROXICET) 5-325 MG tablet Take 1 tablet by mouth every 6 (six) hours as needed. for pain 60 tablet 0  . promethazine (PHENERGAN) 25 MG tablet Take 1 tablet (25 mg total) by mouth every 8 (eight) hours as needed for nausea or vomiting. 30 tablet 0  . Tiotropium Bromide Monohydrate (SPIRIVA RESPIMAT) 2.5 MCG/ACT AERS Inhale 2 puffs into the lungs every morning. 12 g  PRN  . mirabegron ER (MYRBETRIQ) 25 MG TB24 tablet Take 1 tablet (25 mg total) by mouth daily. 90 tablet 3  . QUEtiapine (SEROQUEL) 100 MG tablet TAKE 1 TABLET BY MOUTH AT  BEDTIME 90 tablet 3   No facility-administered medications prior to visit.    Allergies  Allergen Reactions  . Amoxicillin-Pot Clavulanate     REACTION: nausea and abdominal pain  . Aspirin     REACTION: upset stomach  . Codeine     REACTION: nausea  . Hctz [Hydrochlorothiazide] Other (See Comments)    Hyponatremia  . Nsaids Nausea Only    ROS Review of Systems    Objective:    Physical Exam  Constitutional: She is oriented to person, place, and time. She appears well-developed and well-nourished.  HENT:  Head: Normocephalic and atraumatic.  Cardiovascular: Normal rate, regular rhythm and normal heart sounds.  Pulmonary/Chest: Effort normal and breath sounds normal.  Musculoskeletal:     Comments: Non tender lumbar spine. Tender over the paraspinous muscle. Negative straight leg raise.    Neurological: She is alert and oriented to person, place, and time.  Patellar reflex 2+ on the left and 1+ on the right.    Skin: Skin is warm and dry.  Psychiatric: She has a normal mood and affect. Her behavior is normal.    BP 120/63   Pulse 68   Ht 5\' 2"  (1.575 m)   Wt 152 lb (68.9 kg)   SpO2 93%   BMI 27.80 kg/m  Wt Readings from Last 3 Encounters:  04/19/19 152 lb (68.9 kg)  03/21/19 149 lb (67.6 kg)  11/06/18 145 lb (65.8 kg)     Health Maintenance Due   Topic Date Due  . COLONOSCOPY  10/03/2018    There are no preventive care reminders to display for this patient.  Lab Results  Component Value Date   TSH 2.42 09/29/2017   Lab Results  Component Value Date   WBC 7.1 03/21/2019   HGB 12.8 03/21/2019   HCT 37.6 03/21/2019   MCV 96.7 03/21/2019   PLT 206 03/21/2019   Lab Results  Component Value Date   NA 135 03/21/2019   K 4.7 03/21/2019   CO2 28 03/21/2019   GLUCOSE 99 03/21/2019   BUN 11 03/21/2019   CREATININE 0.82 03/21/2019   BILITOT 0.6 03/21/2019   ALKPHOS 58 12/09/2016   AST 16 03/21/2019   ALT 8 03/21/2019   PROT 6.7 03/21/2019   ALBUMIN 4.5 12/09/2016   CALCIUM 9.6 03/21/2019   Lab Results  Component Value Date   CHOL 177 03/21/2019   Lab Results  Component Value Date   HDL 80 03/21/2019   Lab Results  Component Value Date   LDLCALC 73 03/21/2019   Lab Results  Component Value Date   TRIG 159 (H) 03/21/2019   Lab Results  Component Value Date   CHOLHDL 2.2 03/21/2019   Lab Results  Component Value Date   HGBA1C 5.5 04/19/2019      Assessment & Plan:   Problem List Items Addressed This Visit      Genitourinary   OAB (overactive bladder)    We discussed referral to Urology since she is having incontinence now.  Her UA was normal. No sign of infection.  She wants to try going up on her mirbetrriq first. New dose sent for 50mg .        Relevant Medications   mirabegron ER (MYRBETRIQ) 50 MG TB24 tablet   Other Relevant  Orders   POCT URINALYSIS DIP (CLINITEK) (Completed)     Other   Encounter for chronic pain management - Primary    Indication for chronic opioid:chronic bilateral low back pain Medication and dose:Oxycodone 09/03/2023, 1 tab twice a day as needed. # pills per month:60 Last UDS date:04/19/2019 Opioid Treatment Agreement signed (Y/N):Y Opioid Treatment Agreement last reviewed with patient: N[facility]SRS reviewed this encounter (include red flags):Yes      Relevant  Orders   Pain Mgmt, Profile 5 w/o medMatch U   Depression   Chronic lower back pain   Relevant Orders   Pain Mgmt, Profile 5 w/o medMatch U    Other Visit Diagnoses    Abnormal glucose       Relevant Orders   POCT HgB A1C (Completed)   Need for immunization against influenza       Relevant Orders   Flu Vaccine QUAD High Dose(Fluad) (Completed)   Urge incontinence of urine       Relevant Medications   mirabegron ER (MYRBETRIQ) 50 MG TB24 tablet      Meds ordered this encounter  Medications  . mirabegron ER (MYRBETRIQ) 50 MG TB24 tablet    Sig: Take 1 tablet (50 mg total) by mouth daily.    Dispense:  90 tablet    Refill:  1  . QUEtiapine (SEROQUEL) 100 MG tablet    Sig: Take 1.5 tablets (150 mg total) by mouth at bedtime.    Dispense:  135 tablet    Refill:  3    Requesting 1 year supply    Follow-up: Return in about 3 months (around 07/18/2019) for Chronic pain management.Beatrice Lecher, MD

## 2019-04-19 NOTE — Assessment & Plan Note (Signed)
We discussed referral to Urology since she is having incontinence now.  Her UA was normal. No sign of infection.  She wants to try going up on her mirbetrriq first. New dose sent for 50mg .

## 2019-04-19 NOTE — Patient Instructions (Signed)
Please remember to schedule your mammogram

## 2019-04-19 NOTE — Assessment & Plan Note (Signed)
Indication for chronic opioid:chronic bilateral low back pain Medication and dose:Oxycodone 09/03/2023, 1 tab twice a day as needed. # pills per month:60 Last UDS date:04/19/2019 Opioid Treatment Agreement signed (Y/N):Y Opioid Treatment Agreement last reviewed with patient: N[facility]SRS reviewed this encounter (include red flags):Yes

## 2019-04-21 LAB — PAIN MGMT, PROFILE 5 W/O MEDMATCH U
Amphetamines: NEGATIVE ng/mL
Barbiturates: NEGATIVE ng/mL
Benzodiazepines: NEGATIVE ng/mL
Cocaine Metabolite: NEGATIVE ng/mL
Codeine: NEGATIVE ng/mL
Creatinine: 218 mg/dL
Hydrocodone: NEGATIVE ng/mL
Hydromorphone: NEGATIVE ng/mL
Marijuana Metabolite: NEGATIVE ng/mL
Methadone Metabolite: NEGATIVE ng/mL
Morphine: NEGATIVE ng/mL
Norhydrocodone: NEGATIVE ng/mL
Noroxycodone: 3851 ng/mL
Opiates: NEGATIVE ng/mL
Oxidant: NEGATIVE ug/mL
Oxycodone: 3290 ng/mL
Oxycodone: POSITIVE ng/mL
Oxymorphone: 113 ng/mL
pH: 5.8 (ref 4.5–9.0)

## 2019-04-30 IMAGING — DX DG CHEST 2V
2 series · 2 of 2 positions shown · non-contrast
Comparison: 03/15/2015

CLINICAL DATA: Nausea, vomiting and hyponatremia.

EXAM:
CHEST  2 VIEW

[chest pa]
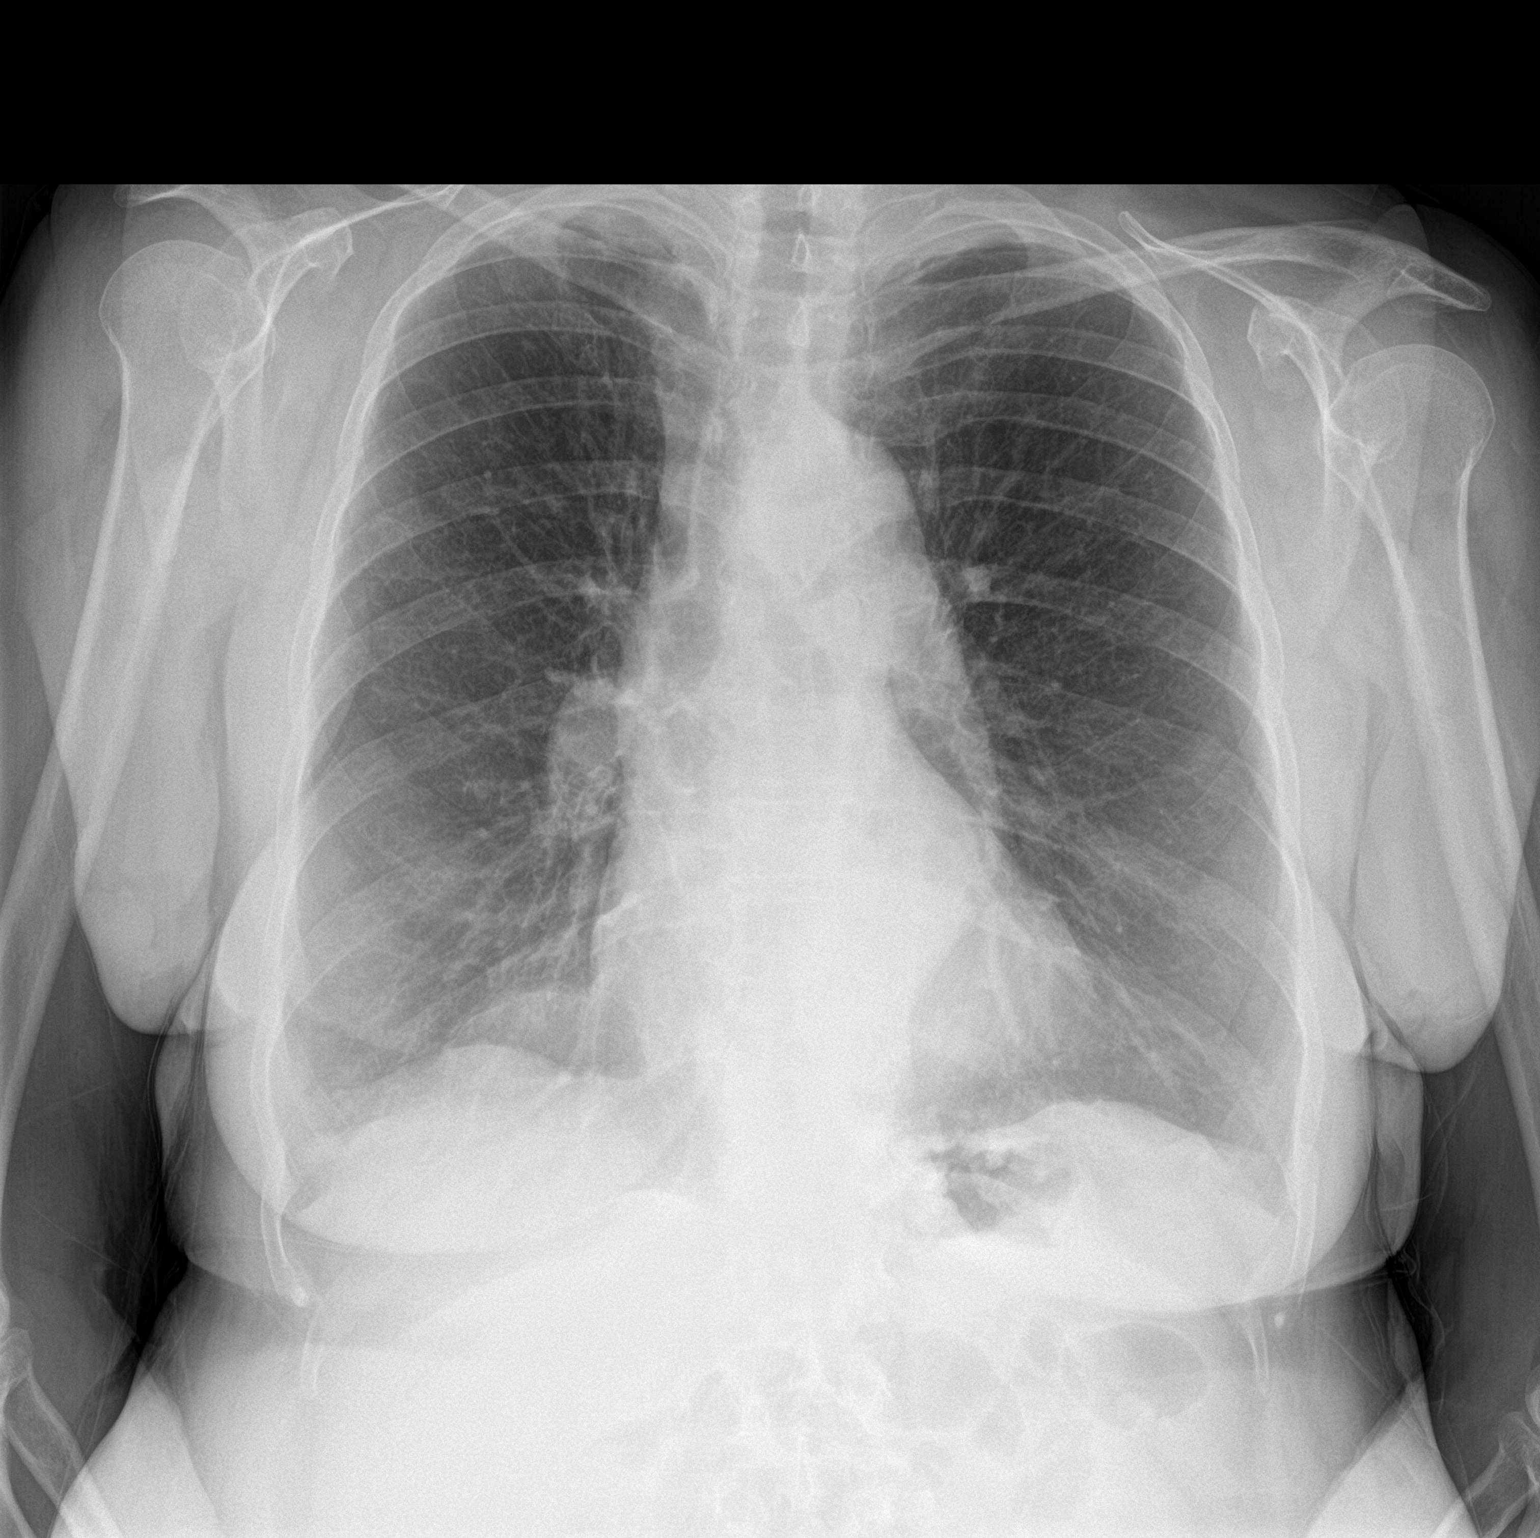

[chest lat]
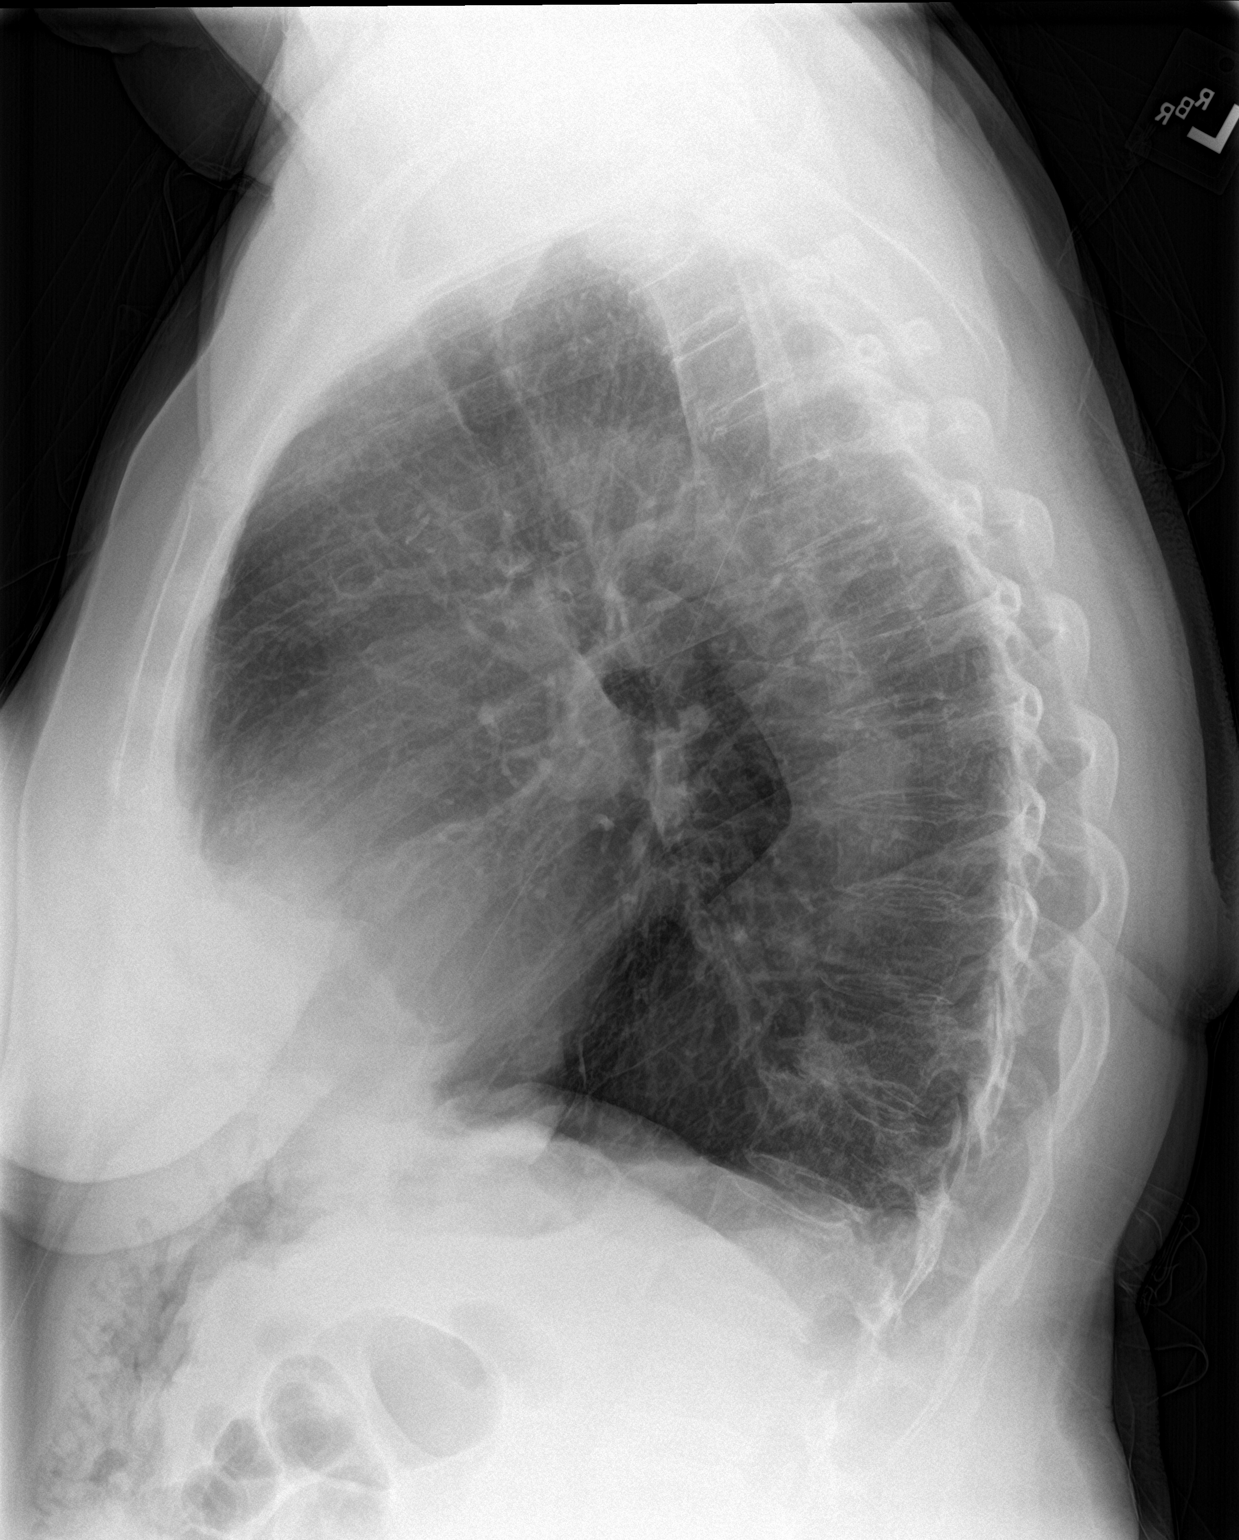

[2 of 2 positions shown; findings below may reference images not displayed]

FINDINGS: The heart size is normal. There is stable tortuosity of the thoracic
aorta. There is no evidence of pulmonary edema, consolidation,
pneumothorax, nodule or pleural fluid. The thoracic spine
demonstrates osteopenia and mild degenerative disc disease.
IMPRESSION: No active cardiopulmonary disease.

## 2019-05-03 ENCOUNTER — Other Ambulatory Visit: Payer: Self-pay | Admitting: Family Medicine

## 2019-05-03 DIAGNOSIS — G8929 Other chronic pain: Secondary | ICD-10-CM

## 2019-05-03 DIAGNOSIS — M545 Low back pain, unspecified: Secondary | ICD-10-CM

## 2019-05-03 MED ORDER — OXYCODONE-ACETAMINOPHEN 5-325 MG PO TABS: 1.0000 | ORAL_TABLET | Freq: Four times a day (QID) | ORAL | 0 refills | Status: DC | PRN

## 2019-05-03 NOTE — Telephone Encounter (Signed)
Patient is aware and did not have any questions.  

## 2019-05-03 NOTE — Telephone Encounter (Signed)
Patient called and would like a refill of her Oxycodone. Please advise.

## 2019-05-18 DIAGNOSIS — R197 Diarrhea, unspecified: Secondary | ICD-10-CM | POA: Diagnosis not present

## 2019-05-18 DIAGNOSIS — R110 Nausea: Secondary | ICD-10-CM | POA: Diagnosis not present

## 2019-05-23 DIAGNOSIS — K635 Polyp of colon: Secondary | ICD-10-CM | POA: Diagnosis not present

## 2019-05-23 DIAGNOSIS — D123 Benign neoplasm of transverse colon: Secondary | ICD-10-CM | POA: Diagnosis not present

## 2019-05-23 DIAGNOSIS — D122 Benign neoplasm of ascending colon: Secondary | ICD-10-CM | POA: Diagnosis not present

## 2019-05-23 DIAGNOSIS — Z86010 Personal history of colonic polyps: Secondary | ICD-10-CM | POA: Diagnosis not present

## 2019-05-23 LAB — HM COLONOSCOPY

## 2019-06-04 ENCOUNTER — Other Ambulatory Visit: Payer: Self-pay | Admitting: Family Medicine

## 2019-06-07 DIAGNOSIS — E782 Mixed hyperlipidemia: Secondary | ICD-10-CM | POA: Insufficient documentation

## 2019-06-10 ENCOUNTER — Other Ambulatory Visit: Payer: Self-pay | Admitting: Family Medicine

## 2019-06-10 DIAGNOSIS — L659 Nonscarring hair loss, unspecified: Secondary | ICD-10-CM

## 2019-06-10 DIAGNOSIS — K591 Functional diarrhea: Secondary | ICD-10-CM

## 2019-06-15 ENCOUNTER — Other Ambulatory Visit: Payer: Self-pay

## 2019-06-15 DIAGNOSIS — M545 Low back pain, unspecified: Secondary | ICD-10-CM

## 2019-06-15 DIAGNOSIS — G8929 Other chronic pain: Secondary | ICD-10-CM

## 2019-06-15 MED ORDER — OXYCODONE-ACETAMINOPHEN 5-325 MG PO TABS: 1.0000 | ORAL_TABLET | Freq: Four times a day (QID) | ORAL | 0 refills | Status: DC | PRN

## 2019-06-23 ENCOUNTER — Ambulatory Visit: Payer: Medicare Other | Attending: Internal Medicine

## 2019-06-23 DIAGNOSIS — Z23 Encounter for immunization: Secondary | ICD-10-CM | POA: Insufficient documentation

## 2019-06-23 NOTE — Progress Notes (Signed)
   Covid-19 Vaccination Clinic  Name:  Annette Ramirez    MRN: [id number] DOB: March 24, 1946  06/23/2019  [name] was observed post Covid-19 immunization for 30 minutes based on pre-vaccination screening without incidence. She was provided with Vaccine Information Sheet and instruction to access the V-Safe system.   [name] was instructed to call 911 with any severe reactions post vaccine: Marland Kitchen Difficulty breathing  . Swelling of your face and throat  . A fast heartbeat  . A bad rash all over your body  . Dizziness and weakness    Immunizations Administered    Name Date Dose VIS Date Route   Pfizer COVID-19 Vaccine 06/23/2019  3:29 PM 0.3 mL 04/13/2019 Intramuscular   Manufacturer: [place]   Lot: [id number]   [place]: [id number]

## 2019-07-10 NOTE — Progress Notes (Signed)
Subjective:   Annette Ramirez is a 74 y.o. female who presents for Medicare Annual (Subsequent) preventive examination.  Review of Systems:  No ROS.  Medicare Wellness Virtual Visit.  Visual/audio telehealth visit, UTA vital signs.   See social history for additional risk factors.    Cardiac Risk Factors include: advanced age (>80men, >54 women);Other (see comment);sedentary lifestyle;hypertension, Risk factor comments: COPD Sleep patterns:  Getting 5 hours of sleep a night.  Wakes up 1 time to void. Wakes up and feels sluggish. Home Safety/Smoke Alarms: Feels safe in home. Smoke alarms in place.  Living environment; Lives with husband in 1 story home and stairs have hand rails on them. Shower is walk in shower and grab bars in place. Seat Belt Safety/Bike Helmet: Wears seat belt.   Female:   Pap-  Aged out     Mammo- UTD      Dexa scan-  Ordered while at visit  CCS-UTD    Objective:     Vitals: BP 102/86   Pulse 90   Ht 5\' 2"  (1.575 m)   Wt 153 lb (69.4 kg)   SpO2 96%   BMI 27.98 kg/m   Body mass index is 27.98 kg/m.  Advanced Directives 07/16/2019 07/11/2018 10/01/2013 02/28/2013 02/28/2013 02/22/2013  Does Patient Have a Medical Advance Directive? Yes Yes Patient has advance directive, copy not in chart - Patient has advance directive, copy not in chart Patient has advance directive, copy not in chart  Type of Advance Directive [place];Living will [place];Living will [place];Living will Advance instruction for mental health [place];Living will  Does patient want to make changes to medical advance directive? No - Patient declined No - Patient declined - - - -  Copy of [place] in Chart? No - copy requested No - copy requested - - - -  Pre-existing out of facility DNR order (yellow form or pink MOST form) - - - - - No    Tobacco Social History   Tobacco Use   Smoking Status Current Some Day Smoker  . Packs/day: 0.25  . Years: 40.00  . Pack years: 10.00  . Types: Cigarettes  Smokeless Tobacco Never Used  Tobacco Comment   02/28/2013 "started smoking eletronic cigarettes a couple years ago"     Ready to quit: No Counseling given: Not Answered Comment: 02/28/2013 "started smoking eletronic cigarettes a couple years ago"   Clinical Intake:  Pre-visit preparation completed: Yes  Pain : 0-10 Pain Score: 7  Pain Type: Chronic pain Pain Location: Back Pain Orientation: Lower Pain Descriptors / Indicators: Throbbing, Stabbing, Burning, Aching Pain Onset: More than a month ago Pain Frequency: Constant Pain Relieving Factors: pain meds and gabapentin Effect of Pain on Daily Activities: daily  Pain Relieving Factors: pain meds and gabapentin  Nutritional Risks: None Diabetes: No  How often do you need to have someone help you when you read instructions, pamphlets, or other written materials from your doctor or pharmacy?: 1 - Never What is the last grade level you completed in school?: 13  Interpreter Needed?: No  Information entered by :: Orlie Dakin, LPN  Past Medical History:  Diagnosis Date  . Anxiety   . Asthma   . COPD (chronic obstructive pulmonary disease) ([place])   . Depression   . Exertional shortness of breath   . Family history of anesthesia complication    "granddaughter has PONV" (02/28/2013)  . Fibromyalgia   .  GERD (gastroesophageal reflux disease)   . H/O hiatal hernia   . Hypertension   . Migraines    "in the past; they've stopped" (02/28/2013)  . Opiate use 06/06/2017  . Osteoporosis   . PONV (postoperative nausea and vomiting)   . Sinus headache    Past Surgical History:  Procedure Laterality Date  . ABDOMINAL HYSTERECTOMY  1975  . APPENDECTOMY  1975  . BREAST CYST ASPIRATION Left 1980's  . COLONOSCOPY W/ BIOPSIES AND POLYPECTOMY  2012  . DILATION AND CURETTAGE OF UTERUS  1960's  . EXCISIONAL  HEMORRHOIDECTOMY  1980's  . LUMBAR LAMINECTOMY  02/28/2013  . LUMBAR LAMINECTOMY/DECOMPRESSION MICRODISCECTOMY N/A 02/28/2013   Procedure: LUMBAR LAMINECTOMY/DECOMPRESSION MICRODISCECTOMY/Lumbar 4-5 decompression;  Surgeon: Sinclair Ship, MD;  Location: [facility];  Service: Orthopedics;  Laterality: N/A;  Lumbar 4-5 decompression  . TONSILLECTOMY  1950  . TUBAL LIGATION  1972   Family History  Problem Relation Age of Onset  . Alzheimer's disease Mother   . Prostate cancer Other        grandfather  . Stroke Other        grandparent   Social History   Socioeconomic History  . Marital status: Married    Spouse name: [name]  . Number of children: 2  . Years of education: 34  . Highest education level: Some college, no degree  Occupational History  . Occupation: worked for attorney    Comment: retired  Tobacco Use  . Smoking status: Current Some Day Smoker    Packs/day: 0.25    Years: 40.00    Pack years: 10.00    Types: Cigarettes  . Smokeless tobacco: Never Used  . Tobacco comment: 02/28/2013 "started smoking eletronic cigarettes a couple years ago"  Substance and Sexual Activity  . Alcohol use: Yes    Alcohol/week: 5.0 standard drinks    Types: 5 Glasses of wine per week    Comment: occasionally social  . Drug use: No  . Sexual activity: Not Currently  Other Topics Concern  . 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   Reads a lot during the day. Enjoys shopping.   Caffeine in the morning- 1 cup f coffee   Social Determinants of Health   Financial Resource Strain:   . Difficulty of Paying Living Expenses:   Food Insecurity:   . Worried About Charity fundraiser in the Last Year:   . Arboriculturist in the Last Year:   Transportation Needs:   . Film/video editor (Medical):   Marland Kitchen Lack of Transportation (Non-Medical):   Physical Activity:   . Days of Exercise per Week:   . Minutes of Exercise per Session:   Stress:   . Feeling of  Stress :   Social Connections:   . Frequency of Communication with Friends and Family:   . Frequency of Social Gatherings with Friends and Family:   . Attends Religious Services:   . Active Member of Clubs or Organizations:   . Attends Archivist Meetings:   Marland Kitchen Marital Status:     Outpatient Encounter Medications as of 07/16/2019  Medication Sig  . albuterol (PROVENTIL HFA;VENTOLIN HFA) 108 (90 Base) MCG/ACT inhaler Inhale 2 puffs into the lungs every 6 (six) hours as needed for wheezing.  Marland Kitchen amitriptyline (ELAVIL) 25 MG tablet TAKE 1 TABLET BY MOUTH AT  BEDTIME  . amLODipine (NORVASC) 5 MG tablet TAKE ONE TABLET BY MOUTH EVERY DAY  .  atenolol (TENORMIN) 100 MG tablet Take 1 tablet (100 mg total) by mouth 2 (two) times daily.  Marland Kitchen atorvastatin (LIPITOR) 20 MG tablet TAKE 1 TABLET BY MOUTH  DAILY  . FLUoxetine (PROZAC) 40 MG capsule TAKE 1 CAPSULE BY MOUTH  DAILY  . gabapentin (NEURONTIN) 100 MG capsule TAKE ONE CAPSULE BY MOUTH EVERY MORNING, ONE CAPSULE AT NOON, THEN THREE CAPSULES AT BEDTIME  . loperamide (IMODIUM) 2 MG capsule TAKE ONE CAPSULE BY MOUTH FOUR TIMES A DAY AS NEEDED FOR DIARRHEA.  Marland Kitchen losartan (COZAAR) 100 MG tablet Take 1 tablet (100 mg total) by mouth daily.  . meclizine (ANTIVERT) 25 MG tablet TAKE 1 TABLET BY MOUTH 3  TIMES DAILY AS NEEDED FOR  DIZZINESS  . mirabegron ER (MYRBETRIQ) 50 MG TB24 tablet Take 1 tablet (50 mg total) by mouth daily.  Marland Kitchen omeprazole (PRILOSEC) 40 MG capsule Take 1 capsule (40 mg total) by mouth daily.  Marland Kitchen oxyCODONE-acetaminophen (PERCOCET/ROXICET) 5-325 MG tablet Take 1 tablet by mouth every 6 (six) hours as needed. for pain  . promethazine (PHENERGAN) 25 MG tablet Take 1 tablet (25 mg total) by mouth every 8 (eight) hours as needed for nausea or vomiting.  Marland Kitchen QUEtiapine (SEROQUEL) 100 MG tablet Take 1.5 tablets (150 mg total) by mouth at bedtime.  . Tiotropium Bromide Monohydrate (SPIRIVA RESPIMAT) 2.5 MCG/ACT AERS Inhale 2 puffs into the  lungs every morning.   No facility-administered encounter medications on file as of 07/16/2019.    Activities of Daily Living In your present state of health, do you have any difficulty performing the following activities: 07/16/2019  Hearing? Y  Comment has noticed some hearing loss in right ear  Vision? N  Difficulty concentrating or making decisions? N  Walking or climbing stairs? N  Dressing or bathing? N  Doing errands, shopping? N  Preparing Food and eating ? N  Using the Toilet? N  In the past six months, have you accidently leaked urine? Y  Comment always leaking- Myrbetriq helping  Do you have problems with loss of bowel control? N  Managing your Medications? N  Managing your Finances? N  Housekeeping or managing your Housekeeping? N  Some recent data might be hidden    Patient Care Team: Hali Marry, MD as PCP - General (Family Medicine) Darlis Loan, MD as Referring Physician (Gastroenterology)    Assessment:   This is a routine wellness examination for [place].Physical assessment deferred to PCP.   Exercise Activities and Dietary recommendations Current Exercise Habits: The patient does not participate in regular exercise at present, Exercise limited by: orthopedic condition(s) Diet Eats a healthy diet of fruits, vegetables and proteins. Breakfast: Cereal with banana Lunch: cottage cheese with crackers or tuna Dinner: Meat and vegetables Drinks water daily      Goals    . Exercise 3x per week (30 min per time)     Patient states would like to increase exercise and able to have more mobility.    . Patient Stated     Patient states would like to be able to sleep all night. Does not sleep well        Fall Risk Fall Risk  07/16/2019 07/11/2018 12/19/2017 07/13/2016 10/28/2015  Falls in the past year? 1 1 No No No  Number falls in past yr: 1 0 - - -  Injury with Fall? 0 0 - - -  Risk for fall due to : Impaired balance/gait;History of fall(s) - - - -   Follow up Falls  prevention discussed Falls prevention discussed - - -   Is the patient's home free of loose throw rugs in walkways, pet beds, electrical cords, etc?   yes      Grab bars in the bathroom? yes      Handrails on the stairs?   yes      Adequate lighting?   yes   Depression Screen PHQ 2/9 Scores 07/16/2019 08/22/2018 07/11/2018 05/18/2018  PHQ - 2 Score 0 1 1 2   PHQ- 9 Score - 6 4 4      Cognitive Function     6CIT Screen 07/16/2019 07/11/2018  What Year? 0 points 0 points  What month? 0 points 0 points  What time? 0 points 0 points  Count back from 20 0 points 0 points  Months in reverse 0 points 0 points  Repeat phrase 2 points 0 points  Total Score 2 0    Immunization History  Administered Date(s) Administered  . Fluad Quad(high Dose 65+) 04/19/2019  . Influenza Split 03/22/2012  . Influenza Whole 03/10/2006, 02/01/2008  . Influenza, High Dose Seasonal PF 01/06/2017, 02/01/2018  . Influenza,inj,Quad PF,6+ Mos 03/01/2013, 02/14/2014, 12/27/2014, 02/19/2016, 12/19/2017  . PFIZER SARS-COV-2 Vaccination 06/23/2019, 07/16/2019  . Pneumococcal Conjugate-13 10/01/2013  . Pneumococcal Polysaccharide-23 09/25/2008  . Td 05/03/2004  . Tdap 05/13/2015  . Zoster 05/30/2015    Screening Tests Health Maintenance  Topic Date Due  . COLONOSCOPY  05/22/2022  . TETANUS/TDAP  05/12/2025  . INFLUENZA VACCINE  Completed  . Hepatitis C Screening  Completed  . PNA vac Low Risk Adult  Completed        Plan:    Please schedule your next medicare wellness visit with me in 1 yr.  Ms. Burkey , Thank you for taking time to come for your Medicare Wellness Visit. I appreciate your ongoing commitment to your health goals. Please review the following plan we discussed and let me know if I can assist you in the future.  Continue doing brain stimulating activities (puzzles, reading, adult coloring books, staying active) to keep memory sharp.  Bring a copy of your living will and/or  healthcare power of attorney to your next office visit.   These are the goals we discussed: Goals    . Exercise 3x per week (30 min per time)     Patient states would like to increase exercise and able to have more mobility.    . Patient Stated     Patient states would like to be able to sleep all night. Does not sleep well        This is a list of the screening recommended for you and due dates:  Health Maintenance  Topic Date Due  . Colon Cancer Screening  05/22/2022  . Tetanus Vaccine  05/12/2025  . Flu Shot  Completed  .  Hepatitis C: One time screening is recommended by Center for Disease Control  (CDC) for  adults born from [birth year] through 1967.   Completed  . Pneumonia vaccines  Completed      I have personally reviewed and noted the following in the patient's chart:   . Medical and social history . Use of alcohol, tobacco or illicit drugs  . Current medications and supplements . Functional ability and status . Nutritional status . Physical activity . Advanced directives . List of other physicians . Hospitalizations, surgeries, and ER visits in previous 12 months . Vitals . Screenings to include cognitive, depression, and falls . Referrals and appointments  In addition, I have reviewed and discussed with patient certain preventive protocols, quality metrics, and best practice recommendations. A written personalized care plan for preventive services as well as general preventive health recommendations were provided to patient.     Joanne Chars, LPN  [phone]

## 2019-07-12 ENCOUNTER — Encounter: Payer: Self-pay | Admitting: Family Medicine

## 2019-07-16 ENCOUNTER — Other Ambulatory Visit: Payer: Self-pay

## 2019-07-16 ENCOUNTER — Ambulatory Visit: Payer: Medicare Other | Attending: Internal Medicine

## 2019-07-16 ENCOUNTER — Ambulatory Visit (INDEPENDENT_AMBULATORY_CARE_PROVIDER_SITE_OTHER): Payer: Medicare Other | Admitting: *Deleted

## 2019-07-16 VITALS — BP 102/86 | HR 90 | Ht 62.0 in | Wt 153.0 lb

## 2019-07-16 DIAGNOSIS — Z780 Asymptomatic menopausal state: Secondary | ICD-10-CM | POA: Diagnosis not present

## 2019-07-16 DIAGNOSIS — Z13820 Encounter for screening for osteoporosis: Secondary | ICD-10-CM | POA: Diagnosis not present

## 2019-07-16 DIAGNOSIS — Z0000 Encounter for general adult medical examination without abnormal findings: Secondary | ICD-10-CM

## 2019-07-16 DIAGNOSIS — Z23 Encounter for immunization: Secondary | ICD-10-CM

## 2019-07-16 NOTE — Progress Notes (Signed)
   Covid-19 Vaccination Clinic  Name:  Annette Ramirez    MRN: [id number] DOB: [birth year]  07/16/2019  [name] was observed post Covid-19 immunization for 15 minutes without incident. She was provided with Vaccine Information Sheet and instruction to access the V-Safe system.   [name] was instructed to call 911 with any severe reactions post vaccine: Marland Kitchen Difficulty breathing  . Swelling of face and throat  . A fast heartbeat  . A bad rash all over body  . Dizziness and weakness   Immunizations Administered    Name Date Dose VIS Date Route   Pfizer COVID-19 Vaccine 07/16/2019 10:34 AM 0.3 mL 04/13/2019 Intramuscular   Manufacturer: [place]   Lot: [id number]   [place]: [id number]

## 2019-07-16 NOTE — Patient Instructions (Signed)
Please schedule your next medicare wellness visit with me in 1 yr.  Annette Ramirez , Thank you for taking time to come for your Medicare Wellness Visit. I appreciate your ongoing commitment to your health goals. Please review the following plan we discussed and let me know if I can assist you in the future.  Continue doing brain stimulating activities (puzzles, reading, adult coloring books, staying active) to keep memory sharp.  Bring a copy of your living will and/or healthcare power of attorney to your next office visit.  These are the goals we discussed: Goals    . Exercise 3x per week (30 min per time)     Patient states would like to increase exercise and able to have more mobility.    . Patient Stated     Patient states would like to be able to sleep all night. Does not sleep well

## 2019-07-19 ENCOUNTER — Ambulatory Visit (INDEPENDENT_AMBULATORY_CARE_PROVIDER_SITE_OTHER): Payer: Medicare Other | Admitting: Family Medicine

## 2019-07-19 ENCOUNTER — Encounter: Payer: Self-pay | Admitting: Family Medicine

## 2019-07-19 ENCOUNTER — Other Ambulatory Visit: Payer: Self-pay

## 2019-07-19 VITALS — BP 110/62 | HR 64 | Ht 62.0 in | Wt 153.0 lb

## 2019-07-19 DIAGNOSIS — M545 Low back pain, unspecified: Secondary | ICD-10-CM

## 2019-07-19 DIAGNOSIS — N3281 Overactive bladder: Secondary | ICD-10-CM | POA: Diagnosis not present

## 2019-07-19 DIAGNOSIS — R32 Unspecified urinary incontinence: Secondary | ICD-10-CM | POA: Diagnosis not present

## 2019-07-19 DIAGNOSIS — F324 Major depressive disorder, single episode, in partial remission: Secondary | ICD-10-CM

## 2019-07-19 DIAGNOSIS — G8929 Other chronic pain: Secondary | ICD-10-CM | POA: Diagnosis not present

## 2019-07-19 DIAGNOSIS — F5101 Primary insomnia: Secondary | ICD-10-CM | POA: Diagnosis not present

## 2019-07-19 MED ORDER — OXYCODONE-ACETAMINOPHEN 5-325 MG PO TABS: 1.0000 | ORAL_TABLET | Freq: Three times a day (TID) | ORAL | 0 refills | Status: DC | PRN

## 2019-07-19 MED ORDER — TOLTERODINE TARTRATE ER 2 MG PO CP24: 2.0000 mg | ORAL_CAPSULE | Freq: Every morning | ORAL | 1 refills | Status: DC

## 2019-07-19 MED ORDER — QUETIAPINE FUMARATE 200 MG PO TABS: 200.0000 mg | ORAL_TABLET | Freq: Every day | ORAL | 1 refills | Status: DC

## 2019-07-19 NOTE — Assessment & Plan Note (Addendum)
Doing OK overall.  HQ 9 score of 5 today.  Continue fluoxetine 40 mg and continue Seroquel at bedtime. Discussed the Calm App.  Really encouraged her to use it at night after she gets ready to go to bed to really relax a really would like for her to try the free version for 21 days just to see if she feels like it is helpful in helping her relax to be able to get to bed and go to sleep.

## 2019-07-19 NOTE — Assessment & Plan Note (Signed)
Having incontinence even on the 50 mg of Myrbetriq.  We will go ahead and refer her to urology for further work-up.  She may have prolapsed bladder etc.  There may be some additional causes.  We did discuss a trial of a second medication which she would like to do while she is waiting to get in with urology.

## 2019-07-19 NOTE — Progress Notes (Signed)
Established Patient Office Visit  Subjective:  Patient ID: Annette Ramirez, female    DOB: [birth year]  Age: 74 y.o. MRN: [id number]  CC:  Chief Complaint  Patient presents with  . PAIN MANAGEMENT    HPI Annette Ramirez presents for chronic pain management for bilateral low back pain without sciatica.  She feels like her pain is stable.  She usually takes one of her oxycodone first thing in the morning.  Most the time that gets her till about 4:00 in the afternoon.  Usually that is enough to get through the bulk of her day. Denies any constipation.   F/U OAB -actually increased her Myrbetriq in December to 50 mg she feels like it works well but unfortunately it is a little bit cost prohibitive and wants to know if there is some other options.  She was having significant incontinence symptoms.  She still having some incontinence episodes even with the medication.  We discussed future referral to urology if it persisted.  F/U depression  -she is actually doing okay overall.  She is also on Seroquel for her sleep and depression and takes it at bedtime.  She says really difficult to split the tabs to get 250 mg and she would rather just go up to 200 if it is okay.  She still has a lot of nights where she has difficulty falling asleep.  Past Medical History:  Diagnosis Date  . Anxiety   . Asthma   . COPD (chronic obstructive pulmonary disease) ([place])   . Depression   . Exertional shortness of breath   . Family history of anesthesia complication    "granddaughter has PONV" (02/28/2013)  . Fibromyalgia   . GERD (gastroesophageal reflux disease)   . H/O hiatal hernia   . Hypertension   . Migraines    "in the past; they've stopped" (02/28/2013)  . Opiate use 06/06/2017  . Osteoporosis   . PONV (postoperative nausea and vomiting)   . Sinus headache     Past Surgical History:  Procedure Laterality Date  . ABDOMINAL HYSTERECTOMY  1975  . APPENDECTOMY  1975  . BREAST CYST ASPIRATION  Left 1980's  . COLONOSCOPY W/ BIOPSIES AND POLYPECTOMY  2012  . DILATION AND CURETTAGE OF UTERUS  1960's  . EXCISIONAL HEMORRHOIDECTOMY  1980's  . LUMBAR LAMINECTOMY  02/28/2013  . LUMBAR LAMINECTOMY/DECOMPRESSION MICRODISCECTOMY N/A 02/28/2013   Procedure: LUMBAR LAMINECTOMY/DECOMPRESSION MICRODISCECTOMY/Lumbar 4-5 decompression;  Surgeon: Sinclair Ship, MD;  Location: [facility];  Service: Orthopedics;  Laterality: N/A;  Lumbar 4-5 decompression  . TONSILLECTOMY  1950  . TUBAL LIGATION  1972    Family History  Problem Relation Age of Onset  . Alzheimer's disease Mother   . Prostate cancer Other        grandfather  . Stroke Other        grandparent    Social History   Socioeconomic History  . Marital status: Married    Spouse name: [name]  . Number of children: 2  . Years of education: 61  . Highest education level: Some college, no degree  Occupational History  . Occupation: worked for attorney    Comment: retired  Tobacco Use  . Smoking status: Current Some Day Smoker    Packs/day: 0.25    Years: 40.00    Pack years: 10.00    Types: Cigarettes  . Smokeless tobacco: Never Used  . Tobacco comment: 02/28/2013 "started smoking eletronic cigarettes a couple years ago"  Substance and Sexual  Activity  . Alcohol use: Yes    Alcohol/week: 5.0 standard drinks    Types: 5 Glasses of wine per week    Comment: occasionally social  . Drug use: No  . Sexual activity: Not Currently  Other Topics Concern  . 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   Reads a lot during the day. Enjoys shopping.   Caffeine in the morning- 1 cup f coffee   Social Determinants of Health   Financial Resource Strain:   . Difficulty of Paying Living Expenses:   Food Insecurity:   . Worried About Charity fundraiser in the Last Year:   . Arboriculturist in the Last Year:   Transportation Needs:   . Film/video editor (Medical):   Marland Kitchen Lack of Transportation  (Non-Medical):   Physical Activity:   . Days of Exercise per Week:   . Minutes of Exercise per Session:   Stress:   . Feeling of Stress :   Social Connections:   . Frequency of Communication with Friends and Family:   . Frequency of Social Gatherings with Friends and Family:   . Attends Religious Services:   . Active Member of Clubs or Organizations:   . Attends Archivist Meetings:   Marland Kitchen Marital Status:   Intimate Partner Violence:   . Fear of Current or Ex-Partner:   . Emotionally Abused:   Marland Kitchen Physically Abused:   . Sexually Abused:     Outpatient Medications Prior to Visit  Medication Sig Dispense Refill  . albuterol (PROVENTIL HFA;VENTOLIN HFA) 108 (90 Base) MCG/ACT inhaler Inhale 2 puffs into the lungs every 6 (six) hours as needed for wheezing. 54 g 3  . amitriptyline (ELAVIL) 25 MG tablet TAKE 1 TABLET BY MOUTH AT  BEDTIME 90 tablet 1  . amLODipine (NORVASC) 5 MG tablet TAKE ONE TABLET BY MOUTH EVERY DAY 90 tablet 1  . atenolol (TENORMIN) 100 MG tablet Take 1 tablet (100 mg total) by mouth 2 (two) times daily. 180 tablet 1  . atorvastatin (LIPITOR) 20 MG tablet TAKE 1 TABLET BY MOUTH  DAILY 90 tablet 3  . FLUoxetine (PROZAC) 40 MG capsule TAKE 1 CAPSULE BY MOUTH  DAILY 90 capsule 3  . gabapentin (NEURONTIN) 100 MG capsule TAKE ONE CAPSULE BY MOUTH EVERY MORNING, ONE CAPSULE AT NOON, THEN THREE CAPSULES AT BEDTIME 450 capsule 3  . loperamide (IMODIUM) 2 MG capsule TAKE ONE CAPSULE BY MOUTH FOUR TIMES A DAY AS NEEDED FOR DIARRHEA.  1  . losartan (COZAAR) 100 MG tablet Take 1 tablet (100 mg total) by mouth daily. 90 tablet 3  . meclizine (ANTIVERT) 25 MG tablet TAKE 1 TABLET BY MOUTH 3  TIMES DAILY AS NEEDED FOR  DIZZINESS 180 tablet 1  . omeprazole (PRILOSEC) 40 MG capsule Take 1 capsule (40 mg total) by mouth daily. 90 capsule 3  . promethazine (PHENERGAN) 25 MG tablet Take 1 tablet (25 mg total) by mouth every 8 (eight) hours as needed for nausea or vomiting. 30 tablet  0  . Tiotropium Bromide Monohydrate (SPIRIVA RESPIMAT) 2.5 MCG/ACT AERS Inhale 2 puffs into the lungs every morning. 12 g PRN  . mirabegron ER (MYRBETRIQ) 50 MG TB24 tablet Take 1 tablet (50 mg total) by mouth daily. 90 tablet 1  . oxyCODONE-acetaminophen (PERCOCET/ROXICET) 5-325 MG tablet Take 1 tablet by mouth every 6 (six) hours as needed. for pain 60 tablet 0  . QUEtiapine (SEROQUEL) 100 MG  tablet Take 1.5 tablets (150 mg total) by mouth at bedtime. 135 tablet 3   No facility-administered medications prior to visit.    Allergies  Allergen Reactions  . Amoxicillin-Pot Clavulanate     REACTION: nausea and abdominal pain  . Aspirin     REACTION: upset stomach  . Codeine     REACTION: nausea  . Hctz [Hydrochlorothiazide] Other (See Comments)    Hyponatremia  . Nsaids Nausea Only    ROS Review of Systems    Objective:    Physical Exam  Constitutional: She is oriented to person, place, and time. She appears well-developed and well-nourished.  HENT:  Head: Normocephalic and atraumatic.  Cardiovascular: Normal rate, regular rhythm and normal heart sounds.  Pulmonary/Chest: Effort normal and breath sounds normal.  Neurological: She is alert and oriented to person, place, and time.  Skin: Skin is warm and dry.  Psychiatric: She has a normal mood and affect. Her behavior is normal.    BP 110/62   Pulse 64   Ht 5\' 2"  (1.575 m)   Wt 153 lb (69.4 kg)   SpO2 93%   BMI 27.98 kg/m  Wt Readings from Last 3 Encounters:  07/19/19 153 lb (69.4 kg)  07/16/19 153 lb (69.4 kg)  04/19/19 152 lb (68.9 kg)     There are no preventive care reminders to display for this patient.  There are no preventive care reminders to display for this patient.  Lab Results  Component Value Date   TSH 2.42 09/29/2017   Lab Results  Component Value Date   WBC 7.1 03/21/2019   HGB 12.8 03/21/2019   HCT 37.6 03/21/2019   MCV 96.7 03/21/2019   PLT 206 03/21/2019   Lab Results  Component  Value Date   NA 135 03/21/2019   K 4.7 03/21/2019   CO2 28 03/21/2019   GLUCOSE 99 03/21/2019   BUN 11 03/21/2019   CREATININE 0.82 03/21/2019   BILITOT 0.6 03/21/2019   ALKPHOS 58 12/09/2016   AST 16 03/21/2019   ALT 8 03/21/2019   PROT 6.7 03/21/2019   ALBUMIN 4.5 12/09/2016   CALCIUM 9.6 03/21/2019   Lab Results  Component Value Date   CHOL 177 03/21/2019   Lab Results  Component Value Date   HDL 80 03/21/2019   Lab Results  Component Value Date   LDLCALC 73 03/21/2019   Lab Results  Component Value Date   TRIG 159 (H) 03/21/2019   Lab Results  Component Value Date   CHOLHDL 2.2 03/21/2019   Lab Results  Component Value Date   HGBA1C 5.5 04/19/2019      Assessment & Plan:   Problem List Items Addressed This Visit      Genitourinary   OAB (overactive bladder) - Primary    Having incontinence even on the 50 mg of Myrbetriq.  We will go ahead and refer her to urology for further work-up.  She may have prolapsed bladder etc.  There may be some additional causes.  We did discuss a trial of a second medication which she would like to do while she is waiting to get in with urology.      Relevant Medications   tolterodine (DETROL LA) 2 MG 24 hr capsule   Other Relevant Orders   Ambulatory referral to Urology     Other   Major depressive disorder, single episode    Doing OK overall.  HQ 9 score of 5 today.  Continue fluoxetine 40 mg and continue  Seroquel at bedtime. Discussed the Calm App.  Really encouraged her to use it at night after she gets ready to go to bed to really relax a really would like for her to try the free version for 21 days just to see if she feels like it is helpful in helping her relax to be able to get to bed and go to sleep.      INSOMNIA    Discussed options.  We will try increasing the Seroquel to 200 mg for the next 3 months and see if this is helpful if at any point she is feeling side effects or excess sedation and we can always go  back down.      Encounter for chronic pain management     Indication for chronic opioid:chronic bilateral low back pain Medication and dose:Oxycodone 09/03/2023, 1 tab twice a day as needed. # pills per month:60 Last UDS date:04/19/2019 Opioid Treatment Agreement signed (Y/N):Y Opioid Treatment Agreement last reviewed with patient:Up to date [facility] reviewed this encounter (include red flags):Yes         Relevant Medications   oxyCODONE-acetaminophen (PERCOCET/ROXICET) 5-325 MG tablet   Chronic lower back pain   Relevant Medications   oxyCODONE-acetaminophen (PERCOCET/ROXICET) 5-325 MG tablet    Other Visit Diagnoses    Urinary incontinence, unspecified type       Relevant Medications   tolterodine (DETROL LA) 2 MG 24 hr capsule   Other Relevant Orders   Ambulatory referral to Urology      Meds ordered this encounter  Medications  . QUEtiapine (SEROQUEL) 200 MG tablet    Sig: Take 1 tablet (200 mg total) by mouth at bedtime.    Dispense:  90 tablet    Refill:  1    Requesting 1 year supply  . tolterodine (DETROL LA) 2 MG 24 hr capsule    Sig: Take 1 capsule (2 mg total) by mouth in the morning.    Dispense:  30 capsule    Refill:  1  . oxyCODONE-acetaminophen (PERCOCET/ROXICET) 5-325 MG tablet    Sig: Take 1 tablet by mouth every 8 (eight) hours as needed. for pain    Dispense:  60 tablet    Refill:  0    Follow-up: Return in about 3 months (around 10/19/2019) for chronic pain.    Beatrice Lecher, MD

## 2019-07-19 NOTE — Assessment & Plan Note (Signed)
Indication for chronic opioid:chronic bilateral low back pain Medication and dose:Oxycodone 09/03/2023, 1 tab twice a day as needed. # pills per month:60 Last UDS date:04/19/2019 Opioid Treatment Agreement signed (Y/N):Y Opioid Treatment Agreement last reviewed with patient:Up to date [facility] reviewed this encounter (include red flags):Yes

## 2019-07-19 NOTE — Assessment & Plan Note (Signed)
Discussed options.  We will try increasing the Seroquel to 200 mg for the next 3 months and see if this is helpful if at any point she is feeling side effects or excess sedation and we can always go back down.

## 2019-07-26 DIAGNOSIS — M2012 Hallux valgus (acquired), left foot: Secondary | ICD-10-CM | POA: Diagnosis not present

## 2019-07-26 DIAGNOSIS — M21611 Bunion of right foot: Secondary | ICD-10-CM | POA: Diagnosis not present

## 2019-07-26 DIAGNOSIS — M2011 Hallux valgus (acquired), right foot: Secondary | ICD-10-CM | POA: Diagnosis not present

## 2019-07-26 DIAGNOSIS — M21612 Bunion of left foot: Secondary | ICD-10-CM | POA: Diagnosis not present

## 2019-07-31 DIAGNOSIS — Z1231 Encounter for screening mammogram for malignant neoplasm of breast: Secondary | ICD-10-CM | POA: Diagnosis not present

## 2019-07-31 LAB — HM MAMMOGRAPHY

## 2019-08-01 ENCOUNTER — Encounter: Payer: Self-pay | Admitting: Family Medicine

## 2019-08-07 DIAGNOSIS — R928 Other abnormal and inconclusive findings on diagnostic imaging of breast: Secondary | ICD-10-CM | POA: Diagnosis not present

## 2019-08-07 LAB — HM MAMMOGRAPHY

## 2019-08-08 ENCOUNTER — Ambulatory Visit (INDEPENDENT_AMBULATORY_CARE_PROVIDER_SITE_OTHER): Payer: Medicare Other

## 2019-08-08 ENCOUNTER — Other Ambulatory Visit: Payer: Self-pay

## 2019-08-08 DIAGNOSIS — Z13820 Encounter for screening for osteoporosis: Secondary | ICD-10-CM

## 2019-08-08 DIAGNOSIS — Z780 Asymptomatic menopausal state: Secondary | ICD-10-CM | POA: Diagnosis not present

## 2019-08-08 DIAGNOSIS — M8589 Other specified disorders of bone density and structure, multiple sites: Secondary | ICD-10-CM | POA: Diagnosis not present

## 2019-08-09 DIAGNOSIS — Z171 Estrogen receptor negative status [ER-]: Secondary | ICD-10-CM | POA: Diagnosis not present

## 2019-08-09 DIAGNOSIS — R928 Other abnormal and inconclusive findings on diagnostic imaging of breast: Secondary | ICD-10-CM | POA: Diagnosis not present

## 2019-08-09 DIAGNOSIS — D0511 Intraductal carcinoma in situ of right breast: Secondary | ICD-10-CM | POA: Diagnosis not present

## 2019-08-09 DIAGNOSIS — Z800 Family history of malignant neoplasm of digestive organs: Secondary | ICD-10-CM | POA: Diagnosis not present

## 2019-08-09 DIAGNOSIS — C50511 Malignant neoplasm of lower-outer quadrant of right female breast: Secondary | ICD-10-CM | POA: Diagnosis not present

## 2019-08-09 LAB — HM MAMMOGRAPHY

## 2019-08-10 ENCOUNTER — Encounter: Payer: Self-pay | Admitting: Physician Assistant

## 2019-08-10 DIAGNOSIS — M8580 Other specified disorders of bone density and structure, unspecified site: Secondary | ICD-10-CM | POA: Insufficient documentation

## 2019-08-10 NOTE — Progress Notes (Signed)
[name],   Dr. Jerilynn Mages is out of town. I am [employer]. Bone density shows osteopenia, low bone mass and stay active. Make sure taking vitamin D 1000units(25mg ) and calcium 1200mg  or 4 servings a day. Recheck in 2 years.

## 2019-08-15 DIAGNOSIS — D0511 Intraductal carcinoma in situ of right breast: Secondary | ICD-10-CM | POA: Diagnosis not present

## 2019-08-16 ENCOUNTER — Telehealth: Payer: Self-pay

## 2019-08-16 DIAGNOSIS — D0511 Intraductal carcinoma in situ of right breast: Secondary | ICD-10-CM | POA: Insufficient documentation

## 2019-08-16 HISTORY — DX: Intraductal carcinoma in situ of right breast: D05.11

## 2019-08-16 NOTE — Telephone Encounter (Signed)
Per Dr Madilyn Fireman, ok to order labs and imaging. She did want [place] to reach out to patient to she if she can come in today. Annette Ramirez states she will come by in the morning.

## 2019-08-16 NOTE — Telephone Encounter (Signed)
Dr Jackson Latino office called. Annette Ramirez is scheduled to see Dr Madilyn Fireman tomorrow for a surgical clearance. Dr Jackson Latino wanted to know if Dr Madilyn Fireman would order a CMP, CBC and a chest xray so patient doesn't have to make a special trip to [place]. Diagnosis Ductal carcinoma in situ of right breast. Please advise.   Aldean Jewett, [address]  [place], [place] [address]  [phone]  [phone] (Fax)

## 2019-08-17 ENCOUNTER — Other Ambulatory Visit: Payer: Self-pay

## 2019-08-17 ENCOUNTER — Other Ambulatory Visit: Payer: Self-pay | Admitting: Family Medicine

## 2019-08-17 ENCOUNTER — Ambulatory Visit (INDEPENDENT_AMBULATORY_CARE_PROVIDER_SITE_OTHER): Payer: Medicare Other

## 2019-08-17 ENCOUNTER — Ambulatory Visit (INDEPENDENT_AMBULATORY_CARE_PROVIDER_SITE_OTHER): Payer: Medicare Other | Admitting: Family Medicine

## 2019-08-17 ENCOUNTER — Encounter: Payer: Self-pay | Admitting: Family Medicine

## 2019-08-17 VITALS — BP 109/59 | HR 65 | Ht 62.0 in | Wt 156.0 lb

## 2019-08-17 DIAGNOSIS — Z01818 Encounter for other preprocedural examination: Secondary | ICD-10-CM | POA: Diagnosis not present

## 2019-08-17 DIAGNOSIS — D0511 Intraductal carcinoma in situ of right breast: Secondary | ICD-10-CM

## 2019-08-17 NOTE — Progress Notes (Signed)
Established Patient Office Visit  Subjective:  Patient ID: Annette Ramirez, female    DOB: 09-27-1945  Age: 74 y.o. MRN: [id number]  CC:  Chief Complaint  Patient presents with  . surgical clearance    HPI Annette Ramirez presents for Surgical Clearance for right breast mastectomy status post recent diagnosis of breast cancer.  She is followed by Dr. Jackson Latino.  They are scheduling her for surgery on Tuesday.  She has had a history of multiple surgeries and reports that she is always done well she is never had any complications with anesthesia.  No swallowing difficulties.  She is never had any bleeding issues or complications.  She is able to walk 2 city blocks before having to sit and rest.  She does have some shortness of breath at baseline because of her COPD but denies any recent increase or worsening of symptoms.  She denies any current cough or sputum sputum production.  She denies any fever or other upper respiratory symptoms.  She reports that she had a stress test about 10 years ago she has no known underlying cardiovascular disease.  She does take fish oil but says she actually quit taking it a few days ago.  She does not take any NSAIDs or aspirin..   She has been vaccinated for Covid.  Past Medical History:  Diagnosis Date  . Anxiety   . Asthma   . COPD (chronic obstructive pulmonary disease) ([place])   . Depression   . Ductal carcinoma in situ (DCIS) of right breast 08/16/2019  . Exertional shortness of breath   . Family history of anesthesia complication    "granddaughter has PONV" (02/28/2013)  . Fibromyalgia   . GERD (gastroesophageal reflux disease)   . H/O hiatal hernia   . Hypertension   . Migraines    "in the past; they've stopped" (02/28/2013)  . Opiate use 06/06/2017  . Osteoporosis   . PONV (postoperative nausea and vomiting)   . Sinus headache     Past Surgical History:  Procedure Laterality Date  . ABDOMINAL HYSTERECTOMY  1975  . APPENDECTOMY  1975  .  BREAST CYST ASPIRATION Left 1980's  . COLONOSCOPY W/ BIOPSIES AND POLYPECTOMY  2012  . DILATION AND CURETTAGE OF UTERUS  1960's  . EXCISIONAL HEMORRHOIDECTOMY  1980's  . LUMBAR LAMINECTOMY  02/28/2013  . LUMBAR LAMINECTOMY/DECOMPRESSION MICRODISCECTOMY N/A 02/28/2013   Procedure: LUMBAR LAMINECTOMY/DECOMPRESSION MICRODISCECTOMY/Lumbar 4-5 decompression;  Surgeon: Sinclair Ship, MD;  Location: [facility];  Service: Orthopedics;  Laterality: N/A;  Lumbar 4-5 decompression  . TONSILLECTOMY  1950  . TUBAL LIGATION  1972    Family History  Problem Relation Age of Onset  . Alzheimer's disease Mother   . Prostate cancer Other        grandfather  . Stroke Other        grandparent    Social History   Socioeconomic History  . Marital status: Married    Spouse name: [name]  . Number of children: 2  . Years of education: 23  . Highest education level: Some college, no degree  Occupational History  . Occupation: worked for attorney    Comment: retired  Tobacco Use  . Smoking status: Current Some Day Smoker    Packs/day: 0.25    Years: 40.00    Pack years: 10.00    Types: Cigarettes  . Smokeless tobacco: Never Used  . Tobacco comment: 02/28/2013 "started smoking eletronic cigarettes a couple years ago"  Substance and Sexual Activity  .  Alcohol use: Yes    Alcohol/week: 5.0 standard drinks    Types: 5 Glasses of wine per week    Comment: occasionally social  . Drug use: No  . Sexual activity: Not Currently  Other Topics Concern  . 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   Reads a lot during the day. Enjoys shopping.   Caffeine in the morning- 1 cup f coffee   Social Determinants of Health   Financial Resource Strain:   . Difficulty of Paying Living Expenses:   Food Insecurity:   . Worried About Charity fundraiser in the Last Year:   . Arboriculturist in the Last Year:   Transportation Needs:   . Film/video editor (Medical):   Marland Kitchen Lack of  Transportation (Non-Medical):   Physical Activity:   . Days of Exercise per Week:   . Minutes of Exercise per Session:   Stress:   . Feeling of Stress :   Social Connections:   . Frequency of Communication with Friends and Family:   . Frequency of Social Gatherings with Friends and Family:   . Attends Religious Services:   . Active Member of Clubs or Organizations:   . Attends Archivist Meetings:   Marland Kitchen Marital Status:   Intimate Partner Violence:   . Fear of Current or Ex-Partner:   . Emotionally Abused:   Marland Kitchen Physically Abused:   . Sexually Abused:     Outpatient Medications Prior to Visit  Medication Sig Dispense Refill  . albuterol (PROVENTIL HFA;VENTOLIN HFA) 108 (90 Base) MCG/ACT inhaler Inhale 2 puffs into the lungs every 6 (six) hours as needed for wheezing. 54 g 3  . amitriptyline (ELAVIL) 25 MG tablet TAKE 1 TABLET BY MOUTH AT  BEDTIME 90 tablet 1  . amLODipine (NORVASC) 5 MG tablet TAKE ONE TABLET BY MOUTH EVERY DAY 90 tablet 1  . atenolol (TENORMIN) 100 MG tablet Take 1 tablet (100 mg total) by mouth 2 (two) times daily. 180 tablet 1  . atorvastatin (LIPITOR) 20 MG tablet TAKE 1 TABLET BY MOUTH  DAILY 90 tablet 3  . cholestyramine (QUESTRAN) 4 g packet Take one packet (4 g dose) by mouth at bedtime.    Marland Kitchen FLUoxetine (PROZAC) 40 MG capsule TAKE 1 CAPSULE BY MOUTH  DAILY 90 capsule 3  . gabapentin (NEURONTIN) 100 MG capsule TAKE ONE CAPSULE BY MOUTH EVERY MORNING, ONE CAPSULE AT NOON, THEN THREE CAPSULES AT BEDTIME 450 capsule 3  . loperamide (IMODIUM) 2 MG capsule TAKE ONE CAPSULE BY MOUTH FOUR TIMES A DAY AS NEEDED FOR DIARRHEA.  1  . losartan (COZAAR) 100 MG tablet Take 1 tablet (100 mg total) by mouth daily. 90 tablet 3  . meclizine (ANTIVERT) 25 MG tablet TAKE 1 TABLET BY MOUTH 3  TIMES DAILY AS NEEDED FOR  DIZZINESS 180 tablet 1  . omeprazole (PRILOSEC) 40 MG capsule Take 1 capsule (40 mg total) by mouth daily. 90 capsule 3  . oxyCODONE-acetaminophen  (PERCOCET/ROXICET) 5-325 MG tablet Take 1 tablet by mouth every 8 (eight) hours as needed. for pain 60 tablet 0  . QUEtiapine (SEROQUEL) 200 MG tablet Take 1 tablet (200 mg total) by mouth at bedtime. 90 tablet 1  . Tiotropium Bromide Monohydrate (SPIRIVA RESPIMAT) 2.5 MCG/ACT AERS Inhale 2 puffs into the lungs every morning. 12 g PRN  . tolterodine (DETROL LA) 2 MG 24 hr capsule Take 1 capsule (2 mg total) by mouth in  the morning. 30 capsule 1  . promethazine (PHENERGAN) 25 MG tablet Take 1 tablet (25 mg total) by mouth every 8 (eight) hours as needed for nausea or vomiting. 30 tablet 0   No facility-administered medications prior to visit.    Allergies  Allergen Reactions  . Amoxicillin-Pot Clavulanate     REACTION: nausea and abdominal pain  . Aspirin     REACTION: upset stomach  . Codeine     REACTION: nausea  . Hctz [Hydrochlorothiazide] Other (See Comments)    Hyponatremia  . Nsaids Nausea Only  . Other Nausea And Vomiting    ROS Review of Systems    Objective:    Physical Exam  Constitutional: She is oriented to person, place, and time. She appears well-developed and well-nourished.  HENT:  Head: Normocephalic and atraumatic.  Right Ear: External ear normal.  Left Ear: External ear normal.  Nose: Nose normal.  Mouth/Throat: Oropharynx is clear and moist.  TMs and canals are clear.   Eyes: Pupils are equal, round, and reactive to light. Conjunctivae and EOM are normal.  Neck: No thyromegaly present.  Cardiovascular: Normal rate, regular rhythm and normal heart sounds.  No carotid bruits.  No abdominal bruits.  Pulmonary/Chest: Effort normal and breath sounds normal. She has no wheezes.  Abdominal: Soft. Bowel sounds are normal. She exhibits no distension and no mass. There is no abdominal tenderness. There is no rebound and no guarding.  Musculoskeletal:        General: No edema.     Cervical back: Neck supple.  Lymphadenopathy:    She has no cervical adenopathy.   Neurological: She is alert and oriented to person, place, and time.  Skin: Skin is warm and dry. No pallor.  Psychiatric: She has a normal mood and affect. Her behavior is normal.  Vitals reviewed.   BP (!) 109/59   Pulse 65   Ht 5\' 2"  (1.575 m)   Wt 156 lb (70.8 kg)   SpO2 95%   BMI 28.53 kg/m  Wt Readings from Last 3 Encounters:  08/17/19 156 lb (70.8 kg)  07/19/19 153 lb (69.4 kg)  07/16/19 153 lb (69.4 kg)     There are no preventive care reminders to display for this patient.  There are no preventive care reminders to display for this patient.  Lab Results  Component Value Date   TSH 2.42 09/29/2017   Lab Results  Component Value Date   WBC 6.9 08/17/2019   HGB 11.9 08/17/2019   HCT 34.5 (L) 08/17/2019   MCV 96.9 08/17/2019   PLT 245 08/17/2019   Lab Results  Component Value Date   NA 139 08/17/2019   K 3.9 08/17/2019   CO2 27 08/17/2019   GLUCOSE 122 08/17/2019   BUN 16 08/17/2019   CREATININE 1.21 (H) 08/17/2019   BILITOT 0.7 08/17/2019   ALKPHOS 58 12/09/2016   AST 23 08/17/2019   ALT 14 08/17/2019   PROT 6.3 08/17/2019   ALBUMIN 4.5 12/09/2016   CALCIUM 8.8 08/17/2019   Lab Results  Component Value Date   CHOL 177 03/21/2019   Lab Results  Component Value Date   HDL 80 03/21/2019   Lab Results  Component Value Date   LDLCALC 73 03/21/2019   Lab Results  Component Value Date   TRIG 159 (H) 03/21/2019   Lab Results  Component Value Date   CHOLHDL 2.2 03/21/2019   Lab Results  Component Value Date   HGBA1C 5.5 04/19/2019  Assessment & Plan:   Problem List Items Addressed This Visit    None    Visit Diagnoses    Preop examination    -  Primary     Preoperative testing. She is cleared for surgery.  Patient is overall low cardiovascular risk she does have COPD but has not had a recent flare and is currently stable on her current regimen with no recent/new symptoms.  Please see additional labs as well as chest x-ray.  He  has been instructed to make sure that she stops all NSAIDs, aspirin, fish oil, again seeing etc.  Chest x-ray negative.  Labs are normal.   EKG shows normal sinus rhythm with a rate of 68 bpm she does have incomplete right bundle branch block.  Possible old anterior septal infarct.  EKG unchanged from 2010.  So EKG has been stable for over 10 years at this point.  I think overall she is low risk for significant cardiovascular disease.   No orders of the defined types were placed in this encounter.   Follow-up: Return if symptoms worsen or fail to improve.    Beatrice Lecher, MD

## 2019-08-18 LAB — CBC WITH DIFFERENTIAL/PLATELET
Absolute Monocytes: 966 cells/uL — ABNORMAL HIGH (ref 200–950)
Basophils Absolute: 83 cells/uL (ref 0–200)
Basophils Relative: 1.2 %
Eosinophils Absolute: 207 cells/uL (ref 15–500)
Eosinophils Relative: 3 %
HCT: 34.5 % — ABNORMAL LOW (ref 35.0–45.0)
Hemoglobin: 11.9 g/dL (ref 11.7–15.5)
Lymphs Abs: 1553 cells/uL (ref 850–3900)
MCH: 33.4 pg — ABNORMAL HIGH (ref 27.0–33.0)
MCHC: 34.5 g/dL (ref 32.0–36.0)
MCV: 96.9 fL (ref 80.0–100.0)
MPV: 10.7 fL (ref 7.5–12.5)
Monocytes Relative: 14 %
Neutro Abs: 4092 cells/uL (ref 1500–7800)
Neutrophils Relative %: 59.3 %
Platelets: 245 10*3/uL (ref 140–400)
RBC: 3.56 10*6/uL — ABNORMAL LOW (ref 3.80–5.10)
RDW: 12.2 % (ref 11.0–15.0)
Total Lymphocyte: 22.5 %
WBC: 6.9 10*3/uL (ref 3.8–10.8)

## 2019-08-18 LAB — COMPLETE METABOLIC PANEL WITH GFR
AG Ratio: 2.2 (calc) (ref 1.0–2.5)
ALT: 14 U/L (ref 6–29)
AST: 23 U/L (ref 10–35)
Albumin: 4.3 g/dL (ref 3.6–5.1)
Alkaline phosphatase (APISO): 63 U/L (ref 37–153)
BUN/Creatinine Ratio: 13 (calc) (ref 6–22)
BUN: 16 mg/dL (ref 7–25)
CO2: 27 mmol/L (ref 20–32)
Calcium: 8.8 mg/dL (ref 8.6–10.4)
Chloride: 101 mmol/L (ref 98–110)
Creat: 1.21 mg/dL — ABNORMAL HIGH (ref 0.60–0.93)
GFR, Est African American: 50 mL/min/{1.73_m2} — ABNORMAL LOW (ref >=60)
GFR, Est Non African American: 43 mL/min/{1.73_m2} — ABNORMAL LOW (ref >=60)
Globulin: 2 g/dL (calc) (ref 1.9–3.7)
Glucose, Bld: 122 mg/dL (ref 65–139)
Potassium: 3.9 mmol/L (ref 3.5–5.3)
Sodium: 139 mmol/L (ref 135–146)
Total Bilirubin: 0.7 mg/dL (ref 0.2–1.2)
Total Protein: 6.3 g/dL (ref 6.1–8.1)

## 2019-08-19 ENCOUNTER — Encounter: Payer: Self-pay | Admitting: Family Medicine

## 2019-08-19 DIAGNOSIS — N183 Chronic kidney disease, stage 3 unspecified: Secondary | ICD-10-CM | POA: Insufficient documentation

## 2019-08-20 ENCOUNTER — Encounter: Payer: Self-pay | Admitting: Family Medicine

## 2019-08-20 NOTE — Addendum Note (Signed)
Addended by: Narda Rutherford on: 08/20/2019 01:26 PM   Modules accepted: Orders

## 2019-08-21 DIAGNOSIS — K760 Fatty (change of) liver, not elsewhere classified: Secondary | ICD-10-CM | POA: Diagnosis not present

## 2019-08-21 DIAGNOSIS — Z8371 Family history of colonic polyps: Secondary | ICD-10-CM | POA: Diagnosis not present

## 2019-08-21 DIAGNOSIS — Z880 Allergy status to penicillin: Secondary | ICD-10-CM | POA: Diagnosis not present

## 2019-08-21 DIAGNOSIS — Z886 Allergy status to analgesic agent status: Secondary | ICD-10-CM | POA: Diagnosis not present

## 2019-08-21 DIAGNOSIS — E785 Hyperlipidemia, unspecified: Secondary | ICD-10-CM | POA: Diagnosis not present

## 2019-08-21 DIAGNOSIS — D360 Benign neoplasm of lymph nodes: Secondary | ICD-10-CM | POA: Diagnosis not present

## 2019-08-21 DIAGNOSIS — C50911 Malignant neoplasm of unspecified site of right female breast: Secondary | ICD-10-CM | POA: Diagnosis not present

## 2019-08-21 DIAGNOSIS — K219 Gastro-esophageal reflux disease without esophagitis: Secondary | ICD-10-CM | POA: Diagnosis not present

## 2019-08-21 DIAGNOSIS — Z800 Family history of malignant neoplasm of digestive organs: Secondary | ICD-10-CM | POA: Diagnosis not present

## 2019-08-21 DIAGNOSIS — Z79899 Other long term (current) drug therapy: Secondary | ICD-10-CM | POA: Diagnosis not present

## 2019-08-21 DIAGNOSIS — D0511 Intraductal carcinoma in situ of right breast: Secondary | ICD-10-CM | POA: Diagnosis not present

## 2019-08-21 DIAGNOSIS — Z86010 Personal history of colonic polyps: Secondary | ICD-10-CM | POA: Diagnosis not present

## 2019-08-21 DIAGNOSIS — J449 Chronic obstructive pulmonary disease, unspecified: Secondary | ICD-10-CM | POA: Diagnosis not present

## 2019-08-21 DIAGNOSIS — D0591 Unspecified type of carcinoma in situ of right breast: Secondary | ICD-10-CM | POA: Diagnosis not present

## 2019-08-21 DIAGNOSIS — Z885 Allergy status to narcotic agent status: Secondary | ICD-10-CM | POA: Diagnosis not present

## 2019-08-21 DIAGNOSIS — M797 Fibromyalgia: Secondary | ICD-10-CM | POA: Diagnosis not present

## 2019-08-21 DIAGNOSIS — I10 Essential (primary) hypertension: Secondary | ICD-10-CM | POA: Diagnosis not present

## 2019-08-21 DIAGNOSIS — I2510 Atherosclerotic heart disease of native coronary artery without angina pectoris: Secondary | ICD-10-CM | POA: Diagnosis not present

## 2019-08-21 HISTORY — PX: MASTECTOMY: SHX3

## 2019-08-22 DIAGNOSIS — J449 Chronic obstructive pulmonary disease, unspecified: Secondary | ICD-10-CM | POA: Diagnosis not present

## 2019-08-22 DIAGNOSIS — Z79899 Other long term (current) drug therapy: Secondary | ICD-10-CM | POA: Diagnosis not present

## 2019-08-22 DIAGNOSIS — Z880 Allergy status to penicillin: Secondary | ICD-10-CM | POA: Diagnosis not present

## 2019-08-22 DIAGNOSIS — Z86010 Personal history of colonic polyps: Secondary | ICD-10-CM | POA: Diagnosis not present

## 2019-08-22 DIAGNOSIS — I10 Essential (primary) hypertension: Secondary | ICD-10-CM | POA: Diagnosis not present

## 2019-08-22 DIAGNOSIS — D0511 Intraductal carcinoma in situ of right breast: Secondary | ICD-10-CM | POA: Diagnosis not present

## 2019-08-22 DIAGNOSIS — K219 Gastro-esophageal reflux disease without esophagitis: Secondary | ICD-10-CM | POA: Diagnosis not present

## 2019-08-22 DIAGNOSIS — K760 Fatty (change of) liver, not elsewhere classified: Secondary | ICD-10-CM | POA: Diagnosis not present

## 2019-08-22 DIAGNOSIS — Z8371 Family history of colonic polyps: Secondary | ICD-10-CM | POA: Diagnosis not present

## 2019-08-22 DIAGNOSIS — Z885 Allergy status to narcotic agent status: Secondary | ICD-10-CM | POA: Diagnosis not present

## 2019-08-22 DIAGNOSIS — C50911 Malignant neoplasm of unspecified site of right female breast: Secondary | ICD-10-CM | POA: Diagnosis not present

## 2019-08-22 DIAGNOSIS — Z800 Family history of malignant neoplasm of digestive organs: Secondary | ICD-10-CM | POA: Diagnosis not present

## 2019-08-22 DIAGNOSIS — Z886 Allergy status to analgesic agent status: Secondary | ICD-10-CM | POA: Diagnosis not present

## 2019-08-22 DIAGNOSIS — M797 Fibromyalgia: Secondary | ICD-10-CM | POA: Diagnosis not present

## 2019-08-23 ENCOUNTER — Emergency Department (INDEPENDENT_AMBULATORY_CARE_PROVIDER_SITE_OTHER): Admission: EM | Admit: 2019-08-23 | Discharge: 2019-08-23 | Payer: Medicare Other | Source: Home / Self Care

## 2019-08-23 ENCOUNTER — Encounter: Payer: Self-pay | Admitting: Emergency Medicine

## 2019-08-23 ENCOUNTER — Other Ambulatory Visit: Payer: Self-pay

## 2019-08-23 ENCOUNTER — Telehealth: Payer: Self-pay

## 2019-08-23 DIAGNOSIS — G4489 Other headache syndrome: Secondary | ICD-10-CM | POA: Diagnosis not present

## 2019-08-23 DIAGNOSIS — Z9011 Acquired absence of right breast and nipple: Secondary | ICD-10-CM | POA: Diagnosis not present

## 2019-08-23 DIAGNOSIS — Z79899 Other long term (current) drug therapy: Secondary | ICD-10-CM | POA: Diagnosis not present

## 2019-08-23 DIAGNOSIS — Z20822 Contact with and (suspected) exposure to covid-19: Secondary | ICD-10-CM | POA: Diagnosis not present

## 2019-08-23 DIAGNOSIS — R0602 Shortness of breath: Secondary | ICD-10-CM | POA: Diagnosis not present

## 2019-08-23 DIAGNOSIS — G629 Polyneuropathy, unspecified: Secondary | ICD-10-CM | POA: Diagnosis not present

## 2019-08-23 DIAGNOSIS — F17210 Nicotine dependence, cigarettes, uncomplicated: Secondary | ICD-10-CM | POA: Diagnosis not present

## 2019-08-23 DIAGNOSIS — J439 Emphysema, unspecified: Secondary | ICD-10-CM | POA: Diagnosis not present

## 2019-08-23 DIAGNOSIS — K219 Gastro-esophageal reflux disease without esophagitis: Secondary | ICD-10-CM | POA: Diagnosis not present

## 2019-08-23 DIAGNOSIS — R0789 Other chest pain: Secondary | ICD-10-CM | POA: Diagnosis not present

## 2019-08-23 DIAGNOSIS — J9811 Atelectasis: Secondary | ICD-10-CM | POA: Diagnosis not present

## 2019-08-23 DIAGNOSIS — Z7951 Long term (current) use of inhaled steroids: Secondary | ICD-10-CM | POA: Diagnosis not present

## 2019-08-23 DIAGNOSIS — Z885 Allergy status to narcotic agent status: Secondary | ICD-10-CM | POA: Diagnosis not present

## 2019-08-23 DIAGNOSIS — Z888 Allergy status to other drugs, medicaments and biological substances status: Secondary | ICD-10-CM | POA: Diagnosis not present

## 2019-08-23 DIAGNOSIS — R079 Chest pain, unspecified: Secondary | ICD-10-CM | POA: Diagnosis not present

## 2019-08-23 DIAGNOSIS — R112 Nausea with vomiting, unspecified: Secondary | ICD-10-CM

## 2019-08-23 DIAGNOSIS — I10 Essential (primary) hypertension: Secondary | ICD-10-CM | POA: Diagnosis not present

## 2019-08-23 DIAGNOSIS — Z886 Allergy status to analgesic agent status: Secondary | ICD-10-CM | POA: Diagnosis not present

## 2019-08-23 DIAGNOSIS — J9601 Acute respiratory failure with hypoxia: Secondary | ICD-10-CM | POA: Diagnosis not present

## 2019-08-23 DIAGNOSIS — R0902 Hypoxemia: Secondary | ICD-10-CM | POA: Diagnosis not present

## 2019-08-23 DIAGNOSIS — D0581 Other specified type of carcinoma in situ of right breast: Secondary | ICD-10-CM | POA: Diagnosis not present

## 2019-08-23 DIAGNOSIS — R519 Headache, unspecified: Secondary | ICD-10-CM

## 2019-08-23 DIAGNOSIS — M797 Fibromyalgia: Secondary | ICD-10-CM | POA: Diagnosis not present

## 2019-08-23 DIAGNOSIS — J441 Chronic obstructive pulmonary disease with (acute) exacerbation: Secondary | ICD-10-CM | POA: Diagnosis not present

## 2019-08-23 DIAGNOSIS — R9431 Abnormal electrocardiogram [ECG] [EKG]: Secondary | ICD-10-CM | POA: Diagnosis not present

## 2019-08-23 DIAGNOSIS — G4700 Insomnia, unspecified: Secondary | ICD-10-CM | POA: Diagnosis not present

## 2019-08-23 DIAGNOSIS — D649 Anemia, unspecified: Secondary | ICD-10-CM | POA: Diagnosis not present

## 2019-08-23 MED ORDER — AMLODIPINE BESYLATE 5 MG PO TABS
5.00 | ORAL_TABLET | ORAL | Status: DC
Start: 2019-08-23 — End: 2019-08-23

## 2019-08-23 MED ORDER — GABAPENTIN 100 MG PO CAPS
300.00 | ORAL_CAPSULE | ORAL | Status: DC
Start: 2019-08-23 — End: 2019-08-23

## 2019-08-23 MED ORDER — UMECLIDINIUM BROMIDE 62.5 MCG/INH IN AEPB
1.00 | INHALATION_SPRAY | RESPIRATORY_TRACT | Status: DC
Start: 2019-08-23 — End: 2019-08-23

## 2019-08-23 MED ORDER — GENERIC EXTERNAL MEDICATION
Status: DC
Start: ? — End: 2019-08-23

## 2019-08-23 MED ORDER — TEMAZEPAM 7.5 MG PO CAPS
7.50 | ORAL_CAPSULE | ORAL | Status: DC
Start: ? — End: 2019-08-23

## 2019-08-23 MED ORDER — ATORVASTATIN CALCIUM 20 MG PO TABS
20.00 | ORAL_TABLET | ORAL | Status: DC
Start: 2019-08-24 — End: 2019-08-23

## 2019-08-23 MED ORDER — FLUOXETINE HCL 10 MG PO CAPS
40.00 | ORAL_CAPSULE | ORAL | Status: DC
Start: 2019-08-23 — End: 2019-08-23

## 2019-08-23 MED ORDER — PANTOPRAZOLE SODIUM 40 MG PO TBEC
40.00 | DELAYED_RELEASE_TABLET | ORAL | Status: DC
Start: 2019-08-23 — End: 2019-08-23

## 2019-08-23 MED ORDER — LOSARTAN POTASSIUM 50 MG PO TABS
100.00 | ORAL_TABLET | ORAL | Status: DC
Start: 2019-08-23 — End: 2019-08-23

## 2019-08-23 MED ORDER — GABAPENTIN 100 MG PO CAPS
100.00 | ORAL_CAPSULE | ORAL | Status: DC
Start: 2019-08-24 — End: 2019-08-23

## 2019-08-23 MED ORDER — ALBUTEROL SULFATE (2.5 MG/3ML) 0.083% IN NEBU
2.50 | INHALATION_SOLUTION | RESPIRATORY_TRACT | Status: DC
Start: ? — End: 2019-08-23

## 2019-08-23 MED ORDER — OXYCODONE HCL 5 MG PO TABS
5.00 | ORAL_TABLET | ORAL | Status: DC
Start: ? — End: 2019-08-23

## 2019-08-23 MED ORDER — POLYETHYLENE GLYCOL 3350 17 GM/SCOOP PO POWD
17.00 | ORAL | Status: DC
Start: ? — End: 2019-08-23

## 2019-08-23 MED ORDER — PREDNISONE 20 MG PO TABS
60.00 | ORAL_TABLET | ORAL | Status: DC
Start: 2019-08-23 — End: 2019-08-23

## 2019-08-23 MED ORDER — IPRATROPIUM-ALBUTEROL 0.5-2.5 (3) MG/3ML IN SOLN
3.00 | RESPIRATORY_TRACT | Status: DC
Start: 2019-08-23 — End: 2019-08-23

## 2019-08-23 MED ORDER — IPRATROPIUM-ALBUTEROL 0.5-2.5 (3) MG/3ML IN SOLN
3.00 | RESPIRATORY_TRACT | Status: DC
Start: ? — End: 2019-08-23

## 2019-08-23 MED ORDER — OXYCODONE HCL 5 MG PO TABS
10.00 | ORAL_TABLET | ORAL | Status: DC
Start: ? — End: 2019-08-23

## 2019-08-23 MED ORDER — PROCHLORPERAZINE EDISYLATE 10 MG/2ML IJ SOLN
5.00 | INTRAMUSCULAR | Status: DC
Start: ? — End: 2019-08-23

## 2019-08-23 MED ORDER — QUETIAPINE FUMARATE 25 MG PO TABS
100.00 | ORAL_TABLET | ORAL | Status: DC
Start: 2019-08-23 — End: 2019-08-23

## 2019-08-23 MED ORDER — OXYBUTYNIN CHLORIDE ER 5 MG PO TB24
5.00 | ORAL_TABLET | ORAL | Status: DC
Start: 2019-08-24 — End: 2019-08-23

## 2019-08-23 MED ORDER — ATENOLOL 25 MG PO TABS
100.00 | ORAL_TABLET | ORAL | Status: DC
Start: 2019-08-23 — End: 2019-08-23

## 2019-08-23 NOTE — ED Triage Notes (Signed)
Rt breast removed Tuesday, headache and nausea started yesterday. Called surgeon she said she did nit think it was related to surgery.

## 2019-08-23 NOTE — Discharge Instructions (Signed)
  You have declined EMS transport, however, due to your dangerously low oxygen level, severe headache, nausea and vomiting, it is advised you have your husband drive you to [facility] Emergency Department immediately.  There is concern you may have a blood clot causing your symptoms, which could cause a stroke, heart attack or even death.   If for some reason your husband has to stop along the way, do not hesitate to have him call 911.

## 2019-08-23 NOTE — ED Provider Notes (Signed)
[facility]    CSN: [id number] Arrival date & time: 08/23/19  [id number]      History   Chief Complaint Chief Complaint  Patient presents with  . Nausea    HPI Annette Ramirez is a 74 y.o. female.   HPI Annette Ramirez is a 74 y.o. female presenting to UC with c/o severe frontal headache, nausea and vomiting that started yesterday after being discharged home following a mastectomy of Right breast at [facility].  She has tried oxycodone and zofran for her symptoms w/o relief.  She has not been able to eat anything for about 24 hours and is dry heaving.  Denies fever or chills.  She does feel mildly SOB.  Hx of COPD.  Pt states surgical site pain is sore but states it is the severity she would expect following surgery. Denies change in vision, dizziness, congestion, sore throat or ear pain.    Past Medical History:  Diagnosis Date  . Anxiety   . Asthma   . COPD (chronic obstructive pulmonary disease) ([place])   . Depression   . Ductal carcinoma in situ (DCIS) of right breast 08/16/2019  . Exertional shortness of breath   . Family history of anesthesia complication    "granddaughter has PONV" (02/28/2013)  . Fibromyalgia   . GERD (gastroesophageal reflux disease)   . H/O hiatal hernia   . Hypertension   . Migraines    "in the past; they've stopped" (02/28/2013)  . Opiate use 06/06/2017  . Osteoporosis   . PONV (postoperative nausea and vomiting)   . Sinus headache     Patient Active Problem List   Diagnosis Date Noted  . CKD (chronic kidney disease) stage 3, GFR 30-59 ml/min 08/19/2019  . Ductal carcinoma in situ (DCIS) of right breast 08/16/2019  . Osteopenia 08/10/2019  . Elevated triglycerides with high cholesterol 06/07/2019  . OAB (overactive bladder) 04/19/2019  . Opiate use 06/06/2017  . Idiopathic peripheral neuropathy 01/06/2017  . Hyponatremia 10/21/2016  . Facet arthritis of lumbar region 07/13/2016  . Encounter for chronic pain  management 04/05/2016  . Chronic lower back pain 03/15/2016  . History of adenomatous polyp of colon 10/03/2015  . Risk for coronary artery disease between 10% and 20% in next 10 years 05/30/2015  . Arteriosclerosis of aorta ([place]) 03/17/2015  . Thoracic neuritis 07/25/2012  . COPD (chronic obstructive pulmonary disease) ([place]) 07/02/2010  . Depression 02/13/2009  . Major depressive disorder, single episode 02/13/2009  . HEARING LOSS 09/25/2008  . GERD 10/10/2007  . INSOMNIA 03/21/2007  . ANXIETY DISORDER, GENERALIZED 03/10/2006  . TOBACCO ABUSE 03/10/2006  . HYPERTENSION, BENIGN ESSENTIAL 03/10/2006  . Fibromyalgia 03/10/2006  . Osteoporosis 03/10/2006  . Polymyositis ([place]) 03/10/2006    Past Surgical History:  Procedure Laterality Date  . ABDOMINAL HYSTERECTOMY  1975  . APPENDECTOMY  1975  . BREAST CYST ASPIRATION Left 1980's  . COLONOSCOPY W/ BIOPSIES AND POLYPECTOMY  2012  . DILATION AND CURETTAGE OF UTERUS  1960's  . EXCISIONAL HEMORRHOIDECTOMY  1980's  . LUMBAR LAMINECTOMY  02/28/2013  . LUMBAR LAMINECTOMY/DECOMPRESSION MICRODISCECTOMY N/A 02/28/2013   Procedure: LUMBAR LAMINECTOMY/DECOMPRESSION MICRODISCECTOMY/Lumbar 4-5 decompression;  Surgeon: Sinclair Ship, MD;  Location: [place];  Service: Orthopedics;  Laterality: N/A;  Lumbar 4-5 decompression  . TONSILLECTOMY  1950  . TUBAL LIGATION  1972    OB History   No obstetric history on file.      Home Medications    Prior to Admission medications  Medication Sig Start Date End Date Taking? Authorizing Provider  albuterol (PROVENTIL HFA;VENTOLIN HFA) 108 (90 Base) MCG/ACT inhaler Inhale 2 puffs into the lungs every 6 (six) hours as needed for wheezing. 08/25/16   Hali Marry, MD  amitriptyline (ELAVIL) 25 MG tablet TAKE 1 TABLET BY MOUTH AT  BEDTIME 08/17/19   Hali Marry, MD  amLODipine (NORVASC) 5 MG tablet TAKE ONE TABLET BY MOUTH EVERY DAY 10/20/18   Hali Marry, MD  atenolol  (TENORMIN) 100 MG tablet Take 1 tablet (100 mg total) by mouth 2 (two) times daily. 12/11/18   Hali Marry, MD  atorvastatin (LIPITOR) 20 MG tablet TAKE 1 TABLET BY MOUTH  DAILY 06/04/19   Hali Marry, MD  cholestyramine Lucrezia Starch) 4 g packet Take one packet (4 g dose) by mouth at bedtime. 05/23/19   [provider]  FLUoxetine (PROZAC) 40 MG capsule TAKE 1 CAPSULE BY MOUTH  DAILY 06/11/19   Hali Marry, MD  gabapentin (NEURONTIN) 100 MG capsule TAKE ONE CAPSULE BY MOUTH EVERY MORNING, ONE CAPSULE AT NOON, THEN THREE CAPSULES AT BEDTIME 10/02/18   Hali Marry, MD  loperamide (IMODIUM) 2 MG capsule TAKE ONE CAPSULE BY MOUTH FOUR TIMES A DAY AS NEEDED FOR DIARRHEA. 06/03/17   [provider]  losartan (COZAAR) 100 MG tablet Take 1 tablet (100 mg total) by mouth daily. 10/25/18   Hali Marry, MD  meclizine (ANTIVERT) 25 MG tablet TAKE 1 TABLET BY MOUTH 3  TIMES DAILY AS NEEDED FOR  DIZZINESS 04/17/19   Hali Marry, MD  omeprazole (PRILOSEC) 40 MG capsule Take 1 capsule (40 mg total) by mouth daily. 09/18/18   Hali Marry, MD  oxyCODONE-acetaminophen (PERCOCET/ROXICET) 5-325 MG tablet Take 1 tablet by mouth every 8 (eight) hours as needed. for pain 07/19/19   Hali Marry, MD  QUEtiapine (SEROQUEL) 200 MG tablet Take 1 tablet (200 mg total) by mouth at bedtime. 07/19/19   Hali Marry, MD  Tiotropium Bromide Monohydrate (SPIRIVA RESPIMAT) 2.5 MCG/ACT AERS Inhale 2 puffs into the lungs every morning. 08/22/18   Hali Marry, MD  tolterodine (DETROL LA) 2 MG 24 hr capsule Take 1 capsule (2 mg total) by mouth in the morning. 07/19/19   Hali Marry, MD    Family History Family History  Problem Relation Age of Onset  . Alzheimer's disease Mother   . Prostate cancer Other        grandfather  . Stroke Other        grandparent    Social History Social History   Tobacco Use  . Smoking status:  Current Some Day Smoker    Packs/day: 0.25    Years: 40.00    Pack years: 10.00    Types: Cigarettes  . Smokeless tobacco: Never Used  . Tobacco comment: 02/28/2013 "started smoking eletronic cigarettes a couple years ago"  Substance Use Topics  . Alcohol use: Yes    Alcohol/week: 5.0 standard drinks    Types: 5 Glasses of wine per week    Comment: occasionally social  . Drug use: No     Allergies   Amoxicillin-pot clavulanate, Aspirin, Codeine, Hctz [hydrochlorothiazide], Nsaids, and Other   Review of Systems Review of Systems  Constitutional: Negative for chills and fever.  Respiratory: Positive for chest tightness and shortness of breath.   Cardiovascular: Positive for chest pain (right side, post-mastectomy pain).  Gastrointestinal: Positive for nausea and vomiting. Negative for abdominal pain and diarrhea.  Neurological: Positive for headaches. Negative for dizziness and light-headedness.     Physical Exam Triage Vital Signs ED Triage Vitals  Enc Vitals Group     BP 08/23/19 0930 (!) 162/86     Pulse Rate 08/23/19 0930 88     Resp --      Temp 08/23/19 0930 98.5 F (36.9 C)     Temp Source 08/23/19 0930 Oral     SpO2 08/23/19 0930 (!) 84 %     Weight 08/23/19 0932 155 lb (70.3 kg)     Height 08/23/19 0932 5\' 1"  (1.549 m)     Head Circumference --      Peak Flow --      Pain Score 08/23/19 0932 10     Pain Loc --      Pain Edu? --      Excl. in [place]? --    No data found.  Updated Vital Signs BP (!) 162/86 (BP Location: Left Arm)   Pulse 88   Temp 98.5 F (36.9 C) (Oral)   Ht 5\' 1"  (1.549 m)   Wt 155 lb (70.3 kg)   SpO2 (!) 84%   BMI 29.29 kg/m   Visual Acuity Right Eye Distance:   Left Eye Distance:   Bilateral Distance:    Right Eye Near:   Left Eye Near:    Bilateral Near:     Physical Exam Vitals and nursing note reviewed.  Constitutional:      Appearance: She is well-developed.     Comments: Pt sitting in exam chair, appears  uncomfortable, essential tremor noted. Alert and cooperative during exam.   HENT:     Head: Normocephalic and atraumatic.     Mouth/Throat:     Mouth: Mucous membranes are dry.  Cardiovascular:     Rate and Rhythm: Normal rate and regular rhythm.  Pulmonary:     Effort: Pulmonary effort is normal.     Breath sounds: Examination of the right-lower field reveals decreased breath sounds. Examination of the left-lower field reveals decreased breath sounds. Decreased breath sounds present. No wheezing or rhonchi.     Comments: Short of breat between sentences. Musculoskeletal:        General: Normal range of motion.     Cervical back: Normal range of motion.  Skin:    General: Skin is warm and dry.  Neurological:     Mental Status: She is alert and oriented to person, place, and time.  Psychiatric:        Behavior: Behavior normal.      UC Treatments / Results  Labs (all labs ordered are listed, but only abnormal results are displayed) Labs Reviewed - No data to display  EKG   Radiology No results found.  Procedures Procedures (including critical care time)  Medications Ordered in UC Medications - No data to display  Initial Impression / Assessment and Plan / UC Course  I have reviewed the triage vital signs and the nursing notes.  Pertinent labs & imaging results that were available during my care of the patient were reviewed by me and considered in my medical decision making (see chart for details).     O2 Sat 84% on RA  Was only able to get up to 91% after pt took a few deep breaths Due to recent discharge following mastectomy with c/o severe headache and no improvement with prescribed medications, recommend further evaluation and treatment at [facility]. Pt understanding and agreeable to have husband drive  her She has declined EMS transport  Pt discharged in stable condition.   Final Clinical Impressions(s) / UC Diagnoses   Final diagnoses:    Hypoxia  Bad headache  Nausea and vomiting in adult     Discharge Instructions      You have declined EMS transport, however, due to your dangerously low oxygen level, severe headache, nausea and vomiting, it is advised you have your husband drive you to [facility] Emergency Department immediately.  There is concern you may have a blood clot causing your symptoms, which could cause a stroke, heart attack or even death.   If for some reason your husband has to stop along the way, do not hesitate to have him call 911.     ED Prescriptions    None     PDMP not reviewed this encounter.   Noe Gens, PA-C 08/23/19 1210

## 2019-08-23 NOTE — Telephone Encounter (Signed)
Rasa called and states she doesn't feel well. She reported having a headache and a fever. She is post surgery. She states she was advised my the surgeon's office to call PCP. I offered a virtual visit. She states she is going to go to the urgent care. See note from surgeon's office below.   Telephone Encounter - Waynetta Pean, RN - 08/23/2019 7:52 AM EDT Patient reports severe headache that started before leaving the hospital on 08/22/19. It has worsened since 8 pm. States not enough relief from oxycodone to sleep. Other emergent symptoms ruled out. Patient states she does not want to go to ED. Report to Dr. Otelia Santee. Order that patient can go to ED or follow up with PCP. Dr. Jackson Latino states plan to contact patient's PCP office regarding this.  Caller informed and verbalized understanding.

## 2019-08-24 DIAGNOSIS — J9601 Acute respiratory failure with hypoxia: Secondary | ICD-10-CM | POA: Diagnosis not present

## 2019-08-25 DIAGNOSIS — J9601 Acute respiratory failure with hypoxia: Secondary | ICD-10-CM | POA: Diagnosis not present

## 2019-08-25 DIAGNOSIS — J449 Chronic obstructive pulmonary disease, unspecified: Secondary | ICD-10-CM | POA: Diagnosis not present

## 2019-08-28 ENCOUNTER — Telehealth: Payer: Self-pay

## 2019-08-28 NOTE — Telephone Encounter (Signed)
I called and left a message asking Annette Ramirez to call back to schedule a follow up hospital visit.

## 2019-08-29 NOTE — Telephone Encounter (Signed)
Patient scheduled.

## 2019-09-05 ENCOUNTER — Other Ambulatory Visit: Payer: Self-pay

## 2019-09-05 DIAGNOSIS — Z9011 Acquired absence of right breast and nipple: Secondary | ICD-10-CM | POA: Diagnosis not present

## 2019-09-05 DIAGNOSIS — M545 Low back pain, unspecified: Secondary | ICD-10-CM

## 2019-09-05 DIAGNOSIS — M5414 Radiculopathy, thoracic region: Secondary | ICD-10-CM | POA: Diagnosis not present

## 2019-09-05 DIAGNOSIS — Z79899 Other long term (current) drug therapy: Secondary | ICD-10-CM | POA: Diagnosis not present

## 2019-09-05 DIAGNOSIS — D649 Anemia, unspecified: Secondary | ICD-10-CM | POA: Diagnosis not present

## 2019-09-05 DIAGNOSIS — G4489 Other headache syndrome: Secondary | ICD-10-CM | POA: Diagnosis not present

## 2019-09-05 DIAGNOSIS — Z800 Family history of malignant neoplasm of digestive organs: Secondary | ICD-10-CM | POA: Diagnosis not present

## 2019-09-05 DIAGNOSIS — Z87891 Personal history of nicotine dependence: Secondary | ICD-10-CM | POA: Diagnosis not present

## 2019-09-05 DIAGNOSIS — Z08 Encounter for follow-up examination after completed treatment for malignant neoplasm: Secondary | ICD-10-CM | POA: Diagnosis not present

## 2019-09-05 DIAGNOSIS — J449 Chronic obstructive pulmonary disease, unspecified: Secondary | ICD-10-CM | POA: Diagnosis not present

## 2019-09-05 DIAGNOSIS — Z853 Personal history of malignant neoplasm of breast: Secondary | ICD-10-CM | POA: Diagnosis not present

## 2019-09-05 DIAGNOSIS — I10 Essential (primary) hypertension: Secondary | ICD-10-CM | POA: Diagnosis not present

## 2019-09-05 DIAGNOSIS — G4709 Other insomnia: Secondary | ICD-10-CM | POA: Diagnosis not present

## 2019-09-05 DIAGNOSIS — R55 Syncope and collapse: Secondary | ICD-10-CM | POA: Diagnosis not present

## 2019-09-05 DIAGNOSIS — J9601 Acute respiratory failure with hypoxia: Secondary | ICD-10-CM | POA: Diagnosis not present

## 2019-09-05 DIAGNOSIS — G8929 Other chronic pain: Secondary | ICD-10-CM

## 2019-09-05 DIAGNOSIS — F17200 Nicotine dependence, unspecified, uncomplicated: Secondary | ICD-10-CM | POA: Diagnosis not present

## 2019-09-05 DIAGNOSIS — M810 Age-related osteoporosis without current pathological fracture: Secondary | ICD-10-CM | POA: Diagnosis not present

## 2019-09-05 NOTE — Telephone Encounter (Signed)
Patient last seen 08/17/19 for surgical clearance  Patient had surgery and did confirm with me that she received #10 or #12 (she cant remember) of Percocet 5-325 from her surgeon and she was taking 2 QD  Last RX for Percocet 5-325 sent by Dr Madilyn Fireman was on 07/19/19 for #60, 1 Q8H (patient states she usually only takes 1 QD)  Was scheduled for hospital follow up for COPD exacerbation tomorrow, but appt was cancelled.   RX pended, please advise

## 2019-09-06 ENCOUNTER — Other Ambulatory Visit: Payer: Self-pay | Admitting: Family Medicine

## 2019-09-06 ENCOUNTER — Inpatient Hospital Stay: Payer: Medicare Other | Admitting: Family Medicine

## 2019-09-06 DIAGNOSIS — N3281 Overactive bladder: Secondary | ICD-10-CM

## 2019-09-06 MED ORDER — OXYCODONE-ACETAMINOPHEN 5-325 MG PO TABS: 1.0000 | ORAL_TABLET | Freq: Three times a day (TID) | ORAL | 0 refills | Status: DC | PRN

## 2019-09-07 DIAGNOSIS — G479 Sleep disorder, unspecified: Secondary | ICD-10-CM | POA: Diagnosis not present

## 2019-09-07 DIAGNOSIS — C50911 Malignant neoplasm of unspecified site of right female breast: Secondary | ICD-10-CM | POA: Diagnosis not present

## 2019-09-07 DIAGNOSIS — R0902 Hypoxemia: Secondary | ICD-10-CM | POA: Diagnosis not present

## 2019-09-07 DIAGNOSIS — Z87891 Personal history of nicotine dependence: Secondary | ICD-10-CM | POA: Diagnosis not present

## 2019-09-07 DIAGNOSIS — J449 Chronic obstructive pulmonary disease, unspecified: Secondary | ICD-10-CM | POA: Diagnosis not present

## 2019-09-07 DIAGNOSIS — Z122 Encounter for screening for malignant neoplasm of respiratory organs: Secondary | ICD-10-CM | POA: Diagnosis not present

## 2019-09-11 DIAGNOSIS — R0902 Hypoxemia: Secondary | ICD-10-CM | POA: Diagnosis not present

## 2019-09-11 DIAGNOSIS — R0681 Apnea, not elsewhere classified: Secondary | ICD-10-CM | POA: Diagnosis not present

## 2019-09-11 DIAGNOSIS — R0683 Snoring: Secondary | ICD-10-CM | POA: Diagnosis not present

## 2019-09-18 DIAGNOSIS — R0902 Hypoxemia: Secondary | ICD-10-CM | POA: Diagnosis not present

## 2019-09-18 DIAGNOSIS — R0683 Snoring: Secondary | ICD-10-CM | POA: Diagnosis not present

## 2019-09-18 DIAGNOSIS — R0681 Apnea, not elsewhere classified: Secondary | ICD-10-CM | POA: Diagnosis not present

## 2019-09-24 DIAGNOSIS — J449 Chronic obstructive pulmonary disease, unspecified: Secondary | ICD-10-CM | POA: Diagnosis not present

## 2019-10-02 DIAGNOSIS — R0902 Hypoxemia: Secondary | ICD-10-CM | POA: Diagnosis not present

## 2019-10-03 ENCOUNTER — Encounter: Payer: Self-pay | Admitting: Family Medicine

## 2019-10-03 DIAGNOSIS — M810 Age-related osteoporosis without current pathological fracture: Secondary | ICD-10-CM | POA: Diagnosis not present

## 2019-10-03 DIAGNOSIS — D7589 Other specified diseases of blood and blood-forming organs: Secondary | ICD-10-CM | POA: Diagnosis not present

## 2019-10-03 DIAGNOSIS — J438 Other emphysema: Secondary | ICD-10-CM | POA: Diagnosis not present

## 2019-10-03 DIAGNOSIS — D0511 Intraductal carcinoma in situ of right breast: Secondary | ICD-10-CM | POA: Diagnosis not present

## 2019-10-03 DIAGNOSIS — R0902 Hypoxemia: Secondary | ICD-10-CM | POA: Insufficient documentation

## 2019-10-04 DIAGNOSIS — D0511 Intraductal carcinoma in situ of right breast: Secondary | ICD-10-CM | POA: Diagnosis not present

## 2019-10-04 DIAGNOSIS — M6281 Muscle weakness (generalized): Secondary | ICD-10-CM | POA: Diagnosis not present

## 2019-10-04 DIAGNOSIS — M79621 Pain in right upper arm: Secondary | ICD-10-CM | POA: Diagnosis not present

## 2019-10-04 DIAGNOSIS — M25611 Stiffness of right shoulder, not elsewhere classified: Secondary | ICD-10-CM | POA: Diagnosis not present

## 2019-10-04 DIAGNOSIS — R293 Abnormal posture: Secondary | ICD-10-CM | POA: Diagnosis not present

## 2019-10-08 ENCOUNTER — Other Ambulatory Visit: Payer: Self-pay | Admitting: Family Medicine

## 2019-10-08 DIAGNOSIS — K219 Gastro-esophageal reflux disease without esophagitis: Secondary | ICD-10-CM

## 2019-10-19 ENCOUNTER — Other Ambulatory Visit: Payer: Self-pay

## 2019-10-19 ENCOUNTER — Other Ambulatory Visit: Payer: Self-pay | Admitting: Family Medicine

## 2019-10-19 DIAGNOSIS — G8929 Other chronic pain: Secondary | ICD-10-CM

## 2019-10-19 DIAGNOSIS — M545 Low back pain, unspecified: Secondary | ICD-10-CM

## 2019-10-19 MED ORDER — OXYCODONE-ACETAMINOPHEN 5-325 MG PO TABS: 1.0000 | ORAL_TABLET | Freq: Three times a day (TID) | ORAL | 0 refills | Status: DC | PRN

## 2019-10-19 NOTE — Telephone Encounter (Signed)
PT is scheduled on 10/22/19 Monday

## 2019-10-19 NOTE — Telephone Encounter (Signed)
Annette Ramirez requests a refill on Oxycodone. She has been scheduled for a follow up in July.

## 2019-10-19 NOTE — Telephone Encounter (Signed)
She needs to be scheduled sooner.  She was due for her f/u. She HAS TO F/U EVERY 3 MONTHS./  We can put on on PA schdule next week

## 2019-10-22 ENCOUNTER — Encounter: Payer: Self-pay | Admitting: Nurse Practitioner

## 2019-10-22 ENCOUNTER — Other Ambulatory Visit: Payer: Self-pay

## 2019-10-22 ENCOUNTER — Ambulatory Visit (INDEPENDENT_AMBULATORY_CARE_PROVIDER_SITE_OTHER): Payer: Medicare Other | Admitting: Nurse Practitioner

## 2019-10-22 VITALS — BP 120/75 | HR 80 | Temp 97.5°F | Ht 62.0 in | Wt 155.9 lb

## 2019-10-22 DIAGNOSIS — M545 Low back pain, unspecified: Secondary | ICD-10-CM

## 2019-10-22 DIAGNOSIS — G8929 Other chronic pain: Secondary | ICD-10-CM | POA: Diagnosis not present

## 2019-10-22 MED ORDER — OXYCODONE-ACETAMINOPHEN 5-325 MG PO TABS: 1.0000 | ORAL_TABLET | Freq: Three times a day (TID) | ORAL | 0 refills | Status: DC | PRN

## 2019-10-22 NOTE — Telephone Encounter (Signed)
Patient is currently having an OV with Emeterio Reeve Early, DNP, AGNP-C.

## 2019-10-22 NOTE — Progress Notes (Signed)
Established Patient Office Visit  Subjective:  Patient ID: Annette Ramirez, female    DOB: [birth year]  Age: 74 y.o. MRN: [id number]  CC:  Chief Complaint  Patient presents with  . Pain    refill oxycodone for chronic back pain, neuropathy, and fibromyalgia    HPI Annette Ramirez presents for follow-up for chronic pain management for chronic low back pain, bilaterally and fibromyalgia.  She reports her pain is stable. She did recently have surgery for breast cancer and her pain was increased during that time, but that additional pain has resolved.  She reports she takes her oxycodone in the morning upon awakening and this typically lasts until about 4pm. She reports that she does not typically take another pill after the morning dose unless she is out and needs additional pain management. She denies any nausea, vomiting, or constipation. She denies dizziness, recent falls, or new confusion.   Indication for chronic opioid: chronic back pain, neuropathy, and fibromyalgia Medication and dose: oxycodone-acetaminophen 5-325 mg tab # pills per month: 60 Last UDS date: To be done today Opioid Treatment Agreement signed (Y/N): yes Opioid Treatment Agreement last reviewed with patient:   NCCSRS reviewed this encounter (include red flags):   Yes- no red flags.   Pain at its worst: 8 /10 Pain at its best after medication: 2-3 /10  Past Medical History:  Diagnosis Date  . Anxiety   . Asthma   . COPD (chronic obstructive pulmonary disease) ([place])   . Depression   . Ductal carcinoma in situ (DCIS) of right breast 08/16/2019  . Exertional shortness of breath   . Family history of anesthesia complication    "granddaughter has PONV" (02/28/2013)  . Fibromyalgia   . GERD (gastroesophageal reflux disease)   . H/O hiatal hernia   . Hypertension   . Migraines    "in the past; they've stopped" (02/28/2013)  . Opiate use 06/06/2017  . Osteoporosis   . PONV (postoperative nausea and vomiting)    . Sinus headache     Past Surgical History:  Procedure Laterality Date  . ABDOMINAL HYSTERECTOMY  1975  . APPENDECTOMY  1975  . BREAST CYST ASPIRATION Left 1980's  . COLONOSCOPY W/ BIOPSIES AND POLYPECTOMY  2012  . DILATION AND CURETTAGE OF UTERUS  1960's  . EXCISIONAL HEMORRHOIDECTOMY  1980's  . LUMBAR LAMINECTOMY  02/28/2013  . LUMBAR LAMINECTOMY/DECOMPRESSION MICRODISCECTOMY N/A 02/28/2013   Procedure: LUMBAR LAMINECTOMY/DECOMPRESSION MICRODISCECTOMY/Lumbar 4-5 decompression;  Surgeon: Sinclair Ship, MD;  Location: [place];  Service: Orthopedics;  Laterality: N/A;  Lumbar 4-5 decompression  . TONSILLECTOMY  1950  . TUBAL LIGATION  1972    Family History  Problem Relation Age of Onset  . Alzheimer's disease Mother   . Prostate cancer Other        grandfather  . Stroke Other        grandparent    Social History   Socioeconomic History  . Marital status: Married    Spouse name: [name]  . Number of children: 2  . Years of education: 44  . Highest education level: Some college, no degree  Occupational History  . Occupation: worked for attorney    Comment: retired  Tobacco Use  . Smoking status: Current Some Day Smoker    Packs/day: 0.25    Years: 40.00    Pack years: 10.00    Types: Cigarettes  . Smokeless tobacco: Never Used  . Tobacco comment: 02/28/2013 "started smoking eletronic cigarettes a couple years ago"  Vaping Use  . Vaping Use: Never used  Substance and Sexual Activity  . Alcohol use: Yes    Alcohol/week: 5.0 standard drinks    Types: 5 Glasses of wine per week    Comment: occasionally social  . Drug use: No  . Sexual activity: Not Currently  Other Topics Concern  . 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   Reads a lot during the day. Enjoys shopping.   Caffeine in the morning- 1 cup f coffee   Social Determinants of Health   Financial Resource Strain:   . Difficulty of Paying Living Expenses:   Food  Insecurity:   . Worried About Charity fundraiser in the Last Year:   . Arboriculturist in the Last Year:   Transportation Needs:   . Film/video editor (Medical):   Marland Kitchen Lack of Transportation (Non-Medical):   Physical Activity:   . Days of Exercise per Week:   . Minutes of Exercise per Session:   Stress:   . Feeling of Stress :   Social Connections:   . Frequency of Communication with Friends and Family:   . Frequency of Social Gatherings with Friends and Family:   . Attends Religious Services:   . Active Member of Clubs or Organizations:   . Attends Archivist Meetings:   Marland Kitchen Marital Status:   Intimate Partner Violence:   . Fear of Current or Ex-Partner:   . Emotionally Abused:   Marland Kitchen Physically Abused:   . Sexually Abused:     Outpatient Medications Prior to Visit  Medication Sig Dispense Refill  . albuterol (PROVENTIL HFA;VENTOLIN HFA) 108 (90 Base) MCG/ACT inhaler Inhale 2 puffs into the lungs every 6 (six) hours as needed for wheezing. 54 g 3  . amitriptyline (ELAVIL) 25 MG tablet TAKE 1 TABLET BY MOUTH AT  BEDTIME 90 tablet 1  . amLODipine (NORVASC) 5 MG tablet TAKE ONE TABLET BY MOUTH EVERY DAY 90 tablet 1  . atenolol (TENORMIN) 100 MG tablet Take 1 tablet (100 mg total) by mouth 2 (two) times daily. 180 tablet 1  . atorvastatin (LIPITOR) 20 MG tablet TAKE 1 TABLET BY MOUTH  DAILY 90 tablet 3  . Cholecalciferol (VITAMIN D3 PO) Take 1 capsule by mouth daily.    . cholestyramine (QUESTRAN) 4 g packet Take one packet (4 g dose) by mouth at bedtime.    . diphenhydrAMINE (BENADRYL) 25 mg capsule Take 1 capsule by mouth every 6 (six) hours as needed.    Marland Kitchen FLUoxetine (PROZAC) 40 MG capsule TAKE 1 CAPSULE BY MOUTH  DAILY 90 capsule 3  . gabapentin (NEURONTIN) 100 MG capsule TAKE ONE CAPSULE BY MOUTH  EVERY MORNING, ONE CAPSULE  AT NOON, THEN THREE  CAPSULES AT BEDTIME 450 capsule 3  . loperamide (IMODIUM) 2 MG capsule TAKE ONE CAPSULE BY MOUTH FOUR TIMES A DAY AS NEEDED  FOR DIARRHEA.  1  . losartan (COZAAR) 100 MG tablet TAKE 1 TABLET BY MOUTH  DAILY 90 tablet 3  . MAGNESIUM PO Take 1 tablet by mouth daily.    . meclizine (ANTIVERT) 25 MG tablet TAKE 1 TABLET BY MOUTH 3  TIMES DAILY AS NEEDED FOR  DIZZINESS 180 tablet 1  . omeprazole (PRILOSEC) 40 MG capsule TAKE 1 CAPSULE BY MOUTH  DAILY 90 capsule 3  . oxyCODONE-acetaminophen (PERCOCET/ROXICET) 5-325 MG tablet Take 1 tablet by mouth every 8 (eight) hours as needed. for pain 6 tablet 0  .  QUEtiapine (SEROQUEL) 200 MG tablet Take 1 tablet (200 mg total) by mouth at bedtime. 90 tablet 1  . Tiotropium Bromide Monohydrate (SPIRIVA RESPIMAT) 2.5 MCG/ACT AERS Inhale 2 puffs into the lungs every morning. 12 g PRN  . tolterodine (DETROL LA) 2 MG 24 hr capsule Take 1 capsule (2 mg total) by mouth in the morning. 30 capsule 1   No facility-administered medications prior to visit.    Allergies  Allergen Reactions  . Amoxicillin-Pot Clavulanate     REACTION: nausea and abdominal pain  . Aspirin     REACTION: upset stomach  . Codeine     REACTION: nausea  . Hctz [Hydrochlorothiazide] Other (See Comments)    Hyponatremia  . Nsaids Nausea Only  . Other Nausea And Vomiting    ROS Review of Systems  Constitutional: Positive for fatigue. Negative for activity change, chills and fever.  Respiratory: Negative for cough, chest tightness and shortness of breath.   Cardiovascular: Negative for chest pain, palpitations and leg swelling.  Gastrointestinal: Positive for diarrhea. Negative for constipation, nausea and vomiting.  Neurological: Negative for dizziness, tremors, syncope, weakness, light-headedness and headaches.  Psychiatric/Behavioral: Positive for dysphoric mood and sleep disturbance. Negative for agitation and confusion.      Objective:    Physical Exam Vitals and nursing note reviewed.  Constitutional:      Appearance: Normal appearance. She is normal weight.  Eyes:     Extraocular Movements:  Extraocular movements intact.     Conjunctiva/sclera: Conjunctivae normal.     Pupils: Pupils are equal, round, and reactive to light.  Cardiovascular:     Rate and Rhythm: Normal rate and regular rhythm.     Pulses: Normal pulses.     Heart sounds: Normal heart sounds.  Pulmonary:     Effort: Pulmonary effort is normal.     Breath sounds: Normal breath sounds.  Abdominal:     General: Abdomen is flat. Bowel sounds are normal.     Palpations: Abdomen is soft.  Musculoskeletal:        General: Normal range of motion.     Cervical back: Normal range of motion.  Skin:    General: Skin is warm and dry.     Capillary Refill: Capillary refill takes less than 2 seconds.  Neurological:     General: No focal deficit present.     Mental Status: She is alert and oriented to person, place, and time.  Psychiatric:        Mood and Affect: Mood normal.        Behavior: Behavior normal.        Thought Content: Thought content normal.        Judgment: Judgment normal.     BP 120/75   Pulse 80   Temp (!) 97.5 F (36.4 C) (Oral)   Ht 5\' 2"  (1.575 m)   Wt 155 lb 14.4 oz (70.7 kg)   SpO2 98%   BMI 28.51 kg/m  Wt Readings from Last 3 Encounters:  10/22/19 155 lb 14.4 oz (70.7 kg)  08/23/19 155 lb (70.3 kg)  08/17/19 156 lb (70.8 kg)     There are no preventive care reminders to display for this patient.  There are no preventive care reminders to display for this patient.  Lab Results  Component Value Date   TSH 2.42 09/29/2017   Lab Results  Component Value Date   WBC 6.9 08/17/2019   HGB 11.9 08/17/2019   HCT 34.5 (L) 08/17/2019  MCV 96.9 08/17/2019   PLT 245 08/17/2019   Lab Results  Component Value Date   NA 139 08/17/2019   K 3.9 08/17/2019   CO2 27 08/17/2019   GLUCOSE 122 08/17/2019   BUN 16 08/17/2019   CREATININE 1.21 (H) 08/17/2019   BILITOT 0.7 08/17/2019   ALKPHOS 58 12/09/2016   AST 23 08/17/2019   ALT 14 08/17/2019   PROT 6.3 08/17/2019   ALBUMIN 4.5  12/09/2016   CALCIUM 8.8 08/17/2019   Lab Results  Component Value Date   CHOL 177 03/21/2019   Lab Results  Component Value Date   HDL 80 03/21/2019   Lab Results  Component Value Date   LDLCALC 73 03/21/2019   Lab Results  Component Value Date   TRIG 159 (H) 03/21/2019   Lab Results  Component Value Date   CHOLHDL 2.2 03/21/2019   Lab Results  Component Value Date   HGBA1C 5.5 04/19/2019      Assessment & Plan:   1. Encounter for chronic pain management 2. Chronic bilateral low back pain without sciatica Pain well controlled on current medication regimen. No changes to medication today.   PLAN: - UDS today  - Oxycodone-Acetaminophen 5-325 mg 1 tablet every 8 hours as needed for pain- #60 per month. -Pain contract monitored by PCP - Follow-up in 3 months for further pain management  - DRUG MONITORING, PANEL 6 WITH CONFIRMATION, URINE   Orma Render, NP

## 2019-10-24 ENCOUNTER — Other Ambulatory Visit: Payer: Self-pay | Admitting: Family Medicine

## 2019-10-25 ENCOUNTER — Telehealth: Payer: Self-pay

## 2019-10-25 DIAGNOSIS — M25511 Pain in right shoulder: Secondary | ICD-10-CM | POA: Diagnosis not present

## 2019-10-25 DIAGNOSIS — J449 Chronic obstructive pulmonary disease, unspecified: Secondary | ICD-10-CM | POA: Diagnosis not present

## 2019-10-25 DIAGNOSIS — Z853 Personal history of malignant neoplasm of breast: Secondary | ICD-10-CM | POA: Diagnosis not present

## 2019-10-25 LAB — DRUG MONITORING, PANEL 6 WITH CONFIRMATION, URINE
6 Acetylmorphine: NEGATIVE ng/mL (ref <10)
Alcohol Metabolites: POSITIVE ng/mL — AB
Amphetamines: NEGATIVE ng/mL (ref <500)
Barbiturates: NEGATIVE ng/mL (ref <300)
Benzodiazepines: NEGATIVE ng/mL (ref <100)
Cocaine Metabolite: NEGATIVE ng/mL (ref <150)
Codeine: NEGATIVE ng/mL (ref <50)
Creatinine: 90.7 mg/dL
Ethyl Glucuronide (ETG): 14145 ng/mL — ABNORMAL HIGH (ref <500)
Ethyl Sulfate (ETS): 5300 ng/mL — ABNORMAL HIGH (ref <100)
Hydrocodone: NEGATIVE ng/mL (ref <50)
Hydromorphone: NEGATIVE ng/mL (ref <50)
Marijuana Metabolite: NEGATIVE ng/mL (ref <20)
Methadone Metabolite: NEGATIVE ng/mL (ref <100)
Morphine: NEGATIVE ng/mL (ref <50)
Norhydrocodone: NEGATIVE ng/mL (ref <50)
Noroxycodone: 2171 ng/mL — ABNORMAL HIGH (ref <50)
Opiates: NEGATIVE ng/mL (ref <100)
Oxidant: NEGATIVE ug/mL
Oxycodone: 1488 ng/mL — ABNORMAL HIGH (ref <50)
Oxycodone: POSITIVE ng/mL — AB (ref <100)
Oxymorphone: 75 ng/mL — ABNORMAL HIGH (ref <50)
Phencyclidine: NEGATIVE ng/mL (ref <25)
pH: 5.5 (ref 4.5–9.0)

## 2019-10-25 LAB — DM TEMPLATE

## 2019-10-25 MED ORDER — ATENOLOL 100 MG PO TABS: 100.0000 mg | ORAL_TABLET | Freq: Two times a day (BID) | ORAL | 0 refills | Status: DC

## 2019-10-25 NOTE — Telephone Encounter (Signed)
Atenolol sent to mail order yesterday, patient completely out. Sending 2 week supply to local pharmacy.

## 2019-10-31 NOTE — Progress Notes (Signed)
Your urine drug screen has resulted and showed adequate use of your prescription pain medication. There were no other metabolites of other medications present, which is consistent with your pain contract.   There were very high levels of metabolites present for alcohol. This indicates that alcohol was consumed recent to the test. It is very dangerous to mix alcohol with oxycodone (and other pain medications) due to the increased risk of respiratory depression, health complications, and death.    Alcohol consumption can also increase your risk of liver damage when consumed with medications containing acetaminophen, which your medication has with it.   I highly recommend you avoid all alcohol containing substances while taking this medication to avoid serious and life threatening complications.

## 2019-11-14 ENCOUNTER — Ambulatory Visit: Payer: Medicare Other | Admitting: Family Medicine

## 2019-11-20 DIAGNOSIS — C50911 Malignant neoplasm of unspecified site of right female breast: Secondary | ICD-10-CM | POA: Diagnosis not present

## 2019-11-21 ENCOUNTER — Other Ambulatory Visit: Payer: Self-pay | Admitting: Family Medicine

## 2019-11-24 DIAGNOSIS — J449 Chronic obstructive pulmonary disease, unspecified: Secondary | ICD-10-CM | POA: Diagnosis not present

## 2019-12-25 DIAGNOSIS — J449 Chronic obstructive pulmonary disease, unspecified: Secondary | ICD-10-CM | POA: Diagnosis not present

## 2020-01-04 DIAGNOSIS — M810 Age-related osteoporosis without current pathological fracture: Secondary | ICD-10-CM | POA: Diagnosis not present

## 2020-01-04 DIAGNOSIS — D0511 Intraductal carcinoma in situ of right breast: Secondary | ICD-10-CM | POA: Diagnosis not present

## 2020-01-04 DIAGNOSIS — I890 Lymphedema, not elsewhere classified: Secondary | ICD-10-CM | POA: Diagnosis not present

## 2020-01-04 DIAGNOSIS — R197 Diarrhea, unspecified: Secondary | ICD-10-CM | POA: Diagnosis not present

## 2020-01-04 DIAGNOSIS — Z87891 Personal history of nicotine dependence: Secondary | ICD-10-CM | POA: Diagnosis not present

## 2020-01-04 DIAGNOSIS — Z86010 Personal history of colonic polyps: Secondary | ICD-10-CM | POA: Diagnosis not present

## 2020-01-04 DIAGNOSIS — I10 Essential (primary) hypertension: Secondary | ICD-10-CM | POA: Diagnosis not present

## 2020-01-04 DIAGNOSIS — M1990 Unspecified osteoarthritis, unspecified site: Secondary | ICD-10-CM | POA: Diagnosis not present

## 2020-01-09 ENCOUNTER — Encounter: Payer: Self-pay | Admitting: Family Medicine

## 2020-01-09 ENCOUNTER — Ambulatory Visit (INDEPENDENT_AMBULATORY_CARE_PROVIDER_SITE_OTHER): Payer: Medicare Other | Admitting: Family Medicine

## 2020-01-09 VITALS — BP 125/84 | HR 66 | Ht 62.0 in | Wt 161.0 lb

## 2020-01-09 DIAGNOSIS — G8929 Other chronic pain: Secondary | ICD-10-CM

## 2020-01-09 DIAGNOSIS — D0511 Intraductal carcinoma in situ of right breast: Secondary | ICD-10-CM | POA: Diagnosis not present

## 2020-01-09 DIAGNOSIS — Z23 Encounter for immunization: Secondary | ICD-10-CM | POA: Diagnosis not present

## 2020-01-09 DIAGNOSIS — M545 Low back pain, unspecified: Secondary | ICD-10-CM

## 2020-01-09 DIAGNOSIS — I10 Essential (primary) hypertension: Secondary | ICD-10-CM

## 2020-01-09 MED ORDER — OXYCODONE-ACETAMINOPHEN 5-325 MG PO TABS: 1.0000 | ORAL_TABLET | Freq: Three times a day (TID) | ORAL | 0 refills | Status: DC | PRN

## 2020-01-09 NOTE — Assessment & Plan Note (Signed)
Status post right mastectomy-we will be scheduling with lymphedema clinic soon.  She reports the surgery is gone well she still is having some numbness and pain.

## 2020-01-09 NOTE — Progress Notes (Signed)
Established Patient Office Visit  Subjective:  Patient ID: Annette Ramirez, female    DOB: 15-Feb-1946  Age: 74 y.o. MRN: [id number]  CC:  Chief Complaint  Patient presents with  . pain management    HPI Annette Ramirez presents for Follow-up of chronic pain management for chronic low back pain and fibromyalgia.  She is actually doing well overall she was recently diagnosed with ductal carcinoma in situ of the right breast and had a mastectomy.  He actually did really well with the surgery but then ended up being hospitalized in April for COPD exacerbation.  She is now dealing with some lymphedema in that right axillary L area but they are getting her set up with the lymphedema clinic.  Sleep is still poor overall.  But this is more chronic.  She has been under little bit more stress recently her daughter has been hospitalized a couple times for A. fib and heart failure she is worried about what will happen to her daughter when she and her husband passed away.  Past Medical History:  Diagnosis Date  . Anxiety   . Asthma   . COPD (chronic obstructive pulmonary disease) ([place])   . Depression   . Ductal carcinoma in situ (DCIS) of right breast 08/16/2019  . Exertional shortness of breath   . Family history of anesthesia complication    "granddaughter has PONV" (02/28/2013)  . Fibromyalgia   . GERD (gastroesophageal reflux disease)   . H/O hiatal hernia   . Hypertension   . Migraines    "in the past; they've stopped" (02/28/2013)  . Opiate use 06/06/2017  . Osteoporosis   . PONV (postoperative nausea and vomiting)   . Sinus headache     Past Surgical History:  Procedure Laterality Date  . ABDOMINAL HYSTERECTOMY  1975  . APPENDECTOMY  1975  . BREAST CYST ASPIRATION Left 1980's  . COLONOSCOPY W/ BIOPSIES AND POLYPECTOMY  2012  . DILATION AND CURETTAGE OF UTERUS  1960's  . EXCISIONAL HEMORRHOIDECTOMY  1980's  . LUMBAR LAMINECTOMY  02/28/2013  . LUMBAR  LAMINECTOMY/DECOMPRESSION MICRODISCECTOMY N/A 02/28/2013   Procedure: LUMBAR LAMINECTOMY/DECOMPRESSION MICRODISCECTOMY/Lumbar 4-5 decompression;  Surgeon: Sinclair Ship, MD;  Location: [place];  Service: Orthopedics;  Laterality: N/A;  Lumbar 4-5 decompression  . TONSILLECTOMY  1950  . TUBAL LIGATION  1972    Family History  Problem Relation Age of Onset  . Alzheimer's disease Mother   . Prostate cancer Other        grandfather  . Stroke Other        grandparent    Social History   Socioeconomic History  . Marital status: Married    Spouse name: [name]  . Number of children: 2  . Years of education: 63  . Highest education level: Some college, no degree  Occupational History  . Occupation: worked for attorney    Comment: retired  Tobacco Use  . Smoking status: Current Some Day Smoker    Packs/day: 0.25    Years: 40.00    Pack years: 10.00    Types: Cigarettes  . Smokeless tobacco: Never Used  . Tobacco comment: 02/28/2013 "started smoking eletronic cigarettes a couple years ago"  Vaping Use  . Vaping Use: Never used  Substance and Sexual Activity  . Alcohol use: Yes    Alcohol/week: 5.0 standard drinks    Types: 5 Glasses of wine per week    Comment: occasionally social  . Drug use: No  . Sexual activity:  Not Currently  Other Topics Concern  . 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   Reads a lot during the day. Enjoys shopping.   Caffeine in the morning- 1 cup f coffee   Social Determinants of Health   Financial Resource Strain:   . Difficulty of Paying Living Expenses: Not on file  Food Insecurity:   . Worried About Charity fundraiser in the Last Year: Not on file  . Ran Out of Food in the Last Year: Not on file  Transportation Needs:   . Lack of Transportation (Medical): Not on file  . Lack of Transportation (Non-Medical): Not on file  Physical Activity:   . Days of Exercise per Week: Not on file  . Minutes of Exercise  per Session: Not on file  Stress:   . Feeling of Stress : Not on file  Social Connections:   . Frequency of Communication with Friends and Family: Not on file  . Frequency of Social Gatherings with Friends and Family: Not on file  . Attends Religious Services: Not on file  . Active Member of Clubs or Organizations: Not on file  . Attends Archivist Meetings: Not on file  . Marital Status: Not on file  Intimate Partner Violence:   . Fear of Current or Ex-Partner: Not on file  . Emotionally Abused: Not on file  . Physically Abused: Not on file  . Sexually Abused: Not on file    Outpatient Medications Prior to Visit  Medication Sig Dispense Refill  . albuterol (PROVENTIL HFA;VENTOLIN HFA) 108 (90 Base) MCG/ACT inhaler Inhale 2 puffs into the lungs every 6 (six) hours as needed for wheezing. 54 g 3  . amitriptyline (ELAVIL) 25 MG tablet TAKE 1 TABLET BY MOUTH AT  BEDTIME 90 tablet 1  . amLODipine (NORVASC) 5 MG tablet TAKE ONE TABLET BY MOUTH EVERY DAY 90 tablet 1  . atenolol (TENORMIN) 100 MG tablet Take 1 tablet (100 mg total) by mouth 2 (two) times daily. 2 week supply while waiting on mail order 28 tablet 0  . atorvastatin (LIPITOR) 20 MG tablet TAKE 1 TABLET BY MOUTH  DAILY 90 tablet 3  . Cholecalciferol (VITAMIN D3 PO) Take 1 capsule by mouth daily.    . cholestyramine (QUESTRAN) 4 g packet Take one packet (4 g dose) by mouth at bedtime.    Marland Kitchen FLUoxetine (PROZAC) 40 MG capsule TAKE 1 CAPSULE BY MOUTH  DAILY 90 capsule 3  . gabapentin (NEURONTIN) 100 MG capsule TAKE ONE CAPSULE BY MOUTH  EVERY MORNING, ONE CAPSULE  AT NOON, THEN THREE  CAPSULES AT BEDTIME 450 capsule 3  . loperamide (IMODIUM) 2 MG capsule TAKE ONE CAPSULE BY MOUTH FOUR TIMES A DAY AS NEEDED FOR DIARRHEA.  1  . losartan (COZAAR) 100 MG tablet TAKE 1 TABLET BY MOUTH  DAILY 90 tablet 3  . MAGNESIUM PO Take 1 tablet by mouth daily.    . meclizine (ANTIVERT) 25 MG tablet TAKE 1 TABLET BY MOUTH 3  TIMES DAILY AS  NEEDED FOR  DIZZINESS 180 tablet 1  . omeprazole (PRILOSEC) 40 MG capsule TAKE 1 CAPSULE BY MOUTH  DAILY 90 capsule 3  . QUEtiapine (SEROQUEL) 200 MG tablet TAKE 1 TABLET BY MOUTH AT  BEDTIME 90 tablet 3  . Tiotropium Bromide Monohydrate (SPIRIVA RESPIMAT) 2.5 MCG/ACT AERS Inhale 2 puffs into the lungs every morning. 12 g PRN  . tolterodine (DETROL LA) 2 MG 24 hr capsule  Take 1 capsule (2 mg total) by mouth in the morning. 30 capsule 1  . diphenhydrAMINE (BENADRYL) 25 mg capsule Take 1 capsule by mouth every 6 (six) hours as needed.    Marland Kitchen oxyCODONE-acetaminophen (PERCOCET/ROXICET) 5-325 MG tablet Take 1 tablet by mouth every 8 (eight) hours as needed for severe pain. for pain 60 tablet 0   No facility-administered medications prior to visit.    Allergies  Allergen Reactions  . Amoxicillin-Pot Clavulanate     REACTION: nausea and abdominal pain  . Aspirin     REACTION: upset stomach  . Codeine     REACTION: nausea  . Hctz [Hydrochlorothiazide] Other (See Comments)    Hyponatremia  . Nsaids Nausea Only  . Other Nausea And Vomiting    ROS Review of Systems    Objective:    Physical Exam Constitutional:      Appearance: She is well-developed.  HENT:     Head: Normocephalic and atraumatic.  Cardiovascular:     Rate and Rhythm: Normal rate and regular rhythm.     Heart sounds: Normal heart sounds.  Pulmonary:     Effort: Pulmonary effort is normal.     Breath sounds: Normal breath sounds.  Skin:    General: Skin is warm and dry.  Neurological:     Mental Status: She is alert and oriented to person, place, and time.  Psychiatric:        Behavior: Behavior normal.     BP 125/84   Pulse 66   Ht 5\' 2"  (1.575 m)   Wt 161 lb (73 kg)   SpO2 97%   BMI 29.45 kg/m  Wt Readings from Last 3 Encounters:  01/09/20 161 lb (73 kg)  10/22/19 155 lb 14.4 oz (70.7 kg)  08/23/19 155 lb (70.3 kg)     There are no preventive care reminders to display for this patient.  There are  no preventive care reminders to display for this patient.  Lab Results  Component Value Date   TSH 2.42 09/29/2017   Lab Results  Component Value Date   WBC 6.9 08/17/2019   HGB 11.9 08/17/2019   HCT 34.5 (L) 08/17/2019   MCV 96.9 08/17/2019   PLT 245 08/17/2019   Lab Results  Component Value Date   NA 139 08/17/2019   K 3.9 08/17/2019   CO2 27 08/17/2019   GLUCOSE 122 08/17/2019   BUN 16 08/17/2019   CREATININE 1.21 (H) 08/17/2019   BILITOT 0.7 08/17/2019   ALKPHOS 58 12/09/2016   AST 23 08/17/2019   ALT 14 08/17/2019   PROT 6.3 08/17/2019   ALBUMIN 4.5 12/09/2016   CALCIUM 8.8 08/17/2019   Lab Results  Component Value Date   CHOL 177 03/21/2019   Lab Results  Component Value Date   HDL 80 03/21/2019   Lab Results  Component Value Date   LDLCALC 73 03/21/2019   Lab Results  Component Value Date   TRIG 159 (H) 03/21/2019   Lab Results  Component Value Date   CHOLHDL 2.2 03/21/2019   Lab Results  Component Value Date   HGBA1C 5.5 04/19/2019      Assessment & Plan:   Problem List Items Addressed This Visit      Cardiovascular and Mediastinum   HYPERTENSION, BENIGN ESSENTIAL    Well controlled. Continue current regimen. Follow up in  6 mo        Other   Encounter for chronic pain management - Primary    Indication  for chronic opioid: chronic back pain, neuropathy, and fibromyalgia Medication and dose: oxycodone-acetaminophen 5-325 mg tab # pills per month: 60 Last UDS date: 10/22/2019 Opioid Treatment Agreement signed (Y/N): yes Opioid Treatment Agreement last reviewed with patient:  04/2019 N[facility] reviewed this encounter (include red flags):   Yes- no red flags.       Relevant Medications   oxyCODONE-acetaminophen (PERCOCET/ROXICET) 5-325 MG tablet   Other Relevant Orders   DRUG MONITORING, PANEL 6 WITH CONFIRMATION, URINE   Ductal carcinoma in situ (DCIS) of right breast    Status post right mastectomy-we will be scheduling with  lymphedema clinic soon.  She reports the surgery is gone well she still is having some numbness and pain.      Chronic lower back pain   Relevant Medications   oxyCODONE-acetaminophen (PERCOCET/ROXICET) 5-325 MG tablet    Other Visit Diagnoses    Need for immunization against influenza       Relevant Orders   Flu Vaccine QUAD High Dose(Fluad) (Completed)      Meds ordered this encounter  Medications  . oxyCODONE-acetaminophen (PERCOCET/ROXICET) 5-325 MG tablet    Sig: Take 1 tablet by mouth every 8 (eight) hours as needed for severe pain. for pain    Dispense:  60 tablet    Refill:  0    Follow-up: Return in about 3 months (around 04/09/2020) for Pain Management .      Beatrice Lecher, MD

## 2020-01-09 NOTE — Assessment & Plan Note (Addendum)
Indication for chronic opioid: chronic back pain, neuropathy, and fibromyalgia Medication and dose: oxycodone-acetaminophen 5-325 mg tab # pills per month: 60 Last UDS date: 10/22/2019 Opioid Treatment Agreement signed (Y/N): yes Opioid Treatment Agreement last reviewed with patient:  04/2019 N[facility] reviewed this encounter (include red flags):   Yes- no red flags.

## 2020-01-09 NOTE — Assessment & Plan Note (Signed)
Well controlled. Continue current regimen. Follow up in  6 mo  

## 2020-01-12 LAB — DRUG MONITORING, PANEL 6 WITH CONFIRMATION, URINE
6 Acetylmorphine: NEGATIVE ng/mL (ref <10)
Alcohol Metabolites: POSITIVE ng/mL — AB
Amphetamines: NEGATIVE ng/mL (ref <500)
Barbiturates: NEGATIVE ng/mL (ref <300)
Benzodiazepines: NEGATIVE ng/mL (ref <100)
Cocaine Metabolite: NEGATIVE ng/mL (ref <150)
Codeine: NEGATIVE ng/mL (ref <50)
Creatinine: 105.1 mg/dL
Ethyl Glucuronide (ETG): 9488 ng/mL — ABNORMAL HIGH (ref <500)
Ethyl Sulfate (ETS): 2007 ng/mL — ABNORMAL HIGH (ref <100)
Hydrocodone: NEGATIVE ng/mL (ref <50)
Hydromorphone: NEGATIVE ng/mL (ref <50)
Marijuana Metabolite: NEGATIVE ng/mL (ref <20)
Methadone Metabolite: NEGATIVE ng/mL (ref <100)
Morphine: NEGATIVE ng/mL (ref <50)
Norhydrocodone: NEGATIVE ng/mL (ref <50)
Noroxycodone: 1069 ng/mL — ABNORMAL HIGH (ref <50)
Opiates: NEGATIVE ng/mL (ref <100)
Oxidant: NEGATIVE ug/mL
Oxycodone: 974 ng/mL — ABNORMAL HIGH (ref <50)
Oxycodone: POSITIVE ng/mL — AB (ref <100)
Oxymorphone: NEGATIVE ng/mL (ref <50)
Phencyclidine: NEGATIVE ng/mL (ref <25)
pH: 5.5 (ref 4.5–9.0)

## 2020-01-12 LAB — DM TEMPLATE

## 2020-01-14 DIAGNOSIS — D0511 Intraductal carcinoma in situ of right breast: Secondary | ICD-10-CM | POA: Diagnosis not present

## 2020-01-14 DIAGNOSIS — M79621 Pain in right upper arm: Secondary | ICD-10-CM | POA: Diagnosis not present

## 2020-01-14 DIAGNOSIS — Z9011 Acquired absence of right breast and nipple: Secondary | ICD-10-CM | POA: Diagnosis not present

## 2020-01-14 DIAGNOSIS — I890 Lymphedema, not elsewhere classified: Secondary | ICD-10-CM | POA: Diagnosis not present

## 2020-01-14 DIAGNOSIS — R600 Localized edema: Secondary | ICD-10-CM | POA: Diagnosis not present

## 2020-01-25 DIAGNOSIS — J449 Chronic obstructive pulmonary disease, unspecified: Secondary | ICD-10-CM | POA: Diagnosis not present

## 2020-02-24 DIAGNOSIS — J449 Chronic obstructive pulmonary disease, unspecified: Secondary | ICD-10-CM | POA: Diagnosis not present

## 2020-03-03 DIAGNOSIS — Z9011 Acquired absence of right breast and nipple: Secondary | ICD-10-CM | POA: Diagnosis not present

## 2020-03-03 DIAGNOSIS — D0511 Intraductal carcinoma in situ of right breast: Secondary | ICD-10-CM | POA: Diagnosis not present

## 2020-03-06 ENCOUNTER — Encounter: Payer: Self-pay | Admitting: Family Medicine

## 2020-03-06 ENCOUNTER — Ambulatory Visit (INDEPENDENT_AMBULATORY_CARE_PROVIDER_SITE_OTHER): Payer: Medicare Other | Admitting: Family Medicine

## 2020-03-06 VITALS — BP 120/62 | HR 71 | Ht 62.0 in | Wt 164.0 lb

## 2020-03-06 DIAGNOSIS — I10 Essential (primary) hypertension: Secondary | ICD-10-CM | POA: Diagnosis not present

## 2020-03-06 DIAGNOSIS — D0511 Intraductal carcinoma in situ of right breast: Secondary | ICD-10-CM

## 2020-03-06 DIAGNOSIS — N1831 Chronic kidney disease, stage 3a: Secondary | ICD-10-CM | POA: Diagnosis not present

## 2020-03-06 DIAGNOSIS — I700 Atherosclerosis of aorta: Secondary | ICD-10-CM

## 2020-03-06 DIAGNOSIS — G8929 Other chronic pain: Secondary | ICD-10-CM | POA: Diagnosis not present

## 2020-03-06 DIAGNOSIS — J42 Unspecified chronic bronchitis: Secondary | ICD-10-CM

## 2020-03-06 NOTE — Assessment & Plan Note (Signed)
Has had a follow-up with the surgeon and she is doing well with plan will be to follow-up in 6 months with the surgeon as well as get her updated mammogram for her left breast.

## 2020-03-06 NOTE — Assessment & Plan Note (Signed)
Indication for chronic opioid:chronic back pain, neuropathy, and fibromyalgia Medication and dose:oxycodone-acetaminophen 5-325 mg tab # pills per month: 60 Last UDS date:03/06/2020 Opioid Treatment Agreement signed (Y/N):yes Opioid Treatment Agreement last reviewed with patient: 04/2019 N[facility] reviewed this encounter (include red flags):Yes- no red flags.

## 2020-03-06 NOTE — Addendum Note (Signed)
Addended by: Teddy Spike on: 03/06/2020 12:14 PM   Modules accepted: Orders

## 2020-03-06 NOTE — Progress Notes (Signed)
Established Patient Office Visit  Subjective:  Patient ID: Annette Ramirez, female    DOB: [birth year]  Age: 74 y.o. MRN: [id number]  CC: No chief complaint on file.   HPI Annette Ramirez presents for chronic pain management.  She has actually quit drinking alcohol since I last saw her.  She is really started to make some changes since her diagnosis of breast cancer in her total right mastectomy.  She says in retrospect she actually wishes she had gone ahead and had the left done as well and is still considering it but says that they will discuss it in the future.  She just had a follow-up last week.  She is actually quit smoking and quit drinking alcohol now.   She is following up for chronic pain management.  She is doing well on her oxycodone but we had previously found alcohol in her urine drug screen twice and encouraged her to discontinue alcohol use or consider tapering her medication she says she has quit drinking alcohol and is back today to repeat her urine drug screen.  Hypertension- Pt denies chest pain, SOB, dizziness, or heart palpitations.  Taking meds as directed w/o problems.  Denies medication side effects.      Past Medical History:  Diagnosis Date  . Anxiety   . Asthma   . COPD (chronic obstructive pulmonary disease) ([place])   . Depression   . Ductal carcinoma in situ (DCIS) of right breast 08/16/2019  . Exertional shortness of breath   . Family history of anesthesia complication    "granddaughter has PONV" (02/28/2013)  . Fibromyalgia   . GERD (gastroesophageal reflux disease)   . H/O hiatal hernia   . Hypertension   . Migraines    "in the past; they've stopped" (02/28/2013)  . Opiate use 06/06/2017  . Osteoporosis   . PONV (postoperative nausea and vomiting)   . Sinus headache     Past Surgical History:  Procedure Laterality Date  . ABDOMINAL HYSTERECTOMY  1975  . APPENDECTOMY  1975  . BREAST CYST ASPIRATION Left 1980's  . COLONOSCOPY W/ BIOPSIES AND  POLYPECTOMY  2012  . DILATION AND CURETTAGE OF UTERUS  1960's  . EXCISIONAL HEMORRHOIDECTOMY  1980's  . LUMBAR LAMINECTOMY  02/28/2013  . LUMBAR LAMINECTOMY/DECOMPRESSION MICRODISCECTOMY N/A 02/28/2013   Procedure: LUMBAR LAMINECTOMY/DECOMPRESSION MICRODISCECTOMY/Lumbar 4-5 decompression;  Surgeon: Sinclair Ship, MD;  Location: [facility];  Service: Orthopedics;  Laterality: N/A;  Lumbar 4-5 decompression  . TONSILLECTOMY  1950  . TUBAL LIGATION  1972    Family History  Problem Relation Age of Onset  . Alzheimer's disease Mother   . Prostate cancer Other        grandfather  . Stroke Other        grandparent    Social History   Socioeconomic History  . Marital status: Married    Spouse name: [name]  . Number of children: 2  . Years of education: 5  . Highest education level: Some college, no degree  Occupational History  . Occupation: worked for attorney    Comment: retired  Tobacco Use  . Smoking status: Former Smoker    Packs/day: 0.25    Years: 40.00    Pack years: 10.00    Types: Cigarettes    Quit date: 02/01/2020    Years since quitting: 0.0  . Smokeless tobacco: Never Used  . Tobacco comment: 02/28/2013 "started smoking eletronic cigarettes a couple years ago"  Vaping Use  . Vaping Use:  Never used  Substance and Sexual Activity  . Alcohol use: Not Currently    Comment: occasionally social  . Drug use: No  . Sexual activity: Not Currently  Other Topics Concern  . 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   Reads a lot during the day. Enjoys shopping.   Caffeine in the morning- 1 cup f coffee   Social Determinants of Health   Financial Resource Strain:   . Difficulty of Paying Living Expenses: Not on file  Food Insecurity:   . Worried About Charity fundraiser in the Last Year: Not on file  . Ran Out of Food in the Last Year: Not on file  Transportation Needs:   . Lack of Transportation (Medical): Not on file  . Lack of  Transportation (Non-Medical): Not on file  Physical Activity:   . Days of Exercise per Week: Not on file  . Minutes of Exercise per Session: Not on file  Stress:   . Feeling of Stress : Not on file  Social Connections:   . Frequency of Communication with Friends and Family: Not on file  . Frequency of Social Gatherings with Friends and Family: Not on file  . Attends Religious Services: Not on file  . Active Member of Clubs or Organizations: Not on file  . Attends Archivist Meetings: Not on file  . Marital Status: Not on file  Intimate Partner Violence:   . Fear of Current or Ex-Partner: Not on file  . Emotionally Abused: Not on file  . Physically Abused: Not on file  . Sexually Abused: Not on file    Outpatient Medications Prior to Visit  Medication Sig Dispense Refill  . albuterol (PROVENTIL HFA;VENTOLIN HFA) 108 (90 Base) MCG/ACT inhaler Inhale 2 puffs into the lungs every 6 (six) hours as needed for wheezing. 54 g 3  . amitriptyline (ELAVIL) 25 MG tablet TAKE 1 TABLET BY MOUTH AT  BEDTIME 90 tablet 1  . amLODipine (NORVASC) 5 MG tablet TAKE ONE TABLET BY MOUTH EVERY DAY 90 tablet 1  . atenolol (TENORMIN) 100 MG tablet Take 1 tablet (100 mg total) by mouth 2 (two) times daily. 2 week supply while waiting on mail order 28 tablet 0  . atorvastatin (LIPITOR) 20 MG tablet TAKE 1 TABLET BY MOUTH  DAILY 90 tablet 3  . Cholecalciferol (VITAMIN D3 PO) Take 1 capsule by mouth daily.    Marland Kitchen FLUoxetine (PROZAC) 40 MG capsule TAKE 1 CAPSULE BY MOUTH  DAILY 90 capsule 3  . gabapentin (NEURONTIN) 100 MG capsule TAKE ONE CAPSULE BY MOUTH  EVERY MORNING, ONE CAPSULE  AT NOON, THEN THREE  CAPSULES AT BEDTIME 450 capsule 3  . loperamide (IMODIUM) 2 MG capsule TAKE ONE CAPSULE BY MOUTH FOUR TIMES A DAY AS NEEDED FOR DIARRHEA.  1  . losartan (COZAAR) 100 MG tablet TAKE 1 TABLET BY MOUTH  DAILY 90 tablet 3  . MAGNESIUM PO Take 1 tablet by mouth daily.    . meclizine (ANTIVERT) 25 MG tablet  TAKE 1 TABLET BY MOUTH 3  TIMES DAILY AS NEEDED FOR  DIZZINESS 180 tablet 1  . omeprazole (PRILOSEC) 40 MG capsule TAKE 1 CAPSULE BY MOUTH  DAILY 90 capsule 3  . oxyCODONE-acetaminophen (PERCOCET/ROXICET) 5-325 MG tablet Take 1 tablet by mouth every 8 (eight) hours as needed for severe pain. for pain 60 tablet 0  . QUEtiapine (SEROQUEL) 200 MG tablet TAKE 1 TABLET BY MOUTH AT  BEDTIME 90 tablet 3  . Tiotropium Bromide Monohydrate (SPIRIVA RESPIMAT) 2.5 MCG/ACT AERS Inhale 2 puffs into the lungs every morning. 12 g PRN  . tolterodine (DETROL LA) 2 MG 24 hr capsule Take 1 capsule (2 mg total) by mouth in the morning. 30 capsule 1  . cholestyramine (QUESTRAN) 4 g packet Take one packet (4 g dose) by mouth at bedtime.     No facility-administered medications prior to visit.    Allergies  Allergen Reactions  . Amoxicillin-Pot Clavulanate     REACTION: nausea and abdominal pain  . Aspirin     REACTION: upset stomach  . Codeine     REACTION: nausea  . Hctz [Hydrochlorothiazide] Other (See Comments)    Hyponatremia  . Nsaids Nausea Only  . Other Nausea And Vomiting    ROS Review of Systems    Objective:    Physical Exam Constitutional:      Appearance: She is well-developed.  HENT:     Head: Normocephalic and atraumatic.  Cardiovascular:     Rate and Rhythm: Normal rate and regular rhythm.     Heart sounds: Normal heart sounds.  Pulmonary:     Effort: Pulmonary effort is normal.     Breath sounds: Normal breath sounds.  Skin:    General: Skin is warm and dry.  Neurological:     Mental Status: She is alert and oriented to person, place, and time.  Psychiatric:        Behavior: Behavior normal.     BP 120/62   Pulse 71   Ht 5\' 2"  (1.575 m)   Wt 164 lb (74.4 kg)   SpO2 96%   BMI 30.00 kg/m  Wt Readings from Last 3 Encounters:  03/06/20 164 lb (74.4 kg)  01/09/20 161 lb (73 kg)  10/22/19 155 lb 14.4 oz (70.7 kg)     There are no preventive care reminders to  display for this patient.  There are no preventive care reminders to display for this patient.  Lab Results  Component Value Date   TSH 2.42 09/29/2017   Lab Results  Component Value Date   WBC 6.9 08/17/2019   HGB 11.9 08/17/2019   HCT 34.5 (L) 08/17/2019   MCV 96.9 08/17/2019   PLT 245 08/17/2019   Lab Results  Component Value Date   NA 139 08/17/2019   K 3.9 08/17/2019   CO2 27 08/17/2019   GLUCOSE 122 08/17/2019   BUN 16 08/17/2019   CREATININE 1.21 (H) 08/17/2019   BILITOT 0.7 08/17/2019   ALKPHOS 58 12/09/2016   AST 23 08/17/2019   ALT 14 08/17/2019   PROT 6.3 08/17/2019   ALBUMIN 4.5 12/09/2016   CALCIUM 8.8 08/17/2019   Lab Results  Component Value Date   CHOL 177 03/21/2019   Lab Results  Component Value Date   HDL 80 03/21/2019   Lab Results  Component Value Date   LDLCALC 73 03/21/2019   Lab Results  Component Value Date   TRIG 159 (H) 03/21/2019   Lab Results  Component Value Date   CHOLHDL 2.2 03/21/2019   Lab Results  Component Value Date   HGBA1C 5.5 04/19/2019      Assessment & Plan:   Problem List Items Addressed This Visit      Cardiovascular and Mediastinum   HYPERTENSION, BENIGN ESSENTIAL - Primary    Pressure looks great today.  She is technically due for labs in about a week or 2 so go ahead and print  those off today.      Relevant Orders   COMPLETE METABOLIC PANEL WITH GFR   Lipid panel   CBC   Hemoglobin A1c   Arteriosclerosis of aorta (HCC)    Continue atorvastatin daily.  Due for updated lipids.        Respiratory   COPD (chronic obstructive pulmonary disease) (HCC)    Stable.  She has actually quit smoking which is fantastic.        Genitourinary   CKD (chronic kidney disease) stage 3, GFR 30-59 ml/min (HCC)    Continue to follow renal function every 6 months.      Relevant Orders   COMPLETE METABOLIC PANEL WITH GFR   CBC     Other   Encounter for chronic pain management    Indication for chronic  opioid:chronic back pain, neuropathy, and fibromyalgia Medication and dose:oxycodone-acetaminophen 5-325 mg tab # pills per month: 60 Last UDS date:03/06/2020 Opioid Treatment Agreement signed (Y/N):yes Opioid Treatment Agreement last reviewed with patient: 04/2019 N[facility] reviewed this encounter (include red flags):Yes- no red flags.       Ductal carcinoma in situ (DCIS) of right breast    Has had a follow-up with the surgeon and she is doing well with plan will be to follow-up in 6 months with the surgeon as well as get her updated mammogram for her left breast.         No orders of the defined types were placed in this encounter.   Follow-up: Return in about 2 months (around 05/06/2020) for Chronic Pain Management and BP.    Beatrice Lecher, MD

## 2020-03-06 NOTE — Assessment & Plan Note (Signed)
Pressure looks great today.  She is technically due for labs in about a week or 2 so go ahead and print those off today.

## 2020-03-06 NOTE — Assessment & Plan Note (Signed)
Continue to follow renal function every 6 months. 

## 2020-03-06 NOTE — Assessment & Plan Note (Signed)
Stable.  She has actually quit smoking which is fantastic.

## 2020-03-06 NOTE — Assessment & Plan Note (Signed)
Continue atorvastatin daily.  Due for updated lipids.

## 2020-03-08 LAB — DRUG MONITORING, PANEL 6 WITH CONFIRMATION, URINE
6 Acetylmorphine: NEGATIVE ng/mL (ref <10)
Alcohol Metabolites: POSITIVE ng/mL — AB
Amphetamines: NEGATIVE ng/mL (ref <500)
Barbiturates: NEGATIVE ng/mL (ref <300)
Benzodiazepines: NEGATIVE ng/mL (ref <100)
Cocaine Metabolite: NEGATIVE ng/mL (ref <150)
Codeine: NEGATIVE ng/mL (ref <50)
Creatinine: 129.9 mg/dL
Ethyl Glucuronide (ETG): 2833 ng/mL — ABNORMAL HIGH (ref <500)
Ethyl Sulfate (ETS): 805 ng/mL — ABNORMAL HIGH (ref <100)
Hydrocodone: NEGATIVE ng/mL (ref <50)
Hydromorphone: NEGATIVE ng/mL (ref <50)
Marijuana Metabolite: NEGATIVE ng/mL (ref <20)
Methadone Metabolite: NEGATIVE ng/mL (ref <100)
Morphine: NEGATIVE ng/mL (ref <50)
Norhydrocodone: NEGATIVE ng/mL (ref <50)
Noroxycodone: 1115 ng/mL — ABNORMAL HIGH (ref <50)
Opiates: NEGATIVE ng/mL (ref <100)
Oxidant: NEGATIVE ug/mL
Oxycodone: 824 ng/mL — ABNORMAL HIGH (ref <50)
Oxycodone: POSITIVE ng/mL — AB (ref <100)
Oxymorphone: 59 ng/mL — ABNORMAL HIGH (ref <50)
Phencyclidine: NEGATIVE ng/mL (ref <25)
pH: 5.5 (ref 4.5–9.0)

## 2020-03-08 LAB — DM TEMPLATE

## 2020-03-12 ENCOUNTER — Other Ambulatory Visit: Payer: Self-pay

## 2020-03-12 DIAGNOSIS — G8929 Other chronic pain: Secondary | ICD-10-CM

## 2020-03-12 DIAGNOSIS — M545 Low back pain, unspecified: Secondary | ICD-10-CM

## 2020-03-12 MED ORDER — OXYCODONE-ACETAMINOPHEN 5-325 MG PO TABS: 1.0000 | ORAL_TABLET | Freq: Three times a day (TID) | ORAL | 0 refills | Status: DC | PRN

## 2020-03-12 NOTE — Telephone Encounter (Signed)
Requests refill on Oxycodone.

## 2020-03-17 ENCOUNTER — Encounter: Payer: Self-pay | Admitting: Family Medicine

## 2020-03-17 ENCOUNTER — Ambulatory Visit (INDEPENDENT_AMBULATORY_CARE_PROVIDER_SITE_OTHER): Payer: Medicare Other | Admitting: Family Medicine

## 2020-03-17 VITALS — BP 126/72 | HR 68 | Ht 62.0 in | Wt 164.0 lb

## 2020-03-17 DIAGNOSIS — F1010 Alcohol abuse, uncomplicated: Secondary | ICD-10-CM

## 2020-03-17 DIAGNOSIS — Z79899 Other long term (current) drug therapy: Secondary | ICD-10-CM | POA: Diagnosis not present

## 2020-03-17 DIAGNOSIS — R7309 Other abnormal glucose: Secondary | ICD-10-CM | POA: Diagnosis not present

## 2020-03-17 DIAGNOSIS — N1831 Chronic kidney disease, stage 3a: Secondary | ICD-10-CM | POA: Diagnosis not present

## 2020-03-17 DIAGNOSIS — I10 Essential (primary) hypertension: Secondary | ICD-10-CM | POA: Diagnosis not present

## 2020-03-17 DIAGNOSIS — E782 Mixed hyperlipidemia: Secondary | ICD-10-CM | POA: Diagnosis not present

## 2020-03-17 NOTE — Addendum Note (Signed)
Addended by: Teddy Spike on: 03/17/2020 04:27 PM   Modules accepted: Orders

## 2020-03-17 NOTE — Progress Notes (Signed)
Acute Office Visit  Subjective:    Patient ID: Annette Ramirez, female    DOB: 02-04-1946, 74 y.o.   MRN: [id number]  Chief Complaint  Patient presents with  . Referral    HPI Patient is in today for discuss recent urine drug screen results.  Her urine drug screens have been positive for alcohol each time for the last year.  The last time she was here she had discussed that she had quit drinking.  We did a UDS that day but was positive for alcohol again and she is here today to go over those results.  She says she usually has some wine in the evenings while she is cooking dinner.  She does not usually drink during the day and she says she never drinks to excess to the point of feeling drunk.  Past Medical History:  Diagnosis Date  . Anxiety   . Asthma   . COPD (chronic obstructive pulmonary disease) ([place])   . Depression   . Ductal carcinoma in situ (DCIS) of right breast 08/16/2019  . Exertional shortness of breath   . Family history of anesthesia complication    "granddaughter has PONV" (02/28/2013)  . Fibromyalgia   . GERD (gastroesophageal reflux disease)   . H/O hiatal hernia   . Hypertension   . Migraines    "in the past; they've stopped" (02/28/2013)  . Opiate use 06/06/2017  . Osteoporosis   . PONV (postoperative nausea and vomiting)   . Sinus headache     Past Surgical History:  Procedure Laterality Date  . ABDOMINAL HYSTERECTOMY  1975  . APPENDECTOMY  1975  . BREAST CYST ASPIRATION Left 1980's  . COLONOSCOPY W/ BIOPSIES AND POLYPECTOMY  2012  . DILATION AND CURETTAGE OF UTERUS  1960's  . EXCISIONAL HEMORRHOIDECTOMY  1980's  . LUMBAR LAMINECTOMY  02/28/2013  . LUMBAR LAMINECTOMY/DECOMPRESSION MICRODISCECTOMY N/A 02/28/2013   Procedure: LUMBAR LAMINECTOMY/DECOMPRESSION MICRODISCECTOMY/Lumbar 4-5 decompression;  Surgeon: Sinclair Ship, MD;  Location: [facility];  Service: Orthopedics;  Laterality: N/A;  Lumbar 4-5 decompression  . TONSILLECTOMY  1950  . TUBAL  LIGATION  1972    Family History  Problem Relation Age of Onset  . Alzheimer's disease Mother   . Prostate cancer Other        grandfather  . Stroke Other        grandparent    Social History   Socioeconomic History  . Marital status: Married    Spouse name: [name]  . Number of children: 2  . Years of education: 62  . Highest education level: Some college, no degree  Occupational History  . Occupation: worked for attorney    Comment: retired  Tobacco Use  . Smoking status: Former Smoker    Packs/day: 0.25    Years: 40.00    Pack years: 10.00    Types: Cigarettes    Quit date: 02/01/2020    Years since quitting: 0.1  . Smokeless tobacco: Never Used  . Tobacco comment: 02/28/2013 "started smoking eletronic cigarettes a couple years ago"  Vaping Use  . Vaping Use: Never used  Substance and Sexual Activity  . Alcohol use: Not Currently    Comment: occasionally social  . Drug use: No  . Sexual activity: Not Currently  Other Topics Concern  . 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   Reads a lot during the day. Enjoys shopping.   Caffeine in the morning- 1 cup f coffee  Social Determinants of Health   Financial Resource Strain:   . Difficulty of Paying Living Expenses: Not on file  Food Insecurity:   . Worried About Charity fundraiser in the Last Year: Not on file  . Ran Out of Food in the Last Year: Not on file  Transportation Needs:   . Lack of Transportation (Medical): Not on file  . Lack of Transportation (Non-Medical): Not on file  Physical Activity:   . Days of Exercise per Week: Not on file  . Minutes of Exercise per Session: Not on file  Stress:   . Feeling of Stress : Not on file  Social Connections:   . Frequency of Communication with Friends and Family: Not on file  . Frequency of Social Gatherings with Friends and Family: Not on file  . Attends Religious Services: Not on file  . Active Member of Clubs or Organizations:  Not on file  . Attends Archivist Meetings: Not on file  . Marital Status: Not on file  Intimate Partner Violence:   . Fear of Current or Ex-Partner: Not on file  . Emotionally Abused: Not on file  . Physically Abused: Not on file  . Sexually Abused: Not on file    Outpatient Medications Prior to Visit  Medication Sig Dispense Refill  . albuterol (PROVENTIL HFA;VENTOLIN HFA) 108 (90 Base) MCG/ACT inhaler Inhale 2 puffs into the lungs every 6 (six) hours as needed for wheezing. 54 g 3  . amitriptyline (ELAVIL) 25 MG tablet TAKE 1 TABLET BY MOUTH AT  BEDTIME 90 tablet 1  . amLODipine (NORVASC) 5 MG tablet TAKE ONE TABLET BY MOUTH EVERY DAY 90 tablet 1  . atenolol (TENORMIN) 100 MG tablet Take 1 tablet (100 mg total) by mouth 2 (two) times daily. 2 week supply while waiting on mail order 28 tablet 0  . atorvastatin (LIPITOR) 20 MG tablet TAKE 1 TABLET BY MOUTH  DAILY 90 tablet 3  . Cholecalciferol (VITAMIN D3 PO) Take 1 capsule by mouth daily.    Marland Kitchen FLUoxetine (PROZAC) 40 MG capsule TAKE 1 CAPSULE BY MOUTH  DAILY 90 capsule 3  . gabapentin (NEURONTIN) 100 MG capsule TAKE ONE CAPSULE BY MOUTH  EVERY MORNING, ONE CAPSULE  AT NOON, THEN THREE  CAPSULES AT BEDTIME 450 capsule 3  . loperamide (IMODIUM) 2 MG capsule TAKE ONE CAPSULE BY MOUTH FOUR TIMES A DAY AS NEEDED FOR DIARRHEA.  1  . losartan (COZAAR) 100 MG tablet TAKE 1 TABLET BY MOUTH  DAILY 90 tablet 3  . MAGNESIUM PO Take 1 tablet by mouth daily.    . meclizine (ANTIVERT) 25 MG tablet TAKE 1 TABLET BY MOUTH 3  TIMES DAILY AS NEEDED FOR  DIZZINESS 180 tablet 1  . omeprazole (PRILOSEC) 40 MG capsule TAKE 1 CAPSULE BY MOUTH  DAILY 90 capsule 3  . oxyCODONE-acetaminophen (PERCOCET/ROXICET) 5-325 MG tablet Take 1 tablet by mouth every 8 (eight) hours as needed for severe pain. for pain 60 tablet 0  . QUEtiapine (SEROQUEL) 200 MG tablet TAKE 1 TABLET BY MOUTH AT  BEDTIME 90 tablet 3  . Tiotropium Bromide Monohydrate (SPIRIVA RESPIMAT)  2.5 MCG/ACT AERS Inhale 2 puffs into the lungs every morning. 12 g PRN  . tolterodine (DETROL LA) 2 MG 24 hr capsule Take 1 capsule (2 mg total) by mouth in the morning. 30 capsule 1   No facility-administered medications prior to visit.    Allergies  Allergen Reactions  . Amoxicillin-Pot Clavulanate  REACTION: nausea and abdominal pain  . Aspirin     REACTION: upset stomach  . Codeine     REACTION: nausea  . Hctz [Hydrochlorothiazide] Other (See Comments)    Hyponatremia  . Nsaids Nausea Only  . Other Nausea And Vomiting    Review of Systems     Objective:    Physical Exam  BP 126/72   Pulse 68   Ht 5\' 2"  (1.575 m)   Wt 164 lb (74.4 kg)   BMI 30.00 kg/m  Wt Readings from Last 3 Encounters:  03/17/20 164 lb (74.4 kg)  03/06/20 164 lb (74.4 kg)  01/09/20 161 lb (73 kg)    There are no preventive care reminders to display for this patient.  There are no preventive care reminders to display for this patient.   Lab Results  Component Value Date   TSH 2.42 09/29/2017   Lab Results  Component Value Date   WBC 6.9 08/17/2019   HGB 11.9 08/17/2019   HCT 34.5 (L) 08/17/2019   MCV 96.9 08/17/2019   PLT 245 08/17/2019   Lab Results  Component Value Date   NA 139 08/17/2019   K 3.9 08/17/2019   CO2 27 08/17/2019   GLUCOSE 122 08/17/2019   BUN 16 08/17/2019   CREATININE 1.21 (H) 08/17/2019   BILITOT 0.7 08/17/2019   ALKPHOS 58 12/09/2016   AST 23 08/17/2019   ALT 14 08/17/2019   PROT 6.3 08/17/2019   ALBUMIN 4.5 12/09/2016   CALCIUM 8.8 08/17/2019   Lab Results  Component Value Date   CHOL 177 03/21/2019   Lab Results  Component Value Date   HDL 80 03/21/2019   Lab Results  Component Value Date   LDLCALC 73 03/21/2019   Lab Results  Component Value Date   TRIG 159 (H) 03/21/2019   Lab Results  Component Value Date   CHOLHDL 2.2 03/21/2019   Lab Results  Component Value Date   HGBA1C 5.5 04/19/2019       Assessment & Plan:    Problem List Items Addressed This Visit    None    Visit Diagnoses    Alcohol abuse    -  Primary     Alcohol abuse-did discuss that there are different varying degrees of alcohol use and abuse.  And that there are people who only drink certain times of the day and are still very functional and do not necessarily drink to excess.  But they still have significant difficulty giving up the alcohol.  I offered to refer her for therapy/counseling if she feels like she has some underlying mood issues that trigger her drinking.  Also offered to refer her for substance abuse counseling/therapy if she desired.  We discussed that it is unsafe to mix narcotics with her alcohol and so she will have to make a choice for whether or not she wants to continue to use her pain medication to treat her pain which I do feel like makes a big difference in her functioning versus drinking her alcohol.  We discussed the potential interaction between the drugs and safety issues around that.  She did understand and wanted to repeat a urine drug screen today so that was collected and sent for further evaluation.  She can reach out to me at any point if she feels like she needs any assistance or support in helping to quit use of alcohol.  No orders of the defined types were placed in this encounter.  I spent  25 minutes on the day of the encounter to include pre-visit record review, face-to-face time with the patient and post visit ordering of test.   Beatrice Lecher, MD

## 2020-03-18 LAB — COMPLETE METABOLIC PANEL WITH GFR
AG Ratio: 2.3 (calc) (ref 1.0–2.5)
ALT: 16 U/L (ref 6–29)
AST: 20 U/L (ref 10–35)
Albumin: 4.6 g/dL (ref 3.6–5.1)
Alkaline phosphatase (APISO): 61 U/L (ref 37–153)
BUN/Creatinine Ratio: 19 (calc) (ref 6–22)
BUN: 18 mg/dL (ref 7–25)
CO2: 29 mmol/L (ref 20–32)
Calcium: 9.4 mg/dL (ref 8.6–10.4)
Chloride: 99 mmol/L (ref 98–110)
Creat: 0.94 mg/dL — ABNORMAL HIGH (ref 0.60–0.93)
GFR, Est African American: 68 mL/min/{1.73_m2} (ref >=60)
GFR, Est Non African American: 59 mL/min/{1.73_m2} — ABNORMAL LOW (ref >=60)
Globulin: 2 g/dL (calc) (ref 1.9–3.7)
Glucose, Bld: 98 mg/dL (ref 65–139)
Potassium: 4.6 mmol/L (ref 3.5–5.3)
Sodium: 137 mmol/L (ref 135–146)
Total Bilirubin: 0.5 mg/dL (ref 0.2–1.2)
Total Protein: 6.6 g/dL (ref 6.1–8.1)

## 2020-03-18 LAB — LIPID PANEL
Cholesterol: 136 mg/dL (ref <200)
HDL: 65 mg/dL (ref >=50)
LDL Cholesterol (Calc): 49 mg/dL (calc)
Non-HDL Cholesterol (Calc): 71 mg/dL (calc) (ref <130)
Total CHOL/HDL Ratio: 2.1 (calc) (ref <5.0)
Triglycerides: 135 mg/dL (ref <150)

## 2020-03-18 LAB — CBC
HCT: 35.7 % (ref 35.0–45.0)
Hemoglobin: 12.4 g/dL (ref 11.7–15.5)
MCH: 32.9 pg (ref 27.0–33.0)
MCHC: 34.7 g/dL (ref 32.0–36.0)
MCV: 94.7 fL (ref 80.0–100.0)
MPV: 11.5 fL (ref 7.5–12.5)
Platelets: 233 10*3/uL (ref 140–400)
RBC: 3.77 10*6/uL — ABNORMAL LOW (ref 3.80–5.10)
RDW: 11.2 % (ref 11.0–15.0)
WBC: 6.3 10*3/uL (ref 3.8–10.8)

## 2020-03-18 LAB — HEMOGLOBIN A1C
Hgb A1c MFr Bld: 5.4 % of total Hgb (ref <5.7)
Mean Plasma Glucose: 108 (calc)
eAG (mmol/L): 6 (calc)

## 2020-03-19 LAB — DRUG MONITORING, PANEL 6 WITH CONFIRMATION, URINE
6 Acetylmorphine: NEGATIVE ng/mL (ref <10)
Alcohol Metabolites: NEGATIVE ng/mL
Amphetamines: NEGATIVE ng/mL (ref <500)
Barbiturates: NEGATIVE ng/mL (ref <300)
Benzodiazepines: NEGATIVE ng/mL (ref <100)
Cocaine Metabolite: NEGATIVE ng/mL (ref <150)
Codeine: NEGATIVE ng/mL (ref <50)
Creatinine: 47.4 mg/dL
Hydrocodone: NEGATIVE ng/mL (ref <50)
Hydromorphone: NEGATIVE ng/mL (ref <50)
Marijuana Metabolite: NEGATIVE ng/mL (ref <20)
Methadone Metabolite: NEGATIVE ng/mL (ref <100)
Morphine: NEGATIVE ng/mL (ref <50)
Norhydrocodone: NEGATIVE ng/mL (ref <50)
Noroxycodone: 725 ng/mL — ABNORMAL HIGH (ref <50)
Opiates: NEGATIVE ng/mL (ref <100)
Oxidant: NEGATIVE ug/mL
Oxycodone: 541 ng/mL — ABNORMAL HIGH (ref <50)
Oxycodone: POSITIVE ng/mL — AB (ref <100)
Oxymorphone: NEGATIVE ng/mL (ref <50)
Phencyclidine: NEGATIVE ng/mL (ref <25)
pH: 5.6 (ref 4.5–9.0)

## 2020-03-19 LAB — DM TEMPLATE

## 2020-03-26 DIAGNOSIS — J449 Chronic obstructive pulmonary disease, unspecified: Secondary | ICD-10-CM | POA: Diagnosis not present

## 2020-03-29 ENCOUNTER — Other Ambulatory Visit: Payer: Self-pay | Admitting: Family Medicine

## 2020-04-25 DIAGNOSIS — J449 Chronic obstructive pulmonary disease, unspecified: Secondary | ICD-10-CM | POA: Diagnosis not present

## 2020-05-01 ENCOUNTER — Other Ambulatory Visit: Payer: Self-pay | Admitting: Family Medicine

## 2020-05-01 DIAGNOSIS — G8929 Other chronic pain: Secondary | ICD-10-CM

## 2020-05-01 DIAGNOSIS — M545 Low back pain, unspecified: Secondary | ICD-10-CM

## 2020-05-01 NOTE — Telephone Encounter (Signed)
Patient called and left a voicemail wanting to have her pain meds refilled. Please advise patient

## 2020-05-05 ENCOUNTER — Other Ambulatory Visit: Payer: Self-pay

## 2020-05-05 ENCOUNTER — Other Ambulatory Visit: Payer: Self-pay | Admitting: Family Medicine

## 2020-05-05 DIAGNOSIS — G8929 Other chronic pain: Secondary | ICD-10-CM

## 2020-05-05 DIAGNOSIS — M545 Low back pain, unspecified: Secondary | ICD-10-CM

## 2020-05-05 MED ORDER — OXYCODONE-ACETAMINOPHEN 5-325 MG PO TABS: 1.0000 | ORAL_TABLET | Freq: Three times a day (TID) | ORAL | 0 refills | Status: DC | PRN

## 2020-05-05 NOTE — Addendum Note (Signed)
Addended by: Deno Etienne on: 05/05/2020 12:45 PM   Modules accepted: Orders

## 2020-05-05 NOTE — Telephone Encounter (Signed)
Needs appt. She is suppose to f/u Q2 months.

## 2020-05-06 NOTE — Telephone Encounter (Signed)
Please call pt for f/u for pain management and medication refills. Thank you

## 2020-05-06 NOTE — Telephone Encounter (Signed)
Patient has an appointment already scheduled for 05/22/2020. AM

## 2020-05-22 ENCOUNTER — Ambulatory Visit: Payer: Medicare Other | Admitting: Family Medicine

## 2020-05-26 DIAGNOSIS — J449 Chronic obstructive pulmonary disease, unspecified: Secondary | ICD-10-CM | POA: Diagnosis not present

## 2020-05-29 ENCOUNTER — Telehealth: Payer: Self-pay | Admitting: *Deleted

## 2020-05-29 MED ORDER — CLONAZEPAM 0.5 MG PO TABS: 0.5000 mg | ORAL_TABLET | Freq: Two times a day (BID) | ORAL | 0 refills | Status: DC | PRN

## 2020-05-29 NOTE — Telephone Encounter (Signed)
rx sent clonazepam. Use spariningly

## 2020-05-29 NOTE — Telephone Encounter (Signed)
Pt left a VM stating that her daughter passed away and would like something to take for her nerves.   Will fwd to pcp

## 2020-05-30 NOTE — Telephone Encounter (Signed)
Pt informed

## 2020-06-10 ENCOUNTER — Ambulatory Visit (INDEPENDENT_AMBULATORY_CARE_PROVIDER_SITE_OTHER): Payer: Medicare Other | Admitting: Family Medicine

## 2020-06-10 ENCOUNTER — Other Ambulatory Visit: Payer: Self-pay

## 2020-06-10 ENCOUNTER — Encounter: Payer: Self-pay | Admitting: Family Medicine

## 2020-06-10 VITALS — BP 136/77 | HR 68 | Ht 62.0 in | Wt 169.0 lb

## 2020-06-10 DIAGNOSIS — L659 Nonscarring hair loss, unspecified: Secondary | ICD-10-CM | POA: Diagnosis not present

## 2020-06-10 DIAGNOSIS — J42 Unspecified chronic bronchitis: Secondary | ICD-10-CM

## 2020-06-10 DIAGNOSIS — I10 Essential (primary) hypertension: Secondary | ICD-10-CM

## 2020-06-10 DIAGNOSIS — G8929 Other chronic pain: Secondary | ICD-10-CM

## 2020-06-10 DIAGNOSIS — F4321 Adjustment disorder with depressed mood: Secondary | ICD-10-CM

## 2020-06-10 DIAGNOSIS — M545 Low back pain, unspecified: Secondary | ICD-10-CM | POA: Diagnosis not present

## 2020-06-10 DIAGNOSIS — K591 Functional diarrhea: Secondary | ICD-10-CM

## 2020-06-10 MED ORDER — OXYCODONE-ACETAMINOPHEN 5-325 MG PO TABS: 1.0000 | ORAL_TABLET | Freq: Three times a day (TID) | ORAL | 0 refills | Status: DC | PRN

## 2020-06-10 MED ORDER — FLUOXETINE HCL 40 MG PO CAPS: 40.0000 mg | ORAL_CAPSULE | Freq: Every day | ORAL | 1 refills | Status: DC

## 2020-06-10 MED ORDER — AMLODIPINE BESYLATE 5 MG PO TABS
5.0000 mg | ORAL_TABLET | Freq: Every day | ORAL | 1 refills | Status: DC
Start: 2020-06-10 — End: 2021-01-26

## 2020-06-10 MED ORDER — ATORVASTATIN CALCIUM 20 MG PO TABS
20.0000 mg | ORAL_TABLET | Freq: Every day | ORAL | 3 refills | Status: DC
Start: 2020-06-10 — End: 2021-01-16

## 2020-06-10 NOTE — Assessment & Plan Note (Signed)
Well controlled. Continue current regimen. Follow up in  2 months.

## 2020-06-10 NOTE — Assessment & Plan Note (Signed)
Not had any recent flares or exacerbations but has noticed she has been a little bit more short of breath this week she does not know any specific triggers but says she will keep an eye on it if it gets worse she will let us know.

## 2020-06-10 NOTE — Assessment & Plan Note (Signed)
Indication for chronic opioid:chronic back pain, neuropathy, and fibromyalgia Medication and dose:oxycodone-acetaminophen 5-325 mg tab # pills per month: 60 Last UDS date:06/10/2020 Opioid Treatment Agreement signed (Y/N):yes Opioid Treatment Agreement last reviewed with patient:06/2020 [facility] reviewed this encounter (include red flags):Yes- no red flags.

## 2020-06-10 NOTE — Assessment & Plan Note (Signed)
Encouraged her ot consider therapy or counseling.  She says she might look into it with [facility].

## 2020-06-10 NOTE — Progress Notes (Signed)
Established Patient Office Visit  Subjective:  Patient ID: Annette Ramirez, female    DOB: [birth year]  Age: 75 y.o. MRN: [id number]  CC:  Chief Complaint  Patient presents with  . Pain Management    HPI [name] presents for   Chronic pain management.  She is doing well on her oxycodone her chronic neck and back pain.  She is here today for follow-up.  Her daughter passed away recently suddenly.  She had had a fall in the middle the night and actually fractured her ankle she went to the ED and it was set and she was supposed to be scheduled for surgery later the next week and she died suddenly in the middle the night.  They still have not heard back yet about the autopsy report but is just been stressful she and her husband have been trying to start cleaning out her house which she was renting.  She had 3 sons.    Past Medical History:  Diagnosis Date  . Anxiety   . Asthma   . COPD (chronic obstructive pulmonary disease) ([place])   . Depression   . Ductal carcinoma in situ (DCIS) of right breast 08/16/2019  . Exertional shortness of breath   . Family history of anesthesia complication    "granddaughter has PONV" (02/28/2013)  . Fibromyalgia   . GERD (gastroesophageal reflux disease)   . H/O hiatal hernia   . Hypertension   . Migraines    "in the past; they've stopped" (02/28/2013)  . Opiate use 06/06/2017  . Osteoporosis   . PONV (postoperative nausea and vomiting)   . Sinus headache     Past Surgical History:  Procedure Laterality Date  . ABDOMINAL HYSTERECTOMY  1975  . APPENDECTOMY  1975  . BREAST CYST ASPIRATION Left 1980's  . COLONOSCOPY W/ BIOPSIES AND POLYPECTOMY  2012  . DILATION AND CURETTAGE OF UTERUS  1960's  . EXCISIONAL HEMORRHOIDECTOMY  1980's  . LUMBAR LAMINECTOMY  02/28/2013  . LUMBAR LAMINECTOMY/DECOMPRESSION MICRODISCECTOMY N/A 02/28/2013   Procedure: LUMBAR LAMINECTOMY/DECOMPRESSION MICRODISCECTOMY/Lumbar 4-5 decompression;  Surgeon: Sinclair Ship, MD;  Location: [facility];  Service: Orthopedics;  Laterality: N/A;  Lumbar 4-5 decompression  . TONSILLECTOMY  1950  . TUBAL LIGATION  1972    Family History  Problem Relation Age of Onset  . Alzheimer's disease Mother   . Prostate cancer Other        grandfather  . Stroke Other        grandparent    Social History   Socioeconomic History  . Marital status: Married    Spouse name: [name]  . Number of children: 2  . Years of education: 73  . Highest education level: Some college, no degree  Occupational History  . Occupation: worked for attorney    Comment: retired  Tobacco Use  . Smoking status: Former Smoker    Packs/day: 0.25    Years: 40.00    Pack years: 10.00    Types: Cigarettes    Quit date: 02/01/2020    Years since quitting: 0.3  . Smokeless tobacco: Never Used  . Tobacco comment: 02/28/2013 "started smoking eletronic cigarettes a couple years ago"  Vaping Use  . Vaping Use: Never used  Substance and Sexual Activity  . Alcohol use: Not Currently    Comment: occasionally social  . Drug use: No  . Sexual activity: Not Currently  Other Topics Concern  . Not on file  Social History Narrative   On  committees at her church.   Reads a lot during the day. Enjoys shopping.   Caffeine in the morning- 1 cup f coffee   Social Determinants of Health   Financial Resource Strain: Not on file  Food Insecurity: Not on file  Transportation Needs: Not on file  Physical Activity: Not on file  Stress: Not on file  Social Connections: Not on file  Intimate Partner Violence: Not on file    Outpatient Medications Prior to Visit  Medication Sig Dispense Refill  . albuterol (PROVENTIL HFA;VENTOLIN HFA) 108 (90 Base) MCG/ACT inhaler Inhale 2 puffs into the lungs every 6 (six) hours as needed for wheezing. 54 g 3  . amitriptyline (ELAVIL) 25 MG tablet TAKE 1 TABLET BY MOUTH AT  BEDTIME 90 tablet 1  . Cholecalciferol (VITAMIN D3 PO) Take 1 capsule by mouth daily.     Marland Kitchen gabapentin (NEURONTIN) 100 MG capsule TAKE ONE CAPSULE BY MOUTH  EVERY MORNING, ONE CAPSULE  AT NOON, THEN THREE  CAPSULES AT BEDTIME 450 capsule 3  . losartan (COZAAR) 100 MG tablet TAKE 1 TABLET BY MOUTH  DAILY 90 tablet 3  . MAGNESIUM PO Take 1 tablet by mouth daily.    Marland Kitchen omeprazole (PRILOSEC) 40 MG capsule TAKE 1 CAPSULE BY MOUTH  DAILY 90 capsule 3  . QUEtiapine (SEROQUEL) 200 MG tablet TAKE 1 TABLET BY MOUTH AT  BEDTIME 90 tablet 3  . amLODipine (NORVASC) 5 MG tablet TAKE ONE TABLET BY MOUTH EVERY DAY 90 tablet 1  . atenolol (TENORMIN) 100 MG tablet Take 1 tablet (100 mg total) by mouth 2 (two) times daily. 2 week supply while waiting on mail order 28 tablet 0  . atorvastatin (LIPITOR) 20 MG tablet TAKE 1 TABLET BY MOUTH  DAILY 90 tablet 3  . clonazePAM (KLONOPIN) 0.5 MG tablet Take 1 tablet (0.5 mg total) by mouth 2 (two) times daily as needed for anxiety. 15 tablet 0  . FLUoxetine (PROZAC) 40 MG capsule TAKE 1 CAPSULE BY MOUTH  DAILY 90 capsule 3  . loperamide (IMODIUM) 2 MG capsule TAKE ONE CAPSULE BY MOUTH FOUR TIMES A DAY AS NEEDED FOR DIARRHEA.  1  . meclizine (ANTIVERT) 25 MG tablet TAKE 1 TABLET BY MOUTH 3  TIMES DAILY AS NEEDED FOR  DIZZINESS 180 tablet 1  . oxyCODONE-acetaminophen (PERCOCET/ROXICET) 5-325 MG tablet Take 1 tablet by mouth every 8 (eight) hours as needed for severe pain. for pain 40 tablet 0  . Tiotropium Bromide Monohydrate (SPIRIVA RESPIMAT) 2.5 MCG/ACT AERS Inhale 2 puffs into the lungs every morning. 12 g PRN  . tolterodine (DETROL LA) 2 MG 24 hr capsule Take 1 capsule (2 mg total) by mouth in the morning. 30 capsule 1   No facility-administered medications prior to visit.    Allergies  Allergen Reactions  . Amoxicillin-Pot Clavulanate     REACTION: nausea and abdominal pain  . Aspirin     REACTION: upset stomach  . Codeine     REACTION: nausea  . Hctz [Hydrochlorothiazide] Other (See Comments)    Hyponatremia  . Nsaids Nausea Only  . Other  Nausea And Vomiting    ROS Review of Systems    Objective:    Physical Exam  BP 136/77   Pulse 68   Ht 5\' 2"  (1.575 m)   Wt 169 lb (76.7 kg)   SpO2 95%   BMI 30.91 kg/m  Wt Readings from Last 3 Encounters:  06/10/20 169 lb (76.7 kg)  03/17/20 164 lb (74.4 kg)  03/06/20 164 lb (74.4 kg)     Health Maintenance Due  Topic Date Due  . COVID-19 Vaccine (3 - Pfizer risk 4-dose series) 08/13/2019    There are no preventive care reminders to display for this patient.  Lab Results  Component Value Date   TSH 2.42 09/29/2017   Lab Results  Component Value Date   WBC 6.3 03/17/2020   HGB 12.4 03/17/2020   HCT 35.7 03/17/2020   MCV 94.7 03/17/2020   PLT 233 03/17/2020   Lab Results  Component Value Date   NA 137 03/17/2020   K 4.6 03/17/2020   CO2 29 03/17/2020   GLUCOSE 98 03/17/2020   BUN 18 03/17/2020   CREATININE 0.94 (H) 03/17/2020   BILITOT 0.5 03/17/2020   ALKPHOS 58 12/09/2016   AST 20 03/17/2020   ALT 16 03/17/2020   PROT 6.6 03/17/2020   ALBUMIN 4.5 12/09/2016   CALCIUM 9.4 03/17/2020   Lab Results  Component Value Date   CHOL 136 03/17/2020   Lab Results  Component Value Date   HDL 65 03/17/2020   Lab Results  Component Value Date   LDLCALC 49 03/17/2020   Lab Results  Component Value Date   TRIG 135 03/17/2020   Lab Results  Component Value Date   CHOLHDL 2.1 03/17/2020   Lab Results  Component Value Date   HGBA1C 5.4 03/17/2020      Assessment & Plan:   Problem List Items Addressed This Visit      Cardiovascular and Mediastinum   HYPERTENSION, BENIGN ESSENTIAL    Well controlled. Continue current regimen. Follow up in  2 months.       Relevant Medications   atorvastatin (LIPITOR) 20 MG tablet   amLODipine (NORVASC) 5 MG tablet     Respiratory   COPD (chronic obstructive pulmonary disease) ([facility])    Not had any recent flares or exacerbations but has noticed she has been a little bit more short of breath this week she  does not know any specific triggers but says she will keep an eye on it if it gets worse she will let us know.        Other   Grief    Encouraged her ot consider therapy or counseling.  She says she might look into it with [facility].        Encounter for chronic pain management - Primary    Indication for chronic opioid:chronic back pain, neuropathy, and fibromyalgia Medication and dose:oxycodone-acetaminophen 5-325 mg tab # pills per month: 60 Last UDS date:06/10/2020 Opioid Treatment Agreement signed (Y/N):yes Opioid Treatment Agreement last reviewed with patient:06/2020 [facility] reviewed this encounter (include red flags):Yes- no red flags.      Relevant Medications   oxyCODONE-acetaminophen (PERCOCET/ROXICET) 5-325 MG tablet   Other Relevant Orders   DRUG MONITORING, PANEL 6 WITH CONFIRMATION, URINE   Chronic lower back pain   Relevant Medications   oxyCODONE-acetaminophen (PERCOCET/ROXICET) 5-325 MG tablet    Other Visit Diagnoses    Hair loss       Relevant Medications   FLUoxetine (PROZAC) 40 MG capsule   Functional diarrhea       Relevant Medications   FLUoxetine (PROZAC) 40 MG capsule      Meds ordered this encounter  Medications  . oxyCODONE-acetaminophen (PERCOCET/ROXICET) 5-325 MG tablet    Sig: Take 1 tablet by mouth every 8 (eight) hours as needed for severe pain. for pain    Dispense:  60 tablet    Refill:  0  . atorvastatin (LIPITOR) 20 MG tablet    Sig: Take 1 tablet (20 mg total) by mouth daily.    Dispense:  90 tablet    Refill:  3    Requesting 1 year supply  . FLUoxetine (PROZAC) 40 MG capsule    Sig: Take 1 capsule (40 mg total) by mouth daily.    Dispense:  90 capsule    Refill:  1    Requesting 1 year supply  . amLODipine (NORVASC) 5 MG tablet    Sig: Take 1 tablet (5 mg total) by mouth daily.    Dispense:  90 tablet    Refill:  1    This prescription was filled on 08/01/2018. Any refills authorized will be placed on file.     Follow-up: Return in about 2 months (around 08/08/2020) for pain management and bp.    I spent 35 minutes on the day of the encounter to include pre-visit record review, face-to-face time with the patient and post visit ordering of test.   Beatrice Lecher, MD

## 2020-06-12 LAB — DRUG MONITORING, PANEL 6 WITH CONFIRMATION, URINE
6 Acetylmorphine: NEGATIVE ng/mL (ref <10)
Alcohol Metabolites: NEGATIVE ng/mL
Amphetamines: NEGATIVE ng/mL (ref <500)
Barbiturates: NEGATIVE ng/mL (ref <300)
Benzodiazepines: NEGATIVE ng/mL (ref <100)
Cocaine Metabolite: NEGATIVE ng/mL (ref <150)
Codeine: NEGATIVE ng/mL (ref <50)
Creatinine: 84.6 mg/dL
Hydrocodone: NEGATIVE ng/mL (ref <50)
Hydromorphone: NEGATIVE ng/mL (ref <50)
Marijuana Metabolite: NEGATIVE ng/mL (ref <20)
Methadone Metabolite: NEGATIVE ng/mL (ref <100)
Morphine: NEGATIVE ng/mL (ref <50)
Norhydrocodone: NEGATIVE ng/mL (ref <50)
Noroxycodone: 1017 ng/mL — ABNORMAL HIGH (ref <50)
Opiates: NEGATIVE ng/mL (ref <100)
Oxidant: NEGATIVE ug/mL
Oxycodone: 460 ng/mL — ABNORMAL HIGH (ref <50)
Oxycodone: POSITIVE ng/mL — AB (ref <100)
Oxymorphone: NEGATIVE ng/mL (ref <50)
Phencyclidine: NEGATIVE ng/mL (ref <25)
pH: 5.4 (ref 4.5–9.0)

## 2020-06-12 LAB — DM TEMPLATE

## 2020-06-26 DIAGNOSIS — J449 Chronic obstructive pulmonary disease, unspecified: Secondary | ICD-10-CM | POA: Diagnosis not present

## 2020-07-04 DIAGNOSIS — F17200 Nicotine dependence, unspecified, uncomplicated: Secondary | ICD-10-CM | POA: Diagnosis not present

## 2020-07-04 DIAGNOSIS — D7589 Other specified diseases of blood and blood-forming organs: Secondary | ICD-10-CM | POA: Diagnosis not present

## 2020-07-04 DIAGNOSIS — M810 Age-related osteoporosis without current pathological fracture: Secondary | ICD-10-CM | POA: Diagnosis not present

## 2020-07-04 DIAGNOSIS — D0511 Intraductal carcinoma in situ of right breast: Secondary | ICD-10-CM | POA: Diagnosis not present

## 2020-07-04 DIAGNOSIS — J438 Other emphysema: Secondary | ICD-10-CM | POA: Diagnosis not present

## 2020-07-04 DIAGNOSIS — D649 Anemia, unspecified: Secondary | ICD-10-CM | POA: Diagnosis not present

## 2020-07-24 DIAGNOSIS — J449 Chronic obstructive pulmonary disease, unspecified: Secondary | ICD-10-CM | POA: Diagnosis not present

## 2020-07-31 ENCOUNTER — Other Ambulatory Visit: Payer: Self-pay

## 2020-07-31 DIAGNOSIS — G8929 Other chronic pain: Secondary | ICD-10-CM

## 2020-07-31 DIAGNOSIS — M545 Low back pain, unspecified: Secondary | ICD-10-CM

## 2020-07-31 MED ORDER — OXYCODONE-ACETAMINOPHEN 5-325 MG PO TABS: 1.0000 | ORAL_TABLET | Freq: Three times a day (TID) | ORAL | 0 refills | Status: DC | PRN

## 2020-08-04 DIAGNOSIS — R922 Inconclusive mammogram: Secondary | ICD-10-CM | POA: Diagnosis not present

## 2020-08-04 DIAGNOSIS — Z86000 Personal history of in-situ neoplasm of breast: Secondary | ICD-10-CM | POA: Diagnosis not present

## 2020-08-04 DIAGNOSIS — N6489 Other specified disorders of breast: Secondary | ICD-10-CM | POA: Diagnosis not present

## 2020-08-04 DIAGNOSIS — Z9011 Acquired absence of right breast and nipple: Secondary | ICD-10-CM | POA: Diagnosis not present

## 2020-08-04 DIAGNOSIS — N6459 Other signs and symptoms in breast: Secondary | ICD-10-CM | POA: Diagnosis not present

## 2020-08-22 ENCOUNTER — Ambulatory Visit (INDEPENDENT_AMBULATORY_CARE_PROVIDER_SITE_OTHER): Payer: Medicare Other | Admitting: Family Medicine

## 2020-08-22 ENCOUNTER — Other Ambulatory Visit: Payer: Self-pay

## 2020-08-22 ENCOUNTER — Encounter: Payer: Self-pay | Admitting: Family Medicine

## 2020-08-22 VITALS — BP 115/54 | HR 68 | Ht 62.0 in | Wt 168.0 lb

## 2020-08-22 DIAGNOSIS — N3281 Overactive bladder: Secondary | ICD-10-CM

## 2020-08-22 DIAGNOSIS — M545 Low back pain, unspecified: Secondary | ICD-10-CM

## 2020-08-22 DIAGNOSIS — G8929 Other chronic pain: Secondary | ICD-10-CM

## 2020-08-22 DIAGNOSIS — R3912 Poor urinary stream: Secondary | ICD-10-CM

## 2020-08-22 DIAGNOSIS — R32 Unspecified urinary incontinence: Secondary | ICD-10-CM

## 2020-08-22 DIAGNOSIS — F4321 Adjustment disorder with depressed mood: Secondary | ICD-10-CM

## 2020-08-22 LAB — POCT URINALYSIS DIP (CLINITEK)
Bilirubin, UA: NEGATIVE
Blood, UA: NEGATIVE
Glucose, UA: NEGATIVE mg/dL
Ketones, POC UA: NEGATIVE mg/dL
Nitrite, UA: NEGATIVE
POC PROTEIN,UA: NEGATIVE
Spec Grav, UA: 1.01 (ref 1.010–1.025)
Urobilinogen, UA: 0.2 E.U./dL
pH, UA: 5.5 (ref 5.0–8.0)

## 2020-08-22 MED ORDER — OXYCODONE-ACETAMINOPHEN 5-325 MG PO TABS: 1.0000 | ORAL_TABLET | Freq: Three times a day (TID) | ORAL | 0 refills | Status: DC | PRN

## 2020-08-22 MED ORDER — OXYBUTYNIN CHLORIDE ER 10 MG PO TB24
10.0000 mg | ORAL_TABLET | Freq: Every day | ORAL | 1 refills | Status: DC
Start: 2020-08-22 — End: 2020-10-28

## 2020-08-22 NOTE — Assessment & Plan Note (Signed)
We will try Ditropan as it looks like it is better covered on her insurance.  New prescription sent to pharmacy for her to at least try though I discussed with her that it may not completely alleviate her symptoms especially the weak stream.  In fact I think she would benefit from neurology referral.  She could have a urethral stricture or other obstruction.

## 2020-08-22 NOTE — Assessment & Plan Note (Signed)
Indication for chronic opioid:chronic back pain, neuropathy, and fibromyalgia Medication and dose:oxycodone-acetaminophen 5-325 mg tab # pills per month: 60 Last UDS date:06/10/2020 Opioid Treatment Agreement signed (Y/N):yes Opioid Treatment Agreement last reviewed with patient:06/2020 [facility] reviewed this encounter (include red flags):Yes- no red flags. F/U : 2 months.

## 2020-08-22 NOTE — Progress Notes (Signed)
Established Patient Office Visit  Subjective:  Patient ID: Annette Ramirez, female    DOB: 08-04-1945  Age: 75 y.o. MRN: [id number]  CC:  Chief Complaint  Patient presents with  . Follow-up    HPI Annette Ramirez presents for follow-up chronic pain management.  She is doing okay overall.  She is taking her medication regularly and feels like it really is helpful she says about 30 minutes after she takes her morning dose of pain medication she feels like she can get up and get started for the day.  She denies any significant side effects.  Says she still grieving from the recent loss of her daughter they are still going through her home and things and that has been really difficult she feels like once they get through that that will actually help her.  She is not currently engaged in any grief counseling.  He also wanted to discuss some urinary symptoms.  For quite some time she is been having some difficulty with urinary leaking.  She says sometimes it can happen even right after she is gone to the bathroom she will think she is done she will get up she will leave and then she will actually leak.  She is wearing a pad daily.  And she says now she is noticed to that when she tries to urinate it is mostly a trickle she is lost the force of stream..  No blood in the urine.  No dysuria.  She had tried Detrol in the past 2 mg and says she would like to try higher dose if at all possible.  Past Medical History:  Diagnosis Date  . Anxiety   . Asthma   . COPD (chronic obstructive pulmonary disease) ([place])   . Depression   . Ductal carcinoma in situ (DCIS) of right breast 08/16/2019  . Exertional shortness of breath   . Family history of anesthesia complication    "granddaughter has PONV" (02/28/2013)  . Fibromyalgia   . GERD (gastroesophageal reflux disease)   . H/O hiatal hernia   . Hypertension   . Migraines    "in the past; they've stopped" (02/28/2013)  . Opiate use 06/06/2017  .  Osteoporosis   . PONV (postoperative nausea and vomiting)   . Sinus headache     Past Surgical History:  Procedure Laterality Date  . ABDOMINAL HYSTERECTOMY  1975  . APPENDECTOMY  1975  . BREAST CYST ASPIRATION Left 1980's  . COLONOSCOPY W/ BIOPSIES AND POLYPECTOMY  2012  . DILATION AND CURETTAGE OF UTERUS  1960's  . EXCISIONAL HEMORRHOIDECTOMY  1980's  . LUMBAR LAMINECTOMY  02/28/2013  . LUMBAR LAMINECTOMY/DECOMPRESSION MICRODISCECTOMY N/A 02/28/2013   Procedure: LUMBAR LAMINECTOMY/DECOMPRESSION MICRODISCECTOMY/Lumbar 4-5 decompression;  Surgeon: Sinclair Ship, MD;  Location: [facility];  Service: Orthopedics;  Laterality: N/A;  Lumbar 4-5 decompression  . TONSILLECTOMY  1950  . TUBAL LIGATION  1972    Family History  Problem Relation Age of Onset  . Alzheimer's disease Mother   . Prostate cancer Other        grandfather  . Stroke Other        grandparent    Social History   Socioeconomic History  . Marital status: Married    Spouse name: [name]  . Number of children: 2  . Years of education: 43  . Highest education level: Some college, no degree  Occupational History  . Occupation: worked for attorney    Comment: retired  Tobacco Use  .  Smoking status: Former Smoker    Packs/day: 0.25    Years: 40.00    Pack years: 10.00    Types: Cigarettes    Quit date: 02/01/2020    Years since quitting: 0.5  . Smokeless tobacco: Never Used  . Tobacco comment: 02/28/2013 "started smoking eletronic cigarettes a couple years ago"  Vaping Use  . Vaping Use: Never used  Substance and Sexual Activity  . Alcohol use: Not Currently    Comment: occasionally social  . Drug use: No  . Sexual activity: Not Currently  Other Topics Concern  . 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   Reads a lot during the day. Enjoys shopping.   Caffeine in the morning- 1 cup f coffee   Social Determinants of Health   Financial Resource Strain: Not on file  Food  Insecurity: Not on file  Transportation Needs: Not on file  Physical Activity: Not on file  Stress: Not on file  Social Connections: Not on file  Intimate Partner Violence: Not on file    Outpatient Medications Prior to Visit  Medication Sig Dispense Refill  . albuterol (PROVENTIL HFA;VENTOLIN HFA) 108 (90 Base) MCG/ACT inhaler Inhale 2 puffs into the lungs every 6 (six) hours as needed for wheezing. 54 g 3  . amitriptyline (ELAVIL) 25 MG tablet TAKE 1 TABLET BY MOUTH AT  BEDTIME 90 tablet 1  . amLODipine (NORVASC) 5 MG tablet Take 1 tablet (5 mg total) by mouth daily. 90 tablet 1  . atorvastatin (LIPITOR) 20 MG tablet Take 1 tablet (20 mg total) by mouth daily. 90 tablet 3  . Cholecalciferol (VITAMIN D3 PO) Take 1 capsule by mouth daily.    Marland Kitchen FLUoxetine (PROZAC) 40 MG capsule Take 1 capsule (40 mg total) by mouth daily. 90 capsule 1  . gabapentin (NEURONTIN) 100 MG capsule TAKE ONE CAPSULE BY MOUTH  EVERY MORNING, ONE CAPSULE  AT NOON, THEN THREE  CAPSULES AT BEDTIME 450 capsule 3  . losartan (COZAAR) 100 MG tablet TAKE 1 TABLET BY MOUTH  DAILY 90 tablet 3  . MAGNESIUM PO Take 1 tablet by mouth daily.    Marland Kitchen omeprazole (PRILOSEC) 40 MG capsule TAKE 1 CAPSULE BY MOUTH  DAILY 90 capsule 3  . QUEtiapine (SEROQUEL) 200 MG tablet TAKE 1 TABLET BY MOUTH AT  BEDTIME 90 tablet 3  . oxyCODONE-acetaminophen (PERCOCET/ROXICET) 5-325 MG tablet Take 1 tablet by mouth every 8 (eight) hours as needed for severe pain. for pain 60 tablet 0   No facility-administered medications prior to visit.    Allergies  Allergen Reactions  . Amoxicillin-Pot Clavulanate     REACTION: nausea and abdominal pain  . Aspirin     REACTION: upset stomach  . Codeine     REACTION: nausea  . Hctz [Hydrochlorothiazide] Other (See Comments)    Hyponatremia  . Nsaids Nausea Only  . Other Nausea And Vomiting    ROS Review of Systems    Objective:    Physical Exam Constitutional:      Appearance: She is  well-developed.  HENT:     Head: Normocephalic and atraumatic.  Cardiovascular:     Rate and Rhythm: Normal rate and regular rhythm.     Heart sounds: Normal heart sounds.  Pulmonary:     Effort: Pulmonary effort is normal.     Breath sounds: Normal breath sounds.  Skin:    General: Skin is warm and dry.  Neurological:  Mental Status: She is alert and oriented to person, place, and time.  Psychiatric:        Behavior: Behavior normal.     BP (!) 115/54   Pulse 68   Ht 5\' 2"  (1.575 m)   Wt 168 lb (76.2 kg)   SpO2 98%   BMI 30.73 kg/m  Wt Readings from Last 3 Encounters:  08/22/20 168 lb (76.2 kg)  06/10/20 169 lb (76.7 kg)  03/17/20 164 lb (74.4 kg)     Health Maintenance Due  Topic Date Due  . COVID-19 Vaccine (3 - Pfizer risk 4-dose series) 08/13/2019  . MAMMOGRAM  08/08/2020    There are no preventive care reminders to display for this patient.  Lab Results  Component Value Date   TSH 2.42 09/29/2017   Lab Results  Component Value Date   WBC 6.3 03/17/2020   HGB 12.4 03/17/2020   HCT 35.7 03/17/2020   MCV 94.7 03/17/2020   PLT 233 03/17/2020   Lab Results  Component Value Date   NA 137 03/17/2020   K 4.6 03/17/2020   CO2 29 03/17/2020   GLUCOSE 98 03/17/2020   BUN 18 03/17/2020   CREATININE 0.94 (H) 03/17/2020   BILITOT 0.5 03/17/2020   ALKPHOS 58 12/09/2016   AST 20 03/17/2020   ALT 16 03/17/2020   PROT 6.6 03/17/2020   ALBUMIN 4.5 12/09/2016   CALCIUM 9.4 03/17/2020   Lab Results  Component Value Date   CHOL 136 03/17/2020   Lab Results  Component Value Date   HDL 65 03/17/2020   Lab Results  Component Value Date   LDLCALC 49 03/17/2020   Lab Results  Component Value Date   TRIG 135 03/17/2020   Lab Results  Component Value Date   CHOLHDL 2.1 03/17/2020   Lab Results  Component Value Date   HGBA1C 5.4 03/17/2020      Assessment & Plan:   Problem List Items Addressed This Visit      Genitourinary   OAB  (overactive bladder)    We will try Ditropan as it looks like it is better covered on her insurance.  New prescription sent to pharmacy for her to at least try though I discussed with her that it may not completely alleviate her symptoms especially the weak stream.  In fact I think she would benefit from neurology referral.  She could have a urethral stricture or other obstruction.      Relevant Medications   oxybutynin (DITROPAN-XL) 10 MG 24 hr tablet   Other Relevant Orders   Ambulatory referral to Urology   POCT URINALYSIS DIP (CLINITEK) (Completed)     Other   Weak urinary stream    Lan to refer to urology.  See note above.      Relevant Orders   Ambulatory referral to Urology   POCT URINALYSIS DIP (CLINITEK) (Completed)   Grief    I think overall she is coping as best that she can.  Did offer to refer for grief counseling if at any point she feels like she is struggling and would benefit.      Encounter for chronic pain management - Primary    Indication for chronic opioid:chronic back pain, neuropathy, and fibromyalgia Medication and dose:oxycodone-acetaminophen 5-325 mg tab # pills per month: 60 Last UDS date:06/10/2020 Opioid Treatment Agreement signed (Y/N):yes Opioid Treatment Agreement last reviewed with patient:06/2020 [facility] reviewed this encounter (include red flags):Yes- no red flags. F/U : 2 months.  Relevant Medications   oxyCODONE-acetaminophen (PERCOCET/ROXICET) 5-325 MG tablet (Start on 08/30/2020)   Chronic lower back pain   Relevant Medications   oxyCODONE-acetaminophen (PERCOCET/ROXICET) 5-325 MG tablet (Start on 08/30/2020)    Other Visit Diagnoses    Urinary incontinence, unspecified type       Relevant Medications   oxybutynin (DITROPAN-XL) 10 MG 24 hr tablet   Other Relevant Orders   Ambulatory referral to Urology      Meds ordered this encounter  Medications  . oxybutynin (DITROPAN-XL) 10 MG 24 hr tablet    Sig: Take 1 tablet  (10 mg total) by mouth at bedtime.    Dispense:  90 tablet    Refill:  1  . oxyCODONE-acetaminophen (PERCOCET/ROXICET) 5-325 MG tablet    Sig: Take 1 tablet by mouth every 8 (eight) hours as needed for severe pain. for pain    Dispense:  60 tablet    Refill:  0    Follow-up: Return in about 2 months (around 10/22/2020) for Pain medication .    Beatrice Lecher, MD

## 2020-08-22 NOTE — Assessment & Plan Note (Signed)
I think overall she is coping as best that she can.  Did offer to refer for grief counseling if at any point she feels like she is struggling and would benefit.

## 2020-08-22 NOTE — Assessment & Plan Note (Signed)
Lan to refer to urology.  See note above.

## 2020-08-24 DIAGNOSIS — J449 Chronic obstructive pulmonary disease, unspecified: Secondary | ICD-10-CM | POA: Diagnosis not present

## 2020-09-09 DIAGNOSIS — Z87891 Personal history of nicotine dependence: Secondary | ICD-10-CM | POA: Diagnosis not present

## 2020-09-09 DIAGNOSIS — F17210 Nicotine dependence, cigarettes, uncomplicated: Secondary | ICD-10-CM | POA: Diagnosis not present

## 2020-09-11 ENCOUNTER — Other Ambulatory Visit: Payer: Self-pay | Admitting: Family Medicine

## 2020-09-11 DIAGNOSIS — K219 Gastro-esophageal reflux disease without esophagitis: Secondary | ICD-10-CM

## 2020-09-23 DIAGNOSIS — J449 Chronic obstructive pulmonary disease, unspecified: Secondary | ICD-10-CM | POA: Diagnosis not present

## 2020-10-03 DIAGNOSIS — Z9011 Acquired absence of right breast and nipple: Secondary | ICD-10-CM | POA: Diagnosis not present

## 2020-10-07 ENCOUNTER — Other Ambulatory Visit: Payer: Self-pay | Admitting: Family Medicine

## 2020-10-16 ENCOUNTER — Other Ambulatory Visit: Payer: Self-pay | Admitting: Family Medicine

## 2020-10-23 ENCOUNTER — Other Ambulatory Visit: Payer: Self-pay

## 2020-10-23 DIAGNOSIS — G8929 Other chronic pain: Secondary | ICD-10-CM

## 2020-10-23 DIAGNOSIS — M545 Low back pain, unspecified: Secondary | ICD-10-CM

## 2020-10-23 MED ORDER — OXYCODONE-ACETAMINOPHEN 5-325 MG PO TABS: 1.0000 | ORAL_TABLET | Freq: Three times a day (TID) | ORAL | 0 refills | Status: DC | PRN

## 2020-10-23 NOTE — Progress Notes (Signed)
PDMP reviewed.  RX renewed.

## 2020-10-23 NOTE — Progress Notes (Signed)
Pt called and asked for a refill on her oxycodone. Pt has f/u appt with PCP on 10/28/20.

## 2020-10-24 DIAGNOSIS — J449 Chronic obstructive pulmonary disease, unspecified: Secondary | ICD-10-CM | POA: Diagnosis not present

## 2020-10-27 DIAGNOSIS — F17211 Nicotine dependence, cigarettes, in remission: Secondary | ICD-10-CM | POA: Diagnosis not present

## 2020-10-27 DIAGNOSIS — J449 Chronic obstructive pulmonary disease, unspecified: Secondary | ICD-10-CM | POA: Diagnosis not present

## 2020-10-27 DIAGNOSIS — R0902 Hypoxemia: Secondary | ICD-10-CM | POA: Diagnosis not present

## 2020-10-27 DIAGNOSIS — R918 Other nonspecific abnormal finding of lung field: Secondary | ICD-10-CM | POA: Diagnosis not present

## 2020-10-27 DIAGNOSIS — Z122 Encounter for screening for malignant neoplasm of respiratory organs: Secondary | ICD-10-CM | POA: Diagnosis not present

## 2020-10-27 DIAGNOSIS — R0600 Dyspnea, unspecified: Secondary | ICD-10-CM | POA: Diagnosis not present

## 2020-10-28 ENCOUNTER — Encounter: Payer: Self-pay | Admitting: Family Medicine

## 2020-10-28 ENCOUNTER — Telehealth (INDEPENDENT_AMBULATORY_CARE_PROVIDER_SITE_OTHER): Payer: Medicare Other | Admitting: Family Medicine

## 2020-10-28 VITALS — BP 130/82

## 2020-10-28 DIAGNOSIS — G8929 Other chronic pain: Secondary | ICD-10-CM | POA: Diagnosis not present

## 2020-10-28 DIAGNOSIS — N3941 Urge incontinence: Secondary | ICD-10-CM | POA: Insufficient documentation

## 2020-10-28 DIAGNOSIS — M545 Low back pain, unspecified: Secondary | ICD-10-CM | POA: Diagnosis not present

## 2020-10-28 DIAGNOSIS — I10 Essential (primary) hypertension: Secondary | ICD-10-CM | POA: Diagnosis not present

## 2020-10-28 MED ORDER — OXYCODONE-ACETAMINOPHEN 5-325 MG PO TABS: 1.0000 | ORAL_TABLET | Freq: Three times a day (TID) | ORAL | 0 refills | Status: DC | PRN

## 2020-10-28 MED ORDER — LOSARTAN POTASSIUM 100 MG PO TABS: 100.0000 mg | ORAL_TABLET | Freq: Every day | ORAL | 3 refills | Status: DC

## 2020-10-28 NOTE — Progress Notes (Signed)
Virtual Visit via Video Note  I connected with Annette Ramirez on 10/28/20 at  3:00 PM EDT by a video enabled telemedicine application and verified that I am speaking with the correct person using two identifiers.   I discussed the limitations of evaluation and management by telemedicine and the availability of in person appointments. The patient expressed understanding and agreed to proceed.  Patient location: at home Provider location: in home  Subjective:    CC: Chronic Pain Mgt  HPI: Annette Ramirez presents for follow-up chronic pain management.  She is doing okay overall.  She is taking her medication regularly and feels like it really is helpful. She has had to take more pain med that usual.  She has had several doctors appointment.   Saw pulmonology yesterday and they are ordering oxygen, 2 liters.Started her on [place].  She was actually on oxygen previously but then seemed to improve.  But her oxygen level was around 86% when she went to the office yesterday.  They should hopefully be delivering it later this week.  F/U urinary incontinence - we inc her Ditropan xl 10mg .  Unfortunately she really did not notice a significant improvement.  She did go to urology and had the appointment with them.  They did do a bladder scan.  They felt like some component of this could be neurogenic bladder.  They are going to try new medication but they are currently trying to get it approved through the insurance.  Hypertension- Pt denies chest pain, SOB, dizziness, or heart palpitations.  Taking meds as directed w/o problems.  Denies medication side effects.     Past medical history, Surgical history, Family history not pertinant except as noted below, Social history, Allergies, and medications have been entered into the medical record, reviewed, and corrections made.    Objective:    General: Speaking clearly in complete sentences without any shortness of breath.  Alert and oriented x3.   Normal judgment. No apparent acute distress.    Impression and Recommendations:    Encounter for chronic pain management Indication for chronic opioid: chronic back pain, neuropathy, and fibromyalgia Medication and dose: oxycodone-acetaminophen 5-325 mg tab # pills per month: 60 Last UDS date: 06/10/2020 Opioid Treatment Agreement signed (Y/N): yes Opioid Treatment Agreement last reviewed with patient:  06/2020 [facility] reviewed this encounter (include red flags):   Yes- no red flags.  F/U : 2 months.    HYPERTENSION, BENIGN ESSENTIAL Blood pressure looked good yesterday at the pulmonologist office.  We will go ahead and refill her losartan.  We will just need to make sure that her labs are up-to-date when I see her next time.  Urge incontinence of urine Discontinue oxybutynin.  They are working on getting her a new medication to start    No orders of the defined types were placed in this encounter.   Meds ordered this encounter  Medications   oxyCODONE-acetaminophen (PERCOCET/ROXICET) 5-325 MG tablet    Sig: Take 1 tablet by mouth every 8 (eight) hours as needed for severe pain. for pain    Dispense:  60 tablet    Refill:  0   losartan (COZAAR) 100 MG tablet    Sig: Take 1 tablet (100 mg total) by mouth daily.    Dispense:  90 tablet    Refill:  3    Requesting 1 year supply     I discussed the assessment and treatment plan with the patient. The patient was provided an opportunity to  ask questions and all were answered. The patient agreed with the plan and demonstrated an understanding of the instructions.   The patient was advised to call back or seek an in-person evaluation if the symptoms worsen or if the condition fails to improve as anticipated.   Beatrice Lecher, MD

## 2020-10-28 NOTE — Assessment & Plan Note (Signed)
Indication for chronic opioid:chronic back pain, neuropathy, and fibromyalgia Medication and dose:oxycodone-acetaminophen 5-325 mg tab # pills per month: 60 Last UDS date:06/10/2020 Opioid Treatment Agreement signed (Y/N):yes Opioid Treatment Agreement last reviewed with patient:06/2020 [facility] reviewed this encounter (include red flags):Yes- no red flags. F/U : 2 months.

## 2020-10-28 NOTE — Assessment & Plan Note (Signed)
Blood pressure looked good yesterday at the pulmonologist office.  We will go ahead and refill her losartan.  We will just need to make sure that her labs are up-to-date when I see her next time.

## 2020-10-28 NOTE — Assessment & Plan Note (Signed)
Discontinue oxybutynin.  They are working on getting her a new medication to start

## 2020-11-12 ENCOUNTER — Telehealth: Payer: Self-pay | Admitting: Family Medicine

## 2020-11-12 NOTE — Progress Notes (Signed)
  Chronic Care Management   Outreach Note  11/12/2020 Name: Annette Ramirez MRN: [id number] DOB: 1946-03-06  Referred by: Hali Marry, MD Reason for referral : No chief complaint on file.   An unsuccessful telephone outreach was attempted today. The patient was referred to the pharmacist for assistance with care management and care coordination.   Follow Up Plan:   Lauretta Grill Upstream Scheduler

## 2020-11-17 ENCOUNTER — Encounter: Payer: Self-pay | Admitting: Physician Assistant

## 2020-11-17 ENCOUNTER — Telehealth (INDEPENDENT_AMBULATORY_CARE_PROVIDER_SITE_OTHER): Payer: Medicare Other | Admitting: Physician Assistant

## 2020-11-17 VITALS — HR 66 | Temp 100.3°F

## 2020-11-17 DIAGNOSIS — U071 COVID-19: Secondary | ICD-10-CM | POA: Diagnosis not present

## 2020-11-17 DIAGNOSIS — R0902 Hypoxemia: Secondary | ICD-10-CM

## 2020-11-17 DIAGNOSIS — N1831 Chronic kidney disease, stage 3a: Secondary | ICD-10-CM | POA: Diagnosis not present

## 2020-11-17 MED ORDER — MOLNUPIRAVIR EUA 200MG CAPSULE: 4.0000 | ORAL_CAPSULE | Freq: Two times a day (BID) | ORAL | 0 refills | Status: AC

## 2020-11-17 NOTE — Patient Instructions (Signed)
Molnupiravir Oral Capsules What is this medication? MOLNUPIRAVIR (mol nue pir a vir) treats COVID-19. It is an antiviral medication. It may decrease the risk of developing severe symptoms of COVID-19. It may also decrease the chance of going to the hospital. This medication is not approved by the FDA. The FDA has authorized emergency use of thismedication during the COVID-19 pandemic. This medicine may be used for other purposes; ask your health care provider orpharmacist if you have questions. What should I tell my care team before I take this medication? They need to know if you have any of these conditions: Any allergies Any serious illness An unusual or allergic reaction to molnupiravir, other medications, foods, dyes, or preservatives Pregnant or trying to get pregnant Breast-feeding How should I use this medication? Take this medication by mouth with water. Take it as directed on the prescription label at the same time every day. Do not cut, crush or chew this medication. Swallow the capsules whole. You can take it with or without food. If it upsets your stomach, take it with food. Take all of this medication unless your care team tells you to stop it early. Keep taking it even if youthink you are better. Talk to your care team about the use of this medication in children. Specialcare may be needed. Overdosage: If you think you have taken too much of this medicine contact apoison control center or emergency room at once. NOTE: This medicine is only for you. Do not share this medicine with others. What if I miss a dose? If you miss a dose, take it as soon as you can unless it is more than 10 hours late. If it is more than 10 hours late, skip the missed dose. Take the next dose at the normal time. Do not take extra or 2 doses at the same time to makeup for the missed dose. What may interact with this medication? Interactions have not been studied. This list may not describe all possible  interactions. Give your health care provider a list of all the medicines, herbs, non-prescription drugs, or dietary supplements you use. Also tell them if you smoke, drink alcohol, or use illegaldrugs. Some items may interact with your medicine. What should I watch for while using this medication? Your condition will be monitored carefully while you are receiving this medication. Visit your care team for regular checkups. Tell your care team ifyour symptoms do not start to get better or if they get worse. Do not become pregnant while taking this medication. You may need a pregnancy test before starting this medication. Women must use a reliable form of birth control while taking this medication and for 4 days after stopping the medication. Women should inform their care team if they wish to become pregnant or think they might be pregnant. Men should not father a child while taking this medication and for 3 months after stopping it. There is potential for serious harm to an unborn child. Talk to your care team for more information. Do not breast-feed an infant while taking this medication and for 4 days afterstopping the medication. What side effects may I notice from receiving this medication? Side effects that you should report to your care team as soon as possible: Allergic reactions-skin rash, itching, hives, swelling of the face, lips, tongue, or throat Side effects that usually do not require medical attention (report these toyour care team if they continue or are bothersome): Diarrhea Dizziness Nausea This list may not describe all possible  side effects. Call your doctor for medical advice about side effects. You may report side effects to FDA at1-800-FDA-1088. Where should I keep my medication? Keep out of the reach of children and pets. Store at room temperature between 20 and 25 degrees C (68 and 77 degrees F).Get rid of any unused medication after the expiration date. To get rid of  medications that are no longer needed or have expired: Take the medication to a medication take-back program. Check with your pharmacy or law enforcement to find a location. If you cannot return the medication, check the label or package insert to see if the medication should be thrown out in the garbage or flushed down the toilet. If you are not sure, ask your care team. If it is safe to put it in the trash, take the medication out of the container. Mix the medication with cat litter, dirt, coffee grounds, or other unwanted substance. Seal the mixture in a bag or container. Put it in the trash. NOTE: This sheet is a summary. It may not cover all possible information. If you have questions about this medicine, talk to your doctor, pharmacist, orhealth care provider.  2022 Elsevier/Gold Standard (2020-04-28 16:16:01)  COVID-19: What to Do if You Are Sick CDC has updated isolation and quarantine recommendations for the public, and is revising the CDC website to reflect these changes. These recommendations do not apply to healthcare personnel and do not supersede state, local, tribal, or territorial laws, rules, andregulations. If you have a fever, cough or other symptoms, you might have COVID-19. Most people have mild illness and are able to recover at home. If you are sick: Keep track of your symptoms. If you have an emergency warning sign (including trouble breathing), call 911. Steps to help prevent the spread of COVID-19 if you are sick If you are sick with COVID-19 or think you might have COVID-19, follow the steps below to care for yourself and to help protect other peoplein your home and community. Stay home except to get medical care Stay home. Most people with COVID-19 have mild illness and can recover at home without medical care. Do not leave your home, except to get medical care. Do not visit public areas. Take care of yourself. Get rest and stay hydrated. Take over-the-counter medicines,  such as acetaminophen, to help you feel better. Stay in touch with your doctor. Call before you get medical care. Be sure to get care if you have trouble breathing, or have any other emergency warning signs, or if you think it is an emergency. Avoid public transportation, ride-sharing, or taxis. Separate yourself from other people As much as possible, stay in a specific room and away from other people and pets in your home. If possible, you should use a separate bathroom. If you need to be around other people or animals in oroutside of the home, wear a mask. Tell your close contactsthat they may have been exposed to COVID-19. An infected person can spread COVID-19 starting 48 hours (or 2 days) before the person has any symptoms or tests positive. By letting your close contacts know they may have been exposed to COVID-19, you are helping to protect everyone. Additional guidance is available for those living in close quarters and shared housing. See COVID-19 and Animals if you have questions about pets. If you are diagnosed with COVID-19, someone from the health department may call you. Answer the call to slow the spread. Monitor your symptoms Symptoms of COVID-19 include fever, cough,  or other symptoms. Follow care instructions from your healthcare provider and local health department. Your local health authorities may give instructions on checking your symptoms and reporting information. When to seek emergency medical attention Look for emergency warning signs* for COVID-19. If someone is showing any of these signs, seek emergency medical care immediately: Trouble breathing Persistent pain or pressure in the chest New confusion Inability to wake or stay awake Pale, gray, or blue-colored skin, lips, or nail beds, depending on skin tone *This list is not all possible symptoms. Please call your medical provider forany other symptoms that are severe or concerning to you. Call 911 or call ahead to  your local emergency facility: Notify the operator that you are seeking care for someone who has or may haveCOVID-19. Call ahead before visiting your doctor Call ahead. Many medical visits for routine care are being postponed or done by phone or telemedicine. If you have a medical appointment that cannot be postponed, call your doctor's office, and tell them you have or may have COVID-19. This will help the office protect themselves and other patients. Get tested If you have symptoms of COVID-19, get tested. While waiting for test results, you stay away from others, including staying apart from those living in your household. Self-tests are one of several options for testing for the virus that causes COVID-19 and may be more convenient than laboratory-based tests and point-of-care tests. Ask your healthcare provider or your local health department if you need help interpreting your test results. You can visit your state, tribal, local, and territorial health department's website to look for the latest local information on testing sites. If you are sick, wear a mask over your nose and mouth You should wear a mask over your nose and mouth if you must be around other people or animals, including pets (even at home). You don't need to wear the mask if you are alone. If you can't put on a mask (because of trouble breathing, for example), cover your coughs and sneezes in some other way. Try to stay at least 6 feet away from other people. This will help protect the people around you. Masks should not be placed on young children under age 15 years, anyone who has trouble breathing, or anyone who is not able to remove the mask without help. Note: During the COVID-19 pandemic, medical grade facemasks are reserved forhealthcare workers and some first responders. Cover your coughs and sneezes Cover your mouth and nose with a tissue when you cough or sneeze. Throw away used tissues in a lined trash  can. Immediately wash your hands with soap and water for at least 20 seconds. If soap and water are not available, clean your hands with an alcohol-based hand sanitizer that contains at least 60% alcohol. Clean your hands often Wash your hands often with soap and water for at least 20 seconds. This is especially important after blowing your nose, coughing, or sneezing; going to the bathroom; and before eating or preparing food. Use hand sanitizer if soap and water are not available. Use an alcohol-based hand sanitizer with at least 60% alcohol, covering all surfaces of your hands and rubbing them together until they feel dry. Soap and water are the best option, especially if hands are visibly dirty. Avoid touching your eyes, nose, and mouth with unwashed hands. Handwashing Tips Avoid sharing personal household items Do not share dishes, drinking glasses, cups, eating utensils, towels, or bedding with other people in your home. Wash these items thoroughly  after using them with soap and water or put in the dishwasher. Clean all "high-touch" surfaces every day Clean and disinfect high-touch surfaces in your "sick room" and bathroom; wear disposable gloves. Let someone else clean and disinfect surfaces in common areas, but you should clean your bedroom and bathroom, if possible. If a caregiver or other person needs to clean and disinfect a sick person's bedroom or bathroom, they should do so on an as-needed basis. The caregiver/other person should wear a mask and disposable gloves prior to cleaning. They should wait as long as possible after the person who is sick has used the bathroom before coming in to clean and use the bathroom. High-touch surfaces include phones, remote controls, counters, tabletops, doorknobs, bathroom fixtures, toilets, keyboards, tablets, and bedside tables. Clean and disinfect areas that may have blood, stool, or body fluids on them. Use household cleaners and disinfectants.  Clean the area or item with soap and water or another detergent if it is dirty. Then, use a household disinfectant. Be sure to follow the instructions on the label to ensure safe and effective use of the product. Many products recommend keeping the surface wet for several minutes to ensure germs are killed. Many also recommend precautions such as wearing gloves and making sure you have good ventilation during use of the product. Use a product from [employer] List N: Disinfectants for Coronavirus ([id number]). Complete Disinfection Guidance When you can be around others after being sick with COVID-19 Deciding when you can be around others is different for different situations. Find out when you can safely end home isolation. For any additional questions about your care,contact your healthcare provider or state or local health department. 04/09/2020 Content source: [facility] ([facility]), Division of Viral Diseases This information is not intended to replace advice given to you by your health care provider. Make sure you discuss any questions you have with your healthcare provider. Document Revised: 06/06/2020 Document Reviewed: 06/06/2020 Elsevier Patient Education  [place].

## 2020-11-17 NOTE — Progress Notes (Signed)
Telemedicine Visit via   Audio only - telephone (patient preference /  technical difficulty with MyChart video application)  I connected with Annette Ramirez on 11/17/20 at 3:15 PM  by phone or  telemedicine application as noted above  I verified that I am speaking with or regarding  the correct patient using two identifiers.  Participants: Myself, Nelson Chimes PA-C Patient: Annette Ramirez   Patient is at home in [facility] I am in office at [employer]    I discussed the limitations of evaluation and management  by telemedicine and the availability of in person appointments.  The participant(s) above expressed understanding and  agreed to proceed with this appointment via telemedicine.       History of Present Illness: Annette Ramirez is a 75 y.o. female who would like to discuss COVID-19 infection   Symptom onset Friday night 7/15 with dry cough Tested + at home on Saturday 7/16 She is immunized with [place] - no booster Taking Tylenol for body aches and headache Has had fevers up to 102.561F Tmax Febrile today 100.561F Reports oxygen has been at baseline at 91-93% - she has an "air exchange machine" at home that she does not use, does not have home oxygen Denies dyspnea, wheezing. Has not needed rescue inhaler.     Observations/Objective: Pulse 66   Temp 100.3 F (37.9 C)   SpO2 93%  BP Readings from Last 3 Encounters:  10/28/20 130/82  08/22/20 (!) 115/54  06/10/20 136/77   Exam limited by telehealth Home VS reviewed Psych: cooperative, normal thought content, normal speech  Lab and Radiology Results No results found for this or any previous visit (from the past 72 hour(s)). No results found.  Lab Results  Component Value Date   CREATININE 0.94 (H) 03/17/2020   BUN 18 03/17/2020   NA 137 03/17/2020   K 4.6 03/17/2020   CL 99 03/17/2020   CO2 29 03/17/2020   Lab Results  Component Value Date   ALT 16 03/17/2020   AST 20 03/17/2020    ALKPHOS 58 12/09/2016   BILITOT 0.5 03/17/2020      Assessment and Plan: 75 y.o. female with The primary encounter diagnosis was COVID-19 virus infection. Diagnoses of Stage 3a chronic kidney disease ([place]) and Hypoxemia were also pertinent to this visit.  Patient with relative hypoxia of 93% on RA at rest - counseled to go to the ER for persistent pulse ox <90% or worsening dyspnea, lethargy/confusion, blueish lips or finger tips Patient is a candidate for Molnupiravir due to known renal insufficiency Reviewed risks, benefits, administration and potential adverse effects of EUA antiviral medication including potential drug interactions - handout provided via [place] on supportive care Counseled on infection control and isolation precautions Counseled on ER precautions Follow-up prn persistent or worsening symptoms  PDMP not reviewed this encounter. No orders of the defined types were placed in this encounter.  Meds ordered this encounter  Medications   molnupiravir EUA 200 mg CAPS    Sig: Take 4 capsules (800 mg total) by mouth 2 (two) times daily for 5 days.    Dispense:  40 capsule    Refill:  0    Order Specific Question:   Supervising Provider    Answer:   Emeterio Reeve [[id number]]    Patient Instructions  Molnupiravir Oral Capsules What is this medication? MOLNUPIRAVIR (mol nue pir a vir) treats COVID-19. It is an antiviral medication. It may decrease the risk of developing severe symptoms  of COVID-19. It may also decrease the chance of going to the hospital. This medication is not approved by the FDA. The FDA has authorized emergency use of thismedication during the COVID-19 pandemic. This medicine may be used for other purposes; ask your health care provider orpharmacist if you have questions. What should I tell my care team before I take this medication? They need to know if you have any of these conditions: Any allergies Any serious illness An unusual or  allergic reaction to molnupiravir, other medications, foods, dyes, or preservatives Pregnant or trying to get pregnant Breast-feeding How should I use this medication? Take this medication by mouth with water. Take it as directed on the prescription label at the same time every day. Do not cut, crush or chew this medication. Swallow the capsules whole. You can take it with or without food. If it upsets your stomach, take it with food. Take all of this medication unless your care team tells you to stop it early. Keep taking it even if youthink you are better. Talk to your care team about the use of this medication in children. Specialcare may be needed. Overdosage: If you think you have taken too much of this medicine contact apoison control center or emergency room at once. NOTE: This medicine is only for you. Do not share this medicine with others. What if I miss a dose? If you miss a dose, take it as soon as you can unless it is more than 10 hours late. If it is more than 10 hours late, skip the missed dose. Take the next dose at the normal time. Do not take extra or 2 doses at the same time to makeup for the missed dose. What may interact with this medication? Interactions have not been studied. This list may not describe all possible interactions. Give your health care provider a list of all the medicines, herbs, non-prescription drugs, or dietary supplements you use. Also tell them if you smoke, drink alcohol, or use illegaldrugs. Some items may interact with your medicine. What should I watch for while using this medication? Your condition will be monitored carefully while you are receiving this medication. Visit your care team for regular checkups. Tell your care team ifyour symptoms do not start to get better or if they get worse. Do not become pregnant while taking this medication. You may need a pregnancy test before starting this medication. Women must use a reliable form of birth control  while taking this medication and for 4 days after stopping the medication. Women should inform their care team if they wish to become pregnant or think they might be pregnant. Men should not father a child while taking this medication and for 3 months after stopping it. There is potential for serious harm to an unborn child. Talk to your care team for more information. Do not breast-feed an infant while taking this medication and for 4 days afterstopping the medication. What side effects may I notice from receiving this medication? Side effects that you should report to your care team as soon as possible: Allergic reactions-skin rash, itching, hives, swelling of the face, lips, tongue, or throat Side effects that usually do not require medical attention (report these toyour care team if they continue or are bothersome): Diarrhea Dizziness Nausea This list may not describe all possible side effects. Call your doctor for medical advice about side effects. You may report side effects to FDA at1-800-FDA-1088. Where should I keep my medication? Keep out of  the reach of children and pets. Store at room temperature between 20 and 25 degrees C (68 and 77 degrees F).Get rid of any unused medication after the expiration date. To get rid of medications that are no longer needed or have expired: Take the medication to a medication take-back program. Check with your pharmacy or law enforcement to find a location. If you cannot return the medication, check the label or package insert to see if the medication should be thrown out in the garbage or flushed down the toilet. If you are not sure, ask your care team. If it is safe to put it in the trash, take the medication out of the container. Mix the medication with cat litter, dirt, coffee grounds, or other unwanted substance. Seal the mixture in a bag or container. Put it in the trash. NOTE: This sheet is a summary. It may not cover all possible information. If you  have questions about this medicine, talk to your doctor, pharmacist, orhealth care provider.  2022 Elsevier/Gold Standard (2020-04-28 16:16:01)  COVID-19: What to Do if You Are Sick CDC has updated isolation and quarantine recommendations for the public, and is revising the CDC website to reflect these changes. These recommendations do not apply to healthcare personnel and do not supersede state, local, tribal, or territorial laws, rules, andregulations. If you have a fever, cough or other symptoms, you might have COVID-19. Most people have mild illness and are able to recover at home. If you are sick: Keep track of your symptoms. If you have an emergency warning sign (including trouble breathing), call 911. Steps to help prevent the spread of COVID-19 if you are sick If you are sick with COVID-19 or think you might have COVID-19, follow the steps below to care for yourself and to help protect other peoplein your home and community. Stay home except to get medical care Stay home. Most people with COVID-19 have mild illness and can recover at home without medical care. Do not leave your home, except to get medical care. Do not visit public areas. Take care of yourself. Get rest and stay hydrated. Take over-the-counter medicines, such as acetaminophen, to help you feel better. Stay in touch with your doctor. Call before you get medical care. Be sure to get care if you have trouble breathing, or have any other emergency warning signs, or if you think it is an emergency. Avoid public transportation, ride-sharing, or taxis. Separate yourself from other people As much as possible, stay in a specific room and away from other people and pets in your home. If possible, you should use a separate bathroom. If you need to be around other people or animals in oroutside of the home, wear a mask. Tell your close contactsthat they may have been exposed to COVID-19. An infected person can spread COVID-19 starting  48 hours (or 2 days) before the person has any symptoms or tests positive. By letting your close contacts know they may have been exposed to COVID-19, you are helping to protect everyone. Additional guidance is available for those living in close quarters and shared housing. See COVID-19 and Animals if you have questions about pets. If you are diagnosed with COVID-19, someone from the health department may call you. Answer the call to slow the spread. Monitor your symptoms Symptoms of COVID-19 include fever, cough, or other symptoms. Follow care instructions from your healthcare provider and local health department. Your local health authorities may give instructions on checking your symptoms and reporting information.  When to seek emergency medical attention Look for emergency warning signs* for COVID-19. If someone is showing any of these signs, seek emergency medical care immediately: Trouble breathing Persistent pain or pressure in the chest New confusion Inability to wake or stay awake Pale, gray, or blue-colored skin, lips, or nail beds, depending on skin tone *This list is not all possible symptoms. Please call your medical provider forany other symptoms that are severe or concerning to you. Call 911 or call ahead to your local emergency facility: Notify the operator that you are seeking care for someone who has or may haveCOVID-19. Call ahead before visiting your doctor Call ahead. Many medical visits for routine care are being postponed or done by phone or telemedicine. If you have a medical appointment that cannot be postponed, call your doctor's office, and tell them you have or may have COVID-19. This will help the office protect themselves and other patients. Get tested If you have symptoms of COVID-19, get tested. While waiting for test results, you stay away from others, including staying apart from those living in your household. Self-tests are one of several options for testing  for the virus that causes COVID-19 and may be more convenient than laboratory-based tests and point-of-care tests. Ask your healthcare provider or your local health department if you need help interpreting your test results. You can visit your state, tribal, local, and territorial health department's website to look for the latest local information on testing sites. If you are sick, wear a mask over your nose and mouth You should wear a mask over your nose and mouth if you must be around other people or animals, including pets (even at home). You don't need to wear the mask if you are alone. If you can't put on a mask (because of trouble breathing, for example), cover your coughs and sneezes in some other way. Try to stay at least 6 feet away from other people. This will help protect the people around you. Masks should not be placed on young children under age 89 years, anyone who has trouble breathing, or anyone who is not able to remove the mask without help. Note: During the COVID-19 pandemic, medical grade facemasks are reserved forhealthcare workers and some first responders. Cover your coughs and sneezes Cover your mouth and nose with a tissue when you cough or sneeze. Throw away used tissues in a lined trash can. Immediately wash your hands with soap and water for at least 20 seconds. If soap and water are not available, clean your hands with an alcohol-based hand sanitizer that contains at least 60% alcohol. Clean your hands often Wash your hands often with soap and water for at least 20 seconds. This is especially important after blowing your nose, coughing, or sneezing; going to the bathroom; and before eating or preparing food. Use hand sanitizer if soap and water are not available. Use an alcohol-based hand sanitizer with at least 60% alcohol, covering all surfaces of your hands and rubbing them together until they feel dry. Soap and water are the best option, especially if hands are visibly  dirty. Avoid touching your eyes, nose, and mouth with unwashed hands. Handwashing Tips Avoid sharing personal household items Do not share dishes, drinking glasses, cups, eating utensils, towels, or bedding with other people in your home. Wash these items thoroughly after using them with soap and water or put in the dishwasher. Clean all "high-touch" surfaces every day Clean and disinfect high-touch surfaces in your "sick room" and  bathroom; wear disposable gloves. Let someone else clean and disinfect surfaces in common areas, but you should clean your bedroom and bathroom, if possible. If a caregiver or other person needs to clean and disinfect a sick person's bedroom or bathroom, they should do so on an as-needed basis. The caregiver/other person should wear a mask and disposable gloves prior to cleaning. They should wait as long as possible after the person who is sick has used the bathroom before coming in to clean and use the bathroom. High-touch surfaces include phones, remote controls, counters, tabletops, doorknobs, bathroom fixtures, toilets, keyboards, tablets, and bedside tables. Clean and disinfect areas that may have blood, stool, or body fluids on them. Use household cleaners and disinfectants. Clean the area or item with soap and water or another detergent if it is dirty. Then, use a household disinfectant. Be sure to follow the instructions on the label to ensure safe and effective use of the product. Many products recommend keeping the surface wet for several minutes to ensure germs are killed. Many also recommend precautions such as wearing gloves and making sure you have good ventilation during use of the product. Use a product from [employer] List N: Disinfectants for Coronavirus (EGBTD-17). Complete Disinfection Guidance When you can be around others after being sick with COVID-19 Deciding when you can be around others is different for different situations. Find out when you can  safely end home isolation. For any additional questions about your care,contact your healthcare provider or state or local health department. 04/09/2020 Content source: [facility] ([facility]), Division of Viral Diseases This information is not intended to replace advice given to you by your health care provider. Make sure you discuss any questions you have with your healthcare provider. Document Revised: 06/06/2020 Document Reviewed: 06/06/2020 Elsevier Patient Education  [place].  Instructions sent via [place].   Follow Up Instructions: No follow-ups on file.    I discussed the assessment and treatment plan with the patient. The patient was provided an opportunity to ask questions and all were answered. The patient agreed with the plan and demonstrated an understanding of the instructions.   The patient was advised to call back or seek an in-person evaluation if any new concerns, if symptoms worsen or if the condition fails to improve as anticipated.  21 minutes of non-face-to-face time was provided during this encounter.      . . . . . . . . . . . . . Marland Kitchen                   Historical information moved to improve visibility of documentation.  Past Medical History:  Diagnosis Date   Anxiety    Asthma    COPD (chronic obstructive pulmonary disease) ([place])    Depression    Ductal carcinoma in situ (DCIS) of right breast 08/16/2019   Exertional shortness of breath    Family history of anesthesia complication    "granddaughter has PONV" (02/28/2013)   Fibromyalgia    GERD (gastroesophageal reflux disease)    H/O hiatal hernia    Hypertension    Migraines    "in the past; they've stopped" (02/28/2013)   Opiate use 06/06/2017   Osteoporosis    PONV (postoperative nausea and vomiting)    Sinus headache    Past Surgical History:  Procedure Laterality Date   ABDOMINAL HYSTERECTOMY  1975    APPENDECTOMY  1975   BREAST CYST ASPIRATION Left 1980's  COLONOSCOPY W/ BIOPSIES AND POLYPECTOMY  2012   DILATION AND CURETTAGE OF UTERUS  1960's   EXCISIONAL HEMORRHOIDECTOMY  1980's   LUMBAR LAMINECTOMY  02/28/2013   LUMBAR LAMINECTOMY/DECOMPRESSION MICRODISCECTOMY N/A 02/28/2013   Procedure: LUMBAR LAMINECTOMY/DECOMPRESSION MICRODISCECTOMY/Lumbar 4-5 decompression;  Surgeon: Sinclair Ship, MD;  Location: [facility];  Service: Orthopedics;  Laterality: N/A;  Lumbar 4-5 decompression   TONSILLECTOMY  1950   TUBAL LIGATION  1972   Social History   Tobacco Use   Smoking status: Former    Packs/day: 0.25    Years: 40.00    Pack years: 10.00    Types: Cigarettes    Quit date: 02/01/2020    Years since quitting: 0.8   Smokeless tobacco: Never   Tobacco comments:    02/28/2013 "started smoking eletronic cigarettes a couple years ago"  Substance Use Topics   Alcohol use: Not Currently    Comment: occasionally social   family history includes Alzheimer's disease in her mother; Prostate cancer in an other family member; Stroke in an other family member.  Medications: Current Outpatient Medications  Medication Sig Dispense Refill   albuterol (PROVENTIL HFA;VENTOLIN HFA) 108 (90 Base) MCG/ACT inhaler Inhale 2 puffs into the lungs every 6 (six) hours as needed for wheezing. 54 g 3   amitriptyline (ELAVIL) 25 MG tablet TAKE 1 TABLET BY MOUTH AT  BEDTIME 90 tablet 1   amLODipine (NORVASC) 5 MG tablet Take 1 tablet (5 mg total) by mouth daily. 90 tablet 1   atorvastatin (LIPITOR) 20 MG tablet Take 1 tablet (20 mg total) by mouth daily. 90 tablet 3   Budeson-Glycopyrrol-Formoterol (BREZTRI AEROSPHERE) 160-9-4.8 MCG/ACT AERO Inhale 1 Inhaler into the lungs in the morning and at bedtime.     Cholecalciferol (VITAMIN D3 PO) Take 1 capsule by mouth daily.     FLUoxetine (PROZAC) 40 MG capsule Take 1 capsule (40 mg total) by mouth daily. 90 capsule 1   gabapentin (NEURONTIN) 100 MG capsule  TAKE ONE CAPSULE BY MOUTH  EVERY MORNING, ONE CAPSULE  AT NOON, THEN THREE  CAPSULES AT BEDTIME 450 capsule 1   losartan (COZAAR) 100 MG tablet Take 1 tablet (100 mg total) by mouth daily. 90 tablet 3   MAGNESIUM PO Take 1 tablet by mouth daily.     omeprazole (PRILOSEC) 40 MG capsule TAKE 1 CAPSULE BY MOUTH  DAILY 90 capsule 3   oxyCODONE-acetaminophen (PERCOCET/ROXICET) 5-325 MG tablet Take 1 tablet by mouth every 8 (eight) hours as needed for severe pain. for pain 60 tablet 0   QUEtiapine (SEROQUEL) 200 MG tablet TAKE 1 TABLET BY MOUTH AT  BEDTIME 90 tablet 3   Vibegron (GEMTESA) 75 MG TABS Take by mouth.     No current facility-administered medications for this visit.   Allergies  Allergen Reactions   Amoxicillin-Pot Clavulanate     REACTION: nausea and abdominal pain   Aspirin     REACTION: upset stomach   Codeine     REACTION: nausea   Hctz [Hydrochlorothiazide] Other (See Comments)    Hyponatremia   Nsaids Nausea Only   Other Nausea And Vomiting     If phone visit, billing and coding can please add appropriate modifier if needed

## 2020-11-23 DIAGNOSIS — J449 Chronic obstructive pulmonary disease, unspecified: Secondary | ICD-10-CM | POA: Diagnosis not present

## 2020-11-24 ENCOUNTER — Telehealth: Payer: Self-pay | Admitting: Family Medicine

## 2020-11-24 DIAGNOSIS — U071 COVID-19: Secondary | ICD-10-CM | POA: Insufficient documentation

## 2020-11-24 NOTE — Chronic Care Management (AMB) (Signed)
  Chronic Care Management   Note  11/24/2020 Name: SKYYE GABA MRN: [id number] DOB: 06-Jul-1945  ALSION MALLEY is a 75 y.o. year old female who is a primary care patient of Madilyn Fireman, Rene Kocher, MD. I reached out to Dion Body by phone today in response to a referral sent by Ms. Guss Bunde PCP, Hali Marry, MD.   Ms. Musleh was given information about Chronic Care Management services today including:  CCM service includes personalized support from designated clinical staff supervised by her physician, including individualized plan of care and coordination with other care providers 24/7 contact phone numbers for assistance for urgent and routine care needs. Service will only be billed when office clinical staff spend 20 minutes or more in a month to coordinate care. Only one practitioner may furnish and bill the service in a calendar month. The patient may stop CCM services at any time (effective at the end of the month) by phone call to the office staff.   Patient agreed to services and verbal consent obtained.   Follow up plan:   Lauretta Grill [employer] Scheduler

## 2020-12-01 ENCOUNTER — Other Ambulatory Visit: Payer: Self-pay | Admitting: Family Medicine

## 2020-12-02 ENCOUNTER — Other Ambulatory Visit: Payer: Self-pay

## 2020-12-02 DIAGNOSIS — I10 Essential (primary) hypertension: Secondary | ICD-10-CM

## 2020-12-02 MED ORDER — ATENOLOL 100 MG PO TABS: 100.0000 mg | ORAL_TABLET | Freq: Two times a day (BID) | ORAL | 1 refills | Status: DC

## 2020-12-02 NOTE — Telephone Encounter (Signed)
Patient called in for refill on Atenolol. She has been getting a yearly supply through [facility] and is now out. She is asking for new refill to be sent to [facility] so she can get it sooner .Medication pended

## 2020-12-02 NOTE — Telephone Encounter (Signed)
Meds ordered this encounter  Medications   atenolol (TENORMIN) 100 MG tablet    Sig: Take 1 tablet (100 mg total) by mouth 2 (two) times daily. 2 week supply while waiting on mail order    Dispense:  180 tablet    Refill:  1    Requesting 1 year supply

## 2020-12-15 ENCOUNTER — Other Ambulatory Visit: Payer: Self-pay | Admitting: Family Medicine

## 2020-12-24 DIAGNOSIS — J449 Chronic obstructive pulmonary disease, unspecified: Secondary | ICD-10-CM | POA: Diagnosis not present

## 2021-01-07 ENCOUNTER — Telehealth: Payer: Self-pay | Admitting: Pharmacist

## 2021-01-07 NOTE — Chronic Care Management (AMB) (Signed)
Chronic Care Management Pharmacy Assistant   Name: Annette Ramirez  MRN: [id number] DOB: Sep 02, 1945  Annette Ramirez is an 75 y.o. year old female who presents for his initial CCM visit with the clinical pharmacist.  Recent office visits:  11/17/20-[name] (Video visit) Seen for COVID positive. Start on molnupiravir EUA 200 mg CAPS.  10/28/20-[name] (PCP, Video visit) Seen for chronic pain management. Follow up in 2 months. 08/22/20-[name] (PCP) Seen for follow up visit for chronic pain management. Start on oxybutynin (DITROPAN-XL) 10 MG 24 hr tablet. Ambulatory referral to Urology. Follow up in 2 months.  Recent consult visits:  10/27/20-[name] (Pulmonology) Follow up visit. Start taking breztri inhaler 2 puffs twice daily. Follow up in 6 months. 09/24/20-[name] (Urology) Consultation. Follow up in 3 months. 08/04/20-[name] (General Surgery) Follow up visit for plastic surgery.  Hospital visits:  None in previous 6 months  Medications: Outpatient Encounter Medications as of 01/07/2021  Medication Sig   albuterol (PROVENTIL HFA;VENTOLIN HFA) 108 (90 Base) MCG/ACT inhaler Inhale 2 puffs into the lungs every 6 (six) hours as needed for wheezing.   amitriptyline (ELAVIL) 25 MG tablet TAKE 1 TABLET BY MOUTH AT  BEDTIME   amLODipine (NORVASC) 5 MG tablet Take 1 tablet (5 mg total) by mouth daily.   atenolol (TENORMIN) 100 MG tablet Take 1 tablet (100 mg total) by mouth 2 (two) times daily. 2 week supply while waiting on mail order   atorvastatin (LIPITOR) 20 MG tablet Take 1 tablet (20 mg total) by mouth daily.   Budeson-Glycopyrrol-Formoterol (BREZTRI AEROSPHERE) 160-9-4.8 MCG/ACT AERO Inhale 1 Inhaler into the lungs in the morning and at bedtime.   Cholecalciferol (VITAMIN D3 PO) Take 1 capsule by mouth daily.   FLUoxetine (PROZAC) 40 MG capsule Take 1 capsule (40 mg total) by mouth daily.   gabapentin  (NEURONTIN) 100 MG capsule TAKE ONE CAPSULE BY MOUTH  EVERY MORNING, ONE CAPSULE  AT NOON, THEN THREE  CAPSULES AT BEDTIME   losartan (COZAAR) 100 MG tablet Take 1 tablet (100 mg total) by mouth daily.   MAGNESIUM PO Take 1 tablet by mouth daily.   omeprazole (PRILOSEC) 40 MG capsule TAKE 1 CAPSULE BY MOUTH  DAILY   oxyCODONE-acetaminophen (PERCOCET/ROXICET) 5-325 MG tablet Take 1 tablet by mouth every 8 (eight) hours as needed for severe pain. for pain   QUEtiapine (SEROQUEL) 200 MG tablet TAKE 1 TABLET BY MOUTH AT  BEDTIME   Vibegron (GEMTESA) 75 MG TABS Take by mouth.   No facility-administered encounter medications on file as of 01/07/2021.   Albuterol (PROVENTIL HFA;VENTOLIN HFA) 108 (90 Base) MCG/ACT inhaler Last filled:11/26/20 75 DS Amitriptyline (ELAVIL) 25 MG tablet Last filled:10/14/20 90 DS AmLODipine (NORVASC) 5 MG tablet Last filled:11/05/20 90 DS Atenolol (TENORMIN) 100 MG tablet Last filled:12/02/20 90 DS Atorvastatin (LIPITOR) 20 MG tablet Last filled:06/10/20 90 DS Budeson-Glycopyrrol-Formoterol (BREZTRI AEROSPHERE) 160-9-4.8 MCG/ACT AERO Last filled:11/25/20 90 DS Cholecalciferol (VITAMIN D3 PO) Last filled:None noted FLUoxetine (PROZAC) 40 MG capsule Last filled:11/05/20 90 DS Gabapentin (NEURONTIN) 100 MG capsule Last filled:07/11/20 90 DS Losartan (COZAAR) 100 MG tablet Last filled:12/16/20 90 DS Omeprazole (PRILOSEC) 40 MG capsule Last filled:10/14/20 90 DS OxyCODONE-acetaminophen (PERCOCET/ROXICET) 5-325 MG tablet Last filled:11/21/20 20 DS QUEtiapine (SEROQUEL) 200 MG tablet Last filled:10/14/20 90 DS Vibegron (GEMTESA) 75 MG TABS Last filled:    Star Rating Drugs: Atorvastatin (LIPITOR) 20 MG tablet Last filled:06/10/20 90 DS. Losartan (COZAAR) 100 MG tablet Last filled:12/16/20 90  DS  Corrie Mckusick, [place]

## 2021-01-09 ENCOUNTER — Other Ambulatory Visit: Payer: Self-pay

## 2021-01-09 ENCOUNTER — Ambulatory Visit (INDEPENDENT_AMBULATORY_CARE_PROVIDER_SITE_OTHER): Payer: Medicare Other | Admitting: Pharmacist

## 2021-01-09 DIAGNOSIS — I10 Essential (primary) hypertension: Secondary | ICD-10-CM

## 2021-01-09 DIAGNOSIS — E782 Mixed hyperlipidemia: Secondary | ICD-10-CM

## 2021-01-09 DIAGNOSIS — F324 Major depressive disorder, single episode, in partial remission: Secondary | ICD-10-CM

## 2021-01-09 DIAGNOSIS — J42 Unspecified chronic bronchitis: Secondary | ICD-10-CM

## 2021-01-09 NOTE — Progress Notes (Signed)
Chronic Care Management Pharmacy Note  01/09/2021 Name:  Annette Ramirez MRN:  [id number] DOB:  1945-07-08  Summary: Addressed HTN, HLD, COPD, mental health. Patient reports sub-optimal control with insomnia and mental health. Tried melatonin and was ineffective.  Recommendations/Changes made from today's visit: Recommended consider increase in elavil to 46m daily for mental health + insomnia.   Plan: f/u with pharmacist in 1 month  Subjective: Annette TIGUEis an 75y.o. year old female who is a primary patient of Ramirez, CRene Kocher MD.  The CCM team was consulted for assistance with disease management and care coordination needs.    Engaged with patient by telephone for initial visit in response to provider referral for pharmacy case management and/or care coordination services.   Consent to Services:  The patient was given information about Chronic Care Management services, agreed to services, and gave verbal consent prior to initiation of services.  Please see initial visit note for detailed documentation.   Patient Care Team: MHali Marry MD as PCP - General (Family Medicine) PDarlis Loan MD as Referring Physician (Gastroenterology) KDarius Bump RRed [facility] Center LLCas Pharmacist (Pharmacist)  Recent office visits:  11/17/20-Annette EDonella Ramirez(Video visit) Seen for COVID positive. Start on molnupiravir EUA 200 mg CAPS.  10/28/20-Annette Ramirez (PCP, Video visit) Seen for chronic pain management. Follow up in 2 months. 08/22/20-Annette Ramirez (PCP) Seen for follow up visit for chronic pain management. Start on oxybutynin (DITROPAN-XL) 10 MG 24 hr tablet. Ambulatory referral to Urology. Follow up in 2 months.   Recent consult visits:  10/27/20-Annette Ramirez(Pulmonology) Follow up visit. Start taking breztri inhaler 2 puffs twice daily. Follow up in 6 months. 09/24/20-Annette Ramirez (Urology) Consultation. Follow up in 3  months. 08/04/20-Annette Ramirez (General Surgery) Follow up visit for plastic surgery.   Hospital visits:  None in previous 6 months    Objective:  Lab Results  Component Value Date   CREATININE 0.94 (H) 03/17/2020   CREATININE 1.21 (H) 08/17/2019   CREATININE 0.82 03/21/2019    Lab Results  Component Value Date   HGBA1C 5.4 03/17/2020   Last diabetic Eye exam: No results found for: HMDIABEYEEXA  Last diabetic Foot exam: No results found for: HMDIABFOOTEX      Component Value Date/Time   CHOL 136 03/17/2020 0000   TRIG 135 03/17/2020 0000   HDL 65 03/17/2020 0000   CHOLHDL 2.1 03/17/2020 0000   VLDL 19 05/29/2015 1024   LDLCALC 49 03/17/2020 0000    Hepatic Function Latest Ref Rng & Units 03/17/2020 08/17/2019 03/21/2019  Total Protein 6.1 - 8.1 g/dL 6.6 6.3 6.7  Albumin 3.6 - 5.1 g/dL - - -  AST 10 - 35 U/L _0 ALT 6 - 29 U/L _1 Alk Phosphatase 33 - 130 U/L - - -  Total Bilirubin 0.2 - 1.2 mg/dL 0.5 0.7 0.6    Lab Results  Component Value Date/Time   TSH 2.42 09/29/2017 02:27 PM   TSH 2.75 10/14/2016 02:54 PM   FREET4 1.3 10/14/2016 02:54 PM    CBC Latest Ref Rng & Units 03/17/2020 08/17/2019 03/21/2019  WBC 3.8 - 10.8 Thousand/uL 6.3 6.9 7.1  Hemoglobin 11.7 - 15.5 g/dL 12.4 11.9 12.8  Hematocrit 35.0 - 45.0 % 35.7 34.5(L) 37.6  Platelets 140 - 400 Thousand/uL 233 245 206    No results found for: VD25OH  Clinical ASCVD: Yes  The 10-year ASCVD risk score (Arnett DK, et  al., 2019) is: 26.6%   Values used to calculate the score:     Age: 61 years     Sex: Female     Is Non-Hispanic African American: No     Diabetic: No     Tobacco smoker: No     Systolic Blood Pressure: 675 mmHg     Is BP treated: Yes     HDL Cholesterol: 65 mg/dL     Total Cholesterol: 136 mg/dL     Social History   Tobacco Use  Smoking Status Former   Packs/day: 0.25   Years: 40.00   Pack years: 10.00   Types: Cigarettes   Quit date: 02/01/2020   Years since  quitting: 0.9  Smokeless Tobacco Never  Tobacco Comments   02/28/2013 "started smoking eletronic cigarettes a couple years ago"   BP Readings from Last 3 Encounters:  10/28/20 130/82  08/22/20 (!) 115/54  06/10/20 136/77   Pulse Readings from Last 3 Encounters:  11/17/20 66  08/22/20 68  06/10/20 68   Wt Readings from Last 3 Encounters:  08/22/20 168 lb (76.2 kg)  06/10/20 169 lb (76.7 kg)  03/17/20 164 lb (74.4 kg)    Assessment: Review of patient past medical history, allergies, medications, health status, including review of consultants reports, laboratory and other test data, was performed as part of comprehensive evaluation and provision of chronic care management services.   SDOH:  (Social Determinants of Health) assessments and interventions performed:    CCM Care Plan  Allergies  Allergen Reactions   Amoxicillin-Pot Clavulanate     REACTION: nausea and abdominal pain   Aspirin     REACTION: upset stomach   Codeine     REACTION: nausea   Hctz [Hydrochlorothiazide] Other (See Comments)    Hyponatremia   Nsaids Nausea Only   Other Nausea And Vomiting    Medications Reviewed Today     Reviewed by Kris Ramirez (Physician Assistant Certified) on 44/92/01 at [place] List Status: <None>   Medication Order Taking? Sig Documenting Provider Last Dose Status Informant  albuterol (PROVENTIL HFA;VENTOLIN HFA) 108 (90 Base) MCG/ACT inhaler [id number] Yes Inhale 2 puffs into the lungs every 6 (six) hours as needed for wheezing. Hali Marry, MD Taking Active   amitriptyline (ELAVIL) 25 MG tablet [id number] Yes TAKE 1 TABLET BY MOUTH AT  BEDTIME Hali Marry, MD Taking Active   amLODipine (NORVASC) 5 MG tablet [id number] Yes Take 1 tablet (5 mg total) by mouth daily. Hali Marry, MD Taking Active   atorvastatin (LIPITOR) 20 MG tablet [id number] Yes Take 1 tablet (20 mg total) by mouth daily. Hali Marry, MD Taking  Active   Budeson-Glycopyrrol-Formoterol [facility] AEROSPHERE) 160-9-4.8 MCG/ACT Hollie Salk [id number] Yes Inhale 1 Inhaler into the lungs in the morning and at bedtime. [provider] Taking Active   Cholecalciferol (VITAMIN D3 PO) [id number] Yes Take 1 capsule by mouth daily. [provider] Taking Active   FLUoxetine (PROZAC) 40 MG capsule [id number] Yes Take 1 capsule (40 mg total) by mouth daily. Hali Marry, MD Taking Active   gabapentin (NEURONTIN) 100 MG capsule [id number] Yes TAKE ONE CAPSULE BY MOUTH  EVERY MORNING, ONE CAPSULE  AT NOON, THEN THREE  CAPSULES AT BEDTIME Hali Marry, MD Taking Active   losartan (COZAAR) 100 MG tablet [id number] Yes Take 1 tablet (100 mg total) by mouth daily. Hali Marry, MD Taking Active   MAGNESIUM PO [id number] Yes Take 1 tablet by mouth  daily. [provider] Taking Active   molnupiravir EUA 200 mg CAPS [id number] Yes Take 4 capsules (800 mg total) by mouth 2 (two) times daily for 5 days. Nelson Chimes [place], [place]  Active   omeprazole (PRILOSEC) 40 MG capsule [id number] Yes TAKE 1 CAPSULE BY MOUTH  DAILY Hali Marry, MD Taking Active   oxyCODONE-acetaminophen (PERCOCET/ROXICET) 5-325 MG tablet [id number] Yes Take 1 tablet by mouth every 8 (eight) hours as needed for severe pain. for pain Hali Marry, MD Taking Active   QUEtiapine (SEROQUEL) 200 MG tablet [id number] Yes TAKE 1 TABLET BY MOUTH AT  BEDTIME Hali Marry, MD Taking Active   Vibegron [facility]) 75 MG TABS [id number] Yes Take by mouth. [provider] Taking Active             Patient Active Problem List   Diagnosis Date Noted   COVID-19 virus infection 11/24/2020   Urge incontinence of urine 10/28/2020   Weak urinary stream 08/22/2020   Grief 06/10/2020   Hypoxemia 10/03/2019   CKD (chronic kidney disease) stage 3, GFR 30-59 ml/min ([place]) 08/19/2019   Ductal carcinoma in situ (DCIS) of right breast  08/16/2019   Osteopenia 08/10/2019   Elevated triglycerides with high cholesterol 06/07/2019   OAB (overactive bladder) 04/19/2019   Opiate use 06/06/2017   Idiopathic peripheral neuropathy 01/06/2017   Hyponatremia 10/21/2016   Facet arthritis of lumbar region 07/13/2016   Encounter for chronic pain management 04/05/2016   Chronic lower back pain 03/15/2016   History of adenomatous polyp of colon 10/03/2015   Risk for coronary artery disease between 10% and 20% in next 10 years 05/30/2015   Arteriosclerosis of aorta ([place]) 03/17/2015   Thoracic neuritis 07/25/2012   COPD (chronic obstructive pulmonary disease) ([place]) 07/02/2010   Depression 02/13/2009   Major depressive disorder, single episode 02/13/2009   HEARING LOSS 09/25/2008   GERD 10/10/2007   INSOMNIA 03/21/2007   ANXIETY DISORDER, GENERALIZED 03/10/2006   TOBACCO ABUSE 03/10/2006   HYPERTENSION, BENIGN ESSENTIAL 03/10/2006   Fibromyalgia 03/10/2006   Osteoporosis 03/10/2006   Polymyositis ([place]) 03/10/2006    Immunization History  Administered Date(s) Administered   Fluad Quad(high Dose 65+) 04/19/2019, 01/09/2020   Influenza Split 03/22/2012   Influenza Whole 03/10/2006, 02/01/2008   Influenza, High Dose Seasonal PF 01/06/2017, 02/01/2018   Influenza,inj,Quad PF,6+ Mos 03/01/2013, 02/14/2014, 12/27/2014, 02/19/2016, 12/19/2017   Influenza-Unspecified 04/19/2019   PFIZER(Purple Top)SARS-COV-2 Vaccination 06/23/2019, 07/16/2019   Pneumococcal Conjugate-13 10/01/2013   Pneumococcal Polysaccharide-23 09/25/2008   Td 05/03/2004   Tdap 05/13/2015   Zoster, Live 05/30/2015    Conditions to be addressed/monitored: HTN, HLD, COPD, and Depression  There are no care plans that you recently modified to display for this patient.   Medication Assistance:  TBD, investigating options for Breztri and [place] financial assistance.  Patient's preferred pharmacy is:  [facility], [place] [address] [place] [address] Phone: [phone] Fax: [phone]  [facility]  ([place], [place] [facility] [address] [place] [address] [place] [address] Phone: [phone] Fax: [phone]  [name] [id number]: [phone]: [phone] Uses pill box? Yes Pt endorses 100% compliance  Follow Up:  Patient agrees to Care Plan and Follow-up.  Plan: Telephone follow up appointment with care management team member scheduled for:  1 month  Darius Bump

## 2021-01-10 NOTE — Patient Instructions (Signed)
Visit Information   PATIENT GOALS:   Goals Addressed             This Visit's Progress    Medication Management       Patient Goals/Self-Care Activities Over the next 30 days, patient will:  take medications as prescribed  Follow Up Plan: Telephone follow up appointment with care management team member scheduled for:  1 month         Consent to CCM Services: Annette Ramirez was given information about Chronic Care Management services including:  CCM service includes personalized support from designated clinical staff supervised by her physician, including individualized plan of care and coordination with other care providers 24/7 contact phone numbers for assistance for urgent and routine care needs. Service will only be billed when office clinical staff spend 20 minutes or more in a month to coordinate care. Only one practitioner may furnish and bill the service in a calendar month. The patient may stop CCM services at any time (effective at the end of the month) by phone call to the office staff. The patient will be responsible for cost sharing (co-pay) of up to 20% of the service fee (after annual deductible is met).  Patient agreed to services and verbal consent obtained.   Patient verbalizes understanding of instructions provided today and agrees to view in [place].   Telephone follow up appointment with care management team member scheduled for: 1 month  [place]: Patient Care Plan: Medication Management     Problem Identified: HTN, HLD, COPD, mental health      Long-Range Goal: Disease Progression Prevention   Start Date: 01/09/2021  This Visit's Progress: On track  Priority: High  Note:   Current Barriers:  None at present Pt describes may hit donut hole soon: Gemtesa, breztri are of concern  Pharmacist Clinical Goal(s):  Over the next 30 days, patient will achieve control of chronic conditions as evidenced by medication fill  history, lab values, and vital signs through collaboration with PharmD and provider.   Interventions: 1:1 collaboration with Hali Marry, MD regarding development and update of comprehensive plan of care as evidenced by provider attestation and co-signature Inter-disciplinary care team collaboration (see longitudinal plan of care) Comprehensive medication review performed; medication list updated in electronic medical record  Hypertension:  Controlled; current treatment:amlodipine 39m daily, losartan 1027mdaily, atenolol 10052mID;   Current home readings: not currently checking  Denies hypotensive/hypertensive symptoms  Recommended continue current regimen,  Hyperlipidemia:  Controlled; current treatment:atorvastatin 54m36mily; LDL 49   Recommended continue current regimen Chronic Obstructive Pulmonary Disease:  Controlled; current treatment:breztri BID, albuterol PRN;   0 exacerbations requiring treatment in the last 6 months   Recommended continue current regimen, Depression/Anxiety:  Uncontrolled; current treatment:elavil 25mg58mhtly, fluoxetine 40mg 85my, quetiapine 200mg n27mly ;   Connected with church grief counselor for mental health support  Recommended consider increase in elavil to 50mg da48mfor mental health + insomnia. Tried melatonin and was ineffective.     Patient Goals/Self-Care Activities Over the next 30 days, patient will:  take medications as prescribed  Follow Up Plan: Telephone follow up appointment with care management team member scheduled for:  1 month

## 2021-01-14 ENCOUNTER — Other Ambulatory Visit: Payer: Self-pay

## 2021-01-14 ENCOUNTER — Encounter: Payer: Self-pay | Admitting: Family Medicine

## 2021-01-14 ENCOUNTER — Ambulatory Visit (INDEPENDENT_AMBULATORY_CARE_PROVIDER_SITE_OTHER): Payer: Medicare Other | Admitting: Family Medicine

## 2021-01-14 VITALS — BP 104/57 | HR 67 | Ht 62.0 in | Wt 166.0 lb

## 2021-01-14 DIAGNOSIS — M545 Low back pain, unspecified: Secondary | ICD-10-CM | POA: Diagnosis not present

## 2021-01-14 DIAGNOSIS — M79661 Pain in right lower leg: Secondary | ICD-10-CM

## 2021-01-14 DIAGNOSIS — M79662 Pain in left lower leg: Secondary | ICD-10-CM | POA: Diagnosis not present

## 2021-01-14 DIAGNOSIS — I10 Essential (primary) hypertension: Secondary | ICD-10-CM

## 2021-01-14 DIAGNOSIS — F5101 Primary insomnia: Secondary | ICD-10-CM | POA: Diagnosis not present

## 2021-01-14 DIAGNOSIS — Z23 Encounter for immunization: Secondary | ICD-10-CM

## 2021-01-14 DIAGNOSIS — F4321 Adjustment disorder with depressed mood: Secondary | ICD-10-CM

## 2021-01-14 DIAGNOSIS — G8929 Other chronic pain: Secondary | ICD-10-CM | POA: Diagnosis not present

## 2021-01-14 DIAGNOSIS — Z0000 Encounter for general adult medical examination without abnormal findings: Secondary | ICD-10-CM | POA: Diagnosis not present

## 2021-01-14 DIAGNOSIS — R682 Dry mouth, unspecified: Secondary | ICD-10-CM

## 2021-01-14 MED ORDER — OXYCODONE-ACETAMINOPHEN 5-325 MG PO TABS: 1.0000 | ORAL_TABLET | Freq: Three times a day (TID) | ORAL | 0 refills | Status: DC | PRN

## 2021-01-14 NOTE — Assessment & Plan Note (Signed)
We will schedule ABIs for further work-up for claudication and peripheral vascular disease.  If negative then I do think a work-up with neurology is appropriate as I do feel like her neuropathy is getting worse.

## 2021-01-14 NOTE — Progress Notes (Signed)
MEDICARE ANNUAL WELLNESS VISIT  01/14/2021  Subjective:  Annette Ramirez is a 75 y.o. female patient of Metheney, Rene Kocher, MD who had a Medicare Annual Wellness Visit today. Lyvia is Retired and lives with their spouse. she has 2 children ( 1 has deceased). she reports that she is socially active and does interact with friends/family regularly. she is minimally physically active and enjoys cooking.  Patient Care Team: Hali Marry, MD as PCP - General (Family Medicine) Darlis Loan, MD as Referring Physician (Gastroenterology) Darius Bump, [facility] as Pharmacist (Pharmacist)  Advanced Directives 01/14/2021 07/16/2019 07/11/2018 10/01/2013 02/28/2013 02/28/2013 02/22/2013  Does Patient Have a Medical Advance Directive? Yes Yes Yes Patient has advance directive, copy not in chart - Patient has advance directive, copy not in chart Patient has advance directive, copy not in chart  Type of Advance Directive Living will;Healthcare Power of [place];Living will [place];Living will [place];Living will Advance instruction for mental health [place];Living will  Does patient want to make changes to medical advance directive? No - Patient declined No - Patient declined No - Patient declined - - - -  Copy of [place] in Chart? No - copy requested No - copy requested No - copy requested - - - -  Pre-existing out of facility DNR order (yellow form or pink MOST form) - - - - - - No    Hospital Utilization Over the Past 12 Months: # of hospitalizations or ER visits: 0 # of surgeries: 0  Review of Systems    Patient reports that her overall health is worse when compared to last year.  Review of Systems: History obtained from chart review and the patient  All other systems negative.  Pain Assessment Pain : No/denies pain     Current Medications & Allergies  (verified) Allergies as of 01/14/2021       Reactions   Amoxicillin-pot Clavulanate    REACTION: nausea and abdominal pain   Aspirin    REACTION: upset stomach   Codeine    REACTION: nausea   Hctz [hydrochlorothiazide] Other (See Comments)   Hyponatremia   Nsaids Nausea Only   Other Nausea And Vomiting        Medication List        Accurate as of January 14, 2021  9:11 AM. If you have any questions, ask your nurse or doctor.          albuterol 108 (90 Base) MCG/ACT inhaler Commonly known as: VENTOLIN HFA Inhale 2 puffs into the lungs every 6 (six) hours as needed for wheezing.   amitriptyline 25 MG tablet Commonly known as: ELAVIL TAKE 1 TABLET BY MOUTH AT  BEDTIME   amLODipine 5 MG tablet Commonly known as: NORVASC Take 1 tablet (5 mg total) by mouth daily.   atenolol 100 MG tablet Commonly known as: TENORMIN Take 1 tablet (100 mg total) by mouth 2 (two) times daily. 2 week supply while waiting on mail order   atorvastatin 20 MG tablet Commonly known as: LIPITOR Take 1 tablet (20 mg total) by mouth daily.   Breztri Aerosphere 160-9-4.8 MCG/ACT Aero Generic drug: Budeson-Glycopyrrol-Formoterol Inhale 1 Inhaler into the lungs in the morning and at bedtime.   FLUoxetine 40 MG capsule Commonly known as: PROZAC Take 1 capsule (40 mg total) by mouth daily.   gabapentin 100 MG capsule Commonly known as: NEURONTIN TAKE ONE CAPSULE BY MOUTH  EVERY  MORNING, ONE CAPSULE  AT NOON, THEN THREE  CAPSULES AT BEDTIME   Gemtesa 75 MG Tabs Generic drug: Vibegron Take by mouth.   losartan 100 MG tablet Commonly known as: COZAAR Take 1 tablet (100 mg total) by mouth daily.   MAGNESIUM PO Take 1 tablet by mouth daily.   omeprazole 40 MG capsule Commonly known as: PRILOSEC TAKE 1 CAPSULE BY MOUTH  DAILY   oxyCODONE-acetaminophen 5-325 MG tablet Commonly known as: PERCOCET/ROXICET Take 1 tablet by mouth every 8 (eight) hours as needed for severe pain. for pain    QUEtiapine 200 MG tablet Commonly known as: SEROQUEL TAKE 1 TABLET BY MOUTH AT  BEDTIME   vitamin B-12 100 MCG tablet Commonly known as: CYANOCOBALAMIN Take 100 mcg by mouth daily.   VITAMIN D3 PO Take 1 capsule by mouth daily.        History (reviewed): Past Medical History:  Diagnosis Date   Anxiety    Asthma    COPD (chronic obstructive pulmonary disease) ([place])    Depression    Ductal carcinoma in situ (DCIS) of right breast 08/16/2019   Exertional shortness of breath    Family history of anesthesia complication    "granddaughter has PONV" (02/28/2013)   Fibromyalgia    GERD (gastroesophageal reflux disease)    H/O hiatal hernia    Hypertension    Migraines    "in the past; they've stopped" (02/28/2013)   Opiate use 06/06/2017   Osteoporosis    PONV (postoperative nausea and vomiting)    Sinus headache    Past Surgical History:  Procedure Laterality Date   ABDOMINAL HYSTERECTOMY  1975   APPENDECTOMY  1975   BREAST CYST ASPIRATION Left 1980's   COLONOSCOPY W/ BIOPSIES AND POLYPECTOMY  2012   DILATION AND CURETTAGE OF UTERUS  1960's   EXCISIONAL HEMORRHOIDECTOMY  1980's   LUMBAR LAMINECTOMY  02/28/2013   LUMBAR LAMINECTOMY/DECOMPRESSION MICRODISCECTOMY N/A 02/28/2013   Procedure: LUMBAR LAMINECTOMY/DECOMPRESSION MICRODISCECTOMY/Lumbar 4-5 decompression;  Surgeon: Sinclair Ship, MD;  Location: [facility];  Service: Orthopedics;  Laterality: N/A;  Lumbar 4-5 decompression   TONSILLECTOMY  1950   TUBAL LIGATION  1972   Family History  Problem Relation Age of Onset   Alzheimer's disease Mother    Prostate cancer Other        grandfather   Stroke Other        grandparent   Social History   Socioeconomic History   Marital status: Married    Spouse name: [name]   Number of children: 2   Years of education: 13   Highest education level: Some college, no degree  Occupational History   Occupation: worked for [employer]    Comment: retired  Tobacco Use    Smoking status: Former    Packs/day: 0.25    Years: 40.00    Pack years: 10.00    Types: Cigarettes    Quit date: 02/01/2020    Years since quitting: 0.9   Smokeless tobacco: Never   Tobacco comments:    02/28/2013 "started smoking eletronic cigarettes a couple years ago"  Vaping Use   Vaping Use: Never used  Substance and Sexual Activity   Alcohol use: Not Currently    Alcohol/week: 3.0 standard drinks    Types: 3 Glasses of wine per week    Comment: occasionally social   Drug use: No   Sexual activity: Not Currently  Other Topics Concern   Not on file  Social History Narrative   Lives with her  husband. She recently lost her daughter and is in support group for grief. She enjoys cooking.    Social Determinants of Health   Financial Resource Strain: Low Risk    Difficulty of Paying Living Expenses: Not hard at all  Food Insecurity: No Food Insecurity   Worried About Charity fundraiser in the Last Year: Never true   [place] in the Last Year: Never true  Transportation Needs: No Transportation Needs   Lack of Transportation (Medical): No   Lack of Transportation (Non-Medical): No  Physical Activity: Inactive   Days of Exercise per Week: 0 days   Minutes of Exercise per Session: 0 min  Stress: No Stress Concern Present   Feeling of Stress : Not at all  Social Connections: Moderately Integrated   Frequency of Communication with Friends and Family: More than three times a week   Frequency of Social Gatherings with Friends and Family: Three times a week   Attends Religious Services: Never   Active Member of Clubs or Organizations: Yes   Attends Archivist Meetings: 1 to 4 times per year   Marital Status: Married    Activities of Daily Living In your present state of health, do you have any difficulty performing the following activities: 01/14/2021  Hearing? N  Vision? Y  Comment has cataracts but hasn't been scheduled for surgery yet.  Difficulty  concentrating or making decisions? Y  Comment diffculty remembering.  Walking or climbing stairs? Y  Comment neuropathy in feet  Dressing or bathing? N  Doing errands, shopping? N  Preparing Food and eating ? N  Using the Toilet? N  In the past six months, have you accidently leaked urine? Y  Comment she is on medication regarding it.  Do you have problems with loss of bowel control? N  Managing your Medications? N  Managing your Finances? N  Housekeeping or managing your Housekeeping? N  Some recent data might be hidden    Patient Education/Literacy How often do you need to have someone help you when you read instructions, pamphlets, or other written materials from your doctor or pharmacy?: 1 - Never What is the last grade level you completed in school?: Some college  Exercise Current Exercise Habits: The patient does not participate in regular exercise at present, Exercise limited by: neurologic condition(s);psychological condition(s) (neuropathy in both feet and depression due to a loss.)  Diet Patient reports consuming 3 meals a day and 3 snack(s) a day Patient reports that her primary diet is: Regular Patient reports that she does have regular access to food.   Depression Screen PHQ 2/9 Scores 01/14/2021 08/22/2020 03/06/2020 10/22/2019 07/19/2019 07/16/2019 08/22/2018  PHQ - 2 Score '2 2 1 1 2 '$ 0 1  PHQ- 9 Score '8 4 5 3 5 '$ - 6     Fall Risk Fall Risk  01/14/2021 08/22/2020 07/16/2019 07/11/2018 12/19/2017  Falls in the past year? '1 1 1 1 '$ No  Number falls in past yr: 0 0 1 0 -  Injury with Fall? 0 1 0 0 -  Risk for fall due to : History of fall(s) No Fall Risks Impaired balance/gait;History of fall(s) - -  Follow up Falls evaluation completed;Education provided;Falls prevention discussed Falls evaluation completed Falls prevention discussed Falls prevention discussed -     Objective:   There were no vitals taken for this visit.    There is no height or weight on file to  calculate BMI.  Hearing/Vision  Dejanira did  not have difficulty with hearing/understanding during the face-to-face interview Elishia did not have difficulty with her vision during the face-to-face interview Reports that she has had a formal eye exam by an eye care professional within the past year Reports that she has not had a formal hearing evaluation within the past year  Cognitive Function: 6CIT Screen 01/14/2021 07/16/2019 07/11/2018  What Year? 0 points 0 points 0 points  What month? 0 points 0 points 0 points  What time? 0 points 0 points 0 points  Count back from 20 0 points 0 points 0 points  Months in reverse 0 points 0 points 0 points  Repeat phrase 0 points 2 points 0 points  Total Score 0 2 0    Normal Cognitive Function Screening: Yes (Normal:0-7, Significant for Dysfunction: >8)  Immunization & Health Maintenance Record Immunization History  Administered Date(s) Administered   Fluad Quad(high Dose 65+) 04/19/2019, 01/09/2020, 02/01/2020, 01/14/2021   Influenza Split 03/22/2012   Influenza Whole 03/10/2006, 02/01/2008   Influenza, High Dose Seasonal PF 01/06/2017, 02/01/2018   Influenza,inj,Quad PF,6+ Mos 03/01/2013, 02/14/2014, 12/27/2014, 02/19/2016, 12/19/2017   Influenza-Unspecified 04/19/2019   PFIZER(Purple Top)SARS-COV-2 Vaccination 06/23/2019, 07/16/2019   Pneumococcal Conjugate-13 10/01/2013   Pneumococcal Polysaccharide-23 09/25/2008   Td 05/03/2004   Tdap 05/13/2015   Zoster, Live 05/30/2015    Health Maintenance  Topic Date Due   COVID-19 Vaccine (3 - Pfizer risk series) 01/30/2021 (Originally 08/13/2019)   Zoster Vaccines- Shingrix (1 of 2) 04/15/2021 (Originally 07/25/1964)   MAMMOGRAM  01/14/2022 (Originally 08/08/2020)   COLONOSCOPY (Pts 45-50yr Insurance coverage will need to be confirmed)  05/22/2022   TETANUS/TDAP  05/12/2025   INFLUENZA VACCINE  Completed   Hepatitis C Screening  Completed   PNA vac Low Risk Adult  Completed   HPV VACCINES   Aged Out       Assessment  This is a routine wellness examination for [employer]  Health Maintenance: Due or Overdue There are no preventive care reminders to display for this patient.   SDion Bodydoes not need a referral for Community Assistance: Care Management:   no Social Work:    no Prescription Assistance:  no Nutrition/Diabetes Education:  no   Plan:  Personalized Goals  Goals Addressed               This Visit's Progress     Patient Stated (pt-stated)        01/14/2021 AWV Goal: Exercise for General Health  Patient will verbalize understanding of the benefits of increased physical activity: Exercising regularly is important. It will improve your overall fitness, flexibility, and endurance. Regular exercise also will improve your overall health. It can help you control your weight, reduce stress, and improve your bone density. Over the next year, patient will increase physical activity as tolerated with a goal of at least 150 minutes of moderate physical activity per week.  You can tell that you are exercising at a moderate intensity if your heart starts beating faster and you start breathing faster but can still hold a conversation. Moderate-intensity exercise ideas include: Walking 1 mile (1.6 km) in about 15 minutes Biking Hiking Golfing Dancing Water aerobics Patient will verbalize understanding of everyday activities that increase physical activity by providing examples like the following: Yard work, such as: PSales promotion account executiveGardening Washing windows or floors Patient will be able to explain general safety guidelines for exercising:  Before  you start a new exercise program, talk with your health care provider. Do not exercise so much that you hurt yourself, feel dizzy, or get very short of breath. Wear comfortable clothes and wear shoes with good  support. Drink plenty of water while you exercise to prevent dehydration or heat stroke. Work out until your breathing and your heartbeat get faster.        Personalized Health Maintenance & Screening Recommendations  Mammogram  - Please have the report faxed to [place].  Shingles vaccine.  Lung Cancer Screening Recommended: yes - but patient declined at this time. (Low Dose CT Chest recommended if Age 77-80 years, 30 pack-year currently smoking OR have quit w/in past 15 years) Hepatitis C Screening recommended: no HIV Screening recommended: no  Advanced Directives: Written information was not given per the patient's request.  Referrals & Orders No orders of the defined types were placed in this encounter.   Follow-up Plan Follow-up with Hali Marry, MD as planned Schedule your shingles vaccine at the pharmacy.  Please have your mammogram report faxed to our office. Medicare wellness visit in one year.  AVS printed and given to the patient.   I have personally reviewed and noted the following in the patient's chart:   Medical and social history Use of alcohol, tobacco or illicit drugs  Current medications and supplements Functional ability and status Nutritional status Physical activity Advanced directives List of other physicians Hospitalizations, surgeries, and ER visits in previous 12 months Vitals Screenings to include cognitive, depression, and falls Referrals and appointments  In addition, I have reviewed and discussed with patient certain preventive protocols, quality metrics, and best practice recommendations. A written personalized care plan for preventive services as well as general preventive health recommendations were provided to patient.     Tinnie Gens, RN  01/14/2021

## 2021-01-14 NOTE — Assessment & Plan Note (Signed)
Indication for chronic opioid:chronic back pain, neuropathy, and fibromyalgia Medication and dose:oxycodone-acetaminophen 5-325 mg tab # pills per month: 60 Last UDS date:06/10/2020 Opioid Treatment Agreement signed (Y/N):yes Opioid Treatment Agreement last reviewed with patient:06/2020 [facility] reviewed this encounter (include red flags):Yes- no red flags. F/U : 2 months.

## 2021-01-14 NOTE — Progress Notes (Signed)
Established Patient Office Visit  Subjective:  Patient ID: Annette Ramirez, female    DOB: 02/09/1946  Age: 75 y.o. MRN: [id number]  CC:  Chief Complaint  Patient presents with   Pain Management    HPI [name] presents for chronic pain management for chronic back pain and neuropathy as well as fibromyalgia.  Currently on oxycodone 5/325 number 60/month.  Still struggling with insomnia related to her mood.  She did meet with our clinical pharmacist about a week ago and recommended possible increasing her Elavil to 50 mg.  She did try going up on the medication for about a week and says she really did not notice any improvement at all.  She complains of excessively and incredibly dry mouth.  She is constantly sucking on lozenges and sipping water and using Biotene she started to have significant dental problems and in fact just had a lot of dental work done.  She is wondering if it could be some of her medications.  Is also noticed some color change in her feet noticing it more on her left versus her right she is worried about blood flow.  In fact she complains that her muscles in her legs will feel tired and sometimes cramp with walking.  She has had COVID since I last saw her as well      Past Medical History:  Diagnosis Date   Anxiety    Asthma    COPD (chronic obstructive pulmonary disease) ([place])    Depression    Ductal carcinoma in situ (DCIS) of right breast 08/16/2019   Exertional shortness of breath    Family history of anesthesia complication    "granddaughter has PONV" (02/28/2013)   Fibromyalgia    GERD (gastroesophageal reflux disease)    H/O hiatal hernia    Hypertension    Migraines    "in the past; they've stopped" (02/28/2013)   Opiate use 06/06/2017   Osteoporosis    PONV (postoperative nausea and vomiting)    Sinus headache     Past Surgical History:  Procedure Laterality Date   ABDOMINAL HYSTERECTOMY  1975   APPENDECTOMY  1975   BREAST  CYST ASPIRATION Left 1980's   COLONOSCOPY W/ BIOPSIES AND POLYPECTOMY  2012   DILATION AND CURETTAGE OF UTERUS  1960's   EXCISIONAL HEMORRHOIDECTOMY  1980's   LUMBAR LAMINECTOMY  02/28/2013   LUMBAR LAMINECTOMY/DECOMPRESSION MICRODISCECTOMY N/A 02/28/2013   Procedure: LUMBAR LAMINECTOMY/DECOMPRESSION MICRODISCECTOMY/Lumbar 4-5 decompression;  Surgeon: Sinclair Ship, MD;  Location: [facility];  Service: Orthopedics;  Laterality: N/A;  Lumbar 4-5 decompression   TONSILLECTOMY  1950   TUBAL LIGATION  1972    Family History  Problem Relation Age of Onset   Alzheimer's disease Mother    Prostate cancer Other        grandfather   Stroke Other        grandparent    Social History   Socioeconomic History   Marital status: Married    Spouse name: [name]   Number of children: 2   Years of education: 13   Highest education level: Some college, no degree  Occupational History   Occupation: worked for attorney    Comment: retired  Tobacco Use   Smoking status: Former    Packs/day: 0.25    Years: 40.00    Pack years: 10.00    Types: Cigarettes    Quit date: 02/01/2020    Years since quitting: 0.9   Smokeless tobacco: Never   Tobacco  comments:    02/28/2013 "started smoking eletronic cigarettes a couple years ago"  Vaping Use   Vaping Use: Never used  Substance and Sexual Activity   Alcohol use: Not Currently    Alcohol/week: 3.0 standard drinks    Types: 3 Glasses of wine per week    Comment: occasionally social   Drug use: No   Sexual activity: Not Currently  Other Topics Concern   Not on file  Social History Narrative   Lives with her husband. She recently lost her daughter and is in support group for grief. She enjoys cooking.    Social Determinants of Health   Financial Resource Strain: Low Risk    Difficulty of Paying Living Expenses: Not hard at all  Food Insecurity: No Food Insecurity   Worried About Charity fundraiser in the Last Year: Never true   [place] in the Last Year: Never true  Transportation Needs: No Transportation Needs   Lack of Transportation (Medical): No   Lack of Transportation (Non-Medical): No  Physical Activity: Inactive   Days of Exercise per Week: 0 days   Minutes of Exercise per Session: 0 min  Stress: No Stress Concern Present   Feeling of Stress : Not at all  Social Connections: Moderately Integrated   Frequency of Communication with Friends and Family: More than three times a week   Frequency of Social Gatherings with Friends and Family: Three times a week   Attends Religious Services: Never   Active Member of Clubs or Organizations: Yes   Attends Archivist Meetings: 1 to 4 times per year   Marital Status: Married  Human resources officer Violence: Not At Risk   Fear of Current or Ex-Partner: No   Emotionally Abused: No   Physically Abused: No   Sexually Abused: No    Outpatient Medications Prior to Visit  Medication Sig Dispense Refill   albuterol (PROVENTIL HFA;VENTOLIN HFA) 108 (90 Base) MCG/ACT inhaler Inhale 2 puffs into the lungs every 6 (six) hours as needed for wheezing. 54 g 3   amitriptyline (ELAVIL) 25 MG tablet TAKE 1 TABLET BY MOUTH AT  BEDTIME 90 tablet 3   amLODipine (NORVASC) 5 MG tablet Take 1 tablet (5 mg total) by mouth daily. 90 tablet 1   atenolol (TENORMIN) 100 MG tablet Take 1 tablet (100 mg total) by mouth 2 (two) times daily. 2 week supply while waiting on mail order 180 tablet 1   atorvastatin (LIPITOR) 20 MG tablet Take 1 tablet (20 mg total) by mouth daily. 90 tablet 3   Budeson-Glycopyrrol-Formoterol (BREZTRI AEROSPHERE) 160-9-4.8 MCG/ACT AERO Inhale 1 Inhaler into the lungs in the morning and at bedtime.     Cholecalciferol (VITAMIN D3 PO) Take 1 capsule by mouth daily.     FLUoxetine (PROZAC) 40 MG capsule Take 1 capsule (40 mg total) by mouth daily. 90 capsule 1   gabapentin (NEURONTIN) 100 MG capsule TAKE ONE CAPSULE BY MOUTH  EVERY MORNING, ONE CAPSULE  AT NOON, THEN  THREE  CAPSULES AT BEDTIME 450 capsule 1   losartan (COZAAR) 100 MG tablet Take 1 tablet (100 mg total) by mouth daily. 90 tablet 3   MAGNESIUM PO Take 1 tablet by mouth daily.     omeprazole (PRILOSEC) 40 MG capsule TAKE 1 CAPSULE BY MOUTH  DAILY 90 capsule 3   QUEtiapine (SEROQUEL) 200 MG tablet TAKE 1 TABLET BY MOUTH AT  BEDTIME 90 tablet 3   Vibegron (GEMTESA) 75 MG TABS Take  by mouth.     vitamin B-12 (CYANOCOBALAMIN) 100 MCG tablet Take 100 mcg by mouth daily.     oxyCODONE-acetaminophen (PERCOCET/ROXICET) 5-325 MG tablet Take 1 tablet by mouth every 8 (eight) hours as needed for severe pain. for pain 60 tablet 0   No facility-administered medications prior to visit.    Allergies  Allergen Reactions   Amoxicillin-Pot Clavulanate     REACTION: nausea and abdominal pain   Aspirin     REACTION: upset stomach   Codeine     REACTION: nausea   Hctz [Hydrochlorothiazide] Other (See Comments)    Hyponatremia   Nsaids Nausea Only   Other Nausea And Vomiting    ROS Review of Systems    Objective:    Physical Exam Constitutional:      Appearance: Normal appearance. She is well-developed.  HENT:     Head: Normocephalic and atraumatic.  Cardiovascular:     Rate and Rhythm: Normal rate and regular rhythm.     Heart sounds: Normal heart sounds.  Pulmonary:     Effort: Pulmonary effort is normal.     Breath sounds: Normal breath sounds.  Skin:    General: Skin is warm and dry.  Neurological:     Mental Status: She is alert and oriented to person, place, and time.  Psychiatric:        Behavior: Behavior normal.    BP (!) 104/57   Pulse 67   Ht '5\' 2"'$  (1.575 m)   Wt 166 lb (75.3 kg)   SpO2 93%   BMI 30.36 kg/m  Wt Readings from Last 3 Encounters:  01/14/21 166 lb (75.3 kg)  08/22/20 168 lb (76.2 kg)  06/10/20 169 lb (76.7 kg)     There are no preventive care reminders to display for this patient.   There are no preventive care reminders to display for this  patient.  Lab Results  Component Value Date   TSH 2.42 09/29/2017   Lab Results  Component Value Date   WBC 6.3 03/17/2020   HGB 12.4 03/17/2020   HCT 35.7 03/17/2020   MCV 94.7 03/17/2020   PLT 233 03/17/2020   Lab Results  Component Value Date   NA 137 03/17/2020   K 4.6 03/17/2020   CO2 29 03/17/2020   GLUCOSE 98 03/17/2020   BUN 18 03/17/2020   CREATININE 0.94 (H) 03/17/2020   BILITOT 0.5 03/17/2020   ALKPHOS 58 12/09/2016   AST 20 03/17/2020   ALT 16 03/17/2020   PROT 6.6 03/17/2020   ALBUMIN 4.5 12/09/2016   CALCIUM 9.4 03/17/2020   Lab Results  Component Value Date   CHOL 136 03/17/2020   Lab Results  Component Value Date   HDL 65 03/17/2020   Lab Results  Component Value Date   LDLCALC 49 03/17/2020   Lab Results  Component Value Date   TRIG 135 03/17/2020   Lab Results  Component Value Date   CHOLHDL 2.1 03/17/2020   Lab Results  Component Value Date   HGBA1C 5.4 03/17/2020      Assessment & Plan:   Problem List Items Addressed This Visit       Cardiovascular and Mediastinum   HYPERTENSION, BENIGN ESSENTIAL    Well controlled. Continue current regimen. Follow up in  6mo        Digestive   Dry mouth     Other   INSOMNIA    She felt like the increase of the Elavil was not helpful.  So at this point I am wondering if we might even be able to just discontinue it if its not helping her at all then we can eliminate it and it might even help with some of her dry mouth that she is experiencing.      Grief    Still struggling but getting some support through her church  And feels that helps.  Offered grief counseling. I do feel like this is a huge contributor to her sleep issues.        Encounter for chronic pain management - Primary    Indication for chronic opioid: chronic back pain, neuropathy, and fibromyalgia Medication and dose: oxycodone-acetaminophen 5-325 mg tab # pills per month: 60 Last UDS date: 06/10/2020 Opioid Treatment  Agreement signed (Y/N): yes Opioid Treatment Agreement last reviewed with patient:  06/2020 [facility] reviewed this encounter (include red flags):   Yes- no red flags.  F/U : 2 months.        Relevant Medications   oxyCODONE-acetaminophen (PERCOCET/ROXICET) 5-325 MG tablet   Other Relevant Orders   DRUG MONITORING, PANEL 6 WITH CONFIRMATION, URINE   Chronic lower back pain   Relevant Medications   oxyCODONE-acetaminophen (PERCOCET/ROXICET) 5-325 MG tablet   Other Relevant Orders   DRUG MONITORING, PANEL 6 WITH CONFIRMATION, URINE   Bilateral calf pain    We will schedule ABIs for further work-up for claudication and peripheral vascular disease.  If negative then I do think a work-up with neurology is appropriate as I do feel like her neuropathy is getting worse.      Relevant Orders   VAS [place] ABI WITH/WO TBI   Other Visit Diagnoses     Need for immunization against influenza       Relevant Orders   Flu Vaccine QUAD High Dose(Fluad) (Completed)       Meds ordered this encounter  Medications   oxyCODONE-acetaminophen (PERCOCET/ROXICET) 5-325 MG tablet    Sig: Take 1 tablet by mouth every 8 (eight) hours as needed for severe pain. for pain    Dispense:  60 tablet    Refill:  0     Follow-up: Return in about 2 months (around 03/16/2021) for Pain mgt  .    Beatrice Lecher, MD

## 2021-01-14 NOTE — Assessment & Plan Note (Signed)
She felt like the increase of the Elavil was not helpful.  So at this point I am wondering if we might even be able to just discontinue it if its not helping her at all then we can eliminate it and it might even help with some of her dry mouth that she is experiencing.

## 2021-01-14 NOTE — Patient Instructions (Addendum)
[facility] Maintenance Summary and Written Plan of Care  Annette Ramirez ,  Thank you for allowing me to perform your Medicare Annual Wellness Visit and for your ongoing commitment to your health.   Health Maintenance & Immunization History Health Maintenance  Topic Date Due   COVID-19 Vaccine (3 - Pfizer risk series) 01/30/2021 (Originally 08/13/2019)   Zoster Vaccines- Shingrix (1 of 2) 04/15/2021 (Originally 07/25/1964)   MAMMOGRAM  01/14/2022 (Originally 08/08/2020)   COLONOSCOPY (Pts 45-26yr Insurance coverage will need to be confirmed)  05/22/2022   TETANUS/TDAP  05/12/2025   INFLUENZA VACCINE  Completed   Hepatitis C Screening  Completed   PNA vac Low Risk Adult  Completed   HPV VACCINES  Aged Out   Immunization History  Administered Date(s) Administered   Fluad Quad(high Dose 65+) 04/19/2019, 01/09/2020, 02/01/2020, 01/14/2021   Influenza Split 03/22/2012   Influenza Whole 03/10/2006, 02/01/2008   Influenza, High Dose Seasonal PF 01/06/2017, 02/01/2018   Influenza,inj,Quad PF,6+ Mos 03/01/2013, 02/14/2014, 12/27/2014, 02/19/2016, 12/19/2017   Influenza-Unspecified 04/19/2019   PFIZER(Purple Top)SARS-COV-2 Vaccination 06/23/2019, 07/16/2019   Pneumococcal Conjugate-13 10/01/2013   Pneumococcal Polysaccharide-23 09/25/2008   Td 05/03/2004   Tdap 05/13/2015   Zoster, Live 05/30/2015    These are the patient goals that we discussed:  Goals Addressed               This Visit's Progress     Patient Stated (pt-stated)        01/14/2021 AWV Goal: Exercise for General Health  Patient will verbalize understanding of the benefits of increased physical activity: Exercising regularly is important. It will improve your overall fitness, flexibility, and endurance. Regular exercise also will improve your overall health. It can help you control your weight, reduce stress, and improve your bone density. Over the next year, patient will increase physical  activity as tolerated with a goal of at least 150 minutes of moderate physical activity per week.  You can tell that you are exercising at a moderate intensity if your heart starts beating faster and you start breathing faster but can still hold a conversation. Moderate-intensity exercise ideas include: Walking 1 mile (1.6 km) in about 15 minutes Biking Hiking Golfing Dancing Water aerobics Patient will verbalize understanding of everyday activities that increase physical activity by providing examples like the following: Yard work, such as: PSales promotion account executiveGardening Washing windows or floors Patient will be able to explain general safety guidelines for exercising:  Before you start a new exercise program, talk with your health care provider. Do not exercise so much that you hurt yourself, feel dizzy, or get very short of breath. Wear comfortable clothes and wear shoes with good support. Drink plenty of water while you exercise to prevent dehydration or heat stroke. Work out until your breathing and your heartbeat get faster.          This is a list of Health Maintenance Items that are overdue or due now: Mammogram - [place]  Shingles vaccine.  Orders/Referrals Placed Today: No orders of the defined types were placed in this encounter.  (Contact our referral department at [phone] you have not spoken with someone about your referral appointment within the next 5 days)    Follow-up Plan Follow-up with MHali Marry MD as planned Schedule your shingles vaccine at the pharmacy.  Please have your mammogram report faxed to  our office. Medicare wellness visit in one year.  AVS printed and given to the patient.

## 2021-01-14 NOTE — Patient Instructions (Addendum)
Decrease your amitriptyline to every other day for 8 days and then stop and see if you feel like you really need it for sleep.  If you feel like it has actually been helpful then we can always continue it.

## 2021-01-14 NOTE — Assessment & Plan Note (Signed)
Well controlled. Continue current regimen. Follow up in  6 mo  

## 2021-01-14 NOTE — Assessment & Plan Note (Signed)
Still struggling but getting some support through her church  And feels that helps.  Offered grief counseling. I do feel like this is a huge contributor to her sleep issues.

## 2021-01-16 ENCOUNTER — Other Ambulatory Visit: Payer: Self-pay | Admitting: Family Medicine

## 2021-01-19 LAB — DRUG MONITORING, PANEL 6 WITH CONFIRMATION, URINE
6 Acetylmorphine: NEGATIVE ng/mL (ref <10)
Alcohol Metabolites: POSITIVE ng/mL — AB (ref <500)
Amphetamines: NEGATIVE ng/mL (ref <500)
Barbiturates: NEGATIVE ng/mL (ref <300)
Benzodiazepines: NEGATIVE ng/mL (ref <100)
Cocaine Metabolite: NEGATIVE ng/mL (ref <150)
Codeine: NEGATIVE ng/mL (ref <50)
Creatinine: 48.8 mg/dL (ref >=20.0)
Ethyl Glucuronide (ETG): 2231 ng/mL — ABNORMAL HIGH (ref <500)
Ethyl Sulfate (ETS): 529 ng/mL — ABNORMAL HIGH (ref <100)
Hydrocodone: NEGATIVE ng/mL (ref <50)
Hydromorphone: NEGATIVE ng/mL (ref <50)
Marijuana Metabolite: NEGATIVE ng/mL (ref <20)
Methadone Metabolite: NEGATIVE ng/mL (ref <100)
Morphine: NEGATIVE ng/mL (ref <50)
Norhydrocodone: NEGATIVE ng/mL (ref <50)
Noroxycodone: 772 ng/mL — ABNORMAL HIGH (ref <50)
Opiates: NEGATIVE ng/mL (ref <100)
Oxidant: NEGATIVE ug/mL (ref <200)
Oxycodone: 548 ng/mL — ABNORMAL HIGH (ref <50)
Oxycodone: POSITIVE ng/mL — AB (ref <100)
Oxymorphone: NEGATIVE ng/mL (ref <50)
Phencyclidine: NEGATIVE ng/mL (ref <25)
pH: 6.1 (ref 4.5–9.0)

## 2021-01-19 LAB — DM TEMPLATE

## 2021-01-19 NOTE — Progress Notes (Signed)
Hi Dalyce,  Your urine drug screen was positive for alcohol.  Please understand that it is extremely unsafe for you to drink and take chronic narcotics, especially with your respiratory problems. I know we have discussed this before.  Please understand that for your safety if you continue to drink alcohol we will have to taper off your medications or we will have to transfer you to pain management.  If you are struggling with being able to give up alcohol I am more than happy to provide resources.  There is a a program available through [facility] health in [place] and also through [facility].  They have inpatient and outpatient programs that can be very helpful.

## 2021-01-24 ENCOUNTER — Other Ambulatory Visit: Payer: Self-pay | Admitting: Family Medicine

## 2021-01-24 DIAGNOSIS — J449 Chronic obstructive pulmonary disease, unspecified: Secondary | ICD-10-CM | POA: Diagnosis not present

## 2021-01-24 DIAGNOSIS — L659 Nonscarring hair loss, unspecified: Secondary | ICD-10-CM

## 2021-01-24 DIAGNOSIS — K591 Functional diarrhea: Secondary | ICD-10-CM

## 2021-01-30 DIAGNOSIS — I10 Essential (primary) hypertension: Secondary | ICD-10-CM

## 2021-01-30 DIAGNOSIS — J42 Unspecified chronic bronchitis: Secondary | ICD-10-CM | POA: Diagnosis not present

## 2021-01-30 DIAGNOSIS — F324 Major depressive disorder, single episode, in partial remission: Secondary | ICD-10-CM

## 2021-01-30 DIAGNOSIS — E782 Mixed hyperlipidemia: Secondary | ICD-10-CM

## 2021-01-31 HISTORY — PX: MASTECTOMY: SHX3

## 2021-02-04 DIAGNOSIS — R0902 Hypoxemia: Secondary | ICD-10-CM | POA: Diagnosis not present

## 2021-02-04 DIAGNOSIS — L821 Other seborrheic keratosis: Secondary | ICD-10-CM | POA: Diagnosis not present

## 2021-02-04 DIAGNOSIS — Z886 Allergy status to analgesic agent status: Secondary | ICD-10-CM | POA: Diagnosis not present

## 2021-02-04 DIAGNOSIS — K449 Diaphragmatic hernia without obstruction or gangrene: Secondary | ICD-10-CM | POA: Diagnosis not present

## 2021-02-04 DIAGNOSIS — Z853 Personal history of malignant neoplasm of breast: Secondary | ICD-10-CM | POA: Diagnosis not present

## 2021-02-04 DIAGNOSIS — R519 Headache, unspecified: Secondary | ICD-10-CM | POA: Diagnosis not present

## 2021-02-04 DIAGNOSIS — M797 Fibromyalgia: Secondary | ICD-10-CM | POA: Diagnosis not present

## 2021-02-04 DIAGNOSIS — K760 Fatty (change of) liver, not elsewhere classified: Secondary | ICD-10-CM | POA: Diagnosis not present

## 2021-02-04 DIAGNOSIS — Z87891 Personal history of nicotine dependence: Secondary | ICD-10-CM | POA: Diagnosis not present

## 2021-02-04 DIAGNOSIS — Z9049 Acquired absence of other specified parts of digestive tract: Secondary | ICD-10-CM | POA: Diagnosis not present

## 2021-02-04 DIAGNOSIS — Z86010 Personal history of colonic polyps: Secondary | ICD-10-CM | POA: Diagnosis not present

## 2021-02-04 DIAGNOSIS — Z9889 Other specified postprocedural states: Secondary | ICD-10-CM | POA: Diagnosis not present

## 2021-02-04 DIAGNOSIS — I2510 Atherosclerotic heart disease of native coronary artery without angina pectoris: Secondary | ICD-10-CM | POA: Diagnosis not present

## 2021-02-04 DIAGNOSIS — E785 Hyperlipidemia, unspecified: Secondary | ICD-10-CM | POA: Diagnosis not present

## 2021-02-04 DIAGNOSIS — Z885 Allergy status to narcotic agent status: Secondary | ICD-10-CM | POA: Diagnosis not present

## 2021-02-04 DIAGNOSIS — J449 Chronic obstructive pulmonary disease, unspecified: Secondary | ICD-10-CM | POA: Diagnosis not present

## 2021-02-04 DIAGNOSIS — I10 Essential (primary) hypertension: Secondary | ICD-10-CM | POA: Diagnosis not present

## 2021-02-04 DIAGNOSIS — K279 Peptic ulcer, site unspecified, unspecified as acute or chronic, without hemorrhage or perforation: Secondary | ICD-10-CM | POA: Diagnosis not present

## 2021-02-04 DIAGNOSIS — Z7951 Long term (current) use of inhaled steroids: Secondary | ICD-10-CM | POA: Diagnosis not present

## 2021-02-04 DIAGNOSIS — Z79899 Other long term (current) drug therapy: Secondary | ICD-10-CM | POA: Diagnosis not present

## 2021-02-04 DIAGNOSIS — N651 Disproportion of reconstructed breast: Secondary | ICD-10-CM | POA: Diagnosis not present

## 2021-02-04 DIAGNOSIS — Z9011 Acquired absence of right breast and nipple: Secondary | ICD-10-CM | POA: Diagnosis not present

## 2021-02-04 DIAGNOSIS — K219 Gastro-esophageal reflux disease without esophagitis: Secondary | ICD-10-CM | POA: Diagnosis not present

## 2021-02-05 DIAGNOSIS — Z9049 Acquired absence of other specified parts of digestive tract: Secondary | ICD-10-CM | POA: Diagnosis not present

## 2021-02-05 DIAGNOSIS — M797 Fibromyalgia: Secondary | ICD-10-CM | POA: Diagnosis not present

## 2021-02-05 DIAGNOSIS — K760 Fatty (change of) liver, not elsewhere classified: Secondary | ICD-10-CM | POA: Diagnosis not present

## 2021-02-05 DIAGNOSIS — Z79899 Other long term (current) drug therapy: Secondary | ICD-10-CM | POA: Diagnosis not present

## 2021-02-05 DIAGNOSIS — K449 Diaphragmatic hernia without obstruction or gangrene: Secondary | ICD-10-CM | POA: Diagnosis not present

## 2021-02-05 DIAGNOSIS — J449 Chronic obstructive pulmonary disease, unspecified: Secondary | ICD-10-CM | POA: Diagnosis not present

## 2021-02-05 DIAGNOSIS — Z9889 Other specified postprocedural states: Secondary | ICD-10-CM | POA: Diagnosis not present

## 2021-02-05 DIAGNOSIS — Z87891 Personal history of nicotine dependence: Secondary | ICD-10-CM | POA: Diagnosis not present

## 2021-02-05 DIAGNOSIS — K279 Peptic ulcer, site unspecified, unspecified as acute or chronic, without hemorrhage or perforation: Secondary | ICD-10-CM | POA: Diagnosis not present

## 2021-02-05 DIAGNOSIS — I10 Essential (primary) hypertension: Secondary | ICD-10-CM | POA: Diagnosis not present

## 2021-02-05 DIAGNOSIS — E785 Hyperlipidemia, unspecified: Secondary | ICD-10-CM | POA: Diagnosis not present

## 2021-02-05 DIAGNOSIS — Z9011 Acquired absence of right breast and nipple: Secondary | ICD-10-CM | POA: Diagnosis not present

## 2021-02-05 DIAGNOSIS — Z7951 Long term (current) use of inhaled steroids: Secondary | ICD-10-CM | POA: Diagnosis not present

## 2021-02-05 DIAGNOSIS — Z885 Allergy status to narcotic agent status: Secondary | ICD-10-CM | POA: Diagnosis not present

## 2021-02-05 DIAGNOSIS — R519 Headache, unspecified: Secondary | ICD-10-CM | POA: Diagnosis not present

## 2021-02-05 DIAGNOSIS — R0902 Hypoxemia: Secondary | ICD-10-CM | POA: Diagnosis not present

## 2021-02-05 DIAGNOSIS — N651 Disproportion of reconstructed breast: Secondary | ICD-10-CM | POA: Diagnosis not present

## 2021-02-05 DIAGNOSIS — K219 Gastro-esophageal reflux disease without esophagitis: Secondary | ICD-10-CM | POA: Diagnosis not present

## 2021-02-05 DIAGNOSIS — Z886 Allergy status to analgesic agent status: Secondary | ICD-10-CM | POA: Diagnosis not present

## 2021-02-05 DIAGNOSIS — Z853 Personal history of malignant neoplasm of breast: Secondary | ICD-10-CM | POA: Diagnosis not present

## 2021-02-05 DIAGNOSIS — Z86010 Personal history of colonic polyps: Secondary | ICD-10-CM | POA: Diagnosis not present

## 2021-02-06 DIAGNOSIS — Z886 Allergy status to analgesic agent status: Secondary | ICD-10-CM | POA: Diagnosis not present

## 2021-02-06 DIAGNOSIS — I10 Essential (primary) hypertension: Secondary | ICD-10-CM | POA: Diagnosis not present

## 2021-02-06 DIAGNOSIS — E785 Hyperlipidemia, unspecified: Secondary | ICD-10-CM | POA: Diagnosis not present

## 2021-02-06 DIAGNOSIS — Z9011 Acquired absence of right breast and nipple: Secondary | ICD-10-CM | POA: Diagnosis not present

## 2021-02-06 DIAGNOSIS — M797 Fibromyalgia: Secondary | ICD-10-CM | POA: Diagnosis not present

## 2021-02-06 DIAGNOSIS — K449 Diaphragmatic hernia without obstruction or gangrene: Secondary | ICD-10-CM | POA: Diagnosis not present

## 2021-02-06 DIAGNOSIS — Z86010 Personal history of colonic polyps: Secondary | ICD-10-CM | POA: Diagnosis not present

## 2021-02-06 DIAGNOSIS — Z87891 Personal history of nicotine dependence: Secondary | ICD-10-CM | POA: Diagnosis not present

## 2021-02-06 DIAGNOSIS — K279 Peptic ulcer, site unspecified, unspecified as acute or chronic, without hemorrhage or perforation: Secondary | ICD-10-CM | POA: Diagnosis not present

## 2021-02-06 DIAGNOSIS — Z7951 Long term (current) use of inhaled steroids: Secondary | ICD-10-CM | POA: Diagnosis not present

## 2021-02-06 DIAGNOSIS — Z79899 Other long term (current) drug therapy: Secondary | ICD-10-CM | POA: Diagnosis not present

## 2021-02-06 DIAGNOSIS — K760 Fatty (change of) liver, not elsewhere classified: Secondary | ICD-10-CM | POA: Diagnosis not present

## 2021-02-06 DIAGNOSIS — Z853 Personal history of malignant neoplasm of breast: Secondary | ICD-10-CM | POA: Diagnosis not present

## 2021-02-06 DIAGNOSIS — Z885 Allergy status to narcotic agent status: Secondary | ICD-10-CM | POA: Diagnosis not present

## 2021-02-06 DIAGNOSIS — K219 Gastro-esophageal reflux disease without esophagitis: Secondary | ICD-10-CM | POA: Diagnosis not present

## 2021-02-06 DIAGNOSIS — Z9049 Acquired absence of other specified parts of digestive tract: Secondary | ICD-10-CM | POA: Diagnosis not present

## 2021-02-06 DIAGNOSIS — J449 Chronic obstructive pulmonary disease, unspecified: Secondary | ICD-10-CM | POA: Diagnosis not present

## 2021-02-06 DIAGNOSIS — R519 Headache, unspecified: Secondary | ICD-10-CM | POA: Diagnosis not present

## 2021-02-06 DIAGNOSIS — R0902 Hypoxemia: Secondary | ICD-10-CM | POA: Diagnosis not present

## 2021-02-06 DIAGNOSIS — Z9889 Other specified postprocedural states: Secondary | ICD-10-CM | POA: Diagnosis not present

## 2021-02-06 DIAGNOSIS — N651 Disproportion of reconstructed breast: Secondary | ICD-10-CM | POA: Diagnosis not present

## 2021-02-10 ENCOUNTER — Ambulatory Visit (INDEPENDENT_AMBULATORY_CARE_PROVIDER_SITE_OTHER): Payer: Medicare Other | Admitting: Pharmacist

## 2021-02-10 ENCOUNTER — Other Ambulatory Visit: Payer: Self-pay

## 2021-02-10 ENCOUNTER — Telehealth: Payer: Self-pay | Admitting: Family Medicine

## 2021-02-10 DIAGNOSIS — E782 Mixed hyperlipidemia: Secondary | ICD-10-CM

## 2021-02-10 DIAGNOSIS — J42 Unspecified chronic bronchitis: Secondary | ICD-10-CM

## 2021-02-10 DIAGNOSIS — F331 Major depressive disorder, recurrent, moderate: Secondary | ICD-10-CM

## 2021-02-10 DIAGNOSIS — I10 Essential (primary) hypertension: Secondary | ICD-10-CM

## 2021-02-10 NOTE — Telephone Encounter (Signed)
Please call patient and let her know that it is okay to go ahead and stop the amitriptyline I know we briefly discussed it since she did not feel like it was really helping and it can definitely contribute to dry mouth.  I think she still just taking 1 at night.  If she is then go ahead and take 1 every other night for 8 days and then stop.  If she still taking 2 then okay to drop back down to 1 4 8  days and then every other day for 8 days.

## 2021-02-10 NOTE — Patient Instructions (Signed)
Visit Information  PATIENT GOALS:  Goals Addressed             This Visit's Progress    Medication Management       Patient Goals/Self-Care Activities Over the next 90 days, patient will:  take medications as prescribed  Follow Up Plan: Telephone follow up appointment with care management team member scheduled for:  3 months        Patient verbalizes understanding of instructions provided today and agrees to view in [place].   Telephone follow up appointment with care management team member scheduled for: 3 months  Annette Ramirez

## 2021-02-10 NOTE — Progress Notes (Signed)
Chronic Care Management Pharmacy Note  02/10/2021 Name:  Annette Ramirez MRN:  [id number] DOB:  April 26, 1946  Summary: Addressed HTN, HLD, COPD, mental health. Patient reports sub-optimal control with insomnia and mental health. Tried melatonin and was ineffective. Tried increasing elavil to 57m and was also ineffective.  Recommendations/Changes made from today's visit: Recommend discontinue elavil at this time. Encouraged ongoing discussions w/PCP about safety w/ alcohol + opioid use as patient did bring up her urine drug screen report into conversation. Reinforced she can consider options for support.  Plan: f/u with pharmacist in 3 months.  Subjective: Annette Ramirez an 75y.o. year old female who is a primary patient of Metheney, CRene Kocher MD.  The CCM team was consulted for assistance with disease management and care coordination needs.    Engaged with patient by telephone for initial visit in response to provider referral for pharmacy case management and/or care coordination services.   Consent to Services:  The patient was given information about Chronic Care Management services, agreed to services, and gave verbal consent prior to initiation of services.  Please see initial visit note for detailed documentation.   Patient Care Team: MHali Marry MD as PCP - General (Family Medicine) PDarlis Loan MD as Referring Physician (Gastroenterology) KDarius Bump [facility] Pharmacist (Pharmacist)  Recent office visits:  11/17/20-Charley EDonella Stade(Video visit) Seen for COVID positive. Start on molnupiravir EUA 200 mg CAPS.  10/28/20-[name] (PCP, Video visit) Seen for chronic pain management. Follow up in 2 months. 08/22/20-[name] (PCP) Seen for follow up visit for chronic pain management. Start on oxybutynin (DITROPAN-XL) 10 MG 24 hr tablet. Ambulatory referral to Urology. Follow up in 2 months.   Recent consult visits:   10/27/20-Hannah MCora Collum(Pulmonology) Follow up visit. Start taking breztri inhaler 2 puffs twice daily. Follow up in 6 months. 09/24/20-[name] (Urology) Consultation. Follow up in 3 months. 08/04/20-[name] (General Surgery) Follow up visit for plastic surgery.   Hospital visits:  None in previous 6 months    Objective:  Lab Results  Component Value Date   CREATININE 0.94 (H) 03/17/2020   CREATININE 1.21 (H) 08/17/2019   CREATININE 0.82 03/21/2019    Lab Results  Component Value Date   HGBA1C 5.4 03/17/2020       Component Value Date/Time   CHOL 136 03/17/2020 0000   TRIG 135 03/17/2020 0000   HDL 65 03/17/2020 0000   CHOLHDL 2.1 03/17/2020 0000   VLDL 19 05/29/2015 1024   LDLCALC 49 03/17/2020 0000    Hepatic Function Latest Ref Rng & Units 03/17/2020 08/17/2019 03/21/2019  Total Protein 6.1 - 8.1 g/dL 6.6 6.3 6.7  Albumin 3.6 - 5.1 g/dL - - -  AST 10 - 35 U/L _0 ALT 6 - 29 U/L _1 Alk Phosphatase 33 - 130 U/L - - -  Total Bilirubin 0.2 - 1.2 mg/dL 0.5 0.7 0.6    Lab Results  Component Value Date/Time   TSH 2.42 09/29/2017 02:27 PM   TSH 2.75 10/14/2016 02:54 PM   FREET4 1.3 10/14/2016 02:54 PM    CBC Latest Ref Rng & Units 03/17/2020 08/17/2019 03/21/2019  WBC 3.8 - 10.8 Thousand/uL 6.3 6.9 7.1  Hemoglobin 11.7 - 15.5 g/dL 12.4 11.9 12.8  Hematocrit 35.0 - 45.0 % 35.7 34.5(L) 37.6  Platelets 140 - 400 Thousand/uL 233 245 206    Clinical ASCVD: Yes  The 10-year ASCVD  risk score (Arnett DK, et al., 2019) is: 21.8%   Values used to calculate the score:     Age: 75 years     Sex: Female     Is Non-Hispanic African American: No     Diabetic: No     Tobacco smoker: No     Systolic Blood Pressure: 144 mmHg     Is BP treated: Yes     HDL Cholesterol: 65 mg/dL     Total Cholesterol: 136 mg/dL     Social History   Tobacco Use  Smoking Status Former   Packs/day: 0.25   Years: 40.00   Pack years: 10.00   Types:  Cigarettes   Quit date: 02/01/2020   Years since quitting: 1.0  Smokeless Tobacco Never  Tobacco Comments   02/28/2013 "started smoking eletronic cigarettes a couple years ago"   BP Readings from Last 3 Encounters:  01/14/21 (!) 104/57  10/28/20 130/82  08/22/20 (!) 115/54   Pulse Readings from Last 3 Encounters:  01/14/21 67  11/17/20 66  08/22/20 68   Wt Readings from Last 3 Encounters:  01/14/21 166 lb (75.3 kg)  08/22/20 168 lb (76.2 kg)  06/10/20 169 lb (76.7 kg)    Assessment: Review of patient past medical history, allergies, medications, health status, including review of consultants reports, laboratory and other test data, was performed as part of comprehensive evaluation and provision of chronic care management services.   SDOH:  (Social Determinants of Health) assessments and interventions performed:    CCM Care Plan  Allergies  Allergen Reactions   Amoxicillin-Pot Clavulanate     REACTION: nausea and abdominal pain   Aspirin     REACTION: upset stomach   Codeine     REACTION: nausea   Hctz [Hydrochlorothiazide] Other (See Comments)    Hyponatremia   Nsaids Nausea Only   Other Nausea And Vomiting    Medications Reviewed Today     Reviewed by Tinnie Gens, RN (Registered Nurse) on 01/14/21 at [place] List Status: <None>   Medication Order Taking? Sig Documenting Provider Last Dose Status Informant  albuterol (PROVENTIL HFA;VENTOLIN HFA) 108 (90 Base) MCG/ACT inhaler [id number] Yes Inhale 2 puffs into the lungs every 6 (six) hours as needed for wheezing. Hali Marry, MD Taking Active   amitriptyline (ELAVIL) 25 MG tablet [id number] Yes TAKE 1 TABLET BY MOUTH AT  BEDTIME Hali Marry, MD Taking Active   amLODipine (NORVASC) 5 MG tablet [id number] Yes Take 1 tablet (5 mg total) by mouth daily. Hali Marry, MD Taking Active   atenolol (TENORMIN) 100 MG tablet [id number] Yes Take 1 tablet (100 mg total) by mouth 2 (two) times daily.  2 week supply while waiting on mail order Hali Marry, MD Taking Active   atorvastatin (LIPITOR) 20 MG tablet [id number] Yes Take 1 tablet (20 mg total) by mouth daily. Hali Marry, MD Taking Active   Budeson-Glycopyrrol-Formoterol [facility] AEROSPHERE) 160-9-4.8 MCG/ACT Hollie Salk [id number] Yes Inhale 1 Inhaler into the lungs in the morning and at bedtime. [provider] Taking Active   Cholecalciferol (VITAMIN D3 PO) [id number] Yes Take 1 capsule by mouth daily. [provider] Taking Active   FLUoxetine (PROZAC) 40 MG capsule [id number] Yes Take 1 capsule (40 mg total) by mouth daily. Hali Marry, MD Taking Active   gabapentin (NEURONTIN) 100 MG capsule [id number] Yes TAKE ONE CAPSULE BY MOUTH  EVERY MORNING, ONE CAPSULE  AT NOON, THEN THREE  CAPSULES AT BEDTIME Beatrice Lecher  D, MD Taking Active   losartan (COZAAR) 100 MG tablet [id number] Yes Take 1 tablet (100 mg total) by mouth daily. Hali Marry, MD Taking Active   MAGNESIUM PO [id number] Yes Take 1 tablet by mouth daily. [provider] Taking Active   omeprazole (PRILOSEC) 40 MG capsule [id number] Yes TAKE 1 CAPSULE BY MOUTH  DAILY Hali Marry, MD Taking Active   oxyCODONE-acetaminophen (PERCOCET/ROXICET) 5-325 MG tablet [id number] Yes Take 1 tablet by mouth every 8 (eight) hours as needed for severe pain. for pain Hali Marry, MD Taking Active   QUEtiapine (SEROQUEL) 200 MG tablet [id number] Yes TAKE 1 TABLET BY MOUTH AT  BEDTIME Hali Marry, MD Taking Active   Vibegron [facility]) 75 MG TABS [id number] Yes Take by mouth. [provider] Taking Active   vitamin B-12 (CYANOCOBALAMIN) 100 MCG tablet [id number] Yes Take 100 mcg by mouth daily. [provider] Taking Active             Patient Active Problem List   Diagnosis Date Noted   Dry mouth 01/14/2021   Bilateral calf pain 01/14/2021   COVID-19 virus infection 11/24/2020   Urge  incontinence of urine 10/28/2020   Weak urinary stream 08/22/2020   Grief 06/10/2020   Hypoxemia 10/03/2019   CKD (chronic kidney disease) stage 3, GFR 30-59 ml/min ([place]) 08/19/2019   Ductal carcinoma in situ (DCIS) of right breast 08/16/2019   Osteopenia 08/10/2019   Elevated triglycerides with high cholesterol 06/07/2019   OAB (overactive bladder) 04/19/2019   Opiate use 06/06/2017   Idiopathic peripheral neuropathy 01/06/2017   Hyponatremia 10/21/2016   Facet arthritis of lumbar region 07/13/2016   Encounter for chronic pain management 04/05/2016   Chronic lower back pain 03/15/2016   History of adenomatous polyp of colon 10/03/2015   Risk for coronary artery disease between 10% and 20% in next 10 years 05/30/2015   Arteriosclerosis of aorta ([place]) 03/17/2015   Thoracic neuritis 07/25/2012   COPD (chronic obstructive pulmonary disease) ([place]) 07/02/2010   Depression 02/13/2009   Major depressive disorder, single episode 02/13/2009   HEARING LOSS 09/25/2008   GERD 10/10/2007   INSOMNIA 03/21/2007   ANXIETY DISORDER, GENERALIZED 03/10/2006   TOBACCO ABUSE 03/10/2006   HYPERTENSION, BENIGN ESSENTIAL 03/10/2006   Fibromyalgia 03/10/2006   Osteoporosis 03/10/2006   Polymyositis ([place]) 03/10/2006    Immunization History  Administered Date(s) Administered   Fluad Quad(high Dose 65+) 04/19/2019, 01/09/2020, 02/01/2020, 01/14/2021   Influenza Split 03/22/2012   Influenza Whole 03/10/2006, 02/01/2008   Influenza, High Dose Seasonal PF 01/06/2017, 02/01/2018   Influenza,inj,Quad PF,6+ Mos 03/01/2013, 02/14/2014, 12/27/2014, 02/19/2016, 12/19/2017   Influenza-Unspecified 04/19/2019   PFIZER(Purple Top)SARS-COV-2 Vaccination 06/23/2019, 07/16/2019   Pneumococcal Conjugate-13 10/01/2013   Pneumococcal Polysaccharide-23 09/25/2008   Td 05/03/2004   Tdap 05/13/2015   Zoster, Live 05/30/2015    Conditions to be addressed/monitored: HTN, HLD, COPD, and Depression  There are no care  plans that you recently modified to display for this patient.   Medication Assistance:  TBD, investigating options for Breztri and [place] financial assistance.  Patient's preferred pharmacy is:  [place], [address] [place] [address] Phone: [phone] Fax: [phone]  [facility]  ([place], [facility] [address] [place] [address] [place] [address] Phone: [phone] Fax: [phone]  [id number] [place] [address] [place]  [place] [address] Phone: [phone] Fax: [phone]  [facility] ([facility]) - [place], [address] [place] [address] Phone: [phone] Fax: [phone]  Uses pill box? Yes Pt endorses 100% compliance  Follow Up:  Patient agrees to Care Plan and Follow-up.  Plan: Telephone follow up appointment with care management team member scheduled for:  3 months  Larinda Buttery, PharmD Clinical Pharmacist [facility] [phone]

## 2021-02-11 ENCOUNTER — Other Ambulatory Visit: Payer: Self-pay | Admitting: *Deleted

## 2021-02-11 NOTE — Telephone Encounter (Signed)
Pt states that she is only taking 1 tablet nightly.  Advised pt to change to every other night for 8 days and then stop completely.  Pt expressed understanding and repeated the instructions to me.  Charyl Bigger, CMA

## 2021-02-19 DIAGNOSIS — I129 Hypertensive chronic kidney disease with stage 1 through stage 4 chronic kidney disease, or unspecified chronic kidney disease: Secondary | ICD-10-CM | POA: Diagnosis not present

## 2021-02-19 DIAGNOSIS — N1831 Chronic kidney disease, stage 3a: Secondary | ICD-10-CM | POA: Diagnosis not present

## 2021-02-19 DIAGNOSIS — D0511 Intraductal carcinoma in situ of right breast: Secondary | ICD-10-CM | POA: Diagnosis not present

## 2021-02-19 DIAGNOSIS — Z9011 Acquired absence of right breast and nipple: Secondary | ICD-10-CM | POA: Diagnosis not present

## 2021-02-19 DIAGNOSIS — I10 Essential (primary) hypertension: Secondary | ICD-10-CM | POA: Diagnosis not present

## 2021-02-19 DIAGNOSIS — I700 Atherosclerosis of aorta: Secondary | ICD-10-CM | POA: Diagnosis not present

## 2021-02-19 DIAGNOSIS — M3320 Polymyositis, organ involvement unspecified: Secondary | ICD-10-CM | POA: Diagnosis not present

## 2021-02-19 DIAGNOSIS — Z79899 Other long term (current) drug therapy: Secondary | ICD-10-CM | POA: Diagnosis not present

## 2021-02-23 DIAGNOSIS — J449 Chronic obstructive pulmonary disease, unspecified: Secondary | ICD-10-CM | POA: Diagnosis not present

## 2021-02-26 DIAGNOSIS — Z9012 Acquired absence of left breast and nipple: Secondary | ICD-10-CM | POA: Diagnosis not present

## 2021-02-26 DIAGNOSIS — R531 Weakness: Secondary | ICD-10-CM | POA: Diagnosis not present

## 2021-02-26 DIAGNOSIS — M25512 Pain in left shoulder: Secondary | ICD-10-CM | POA: Diagnosis not present

## 2021-02-26 DIAGNOSIS — Z4889 Encounter for other specified surgical aftercare: Secondary | ICD-10-CM | POA: Diagnosis not present

## 2021-02-26 DIAGNOSIS — R0789 Other chest pain: Secondary | ICD-10-CM | POA: Diagnosis not present

## 2021-03-02 DIAGNOSIS — F331 Major depressive disorder, recurrent, moderate: Secondary | ICD-10-CM

## 2021-03-02 DIAGNOSIS — E782 Mixed hyperlipidemia: Secondary | ICD-10-CM

## 2021-03-02 DIAGNOSIS — I10 Essential (primary) hypertension: Secondary | ICD-10-CM

## 2021-03-02 DIAGNOSIS — J42 Unspecified chronic bronchitis: Secondary | ICD-10-CM

## 2021-03-16 ENCOUNTER — Ambulatory Visit (INDEPENDENT_AMBULATORY_CARE_PROVIDER_SITE_OTHER): Payer: Medicare Other | Admitting: Family Medicine

## 2021-03-16 ENCOUNTER — Encounter: Payer: Self-pay | Admitting: Family Medicine

## 2021-03-16 ENCOUNTER — Other Ambulatory Visit: Payer: Self-pay

## 2021-03-16 VITALS — BP 131/60 | HR 58 | Ht 62.0 in | Wt 158.0 lb

## 2021-03-16 DIAGNOSIS — M545 Low back pain, unspecified: Secondary | ICD-10-CM | POA: Diagnosis not present

## 2021-03-16 DIAGNOSIS — G8929 Other chronic pain: Secondary | ICD-10-CM

## 2021-03-16 DIAGNOSIS — E871 Hypo-osmolality and hyponatremia: Secondary | ICD-10-CM | POA: Diagnosis not present

## 2021-03-16 DIAGNOSIS — F324 Major depressive disorder, single episode, in partial remission: Secondary | ICD-10-CM

## 2021-03-16 DIAGNOSIS — I10 Essential (primary) hypertension: Secondary | ICD-10-CM

## 2021-03-16 NOTE — Progress Notes (Signed)
Established Patient Office Visit  Subjective:  Patient ID: Annette Ramirez, female    DOB: [birth year]  Age: 75 y.o. MRN: [id number]  CC:  Chief Complaint  Patient presents with   Pain Management    HPI Annette Ramirez presents for chronic pain management for chronic back pain and neuropathy as well as fibromyalgia.  Currently on oxycodone 5//325, #60 tabs per month.  She unfortunately still struggles with alcohol use.  And last urine drug screen came back positive for this. Losing her daughter recently has been very traumatic.    She recently had surgery with left breast mastectomy for ductal carcinoma in situ of the right breast, on February 04, 2021.  He is actually doing really well she still having a little bit of swelling over the chest but says that it actually went much better than when she had the right side done because on the right side she also had lymph nodes removed and that required multiple drains.  She says still little tender but she is doing better.  She ran out of the fluoxetine and has been off of it and hasn't really noticed a difference in how she feels. She would ike to not take anything for mood for now.    Past Medical History:  Diagnosis Date   Anxiety    Asthma    COPD (chronic obstructive pulmonary disease) ([place])    Depression    Ductal carcinoma in situ (DCIS) of right breast 08/16/2019   Exertional shortness of breath    Family history of anesthesia complication    "granddaughter has PONV" (02/28/2013)   Fibromyalgia    GERD (gastroesophageal reflux disease)    H/O hiatal hernia    Hypertension    Migraines    "in the past; they've stopped" (02/28/2013)   Opiate use 06/06/2017   Osteoporosis    PONV (postoperative nausea and vomiting)    Sinus headache     Past Surgical History:  Procedure Laterality Date   ABDOMINAL HYSTERECTOMY  1975   APPENDECTOMY  1975   BREAST CYST ASPIRATION Left 1980's   COLONOSCOPY W/ BIOPSIES AND POLYPECTOMY  2012    DILATION AND CURETTAGE OF UTERUS  1960's   EXCISIONAL HEMORRHOIDECTOMY  1980's   LUMBAR LAMINECTOMY  02/28/2013   LUMBAR LAMINECTOMY/DECOMPRESSION MICRODISCECTOMY N/A 02/28/2013   Procedure: LUMBAR LAMINECTOMY/DECOMPRESSION MICRODISCECTOMY/Lumbar 4-5 decompression;  Surgeon: Sinclair Ship, MD;  Location: [facility];  Service: Orthopedics;  Laterality: N/A;  Lumbar 4-5 decompression   TONSILLECTOMY  1950   TUBAL LIGATION  1972    Family History  Problem Relation Age of Onset   Alzheimer's disease Mother    Prostate cancer Other        grandfather   Stroke Other        grandparent    Social History   Socioeconomic History   Marital status: Married    Spouse name: Annette Ramirez   Number of children: 2   Years of education: 13   Highest education level: Some college, no degree  Occupational History   Occupation: worked for Forensic psychologist    Comment: retired  Tobacco Use   Smoking status: Former    Packs/day: 0.25    Years: 40.00    Pack years: 10.00    Types: Cigarettes    Quit date: 02/01/2020    Years since quitting: 1.1   Smokeless tobacco: Never   Tobacco comments:    02/28/2013 "started smoking eletronic cigarettes a couple years ago"  Vaping Use  Vaping Use: Never used  Substance and Sexual Activity   Alcohol use: Not Currently    Alcohol/week: 3.0 standard drinks    Types: 3 Glasses of wine per week    Comment: occasionally social   Drug use: No   Sexual activity: Not Currently  Other Topics Concern   Not on file  Social History Narrative   Lives with her husband. She recently lost her daughter and is in support group for grief. She enjoys cooking.    Social Determinants of Health   Financial Resource Strain: Low Risk    Difficulty of Paying Living Expenses: Not hard at all  Food Insecurity: No Food Insecurity   Worried About Charity fundraiser in the Last Year: Never true   [place] in the Last Year: Never true  Transportation Needs: No Transportation Needs    Lack of Transportation (Medical): No   Lack of Transportation (Non-Medical): No  Physical Activity: Inactive   Days of Exercise per Week: 0 days   Minutes of Exercise per Session: 0 min  Stress: No Stress Concern Present   Feeling of Stress : Not at all  Social Connections: Moderately Integrated   Frequency of Communication with Friends and Family: More than three times a week   Frequency of Social Gatherings with Friends and Family: Three times a week   Attends Religious Services: Never   Active Member of Clubs or Organizations: Yes   Attends Archivist Meetings: 1 to 4 times per year   Marital Status: Married  Human resources officer Violence: Not At Risk   Fear of Current or Ex-Partner: No   Emotionally Abused: No   Physically Abused: No   Sexually Abused: No    Outpatient Medications Prior to Visit  Medication Sig Dispense Refill   albuterol (PROVENTIL HFA;VENTOLIN HFA) 108 (90 Base) MCG/ACT inhaler Inhale 2 puffs into the lungs every 6 (six) hours as needed for wheezing. 54 g 3   amLODipine (NORVASC) 5 MG tablet Take 1 tablet (5 mg total) by mouth daily. 90 tablet 1   atenolol (TENORMIN) 100 MG tablet Take 1 tablet (100 mg total) by mouth 2 (two) times daily. 2 week supply while waiting on mail order 180 tablet 1   atorvastatin (LIPITOR) 20 MG tablet TAKE 1 TABLET BY MOUTH  DAILY 90 tablet 3   Budeson-Glycopyrrol-Formoterol (BREZTRI AEROSPHERE) 160-9-4.8 MCG/ACT AERO Inhale 1 Inhaler into the lungs in the morning and at bedtime.     Cholecalciferol (VITAMIN D3 PO) Take 1 capsule by mouth daily.     losartan (COZAAR) 100 MG tablet Take 1 tablet (100 mg total) by mouth daily. 90 tablet 3   MAGNESIUM PO Take 1 tablet by mouth daily.     omeprazole (PRILOSEC) 40 MG capsule TAKE 1 CAPSULE BY MOUTH  DAILY 90 capsule 3   QUEtiapine (SEROQUEL) 200 MG tablet TAKE 1 TABLET BY MOUTH AT  BEDTIME 90 tablet 3   Vibegron (GEMTESA) 75 MG TABS Take by mouth.     vitamin B-12  (CYANOCOBALAMIN) 100 MCG tablet Take 100 mcg by mouth daily.     FLUoxetine (PROZAC) 40 MG capsule Take 1 capsule (40 mg total) by mouth daily. 90 capsule 1   gabapentin (NEURONTIN) 100 MG capsule TAKE ONE CAPSULE BY MOUTH  EVERY MORNING, ONE CAPSULE  AT NOON, THEN THREE  CAPSULES AT BEDTIME 450 capsule 1   oxyCODONE-acetaminophen (PERCOCET/ROXICET) 5-325 MG tablet Take 1 tablet by mouth every 8 (eight) hours as needed  for severe pain. for pain 60 tablet 0   No facility-administered medications prior to visit.    Allergies  Allergen Reactions   Amoxicillin-Pot Clavulanate     REACTION: nausea and abdominal pain   Aspirin     REACTION: upset stomach   Codeine     REACTION: nausea   Hctz [Hydrochlorothiazide] Other (See Comments)    Hyponatremia   Nsaids Nausea Only   Other Nausea And Vomiting    ROS Review of Systems    Objective:    Physical Exam Constitutional:      Appearance: Normal appearance. She is well-developed.  HENT:     Head: Normocephalic and atraumatic.  Cardiovascular:     Rate and Rhythm: Normal rate and regular rhythm.     Heart sounds: Normal heart sounds.  Pulmonary:     Effort: Pulmonary effort is normal.     Breath sounds: Normal breath sounds.  Skin:    General: Skin is warm and dry.  Neurological:     Mental Status: She is alert and oriented to person, place, and time.  Psychiatric:        Behavior: Behavior normal.    BP 131/60   Pulse (!) 58   Ht 5\' 2"  (1.575 m)   Wt 158 lb (71.7 kg)   SpO2 98%   BMI 28.90 kg/m  Wt Readings from Last 3 Encounters:  03/16/21 158 lb (71.7 kg)  01/14/21 166 lb (75.3 kg)  08/22/20 168 lb (76.2 kg)     Health Maintenance Due  Topic Date Due   COVID-19 Vaccine (3 - Pfizer risk series) 08/13/2019    There are no preventive care reminders to display for this patient.  Lab Results  Component Value Date   TSH 2.42 09/29/2017   Lab Results  Component Value Date   WBC 6.3 03/17/2020   HGB 12.4  03/17/2020   HCT 35.7 03/17/2020   MCV 94.7 03/17/2020   PLT 233 03/17/2020   Lab Results  Component Value Date   NA 137 03/17/2020   K 4.6 03/17/2020   CO2 29 03/17/2020   GLUCOSE 98 03/17/2020   BUN 18 03/17/2020   CREATININE 0.94 (H) 03/17/2020   BILITOT 0.5 03/17/2020   ALKPHOS 58 12/09/2016   AST 20 03/17/2020   ALT 16 03/17/2020   PROT 6.6 03/17/2020   ALBUMIN 4.5 12/09/2016   CALCIUM 9.4 03/17/2020   Lab Results  Component Value Date   CHOL 136 03/17/2020   Lab Results  Component Value Date   HDL 65 03/17/2020   Lab Results  Component Value Date   LDLCALC 49 03/17/2020   Lab Results  Component Value Date   TRIG 135 03/17/2020   Lab Results  Component Value Date   CHOLHDL 2.1 03/17/2020   Lab Results  Component Value Date   HGBA1C 5.4 03/17/2020      Assessment & Plan:   Problem List Items Addressed This Visit       Cardiovascular and Mediastinum   HYPERTENSION, BENIGN ESSENTIAL - Primary    Well controlled. Continue current regimen. Follow up in  6 mo       Relevant Orders   Lipid Panel w/reflex Direct LDL   COMPLETE METABOLIC PANEL WITH GFR   CBC   TSH   Vitamin B1     Other   Major depressive disorder, single episode    She want to avoid medication tx at this time. Will monitor carefully.  Hyponatremia   Relevant Orders   COMPLETE METABOLIC PANEL WITH GFR   Encounter for chronic pain management    Indication for chronic opioid: chronic back pain, neuropathy, and fibromyalgia Medication and dose: oxycodone-acetaminophen 5-325 mg tab # pills per month: 60 Last UDS date: 03/2021 Opioid Treatment Agreement signed (Y/N): yes Opioid Treatment Agreement last reviewed with patient:  06/2020 NCCSRS reviewed this encounter (include red flags):   Yes- no red flags.  F/U : 2 months.       Relevant Medications   oxyCODONE-acetaminophen (PERCOCET/ROXICET) 5-325 MG tablet   Other Relevant Orders   Lipid Panel w/reflex Direct LDL    COMPLETE METABOLIC PANEL WITH GFR   CBC   TSH   Vitamin B1   DRUG MONITORING, PANEL 6 WITH CONFIRMATION, URINE   Chronic lower back pain   Relevant Medications   oxyCODONE-acetaminophen (PERCOCET/ROXICET) 5-325 MG tablet    Meds ordered this encounter  Medications   oxyCODONE-acetaminophen (PERCOCET/ROXICET) 5-325 MG tablet    Sig: Take 1 tablet by mouth every 8 (eight) hours as needed for severe pain. for pain    Dispense:  60 tablet    Refill:  0     Follow-up: Return in about 2 months (around 05/16/2021) for Pain Management .    Beatrice Lecher, MD

## 2021-03-16 NOTE — Assessment & Plan Note (Signed)
Well controlled. Continue current regimen. Follow up in  6 mo  

## 2021-03-17 ENCOUNTER — Encounter: Payer: Self-pay | Admitting: Family Medicine

## 2021-03-17 MED ORDER — OXYCODONE-ACETAMINOPHEN 5-325 MG PO TABS: 1.0000 | ORAL_TABLET | Freq: Three times a day (TID) | ORAL | 0 refills | Status: DC | PRN

## 2021-03-17 NOTE — Assessment & Plan Note (Signed)
Indication for chronic opioid:chronic back pain, neuropathy, and fibromyalgia Medication and dose:oxycodone-acetaminophen 5-325 mg tab # pills per month: 60 Last UDS date:03/2021 Opioid Treatment Agreement signed (Y/N):yes Opioid Treatment Agreement last reviewed with patient:06/2020 NCCSRS reviewed this encounter (include red flags):Yes- no red flags. F/U : 2 months.

## 2021-03-17 NOTE — Assessment & Plan Note (Signed)
She want to avoid medication tx at this time. Will monitor carefully.

## 2021-03-18 ENCOUNTER — Other Ambulatory Visit: Payer: Self-pay | Admitting: Family Medicine

## 2021-03-18 DIAGNOSIS — R32 Unspecified urinary incontinence: Secondary | ICD-10-CM

## 2021-03-18 DIAGNOSIS — N3281 Overactive bladder: Secondary | ICD-10-CM

## 2021-03-18 NOTE — Telephone Encounter (Signed)
[facility] requesting med refills for oxybutynin. Rx not listed in active med list.

## 2021-03-18 NOTE — Telephone Encounter (Signed)
Task completed. Pharmacy updated of provider's note. Automatic request will be cancelled.

## 2021-03-18 NOTE — Telephone Encounter (Signed)
She is on Gemtesa now for her bladder.  See if she is maybe wanting to switch back, or may be just the pharmacy sent the refill because it had been 6 months not sure.

## 2021-03-19 DIAGNOSIS — E871 Hypo-osmolality and hyponatremia: Secondary | ICD-10-CM | POA: Diagnosis not present

## 2021-03-19 DIAGNOSIS — G8929 Other chronic pain: Secondary | ICD-10-CM | POA: Diagnosis not present

## 2021-03-19 DIAGNOSIS — I10 Essential (primary) hypertension: Secondary | ICD-10-CM | POA: Diagnosis not present

## 2021-03-19 LAB — DRUG MONITORING, PANEL 6 WITH CONFIRMATION, URINE
6 Acetylmorphine: NEGATIVE ng/mL (ref <10)
Alcohol Metabolites: NEGATIVE ng/mL (ref <500)
Amphetamines: NEGATIVE ng/mL (ref <500)
Barbiturates: NEGATIVE ng/mL (ref <300)
Benzodiazepines: NEGATIVE ng/mL (ref <100)
Cocaine Metabolite: NEGATIVE ng/mL (ref <150)
Codeine: NEGATIVE ng/mL (ref <50)
Creatinine: 136.3 mg/dL (ref >=20.0)
Hydrocodone: NEGATIVE ng/mL (ref <50)
Hydromorphone: NEGATIVE ng/mL (ref <50)
Marijuana Metabolite: NEGATIVE ng/mL (ref <20)
Methadone Metabolite: NEGATIVE ng/mL (ref <100)
Morphine: NEGATIVE ng/mL (ref <50)
Norhydrocodone: NEGATIVE ng/mL (ref <50)
Noroxycodone: 1143 ng/mL — ABNORMAL HIGH (ref <50)
Opiates: NEGATIVE ng/mL (ref <100)
Oxidant: NEGATIVE ug/mL (ref <200)
Oxycodone: 452 ng/mL — ABNORMAL HIGH (ref <50)
Oxycodone: POSITIVE ng/mL — AB (ref <100)
Oxymorphone: NEGATIVE ng/mL (ref <50)
Phencyclidine: NEGATIVE ng/mL (ref <25)
pH: 5.9 (ref 4.5–9.0)

## 2021-03-19 LAB — DM TEMPLATE

## 2021-03-20 NOTE — Progress Notes (Signed)
Hi Annette Ramirez, cholesterol looks great.  Metabolic panel is normal.  Blood count is normal.  Thyroid level is off slightly at 4.8.  I would like to recheck that again in about 3 months.  Vitamin B1 is still pending.

## 2021-03-20 NOTE — Progress Notes (Signed)
I am not familiar with amlodipine affecting the liver.  She should be fine.  The most important thing is her diet to reduce the fat in her liver.

## 2021-03-24 LAB — LIPID PANEL W/REFLEX DIRECT LDL
Cholesterol: 154 mg/dL (ref <200)
HDL: 72 mg/dL (ref >=50)
LDL Cholesterol (Calc): 61 mg/dL (calc)
Non-HDL Cholesterol (Calc): 82 mg/dL (calc) (ref <130)
Total CHOL/HDL Ratio: 2.1 (calc) (ref <5.0)
Triglycerides: 130 mg/dL (ref <150)

## 2021-03-24 LAB — COMPLETE METABOLIC PANEL WITH GFR
AG Ratio: 2.1 (calc) (ref 1.0–2.5)
ALT: 10 U/L (ref 6–29)
AST: 16 U/L (ref 10–35)
Albumin: 4.6 g/dL (ref 3.6–5.1)
Alkaline phosphatase (APISO): 61 U/L (ref 37–153)
BUN: 13 mg/dL (ref 7–25)
CO2: 31 mmol/L (ref 20–32)
Calcium: 9.5 mg/dL (ref 8.6–10.4)
Chloride: 99 mmol/L (ref 98–110)
Creat: 0.84 mg/dL (ref 0.60–1.00)
Globulin: 2.2 g/dL (calc) (ref 1.9–3.7)
Glucose, Bld: 85 mg/dL (ref 65–99)
Potassium: 4.3 mmol/L (ref 3.5–5.3)
Sodium: 137 mmol/L (ref 135–146)
Total Bilirubin: 0.8 mg/dL (ref 0.2–1.2)
Total Protein: 6.8 g/dL (ref 6.1–8.1)
eGFR: 72 mL/min/{1.73_m2} (ref >=60)

## 2021-03-24 LAB — CBC
HCT: 39.5 % (ref 35.0–45.0)
Hemoglobin: 13.4 g/dL (ref 11.7–15.5)
MCH: 31.9 pg (ref 27.0–33.0)
MCHC: 33.9 g/dL (ref 32.0–36.0)
MCV: 94 fL (ref 80.0–100.0)
MPV: 11.7 fL (ref 7.5–12.5)
Platelets: 249 10*3/uL (ref 140–400)
RBC: 4.2 10*6/uL (ref 3.80–5.10)
RDW: 11.7 % (ref 11.0–15.0)
WBC: 7.5 10*3/uL (ref 3.8–10.8)

## 2021-03-24 LAB — VITAMIN B1: Vitamin B1 (Thiamine): 7 nmol/L — ABNORMAL LOW (ref 8–30)

## 2021-03-24 LAB — TSH: TSH: 4.87 mIU/L — ABNORMAL HIGH (ref 0.40–4.50)

## 2021-03-24 NOTE — Progress Notes (Signed)
Vitamin B 1 is low.  Recommend start OTC B1 , it is also called thiamine, and we can re heck the lab in 12 weeks.

## 2021-03-25 ENCOUNTER — Other Ambulatory Visit: Payer: Self-pay | Admitting: *Deleted

## 2021-03-25 DIAGNOSIS — E519 Thiamine deficiency, unspecified: Secondary | ICD-10-CM

## 2021-03-26 DIAGNOSIS — J449 Chronic obstructive pulmonary disease, unspecified: Secondary | ICD-10-CM | POA: Diagnosis not present

## 2021-04-06 ENCOUNTER — Other Ambulatory Visit: Payer: Self-pay | Admitting: Family Medicine

## 2021-04-06 ENCOUNTER — Other Ambulatory Visit: Payer: Self-pay | Admitting: *Deleted

## 2021-04-06 DIAGNOSIS — I10 Essential (primary) hypertension: Secondary | ICD-10-CM

## 2021-04-06 MED ORDER — ATENOLOL 100 MG PO TABS: 100.0000 mg | ORAL_TABLET | Freq: Two times a day (BID) | ORAL | 3 refills | Status: DC

## 2021-04-25 DIAGNOSIS — J449 Chronic obstructive pulmonary disease, unspecified: Secondary | ICD-10-CM | POA: Diagnosis not present

## 2021-05-11 ENCOUNTER — Other Ambulatory Visit: Payer: Self-pay

## 2021-05-11 DIAGNOSIS — G8929 Other chronic pain: Secondary | ICD-10-CM

## 2021-05-11 DIAGNOSIS — M545 Low back pain, unspecified: Secondary | ICD-10-CM

## 2021-05-11 MED ORDER — OXYCODONE-ACETAMINOPHEN 5-325 MG PO TABS: 1.0000 | ORAL_TABLET | Freq: Three times a day (TID) | ORAL | 0 refills | Status: DC | PRN

## 2021-05-18 ENCOUNTER — Other Ambulatory Visit: Payer: Self-pay

## 2021-05-18 ENCOUNTER — Ambulatory Visit (INDEPENDENT_AMBULATORY_CARE_PROVIDER_SITE_OTHER): Payer: Medicare Other | Admitting: Pharmacist

## 2021-05-18 DIAGNOSIS — J42 Unspecified chronic bronchitis: Secondary | ICD-10-CM

## 2021-05-18 DIAGNOSIS — I10 Essential (primary) hypertension: Secondary | ICD-10-CM

## 2021-05-18 DIAGNOSIS — E782 Mixed hyperlipidemia: Secondary | ICD-10-CM

## 2021-05-18 DIAGNOSIS — F324 Major depressive disorder, single episode, in partial remission: Secondary | ICD-10-CM

## 2021-05-18 NOTE — Progress Notes (Signed)
Chronic Care Management Pharmacy Note  05/19/2021 Name:  Annette Ramirez MRN:  [id number] DOB:  1945-12-06  Summary: Addressed HTN, HLD, COPD, mental health. Patient reports sub-optimal control with insomnia and mental health, though she prefers to manage without further medications at this time.  Recommendations/Changes made from today's visit: No changes, continue non-pharmacologic therapy/ sleep hygiene to manage insomnia and mental health at this time.  Plan: f/u with pharmacist in 6 months   Subjective: Annette Ramirez is an 76 y.o. year old female who is a primary patient of Metheney, Rene Kocher, MD.  The CCM team was consulted for assistance with disease management and care coordination needs.    Engaged with patient by telephone for follow up visit in response to provider referral for pharmacy case management and/or care coordination services.   Consent to Services:  The patient was given information about Chronic Care Management services, agreed to services, and gave verbal consent prior to initiation of services.  Please see initial visit note for detailed documentation.   Patient Care Team: Hali Marry, MD as PCP - General (Family Medicine) Darlis Loan, MD as Referring Physician (Gastroenterology) Darius Bump, [facility] as Pharmacist (Pharmacist)  Recent office visits:  11/17/20-[name] (Video visit) Seen for COVID positive. Start on molnupiravir EUA 200 mg CAPS.  10/28/20-[name] (PCP, Video visit) Seen for chronic pain management. Follow up in 2 months. 08/22/20-[name] (PCP) Seen for follow up visit for chronic pain management. Start on oxybutynin (DITROPAN-XL) 10 MG 24 hr tablet. Ambulatory referral to Urology. Follow up in 2 months.   Recent consult visits:  10/27/20-[name] (Pulmonology) Follow up visit. Start taking breztri inhaler 2 puffs twice daily. Follow up in 6 months. 09/24/20-Nichole A.  Rahenkamp (Urology) Consultation. Follow up in 3 months. 08/04/20-[name] (General Surgery) Follow up visit for plastic surgery.   Hospital visits:  None in previous 6 months    Objective:  Lab Results  Component Value Date   CREATININE 0.84 03/19/2021   CREATININE 0.94 (H) 03/17/2020   CREATININE 1.21 (H) 08/17/2019    Lab Results  Component Value Date   HGBA1C 5.4 03/17/2020       Component Value Date/Time   CHOL 154 03/19/2021 0000   TRIG 130 03/19/2021 0000   HDL 72 03/19/2021 0000   CHOLHDL 2.1 03/19/2021 0000   VLDL 19 05/29/2015 1024   LDLCALC 61 03/19/2021 0000    Hepatic Function Latest Ref Rng & Units 03/19/2021 03/17/2020 08/17/2019  Total Protein 6.1 - 8.1 g/dL 6.8 6.6 6.3  Albumin 3.6 - 5.1 g/dL - - -  AST 10 - 35 U/L _0 ALT 6 - 29 U/L _1 Alk Phosphatase 33 - 130 U/L - - -  Total Bilirubin 0.2 - 1.2 mg/dL 0.8 0.5 0.7    Lab Results  Component Value Date/Time   TSH 4.87 (H) 03/19/2021 12:00 AM   TSH 2.42 09/29/2017 02:27 PM   FREET4 1.3 10/14/2016 02:54 PM    CBC Latest Ref Rng & Units 03/19/2021 03/17/2020 08/17/2019  WBC 3.8 - 10.8 Thousand/uL 7.5 6.3 6.9  Hemoglobin 11.7 - 15.5 g/dL 13.4 12.4 11.9  Hematocrit 35.0 - 45.0 % 39.5 35.7 34.5(L)  Platelets 140 - 400 Thousand/uL 249 233 245    Clinical ASCVD: Yes  The 10-year ASCVD risk score (Arnett DK, et al., 2019) is: 21%   Values used to calculate the score:  Age: 14 years     Sex: Female     Is Non-Hispanic African American: No     Diabetic: No     Tobacco smoker: No     Systolic Blood Pressure: 967 mmHg     Is BP treated: Yes     HDL Cholesterol: 72 mg/dL     Total Cholesterol: 154 mg/dL     Social History   Tobacco Use  Smoking Status Former   Packs/day: 0.25   Years: 40.00   Pack years: 10.00   Types: Cigarettes   Quit date: 02/01/2020   Years since quitting: 1.2  Smokeless Tobacco Never  Tobacco Comments   02/28/2013 "started smoking eletronic  cigarettes a couple years ago"   BP Readings from Last 3 Encounters:  05/19/21 113/66  03/16/21 131/60  01/14/21 (!) 104/57   Pulse Readings from Last 3 Encounters:  05/19/21 67  03/16/21 (!) 58  01/14/21 67   Wt Readings from Last 3 Encounters:  05/19/21 160 lb (72.6 kg)  03/16/21 158 lb (71.7 kg)  01/14/21 166 lb (75.3 kg)    Assessment: Review of patient past medical history, allergies, medications, health status, including review of consultants reports, laboratory and other test data, was performed as part of comprehensive evaluation and provision of chronic care management services.   SDOH:  (Social Determinants of Health) assessments and interventions performed:    CCM Care Plan  Allergies  Allergen Reactions   Amoxicillin-Pot Clavulanate     REACTION: nausea and abdominal pain   Aspirin     REACTION: upset stomach   Codeine     REACTION: nausea   Hctz [Hydrochlorothiazide] Other (See Comments)    Hyponatremia   Nsaids Nausea Only   Other Nausea And Vomiting    Medications Reviewed Today     Reviewed by Hali Marry, MD (Physician) on 05/19/21 at 1455  Med List Status: <None>   Medication Order Taking? Sig Documenting Provider Last Dose Status Informant  albuterol (PROVENTIL HFA;VENTOLIN HFA) 108 (90 Base) MCG/ACT inhaler [id number] Yes Inhale 2 puffs into the lungs every 6 (six) hours as needed for wheezing. Hali Marry, MD Taking Active   amLODipine (NORVASC) 5 MG tablet [id number] Yes Take 1 tablet (5 mg total) by mouth daily. Hali Marry, MD Taking Active   atenolol (TENORMIN) 100 MG tablet [id number] Yes Take 1 tablet (100 mg total) by mouth 2 (two) times daily. Hali Marry, MD Taking Active   atorvastatin (LIPITOR) 20 MG tablet [id number] Yes TAKE 1 TABLET BY MOUTH  DAILY Hali Marry, MD Taking Active   Budeson-Glycopyrrol-Formoterol (BREZTRI AEROSPHERE) 160-9-4.8 MCG/ACT AERO [id number] Yes Inhale 1 Inhaler into  the lungs in the morning and at bedtime. [provider] Taking Active   Cholecalciferol (VITAMIN D3 PO) [id number] Yes Take 1 capsule by mouth daily. [provider] Taking Active   gabapentin (NEURONTIN) 100 MG capsule [id number] Yes TAKE ONE CAPSULE BY MOUTH  EVERY MORNING, ONE CAPSULE  AT NOON, THEN THREE  CAPSULES AT BEDTIME Hali Marry, MD Taking Active   losartan (COZAAR) 100 MG tablet [id number] Yes Take 1 tablet (100 mg total) by mouth daily. Hali Marry, MD Taking Active   MAGNESIUM PO [id number] Yes Take 1 tablet by mouth daily. [provider] Taking Active   omeprazole (PRILOSEC) 40 MG capsule [id number] Yes TAKE 1 CAPSULE BY MOUTH  DAILY Hali Marry, MD Taking Active   oxybutynin (DITROPAN-XL) 10 MG 24 hr tablet [id number] Yes  Take 10 mg by mouth at bedtime. [provider] Taking Active   oxyCODONE-acetaminophen (PERCOCET/ROXICET) 5-325 MG tablet [id number] Yes Take 1 tablet by mouth every 8 (eight) hours as needed for severe pain. for pain Hali Marry, MD Taking Active   QUEtiapine (SEROQUEL) 200 MG tablet [id number] Yes TAKE 1 TABLET BY MOUTH AT  BEDTIME Hali Marry, MD Taking Active   Vibegron [facility]) 75 MG TABS [id number]  Take by mouth. [provider]  Consider Medication Status and Discontinue (No longer needed (for PRN medications))   vitamin B-12 (CYANOCOBALAMIN) 100 MCG tablet [id number] Yes Take 100 mcg by mouth daily. [provider] Taking Active             Patient Active Problem List   Diagnosis Date Noted   Bilateral calf pain 01/14/2021   Urge incontinence of urine 10/28/2020   Weak urinary stream 08/22/2020   Grief 06/10/2020   Hypoxemia 10/03/2019   CKD (chronic kidney disease) stage 3, GFR 30-59 ml/min ([place]) 08/19/2019   Ductal carcinoma in situ (DCIS) of right breast 08/16/2019   Osteopenia 08/10/2019   Elevated triglycerides with high cholesterol 06/07/2019    OAB (overactive bladder) 04/19/2019   Opiate use 06/06/2017   Idiopathic peripheral neuropathy 01/06/2017   Hyponatremia 10/21/2016   Facet arthritis of lumbar region 07/13/2016   Encounter for chronic pain management 04/05/2016   Chronic lower back pain 03/15/2016   History of adenomatous polyp of colon 10/03/2015   Risk for coronary artery disease between 10% and 20% in next 10 years 05/30/2015   Arteriosclerosis of aorta ([place]) 03/17/2015   Thoracic neuritis 07/25/2012   COPD (chronic obstructive pulmonary disease) ([place]) 07/02/2010   Depression 02/13/2009   Major depressive disorder, single episode 02/13/2009   HEARING LOSS 09/25/2008   GERD 10/10/2007   INSOMNIA 03/21/2007   ANXIETY DISORDER, GENERALIZED 03/10/2006   TOBACCO ABUSE 03/10/2006   HYPERTENSION, BENIGN ESSENTIAL 03/10/2006   Fibromyalgia 03/10/2006   Osteoporosis 03/10/2006   Polymyositis ([place]) 03/10/2006    Immunization History  Administered Date(s) Administered   Fluad Quad(high Dose 65+) 04/19/2019, 01/09/2020, 02/01/2020, 01/14/2021   Influenza Split 03/22/2012   Influenza Whole 03/10/2006, 02/01/2008   Influenza, High Dose Seasonal PF 01/06/2017, 02/01/2018   Influenza,inj,Quad PF,6+ Mos 03/01/2013, 02/14/2014, 12/27/2014, 02/19/2016, 12/19/2017   Influenza-Unspecified 04/19/2019   PFIZER(Purple Top)SARS-COV-2 Vaccination 06/23/2019, 07/16/2019   Pneumococcal Conjugate-13 10/01/2013   Pneumococcal Polysaccharide-23 09/25/2008   Td 05/03/2004   Tdap 05/13/2015   Zoster, Live 05/30/2015    Conditions to be addressed/monitored: HTN, HLD, COPD, and Depression  Care Plan : Medication Management  Updates made by Darius Bump, [place] since 05/19/2021 12:00 AM     Problem: HTN, HLD, COPD, mental health      Long-Range Goal: Disease Progression Prevention   Start Date: 01/09/2021  Recent Progress: On track  Priority: High  Note:   Current Barriers:  None at present  Pharmacist Clinical Goal(s):   Over the next 180 days, patient will achieve control of chronic conditions as evidenced by medication fill history, lab values, and vital signs through collaboration with PharmD and provider.   Interventions: 1:1 collaboration with Hali Marry, MD regarding development and update of comprehensive plan of care as evidenced by provider attestation and co-signature Inter-disciplinary care team collaboration (see longitudinal plan of care) Comprehensive medication review performed; medication list updated in electronic medical record  Hypertension:  Controlled; current treatment:amlodipine 33m daily, losartan 1078mdaily, atenolol 10031mID;   Current home readings:  not currently checking  Denies hypotensive/hypertensive symptoms  Recommended continue current regimen,  Hyperlipidemia:  Controlled; current treatment:atorvastatin 73m daily; LDL 49   Recommended continue current regimen Chronic Obstructive Pulmonary Disease:  Controlled; current treatment:breztri BID, albuterol PRN;   0 exacerbations requiring treatment in the last 6 months   Recommended continue current regimen,  Depression/Anxiety:  Uncontrolled; current treatment:quetiapine 2073mnightly. Tried melatonin for insomnia & was ineffective ;   Connected with church grief counselor for mental health support  Recommended continue current medications and aim for non-pharmacologic therapy/sleep hygiene for insomnia and mental health support as she does not wish to modify medications.   Patient Goals/Self-Care Activities Over the next 180 days, patient will:  take medications as prescribed  Follow Up Plan: Telephone follow up appointment with care management team member scheduled for:  6 months      Medication Assistance:  TBD, investigating options for [place]in[place] assistance.  Patient's preferred pharmacy is:  [address]64[address]hone: [phone]: [phone] (OpCrescent CityCAPicture [facility] Ca[address]idalgo[address]hone: [phone]: [phone] #[id number] SerC [address] [place]ney[address] [place]0Kinder[place] [place]292493one: [phone]: [phone]. Elizabeth Covingtonlivery ([facility] ) - [address] [address]67[address]one: [phone]: [phone] pill box? Yes Pt endorses 100% compliance  Follow Up:  Patient agrees to Care Plan and Follow-up.  Plan: Telephone follow up appointment with care management team member scheduled for:  6 months  KeeLarinda ButteryharmD Clinical Pharmacist ConNorthwest Gastroenterology Clinic LLCim[phone]

## 2021-05-19 ENCOUNTER — Ambulatory Visit (INDEPENDENT_AMBULATORY_CARE_PROVIDER_SITE_OTHER): Payer: Medicare Other | Admitting: Family Medicine

## 2021-05-19 ENCOUNTER — Encounter: Payer: Self-pay | Admitting: Family Medicine

## 2021-05-19 VITALS — BP 113/66 | HR 67 | Resp 18 | Ht 62.0 in | Wt 160.0 lb

## 2021-05-19 DIAGNOSIS — I10 Essential (primary) hypertension: Secondary | ICD-10-CM

## 2021-05-19 DIAGNOSIS — G8929 Other chronic pain: Secondary | ICD-10-CM | POA: Diagnosis not present

## 2021-05-19 DIAGNOSIS — E519 Thiamine deficiency, unspecified: Secondary | ICD-10-CM

## 2021-05-19 DIAGNOSIS — G609 Hereditary and idiopathic neuropathy, unspecified: Secondary | ICD-10-CM

## 2021-05-19 DIAGNOSIS — M545 Low back pain, unspecified: Secondary | ICD-10-CM | POA: Diagnosis not present

## 2021-05-19 MED ORDER — OXYCODONE-ACETAMINOPHEN 5-325 MG PO TABS: 1.0000 | ORAL_TABLET | Freq: Three times a day (TID) | ORAL | 0 refills | Status: DC | PRN

## 2021-05-19 NOTE — Patient Instructions (Signed)
Increase your gabapentin to 2 tabs in the morning, 1 at noon and 3 at bedtime. After 1 week if you still feel like you need additional pain relief then you can increase her midday dose to 2 tabs as well.

## 2021-05-19 NOTE — Progress Notes (Signed)
Established Patient Office Visit  Subjective:  Patient ID: Annette Ramirez, female    DOB: 1945-10-24  Age: 76 y.o. MRN: [id number]  CC:  Chief Complaint  Patient presents with   Follow up     Pain Management    Discuss Medication    Patient would like to discuss restarting Amtriptyline to help with Nerve pain. Patient stated she stopped it due to dry mouth.     HPI Annette Ramirez presents for   Follow-up chronic pain management-she is currently on gabapentin 100 mg 1 in the morning, noon and then 3 at bedtime.  She was also previously on amitriptyline but we stopped the medication because it was causing some dry mouth.  She does take quetiapine 20 mg at bedtime.  She is wanting to consider restarting her amitriptyline to help with her nerve pain.  She did buy an over-the-counter topical with the money that Medicare allowed her to have.  She says that it actually really helped she had been applying it at bedtime but then ran out but she does get another $50 coming up this next quarter and plans to purchase it again.  She says she cannot quite remember the name of it.  She does take magnesium nightly for leg cramps.  She has had just extensive pain in her feet.  She did see podiatry probably over a year ago to asked them about the bunion but they did not recommend surgery at that time.  Past Medical History:  Diagnosis Date   Anxiety    Asthma    COPD (chronic obstructive pulmonary disease) ([place])    Depression    Ductal carcinoma in situ (DCIS) of right breast 08/16/2019   Exertional shortness of breath    Family history of anesthesia complication    "granddaughter has PONV" (02/28/2013)   Fibromyalgia    GERD (gastroesophageal reflux disease)    H/O hiatal hernia    Hypertension    Migraines    "in the past; they've stopped" (02/28/2013)   Opiate use 06/06/2017   Osteoporosis    PONV (postoperative nausea and vomiting)    Sinus headache     Past Surgical History:   Procedure Laterality Date   ABDOMINAL HYSTERECTOMY  1975   APPENDECTOMY  1975   BREAST CYST ASPIRATION Left 1980's   COLONOSCOPY W/ BIOPSIES AND POLYPECTOMY  2012   DILATION AND CURETTAGE OF UTERUS  1960's   EXCISIONAL HEMORRHOIDECTOMY  1980's   LUMBAR LAMINECTOMY  02/28/2013   LUMBAR LAMINECTOMY/DECOMPRESSION MICRODISCECTOMY N/A 02/28/2013   Procedure: LUMBAR LAMINECTOMY/DECOMPRESSION MICRODISCECTOMY/Lumbar 4-5 decompression;  Surgeon: Sinclair Ship, MD;  Location: [facility];  Service: Orthopedics;  Laterality: N/A;  Lumbar 4-5 decompression   TONSILLECTOMY  1950   TUBAL LIGATION  1972    Family History  Problem Relation Age of Onset   Alzheimer's disease Mother    Prostate cancer Other        grandfather   Stroke Other        grandparent    Social History   Socioeconomic History   Marital status: Married    Spouse name: [name]   Number of children: 2   Years of education: 13   Highest education level: Some college, no degree  Occupational History   Occupation: worked for Forensic psychologist    Comment: retired  Tobacco Use   Smoking status: Former    Packs/day: 0.25    Years: 40.00    Pack years: 10.00    Types:  Cigarettes    Quit date: 02/01/2020    Years since quitting: 1.2   Smokeless tobacco: Never   Tobacco comments:    02/28/2013 "started smoking eletronic cigarettes a couple years ago"  Vaping Use   Vaping Use: Never used  Substance and Sexual Activity   Alcohol use: Not Currently    Alcohol/week: 3.0 standard drinks    Types: 3 Glasses of wine per week    Comment: occasionally social   Drug use: No   Sexual activity: Not Currently  Other Topics Concern   Not on file  Social History Narrative   Lives with her husband. She recently lost her daughter and is in support group for grief. She enjoys cooking.    Social Determinants of Health   Financial Resource Strain: Low Risk    Difficulty of Paying Living Expenses: Not hard at all  Food Insecurity: No Food  Insecurity   Worried About Charity fundraiser in the Last Year: Never true   [place] in the Last Year: Never true  Transportation Needs: No Transportation Needs   Lack of Transportation (Medical): No   Lack of Transportation (Non-Medical): No  Physical Activity: Inactive   Days of Exercise per Week: 0 days   Minutes of Exercise per Session: 0 min  Stress: No Stress Concern Present   Feeling of Stress : Not at all  Social Connections: Moderately Integrated   Frequency of Communication with Friends and Family: More than three times a week   Frequency of Social Gatherings with Friends and Family: Three times a week   Attends Religious Services: Never   Active Member of Clubs or Organizations: Yes   Attends Archivist Meetings: 1 to 4 times per year   Marital Status: Married  Human resources officer Violence: Not At Risk   Fear of Current or Ex-Partner: No   Emotionally Abused: No   Physically Abused: No   Sexually Abused: No    Outpatient Medications Prior to Visit  Medication Sig Dispense Refill   albuterol (PROVENTIL HFA;VENTOLIN HFA) 108 (90 Base) MCG/ACT inhaler Inhale 2 puffs into the lungs every 6 (six) hours as needed for wheezing. 54 g 3   amLODipine (NORVASC) 5 MG tablet Take 1 tablet (5 mg total) by mouth daily. 90 tablet 1   atenolol (TENORMIN) 100 MG tablet Take 1 tablet (100 mg total) by mouth 2 (two) times daily. 180 tablet 3   atorvastatin (LIPITOR) 20 MG tablet TAKE 1 TABLET BY MOUTH  DAILY 90 tablet 3   Budeson-Glycopyrrol-Formoterol (BREZTRI AEROSPHERE) 160-9-4.8 MCG/ACT AERO Inhale 1 Inhaler into the lungs in the morning and at bedtime.     Cholecalciferol (VITAMIN D3 PO) Take 1 capsule by mouth daily.     gabapentin (NEURONTIN) 100 MG capsule TAKE ONE CAPSULE BY MOUTH  EVERY MORNING, ONE CAPSULE  AT NOON, THEN THREE  CAPSULES AT BEDTIME 450 capsule 1   losartan (COZAAR) 100 MG tablet Take 1 tablet (100 mg total) by mouth daily. 90 tablet 3   MAGNESIUM  PO Take 1 tablet by mouth daily.     omeprazole (PRILOSEC) 40 MG capsule TAKE 1 CAPSULE BY MOUTH  DAILY 90 capsule 3   oxybutynin (DITROPAN-XL) 10 MG 24 hr tablet Take 10 mg by mouth at bedtime.     QUEtiapine (SEROQUEL) 200 MG tablet TAKE 1 TABLET BY MOUTH AT  BEDTIME 90 tablet 3   vitamin B-12 (CYANOCOBALAMIN) 100 MCG tablet Take 100 mcg by mouth daily.  oxyCODONE-acetaminophen (PERCOCET/ROXICET) 5-325 MG tablet Take 1 tablet by mouth every 8 (eight) hours as needed for severe pain. for pain 60 tablet 0   Vibegron (GEMTESA) 75 MG TABS Take by mouth.     No facility-administered medications prior to visit.    Allergies  Allergen Reactions   Amoxicillin-Pot Clavulanate     REACTION: nausea and abdominal pain   Aspirin     REACTION: upset stomach   Codeine     REACTION: nausea   Hctz [Hydrochlorothiazide] Other (See Comments)    Hyponatremia   Nsaids Nausea Only   Other Nausea And Vomiting    ROS Review of Systems    Objective:    Physical Exam Constitutional:      Appearance: Normal appearance. She is well-developed.  HENT:     Head: Normocephalic and atraumatic.  Cardiovascular:     Rate and Rhythm: Normal rate and regular rhythm.     Heart sounds: Normal heart sounds.  Pulmonary:     Effort: Pulmonary effort is normal.     Breath sounds: Normal breath sounds.  Skin:    General: Skin is warm and dry.  Neurological:     Mental Status: She is alert and oriented to person, place, and time.  Psychiatric:        Behavior: Behavior normal.    BP 113/66    Pulse 67    Resp 18    Ht $R'5\' 2"'oN$  (1.575 m)    Wt 160 lb (72.6 kg)    SpO2 97%    BMI 29.26 kg/m  Wt Readings from Last 3 Encounters:  05/19/21 160 lb (72.6 kg)  03/16/21 158 lb (71.7 kg)  01/14/21 166 lb (75.3 kg)     There are no preventive care reminders to display for this patient.  There are no preventive care reminders to display for this patient.  Lab Results  Component Value Date   TSH 4.87 (H)  03/19/2021   Lab Results  Component Value Date   WBC 7.5 03/19/2021   HGB 13.4 03/19/2021   HCT 39.5 03/19/2021   MCV 94.0 03/19/2021   PLT 249 03/19/2021   Lab Results  Component Value Date   NA 137 03/19/2021   K 4.3 03/19/2021   CO2 31 03/19/2021   GLUCOSE 85 03/19/2021   BUN 13 03/19/2021   CREATININE 0.84 03/19/2021   BILITOT 0.8 03/19/2021   ALKPHOS 58 12/09/2016   AST 16 03/19/2021   ALT 10 03/19/2021   PROT 6.8 03/19/2021   ALBUMIN 4.5 12/09/2016   CALCIUM 9.5 03/19/2021   EGFR 72 03/19/2021   Lab Results  Component Value Date   CHOL 154 03/19/2021   Lab Results  Component Value Date   HDL 72 03/19/2021   Lab Results  Component Value Date   LDLCALC 61 03/19/2021   Lab Results  Component Value Date   TRIG 130 03/19/2021   Lab Results  Component Value Date   CHOLHDL 2.1 03/19/2021   Lab Results  Component Value Date   HGBA1C 5.4 03/17/2020      Assessment & Plan:   Problem List Items Addressed This Visit       Cardiovascular and Mediastinum   HYPERTENSION, BENIGN ESSENTIAL    Blood pressure is actually looks really great today.  In fact is a little bit on the lower end.  Last time she was here was in the 130s so we will keep a close eye on it if it stays down less  than 115 at the next visit then we might try decreasing her amlodipine to 2.5 mg.        Nervous and Auditory   Idiopathic peripheral neuropathy    We will try increasing the gabapentin as below.  We will just add an extra tab in the morning and then can add an extra tab midday if needed after 1 week.  Encouraged her to try to purchase the topical that she used before since it actually was helpful.  I like to try to avoid the amitriptyline just to keep her drug regimen more simple and less potential for complications but if we do need to consider adding it back we can.  We could also consider switching to Lyrica instead of the gabapentin.  We also have an option of Cymbalta.         Other   Encounter for chronic pain management - Primary    Indication for chronic opioid: chronic back pain, neuropathy, and fibromyalgia Medication and dose: oxycodone-acetaminophen 5-325 mg tab # pills per month: 60 Last UDS date: 05/2021 Opioid Treatment Agreement signed (Y/N): yes Opioid Treatment Agreement last reviewed with patient:  06/2020 NCCSRS reviewed this encounter (include red flags):   Yes- no red flags.  F/U : 2 months.      Relevant Medications   oxyCODONE-acetaminophen (PERCOCET/ROXICET) 5-325 MG tablet (Start on 06/14/2021)   Chronic lower back pain   Relevant Medications   oxyCODONE-acetaminophen (PERCOCET/ROXICET) 5-325 MG tablet (Start on 06/14/2021)   Other Visit Diagnoses     Thiamine deficiency, unspecified           Thiamine deficiency-due to recheck B1 today.   Meds ordered this encounter  Medications   oxyCODONE-acetaminophen (PERCOCET/ROXICET) 5-325 MG tablet    Sig: Take 1 tablet by mouth every 8 (eight) hours as needed for severe pain. for pain    Dispense:  60 tablet    Refill:  0    Follow-up: Return in about 2 months (around 07/17/2021) for Pain Mgt .    Beatrice Lecher, MD

## 2021-05-19 NOTE — Assessment & Plan Note (Signed)
Blood pressure is actually looks really great today.  In fact is a little bit on the lower end.  Last time she was here was in the 130s so we will keep a close eye on it if it stays down less than 115 at the next visit then we might try decreasing her amlodipine to 2.5 mg.

## 2021-05-19 NOTE — Patient Instructions (Signed)
Visit Information  Thank you for taking time to visit with me today. Please don't hesitate to contact me if I can be of assistance to you before our next scheduled telephone appointment.  Following are the goals we discussed today:  Patient Goals/Self-Care Activities Over the next 180 days, patient will:  take medications as prescribed  Follow Up Plan: Telephone follow up appointment with care management team member scheduled for:  6 months  Please call the care guide team at [phone] if you need to cancel or reschedule your appointment.   If you are experiencing a Mental Health or [place] or need someone to talk to, please call the Suicide and Crisis Lifeline: 988 call 1-800-273-TALK (toll free, 24 hour hotline)   Patient verbalizes understanding of instructions and care plan provided today and agrees to view in [place]. Active MyChart status confirmed with patient.    Annette Ramirez

## 2021-05-19 NOTE — Assessment & Plan Note (Signed)
Indication for chronic opioid:chronic back pain, neuropathy, and fibromyalgia Medication and dose:oxycodone-acetaminophen 5-325 mg tab # pills per month: 60 Last UDS date:05/2021 Opioid Treatment Agreement signed (Y/N):yes Opioid Treatment Agreement last reviewed with patient:06/2020 [facility] reviewed this encounter (include red flags):Yes- no red flags. F/U : 2 months.

## 2021-05-19 NOTE — Assessment & Plan Note (Signed)
We will try increasing the gabapentin as below.  We will just add an extra tab in the morning and then can add an extra tab midday if needed after 1 week.  Encouraged her to try to purchase the topical that she used before since it actually was helpful.  I like to try to avoid the amitriptyline just to keep her drug regimen more simple and less potential for complications but if we do need to consider adding it back we can.  We could also consider switching to Lyrica instead of the gabapentin.  We also have an option of Cymbalta.

## 2021-05-26 DIAGNOSIS — J449 Chronic obstructive pulmonary disease, unspecified: Secondary | ICD-10-CM | POA: Diagnosis not present

## 2021-06-02 ENCOUNTER — Other Ambulatory Visit: Payer: Self-pay | Admitting: Family Medicine

## 2021-06-02 DIAGNOSIS — E782 Mixed hyperlipidemia: Secondary | ICD-10-CM

## 2021-06-02 DIAGNOSIS — I10 Essential (primary) hypertension: Secondary | ICD-10-CM

## 2021-06-02 DIAGNOSIS — J42 Unspecified chronic bronchitis: Secondary | ICD-10-CM | POA: Diagnosis not present

## 2021-06-02 DIAGNOSIS — F324 Major depressive disorder, single episode, in partial remission: Secondary | ICD-10-CM

## 2021-06-03 NOTE — Telephone Encounter (Signed)
[facility] is requesting med refill for oxybutynin. Rx written by historical provider.

## 2021-06-18 DIAGNOSIS — H11823 Conjunctivochalasis, bilateral: Secondary | ICD-10-CM | POA: Diagnosis not present

## 2021-06-18 DIAGNOSIS — H02831 Dermatochalasis of right upper eyelid: Secondary | ICD-10-CM | POA: Diagnosis not present

## 2021-06-18 DIAGNOSIS — H02834 Dermatochalasis of left upper eyelid: Secondary | ICD-10-CM | POA: Diagnosis not present

## 2021-06-18 DIAGNOSIS — H25813 Combined forms of age-related cataract, bilateral: Secondary | ICD-10-CM | POA: Diagnosis not present

## 2021-06-18 DIAGNOSIS — H527 Unspecified disorder of refraction: Secondary | ICD-10-CM | POA: Diagnosis not present

## 2021-06-24 DIAGNOSIS — H35363 Drusen (degenerative) of macula, bilateral: Secondary | ICD-10-CM | POA: Diagnosis not present

## 2021-06-24 DIAGNOSIS — H11823 Conjunctivochalasis, bilateral: Secondary | ICD-10-CM | POA: Diagnosis not present

## 2021-06-24 DIAGNOSIS — H02831 Dermatochalasis of right upper eyelid: Secondary | ICD-10-CM | POA: Diagnosis not present

## 2021-06-24 DIAGNOSIS — H25813 Combined forms of age-related cataract, bilateral: Secondary | ICD-10-CM | POA: Diagnosis not present

## 2021-06-24 DIAGNOSIS — H527 Unspecified disorder of refraction: Secondary | ICD-10-CM | POA: Diagnosis not present

## 2021-06-24 DIAGNOSIS — H02834 Dermatochalasis of left upper eyelid: Secondary | ICD-10-CM | POA: Diagnosis not present

## 2021-06-26 DIAGNOSIS — J449 Chronic obstructive pulmonary disease, unspecified: Secondary | ICD-10-CM | POA: Diagnosis not present

## 2021-06-28 ENCOUNTER — Other Ambulatory Visit: Payer: Self-pay | Admitting: Family Medicine

## 2021-06-30 NOTE — Telephone Encounter (Signed)
On medication list as "historical provider" Previously prescribed by Dr. Madilyn Fireman but discontinued in June 2022 stating "ineffective"  Please advise on refill.

## 2021-07-02 DIAGNOSIS — I2510 Atherosclerotic heart disease of native coronary artery without angina pectoris: Secondary | ICD-10-CM | POA: Diagnosis not present

## 2021-07-02 DIAGNOSIS — H02831 Dermatochalasis of right upper eyelid: Secondary | ICD-10-CM | POA: Diagnosis not present

## 2021-07-02 DIAGNOSIS — H25812 Combined forms of age-related cataract, left eye: Secondary | ICD-10-CM | POA: Diagnosis not present

## 2021-07-02 DIAGNOSIS — K219 Gastro-esophageal reflux disease without esophagitis: Secondary | ICD-10-CM | POA: Diagnosis not present

## 2021-07-02 DIAGNOSIS — E785 Hyperlipidemia, unspecified: Secondary | ICD-10-CM | POA: Diagnosis not present

## 2021-07-02 DIAGNOSIS — K279 Peptic ulcer, site unspecified, unspecified as acute or chronic, without hemorrhage or perforation: Secondary | ICD-10-CM | POA: Diagnosis not present

## 2021-07-02 DIAGNOSIS — Z87891 Personal history of nicotine dependence: Secondary | ICD-10-CM | POA: Diagnosis not present

## 2021-07-02 DIAGNOSIS — H25813 Combined forms of age-related cataract, bilateral: Secondary | ICD-10-CM | POA: Diagnosis not present

## 2021-07-02 DIAGNOSIS — M797 Fibromyalgia: Secondary | ICD-10-CM | POA: Diagnosis not present

## 2021-07-02 DIAGNOSIS — H02834 Dermatochalasis of left upper eyelid: Secondary | ICD-10-CM | POA: Diagnosis not present

## 2021-07-02 DIAGNOSIS — J449 Chronic obstructive pulmonary disease, unspecified: Secondary | ICD-10-CM | POA: Diagnosis not present

## 2021-07-02 DIAGNOSIS — I10 Essential (primary) hypertension: Secondary | ICD-10-CM | POA: Diagnosis not present

## 2021-07-09 DIAGNOSIS — I10 Essential (primary) hypertension: Secondary | ICD-10-CM | POA: Diagnosis not present

## 2021-07-09 DIAGNOSIS — M797 Fibromyalgia: Secondary | ICD-10-CM | POA: Diagnosis not present

## 2021-07-09 DIAGNOSIS — H25813 Combined forms of age-related cataract, bilateral: Secondary | ICD-10-CM | POA: Diagnosis not present

## 2021-07-09 DIAGNOSIS — K219 Gastro-esophageal reflux disease without esophagitis: Secondary | ICD-10-CM | POA: Diagnosis not present

## 2021-07-09 DIAGNOSIS — E785 Hyperlipidemia, unspecified: Secondary | ICD-10-CM | POA: Diagnosis not present

## 2021-07-09 DIAGNOSIS — I2510 Atherosclerotic heart disease of native coronary artery without angina pectoris: Secondary | ICD-10-CM | POA: Diagnosis not present

## 2021-07-09 DIAGNOSIS — J449 Chronic obstructive pulmonary disease, unspecified: Secondary | ICD-10-CM | POA: Diagnosis not present

## 2021-07-09 DIAGNOSIS — F32A Depression, unspecified: Secondary | ICD-10-CM | POA: Diagnosis not present

## 2021-07-09 DIAGNOSIS — Z87891 Personal history of nicotine dependence: Secondary | ICD-10-CM | POA: Diagnosis not present

## 2021-07-09 DIAGNOSIS — M1990 Unspecified osteoarthritis, unspecified site: Secondary | ICD-10-CM | POA: Diagnosis not present

## 2021-07-09 DIAGNOSIS — K760 Fatty (change of) liver, not elsewhere classified: Secondary | ICD-10-CM | POA: Diagnosis not present

## 2021-07-09 DIAGNOSIS — H25811 Combined forms of age-related cataract, right eye: Secondary | ICD-10-CM | POA: Diagnosis not present

## 2021-07-17 ENCOUNTER — Other Ambulatory Visit: Payer: Self-pay

## 2021-07-17 ENCOUNTER — Encounter: Payer: Self-pay | Admitting: Family Medicine

## 2021-07-17 ENCOUNTER — Ambulatory Visit (INDEPENDENT_AMBULATORY_CARE_PROVIDER_SITE_OTHER): Payer: Medicare Other | Admitting: Family Medicine

## 2021-07-17 VITALS — BP 113/65 | HR 64 | Resp 18 | Ht 62.0 in | Wt 159.0 lb

## 2021-07-17 DIAGNOSIS — F324 Major depressive disorder, single episode, in partial remission: Secondary | ICD-10-CM

## 2021-07-17 DIAGNOSIS — M5414 Radiculopathy, thoracic region: Secondary | ICD-10-CM

## 2021-07-17 DIAGNOSIS — K219 Gastro-esophageal reflux disease without esophagitis: Secondary | ICD-10-CM

## 2021-07-17 DIAGNOSIS — M545 Low back pain, unspecified: Secondary | ICD-10-CM

## 2021-07-17 DIAGNOSIS — I10 Essential (primary) hypertension: Secondary | ICD-10-CM

## 2021-07-17 DIAGNOSIS — E785 Hyperlipidemia, unspecified: Secondary | ICD-10-CM

## 2021-07-17 DIAGNOSIS — F1190 Opioid use, unspecified, uncomplicated: Secondary | ICD-10-CM

## 2021-07-17 DIAGNOSIS — G8929 Other chronic pain: Secondary | ICD-10-CM | POA: Diagnosis not present

## 2021-07-17 DIAGNOSIS — M797 Fibromyalgia: Secondary | ICD-10-CM | POA: Diagnosis not present

## 2021-07-17 DIAGNOSIS — Z79899 Other long term (current) drug therapy: Secondary | ICD-10-CM | POA: Diagnosis not present

## 2021-07-17 MED ORDER — OXYCODONE-ACETAMINOPHEN 5-325 MG PO TABS: 1.0000 | ORAL_TABLET | Freq: Three times a day (TID) | ORAL | 0 refills | Status: DC | PRN

## 2021-07-17 MED ORDER — ATORVASTATIN CALCIUM 20 MG PO TABS: 20.0000 mg | ORAL_TABLET | Freq: Every day | ORAL | 3 refills | Status: DC

## 2021-07-17 MED ORDER — OMEPRAZOLE 40 MG PO CPDR: 40.0000 mg | DELAYED_RELEASE_CAPSULE | Freq: Every day | ORAL | 3 refills | Status: DC

## 2021-07-17 MED ORDER — ATENOLOL 100 MG PO TABS: 100.0000 mg | ORAL_TABLET | Freq: Two times a day (BID) | ORAL | 3 refills | Status: DC

## 2021-07-17 MED ORDER — AMLODIPINE BESYLATE 5 MG PO TABS: 5.0000 mg | ORAL_TABLET | Freq: Every day | ORAL | 3 refills | Status: DC

## 2021-07-17 MED ORDER — GABAPENTIN 100 MG PO CAPS: ORAL_CAPSULE | ORAL | 3 refills | Status: DC

## 2021-07-17 MED ORDER — LOSARTAN POTASSIUM 100 MG PO TABS: 100.0000 mg | ORAL_TABLET | Freq: Every day | ORAL | 3 refills | Status: DC

## 2021-07-17 MED ORDER — QUETIAPINE FUMARATE 200 MG PO TABS: 200.0000 mg | ORAL_TABLET | Freq: Every day | ORAL | 3 refills | Status: DC

## 2021-07-17 NOTE — Assessment & Plan Note (Signed)
Continue Seroquel. 

## 2021-07-17 NOTE — Progress Notes (Signed)
? ?Established Patient Office Visit ? ?Subjective:  ?Patient ID: Annette Ramirez, female    DOB: 12-23-1945  Age: 76 y.o. MRN: [id number] ? ?CC:  ?Chief Complaint  ?Patient presents with  ? Pain Management   ?  Follow up   ? Medication Refill  ?  Oxycodone on [employer]. All other prescriptions to [facility] mail order.   ? Hypertension  ?  Follow up   ? ? ?HPI ?Annette Ramirez presents for  ? ?Follow-up chronic pain management-she was on gabapentin 100 mg 1 in the morning, noon and then 3 at bedtime.  We increased her gabapentin in the mornings that she now takes 2.  Still takes 1 at noon, and 3 at bedtime.  We will need an updated [facility].  She was also previously on amitriptyline but we stopped the medication because it was causing some dry mouth.  She does take quetiapine 20 mg at bedtime.   She did buy an over-the-counter topical with the money that Medicare allowed her to have.  She says it has been helpful.  Is actually been taking a half a tab of her oxycodone at 10:00 and that seems to also be helping her rest better at night. ? ?Thiamine deficiency-she did not get a chance to get her blood work drawn since her last visit. ? ?Hypertension- Pt denies chest pain, SOB, dizziness, or heart palpitations.  Taking meds as directed w/o problems.  Denies medication side effects.   ? ?He just had cataracts surgery both eyes were done in the last couple of weeks and she has been doing really well she is still using the drops. ? ?Past Medical History:  ?Diagnosis Date  ? Anxiety   ? Asthma   ? COPD (chronic obstructive pulmonary disease) ([place])   ? Depression   ? Ductal carcinoma in situ (DCIS) of right breast 08/16/2019  ? Exertional shortness of breath   ? Family history of anesthesia complication   ? "granddaughter has PONV" (02/28/2013)  ? Fibromyalgia   ? GERD (gastroesophageal reflux disease)   ? H/O hiatal hernia   ? Hypertension   ? Migraines   ? "in the past; they've stopped"  (02/28/2013)  ? Opiate use 06/06/2017  ? Osteoporosis   ? PONV (postoperative nausea and vomiting)   ? Sinus headache   ? ? ?Past Surgical History:  ?Procedure Laterality Date  ? ABDOMINAL HYSTERECTOMY  1975  ? APPENDECTOMY  1975  ? BREAST CYST ASPIRATION Left 1980's  ? COLONOSCOPY W/ BIOPSIES AND POLYPECTOMY  2012  ? DILATION AND CURETTAGE OF UTERUS  1960's  ? EXCISIONAL HEMORRHOIDECTOMY  1980's  ? LUMBAR LAMINECTOMY  02/28/2013  ? LUMBAR LAMINECTOMY/DECOMPRESSION MICRODISCECTOMY N/A 02/28/2013  ? Procedure: LUMBAR LAMINECTOMY/DECOMPRESSION MICRODISCECTOMY/Lumbar 4-5 decompression;  Surgeon: Sinclair Ship, MD;  Location: [facility];  Service: Orthopedics;  Laterality: N/A;  Lumbar 4-5 decompression  ? TONSILLECTOMY  1950  ? TUBAL LIGATION  1972  ? ? ?Family History  ?Problem Relation Age of Onset  ? Alzheimer's disease Mother   ? Prostate cancer Other   ?     grandfather  ? Stroke Other   ?     grandparent  ? ? ?Social History  ? ?Socioeconomic History  ? Marital status: Married  ?  Spouse name: [name]  ? Number of children: 2  ? Years of education: 42  ? Highest education level: Some college, no degree  ?Occupational History  ? Occupation: worked for attorney  ?  Comment: retired  ?Tobacco Use  ? Smoking status: Former  ?  Packs/day: 0.25  ?  Years: 40.00  ?  Pack years: 10.00  ?  Types: Cigarettes  ?  Quit date: 02/01/2020  ?  Years since quitting: 1.4  ? Smokeless tobacco: Never  ? Tobacco comments:  ?  02/28/2013 "started smoking eletronic cigarettes a couple years ago"  ?Vaping Use  ? Vaping Use: Never used  ?Substance and Sexual Activity  ? Alcohol use: Not Currently  ?  Alcohol/week: 3.0 standard drinks  ?  Types: 3 Glasses of wine per week  ?  Comment: occasionally social  ? Drug use: No  ? Sexual activity: Not Currently  ?Other Topics Concern  ? Not on file  ?Social History Narrative  ? Lives with her husband. She recently lost her daughter and is in support group for grief. She enjoys cooking.   ? ?Social  Determinants of Health  ? ?Financial Resource Strain: Low Risk   ? Difficulty of Paying Living Expenses: Not hard at all  ?Food Insecurity: No Food Insecurity  ? Worried About Charity fundraiser in the Last Year: Never true  ? Ran Out of Food in the Last Year: Never true  ?Transportation Needs: No Transportation Needs  ? Lack of Transportation (Medical): No  ? Lack of Transportation (Non-Medical): No  ?Physical Activity: Inactive  ? Days of Exercise per Week: 0 days  ? Minutes of Exercise per Session: 0 min  ?Stress: No Stress Concern Present  ? Feeling of Stress : Not at all  ?Social Connections: Moderately Integrated  ? Frequency of Communication with Friends and Family: More than three times a week  ? Frequency of Social Gatherings with Friends and Family: Three times a week  ? Attends Religious Services: Never  ? Active Member of Clubs or Organizations: Yes  ? Attends Archivist Meetings: 1 to 4 times per year  ? Marital Status: Married  ?Intimate Partner Violence: Not At Risk  ? Fear of Current or Ex-Partner: No  ? Emotionally Abused: No  ? Physically Abused: No  ? Sexually Abused: No  ? ? ?Outpatient Medications Prior to Visit  ?Medication Sig Dispense Refill  ? albuterol (PROVENTIL HFA;VENTOLIN HFA) 108 (90 Base) MCG/ACT inhaler Inhale 2 puffs into the lungs every 6 (six) hours as needed for wheezing. 54 g 3  ? Budeson-Glycopyrrol-Formoterol (BREZTRI AEROSPHERE) 160-9-4.8 MCG/ACT AERO Inhale 1 Inhaler into the lungs in the morning and at bedtime.    ? Cholecalciferol (VITAMIN D3 PO) Take 1 capsule by mouth daily.    ? MAGNESIUM PO Take 1 tablet by mouth daily.    ? oxybutynin (DITROPAN-XL) 10 MG 24 hr tablet TAKE 1 TABLET BY MOUTH AT  BEDTIME 90 tablet 3  ? vitamin B-12 (CYANOCOBALAMIN) 100 MCG tablet Take 100 mcg by mouth daily.    ? amLODipine (NORVASC) 5 MG tablet Take 1 tablet (5 mg total) by mouth daily. 90 tablet 1  ? atenolol (TENORMIN) 100 MG tablet Take 1 tablet (100 mg total) by mouth 2  (two) times daily. 180 tablet 3  ? atorvastatin (LIPITOR) 20 MG tablet TAKE 1 TABLET BY MOUTH  DAILY 90 tablet 3  ? gabapentin (NEURONTIN) 100 MG capsule TAKE ONE CAPSULE BY MOUTH  EVERY MORNING, ONE CAPSULE  AT NOON, THEN THREE  CAPSULES AT BEDTIME 450 capsule 1  ? losartan (COZAAR) 100 MG tablet Take 1 tablet (100 mg total) by mouth daily. 90 tablet 3  ?  omeprazole (PRILOSEC) 40 MG capsule TAKE 1 CAPSULE BY MOUTH  DAILY 90 capsule 3  ? oxyCODONE-acetaminophen (PERCOCET/ROXICET) 5-325 MG tablet Take 1 tablet by mouth every 8 (eight) hours as needed for severe pain. for pain 60 tablet 0  ? QUEtiapine (SEROQUEL) 200 MG tablet TAKE 1 TABLET BY MOUTH AT  BEDTIME 90 tablet 3  ? ?No facility-administered medications prior to visit.  ? ? ?Allergies  ?Allergen Reactions  ? Amoxicillin-Pot Clavulanate   ?  REACTION: nausea and abdominal pain  ? Aspirin   ?  REACTION: upset stomach  ? Codeine   ?  REACTION: nausea  ? Hctz [Hydrochlorothiazide] Other (See Comments)  ?  Hyponatremia  ? Nsaids Nausea Only  ? Other Nausea And Vomiting  ? ? ?ROS ?Review of Systems ? ?  ?Objective:  ?  ?Physical Exam ?Constitutional:   ?   Appearance: Normal appearance. She is well-developed.  ?HENT:  ?   Head: Normocephalic and atraumatic.  ?Cardiovascular:  ?   Rate and Rhythm: Normal rate and regular rhythm.  ?   Heart sounds: Normal heart sounds.  ?Pulmonary:  ?   Effort: Pulmonary effort is normal.  ?   Breath sounds: Normal breath sounds.  ?Skin: ?   General: Skin is warm and dry.  ?Neurological:  ?   Mental Status: She is alert and oriented to person, place, and time.  ?Psychiatric:     ?   Behavior: Behavior normal.  ? ? ?BP 113/65   Pulse 64   Resp 18   Ht '5\' 2"'$  (1.575 m)   Wt 159 lb (72.1 kg)   SpO2 97%   BMI 29.08 kg/m?  ?Wt Readings from Last 3 Encounters:  ?07/17/21 159 lb (72.1 kg)  ?05/19/21 160 lb (72.6 kg)  ?03/16/21 158 lb (71.7 kg)  ? ? ? ?Health Maintenance Due  ?Topic Date Due  ? COVID-19 Vaccine (3 - Pfizer risk series)  08/13/2019  ? ? ?There are no preventive care reminders to display for this patient. ? ?Lab Results  ?Component Value Date  ? TSH 4.87 (H) 03/19/2021  ? ?Lab Results  ?Component Value Date  ? WBC 7.5 03/19/2021

## 2021-07-17 NOTE — Assessment & Plan Note (Signed)
Well controlled. Continue current regimen. Follow up in  6 mo  

## 2021-07-17 NOTE — Assessment & Plan Note (Signed)
Refilled medication for 1 year. ?

## 2021-07-19 LAB — DRUG MONITORING, PANEL 6 WITH CONFIRMATION, URINE
6 Acetylmorphine: NEGATIVE ng/mL
Alcohol Metabolites: NEGATIVE ng/mL
Amphetamines: NEGATIVE ng/mL
Barbiturates: NEGATIVE ng/mL
Benzodiazepines: NEGATIVE ng/mL
Cocaine Metabolite: NEGATIVE ng/mL
Codeine: NEGATIVE ng/mL
Creatinine: 53.1 mg/dL
Hydrocodone: NEGATIVE ng/mL
Hydromorphone: NEGATIVE ng/mL
Marijuana Metabolite: NEGATIVE ng/mL
Methadone Metabolite: NEGATIVE ng/mL
Morphine: NEGATIVE ng/mL
Norhydrocodone: NEGATIVE ng/mL
Noroxycodone: 668 ng/mL — ABNORMAL HIGH
Opiates: NEGATIVE ng/mL
Oxidant: NEGATIVE ug/mL
Oxycodone: 449 ng/mL — ABNORMAL HIGH
Oxycodone: POSITIVE ng/mL — AB
Oxymorphone: 213 ng/mL — ABNORMAL HIGH
Phencyclidine: NEGATIVE ng/mL
pH: 5.5 (ref 4.5–9.0)

## 2021-07-19 LAB — DM TEMPLATE

## 2021-07-24 DIAGNOSIS — J449 Chronic obstructive pulmonary disease, unspecified: Secondary | ICD-10-CM | POA: Diagnosis not present

## 2021-08-05 DIAGNOSIS — Z9013 Acquired absence of bilateral breasts and nipples: Secondary | ICD-10-CM | POA: Diagnosis not present

## 2021-08-05 DIAGNOSIS — D0511 Intraductal carcinoma in situ of right breast: Secondary | ICD-10-CM | POA: Diagnosis not present

## 2021-09-16 DIAGNOSIS — C50911 Malignant neoplasm of unspecified site of right female breast: Secondary | ICD-10-CM | POA: Diagnosis not present

## 2021-09-16 DIAGNOSIS — C50912 Malignant neoplasm of unspecified site of left female breast: Secondary | ICD-10-CM | POA: Diagnosis not present

## 2021-09-17 ENCOUNTER — Ambulatory Visit (INDEPENDENT_AMBULATORY_CARE_PROVIDER_SITE_OTHER): Payer: Medicare Other | Admitting: Family Medicine

## 2021-09-17 ENCOUNTER — Encounter: Payer: Self-pay | Admitting: Family Medicine

## 2021-09-17 VITALS — BP 118/63 | HR 65 | Resp 16 | Ht 62.0 in | Wt 154.0 lb

## 2021-09-17 DIAGNOSIS — M545 Low back pain, unspecified: Secondary | ICD-10-CM | POA: Diagnosis not present

## 2021-09-17 DIAGNOSIS — I10 Essential (primary) hypertension: Secondary | ICD-10-CM | POA: Diagnosis not present

## 2021-09-17 DIAGNOSIS — G8929 Other chronic pain: Secondary | ICD-10-CM | POA: Diagnosis not present

## 2021-09-17 MED ORDER — OXYCODONE-ACETAMINOPHEN 5-325 MG PO TABS: ORAL_TABLET | ORAL | 0 refills | Status: DC

## 2021-09-17 NOTE — Assessment & Plan Note (Addendum)
She is really not taking a full 2 tabs daily.  So we can adjust her prescription so it reflects closer to how she is actually taking the medication.  Also discussed that we send in 1 prescription for May and 1 prescription for June and that they do not have the same prescription number social actually have to call and speak to the pharmacist to get the refill for June.  Indication for chronic opioid:chronic back pain, neuropathy, and fibromyalgia Medication and dose:oxycodone-acetaminophen 5-325 mg tab # pills per month: 45 Last UDS date: 08/2021 Opioid Treatment Agreement signed (Y/N):yes Opioid Treatment Agreement last reviewed with patient:08/2021 [place] reviewed this encounter (include red flags):Yes- no red flags. F/U : 2-3 months.

## 2021-09-17 NOTE — Assessment & Plan Note (Signed)
Well controlled. Continue current regimen. Follow up in  6 mo . Due for BMP 

## 2021-09-17 NOTE — Progress Notes (Signed)
Established Patient Office Visit  Subjective   Patient ID: Annette Ramirez, female    DOB: [birth year]  Age: 76 y.o. MRN: [id number]  Chief Complaint  Patient presents with   Follow-up    Chronic Pain Management.     HPI  Follow-up chronic pain management.  On gabapentin, 2 in the morning, 1 at noon and 3 at bedtime.  She does take oxycodone.  She says she usually takes a whole tab in the morning and a half a tab in the afternoon but if she is feeling well she will sometimes get the half a tab in the afternoon.  She actually did not fill her April prescription just the March 1.  Due for updated pain contract.  She still struggling with sleep but says her grandson actually got her a patch to wear.  She is not sure what set it but it has been helping her sleep.  She does not think it is anything illegal.    Hypertension- Pt denies chest pain, SOB, dizziness, or heart palpitations.  Taking meds as directed w/o problems.  Denies medication side effects.       ROS    Objective:     BP 118/63   Pulse 65   Resp 16   Ht $R'5\' 2"'pT$  (1.575 m)   Wt 154 lb (69.9 kg)   SpO2 95%   BMI 28.17 kg/m    Physical Exam Vitals and nursing note reviewed.  Constitutional:      Appearance: She is well-developed.  HENT:     Head: Normocephalic and atraumatic.  Cardiovascular:     Rate and Rhythm: Normal rate and regular rhythm.     Heart sounds: Normal heart sounds.  Pulmonary:     Effort: Pulmonary effort is normal.     Breath sounds: Normal breath sounds.  Skin:    General: Skin is warm and dry.  Neurological:     Mental Status: She is alert and oriented to person, place, and time.  Psychiatric:        Behavior: Behavior normal.     Results for orders placed or performed in visit on 01/23/29  BASIC METABOLIC PANEL WITH GFR  Result Value Ref Range   Glucose, Bld 107 (H) 65 - 99 mg/dL   BUN 12 7 - 25 mg/dL   Creat 0.97 0.60 - 1.00 mg/dL   eGFR 60 > OR = 60 mL/min/1.82m2    BUN/Creatinine Ratio NOT APPLICABLE 6 - 22 (calc)   Sodium 135 135 - 146 mmol/L   Potassium 4.6 3.5 - 5.3 mmol/L   Chloride 99 98 - 110 mmol/L   CO2 27 20 - 32 mmol/L   Calcium 9.5 8.6 - 10.4 mg/dL      The 10-year ASCVD risk score (Arnett DK, et al., 2019) is: 25.5%    Assessment & Plan:   Problem List Items Addressed This Visit       Cardiovascular and Mediastinum   HYPERTENSION, BENIGN ESSENTIAL    Well controlled. Continue current regimen. Follow up in  6 mo . Due for BMP       Relevant Orders   BASIC METABOLIC PANEL WITH GFR (Completed)     Other   Encounter for chronic pain management - Primary    She is really not taking a full 2 tabs daily.  So we can adjust her prescription so it reflects closer to how she is actually taking the medication.  Also discussed that we send in 1  prescription for May and 1 prescription for June and that they do not have the same prescription number social actually have to call and speak to the pharmacist to get the refill for June.  Indication for chronic opioid: chronic back pain, neuropathy, and fibromyalgia Medication and dose: oxycodone-acetaminophen 5-325 mg tab # pills per month: 45 Last UDS date: 08/2021 Opioid Treatment Agreement signed (Y/N): yes Opioid Treatment Agreement last reviewed with patient:  08/2021 [facility] reviewed this encounter (include red flags):   Yes- no red flags.  F/U : 2-3 months.       Relevant Medications   oxyCODONE-acetaminophen (PERCOCET/ROXICET) 5-325 MG tablet (Start on 10/17/2021)   oxyCODONE-acetaminophen (PERCOCET/ROXICET) 5-325 MG tablet   Other Relevant Orders   DRUG MONITORING, PANEL 6 WITH CONFIRMATION, URINE   Chronic lower back pain   Relevant Medications   oxyCODONE-acetaminophen (PERCOCET/ROXICET) 5-325 MG tablet (Start on 10/17/2021)   oxyCODONE-acetaminophen (PERCOCET/ROXICET) 5-325 MG tablet   Other Relevant Orders   DRUG MONITORING, PANEL 6 WITH CONFIRMATION, URINE    Return in  about 3 months (around 12/18/2021) for chronic pain management .    Beatrice Lecher, MD

## 2021-09-18 LAB — BASIC METABOLIC PANEL WITH GFR
BUN: 12 mg/dL (ref 7–25)
CO2: 27 mmol/L (ref 20–32)
Calcium: 9.5 mg/dL (ref 8.6–10.4)
Chloride: 99 mmol/L (ref 98–110)
Creat: 0.97 mg/dL (ref 0.60–1.00)
Glucose, Bld: 107 mg/dL — ABNORMAL HIGH (ref 65–99)
Potassium: 4.6 mmol/L (ref 3.5–5.3)
Sodium: 135 mmol/L (ref 135–146)
eGFR: 60 mL/min/{1.73_m2} (ref >=60)

## 2021-09-18 NOTE — Progress Notes (Signed)
Your lab work is within acceptable range and there are no concerning findings.   ?

## 2021-09-19 LAB — DRUG MONITORING, PANEL 6 WITH CONFIRMATION, URINE
6 Acetylmorphine: NEGATIVE ng/mL (ref <10)
Alcohol Metabolites: NEGATIVE ng/mL (ref <500)
Amphetamines: NEGATIVE ng/mL (ref <500)
Barbiturates: NEGATIVE ng/mL (ref <300)
Benzodiazepines: NEGATIVE ng/mL (ref <100)
Cocaine Metabolite: NEGATIVE ng/mL (ref <150)
Codeine: NEGATIVE ng/mL (ref <50)
Creatinine: 141.4 mg/dL (ref >=20.0)
Hydrocodone: NEGATIVE ng/mL (ref <50)
Hydromorphone: NEGATIVE ng/mL (ref <50)
Marijuana Metabolite: NEGATIVE ng/mL (ref <20)
Methadone Metabolite: NEGATIVE ng/mL (ref <100)
Morphine: NEGATIVE ng/mL (ref <50)
Norhydrocodone: NEGATIVE ng/mL (ref <50)
Noroxycodone: 1451 ng/mL — ABNORMAL HIGH (ref <50)
Opiates: NEGATIVE ng/mL (ref <100)
Oxidant: NEGATIVE ug/mL (ref <200)
Oxycodone: 759 ng/mL — ABNORMAL HIGH (ref <50)
Oxycodone: POSITIVE ng/mL — AB (ref <100)
Oxymorphone: 1085 ng/mL — ABNORMAL HIGH (ref <50)
Phencyclidine: NEGATIVE ng/mL (ref <25)
pH: 6.1 (ref 4.5–9.0)

## 2021-09-19 LAB — DM TEMPLATE

## 2021-09-26 ENCOUNTER — Other Ambulatory Visit: Payer: Self-pay | Admitting: Family Medicine

## 2021-09-26 DIAGNOSIS — I10 Essential (primary) hypertension: Secondary | ICD-10-CM

## 2021-11-04 ENCOUNTER — Other Ambulatory Visit: Payer: Self-pay | Admitting: Family Medicine

## 2021-11-04 DIAGNOSIS — G8929 Other chronic pain: Secondary | ICD-10-CM

## 2021-11-04 DIAGNOSIS — M545 Low back pain, unspecified: Secondary | ICD-10-CM

## 2021-11-04 NOTE — Telephone Encounter (Signed)
Pt called.  She needs a refill on her pain med.  She as an appointment scheduled on July 27th,

## 2021-11-04 NOTE — Telephone Encounter (Signed)
There was one on file for June 17.  She already picked that up?  If she did then she really should be good until closer to July 16.

## 2021-11-05 NOTE — Telephone Encounter (Signed)
Annette Ramirez called and left a message stating she will have enough to last until July 16 th. She just wanted the prescription on file for July 16 th.

## 2021-11-05 NOTE — Telephone Encounter (Signed)
LVM advising pt of recommendations. 

## 2021-11-09 MED ORDER — OXYCODONE-ACETAMINOPHEN 5-325 MG PO TABS: ORAL_TABLET | ORAL | 0 refills | Status: DC

## 2021-11-09 NOTE — Telephone Encounter (Signed)
Meds ordered this encounter  Medications   oxyCODONE-acetaminophen (PERCOCET/ROXICET) 5-325 MG tablet    Sig: 1 tab po QAM and 1/2 in afternoon    Dispense:  45 tablet    Refill:  0

## 2021-11-16 ENCOUNTER — Ambulatory Visit (INDEPENDENT_AMBULATORY_CARE_PROVIDER_SITE_OTHER): Payer: Medicare Other | Admitting: Pharmacist

## 2021-11-16 DIAGNOSIS — E785 Hyperlipidemia, unspecified: Secondary | ICD-10-CM

## 2021-11-16 DIAGNOSIS — I10 Essential (primary) hypertension: Secondary | ICD-10-CM

## 2021-11-16 DIAGNOSIS — J42 Unspecified chronic bronchitis: Secondary | ICD-10-CM

## 2021-11-16 NOTE — Progress Notes (Signed)
Chronic Care Management Pharmacy Note  11/17/2021 Name:  Annette Ramirez MRN:  [id number] DOB:  1945/08/27  Summary: Addressed HTN, HLD, COPD, mental health. Patient reports sub-optimal control with insomnia. Completed medication review as well as detailed lifestyle/routine.   Recommendations/Changes made from today's visit: No changes, counseled and reinforced non-pharmacologic therapy/ sleep hygiene to manage insomnia.  Plan: f/u with pharmacist in 1 yr  Subjective: Annette Ramirez is an 76 y.o. year old female who is a primary patient of Metheney, Rene Kocher, MD.  The CCM team was consulted for assistance with disease management and care coordination needs.    Engaged with patient by telephone for follow up visit in response to provider referral for pharmacy case management and/or care coordination services.   Consent to Services:  The patient was given information about Chronic Care Management services, agreed to services, and gave verbal consent prior to initiation of services.  Please see initial visit note for detailed documentation.   Patient Care Team: Hali Marry, MD as PCP - General (Family Medicine) Darlis Loan, MD as Referring Physician (Gastroenterology) Darius Bump, [facility] as Pharmacist (Pharmacist)  Recent office visits:  11/17/20-[name] (Video visit) Seen for COVID positive. Start on molnupiravir EUA 200 mg CAPS.  10/28/20-[name] (PCP, Video visit) Seen for chronic pain management. Follow up in 2 months. 08/22/20-[name] (PCP) Seen for follow up visit for chronic pain management. Start on oxybutynin (DITROPAN-XL) 10 MG 24 hr tablet. Ambulatory referral to Urology. Follow up in 2 months.   Recent consult visits:  10/27/20-[name] (Pulmonology) Follow up visit. Start taking breztri inhaler 2 puffs twice daily. Follow up in 6 months. 09/24/20-[name] (Urology) Consultation. Follow  up in 3 months. 08/04/20-[name] (General Surgery) Follow up visit for plastic surgery.   Hospital visits:  None in previous 6 months    Objective:  Lab Results  Component Value Date   CREATININE 0.97 09/17/2021   CREATININE 0.84 03/19/2021   CREATININE 0.94 (H) 03/17/2020    Lab Results  Component Value Date   HGBA1C 5.4 03/17/2020       Component Value Date/Time   CHOL 154 03/19/2021 0000   TRIG 130 03/19/2021 0000   HDL 72 03/19/2021 0000   CHOLHDL 2.1 03/19/2021 0000   VLDL 19 05/29/2015 1024   LDLCALC 61 03/19/2021 0000       Latest Ref Rng & Units 03/19/2021   12:00 AM 03/17/2020   12:00 AM 08/17/2019    3:28 PM  Hepatic Function  Total Protein 6.1 - 8.1 g/dL 6.8  6.6  6.3   AST 10 - 35 U/L '16  20  23   '$ ALT 6 - 29 U/L '10  16  14   '$ Total Bilirubin 0.2 - 1.2 mg/dL 0.8  0.5  0.7     Lab Results  Component Value Date/Time   TSH 4.87 (H) 03/19/2021 12:00 AM   TSH 2.42 09/29/2017 02:27 PM   FREET4 1.3 10/14/2016 02:54 PM       Latest Ref Rng & Units 03/19/2021   12:00 AM 03/17/2020   12:00 AM 08/17/2019    3:28 PM  CBC  WBC 3.8 - 10.8 Thousand/uL 7.5  6.3  6.9   Hemoglobin 11.7 - 15.5 g/dL 13.4  12.4  11.9   Hematocrit 35.0 - 45.0 % 39.5  35.7  34.5   Platelets 140 - 400 Thousand/uL 249  233  245  Clinical ASCVD: Yes  The 10-year ASCVD risk score (Arnett DK, et al., 2019) is: 25.5%   Values used to calculate the score:     Age: 48 years     Sex: Female     Is Non-Hispanic African American: No     Diabetic: No     Tobacco smoker: No     Systolic Blood Pressure: 119 mmHg     Is BP treated: Yes     HDL Cholesterol: 72 mg/dL     Total Cholesterol: 154 mg/dL     Social History   Tobacco Use  Smoking Status Former   Packs/day: 0.25   Years: 40.00   Total pack years: 10.00   Types: Cigarettes   Quit date: 02/01/2020   Years since quitting: 1.7  Smokeless Tobacco Former   BP Readings from Last 3 Encounters:  09/17/21 118/63   07/17/21 113/65  05/19/21 113/66   Pulse Readings from Last 3 Encounters:  09/17/21 65  07/17/21 64  05/19/21 67   Wt Readings from Last 3 Encounters:  09/17/21 154 lb (69.9 kg)  07/17/21 159 lb (72.1 kg)  05/19/21 160 lb (72.6 kg)    Assessment: Review of patient past medical history, allergies, medications, health status, including review of consultants reports, laboratory and other test data, was performed as part of comprehensive evaluation and provision of chronic care management services.   SDOH:  (Social Determinants of Health) assessments and interventions performed:    CCM Care Plan  Allergies  Allergen Reactions   Amoxicillin-Pot Clavulanate     REACTION: nausea and abdominal pain   Aspirin     REACTION: upset stomach   Codeine     REACTION: nausea   Hctz [Hydrochlorothiazide] Other (See Comments)    Hyponatremia   Nsaids Nausea Only   Other Nausea And Vomiting    Medications Reviewed Today     Reviewed by Darius Bump, [facility] (Pharmacist) on 11/16/21 at [place] List Status: <None>   Medication Order Taking? Sig Documenting Provider Last Dose Status Informant  albuterol (PROVENTIL HFA;VENTOLIN HFA) 108 (90 Base) MCG/ACT inhaler [id number] Yes Inhale 2 puffs into the lungs every 6 (six) hours as needed for wheezing. Hali Marry, MD Taking Active   amLODipine (NORVASC) 5 MG tablet [id number] Yes Take 1 tablet (5 mg total) by mouth daily. Hali Marry, MD Taking Active   atenolol (TENORMIN) 100 MG tablet [id number] Yes Take 1 tablet (100 mg total) by mouth 2 (two) times daily. Hali Marry, MD Taking Active   atorvastatin (LIPITOR) 20 MG tablet [id number] Yes Take 1 tablet (20 mg total) by mouth daily. Hali Marry, MD Taking Active   Budeson-Glycopyrrol-Formoterol [facility] AEROSPHERE) 160-9-4.8 MCG/ACT Hollie Salk [id number] Yes Inhale 1 Inhaler into the lungs in the morning and at bedtime. [provider] Taking Active    Cholecalciferol (VITAMIN D3 PO) [id number] Yes Take 1 capsule by mouth daily. [provider] Taking Active   gabapentin (NEURONTIN) 100 MG capsule [id number] Yes TAKE TWO CAPSULES BY MOUTH  EVERY MORNING, ONE CAPSULE  AT NOON, THEN THREE  CAPSULES AT BEDTIME Hali Marry, MD Taking Active   losartan (COZAAR) 100 MG tablet [id number] Yes Take 1 tablet (100 mg total) by mouth daily. Hali Marry, MD Taking Active   MAGNESIUM PO [id number] Yes Take 1 tablet by mouth daily. [provider] Taking Active   omeprazole (PRILOSEC) 40 MG capsule [id number] Yes Take 1 capsule (40 mg total) by mouth daily.  Hali Marry, MD Taking Active   oxybutynin (DITROPAN-XL) 10 MG 24 hr tablet [id number] Yes TAKE 1 TABLET BY MOUTH AT  BEDTIME Hali Marry, MD Taking Active   oxyCODONE-acetaminophen (PERCOCET/ROXICET) 5-325 MG tablet [id number] Yes 1 tab po QAM and 1/2 in afternoon Hali Marry, MD Taking Active   oxyCODONE-acetaminophen (PERCOCET/ROXICET) 5-325 MG tablet [id number]  1 tab po QAM and 1/2 in afternoon Hali Marry, MD  Active   QUEtiapine (SEROQUEL) 200 MG tablet [id number] Yes Take 1 tablet (200 mg total) by mouth at bedtime. Hali Marry, MD Taking Active   vitamin B-12 (CYANOCOBALAMIN) 100 MCG tablet [id number] Yes Take 100 mcg by mouth daily. [provider] Taking Active             Patient Active Problem List   Diagnosis Date Noted   Bilateral calf pain 01/14/2021   Urge incontinence of urine 10/28/2020   Weak urinary stream 08/22/2020   Grief 06/10/2020   Hypoxemia 10/03/2019   CKD (chronic kidney disease) stage 3, GFR 30-59 ml/min ([place]) 08/19/2019   Ductal carcinoma in situ (DCIS) of right breast 08/16/2019   Osteopenia 08/10/2019   Elevated triglycerides with high cholesterol 06/07/2019   OAB (overactive bladder) 04/19/2019   Opiate use 06/06/2017   Idiopathic peripheral neuropathy 01/06/2017    Hyponatremia 10/21/2016   Facet arthritis of lumbar region 07/13/2016   Encounter for chronic pain management 04/05/2016   Chronic lower back pain 03/15/2016   History of adenomatous polyp of colon 10/03/2015   Risk for coronary artery disease between 10% and 20% in next 10 years 05/30/2015   Arteriosclerosis of aorta ([place]) 03/17/2015   Thoracic neuritis 07/25/2012   COPD (chronic obstructive pulmonary disease) ([place]) 07/02/2010   Depression 02/13/2009   Major depressive disorder, single episode 02/13/2009   HEARING LOSS 09/25/2008   GERD 10/10/2007   INSOMNIA 03/21/2007   ANXIETY DISORDER, GENERALIZED 03/10/2006   TOBACCO ABUSE 03/10/2006   HYPERTENSION, BENIGN ESSENTIAL 03/10/2006   Fibromyalgia 03/10/2006   Osteoporosis 03/10/2006   Polymyositis ([place]) 03/10/2006    Immunization History  Administered Date(s) Administered   Fluad Quad(high Dose 65+) 04/19/2019, 01/09/2020, 02/01/2020, 01/14/2021   Influenza Split 03/22/2012   Influenza Whole 03/10/2006, 02/01/2008   Influenza, High Dose Seasonal PF 01/06/2017, 02/01/2018   Influenza,inj,Quad PF,6+ Mos 03/01/2013, 02/14/2014, 12/27/2014, 02/19/2016, 12/19/2017   Influenza-Unspecified 04/19/2019   PFIZER(Purple Top)SARS-COV-2 Vaccination 06/23/2019, 07/16/2019   Pneumococcal Conjugate-13 10/01/2013   Pneumococcal Polysaccharide-23 09/25/2008   Td 05/03/2004   Tdap 05/13/2015   Zoster, Live 05/30/2015    Conditions to be addressed/monitored: HTN, HLD, COPD, and Depression  Care Plan : Medication Management  Updates made by Darius Bump, [place] since 11/17/2021 12:00 AM     Problem: HTN, HLD, COPD, mental health      Long-Range Goal: Disease Progression Prevention   Start Date: 01/09/2021  Recent Progress: On track  Priority: High  Note:   Current Barriers:  None at present  Pharmacist Clinical Goal(s):  Over the next 365 days, patient will achieve control of chronic conditions as evidenced by medication fill  history, lab values, and vital signs through collaboration with PharmD and provider.   Interventions: 1:1 collaboration with Hali Marry, MD regarding development and update of comprehensive plan of care as evidenced by provider attestation and co-signature Inter-disciplinary care team collaboration (see longitudinal plan of care) Comprehensive medication review performed; medication list updated in electronic medical record  Hypertension:  Controlled; current treatment:amlodipine '5mg'$  daily, losartan '100mg'$   daily, atenolol '100mg'$  BID;   Current home readings: not currently checking  Denies hypotensive/hypertensive symptoms  Recommended continue current regimen,  Hyperlipidemia:  Controlled; current treatment:atorvastatin '20mg'$  daily;   Recommended continue current regimen Chronic Obstructive Pulmonary Disease:  Controlled; current treatment:breztri BID, albuterol PRN;   0 exacerbations requiring treatment in the last 6 months   Recommended continue current regimen,  Depression/Anxiety:  controlled; current treatment:quetiapine '200mg'$  nightly. Tried melatonin for insomnia & was ineffective ;   Connected with church grief counselor for mental health support  Recommended continue current medications and aim for non-pharmacologic therapy/sleep hygiene for insomnia and mental health support as she does not wish to modify medications.   Patient Goals/Self-Care Activities Over the next 365 days, patient will:  take medications as prescribed  Follow Up Plan: Telephone follow up appointment with care management team member scheduled for: 1 yr       Medication Assistance:  TBD, investigating options for Judithann Sauger and [place] financial assistance.  Patient's preferred pharmacy is:  [place], [place] - [address] [place] [address] Phone: [phone] Fax: [phone]  [facility] - [address], [address] [place] [address] Phone: [phone] Fax: [phone]   Uses pill box? Yes Pt endorses 100% compliance  Follow Up:  Patient agrees to Care Plan and Follow-up.  Plan: Telephone follow up appointment with care management team member scheduled for:  1 yr  Larinda Buttery, PharmD Clinical Pharmacist [facility] [phone]

## 2021-11-17 NOTE — Patient Instructions (Signed)
Visit Information  Thank you for taking time to visit with me today. Please don't hesitate to contact me if I can be of assistance to you before our next scheduled telephone appointment.  Following are the goals we discussed today:   Patient Goals/Self-Care Activities Over the next 365 days, patient will:  take medications as prescribed, engage in sleep hygiene for reducing insomnia  Follow Up Plan: Telephone follow up appointment with care management team member scheduled for: 1 yr  Please call the care guide team at [phone] if you need to cancel or reschedule your appointment.   If you are experiencing a Mental Health or [place] or need someone to talk to, please call the Suicide and Crisis Lifeline: 988 call 1-800-273-TALK (toll free, 24 hour hotline)   Patient verbalizes understanding of instructions and care plan provided today and agrees to view in [place]. Active MyChart status and patient understanding of how to access instructions and care plan via MyChart confirmed with patient.     Darius Bump

## 2021-11-26 ENCOUNTER — Ambulatory Visit: Payer: Medicare Other | Admitting: Family Medicine

## 2021-11-30 DIAGNOSIS — I10 Essential (primary) hypertension: Secondary | ICD-10-CM

## 2021-11-30 DIAGNOSIS — J42 Unspecified chronic bronchitis: Secondary | ICD-10-CM | POA: Diagnosis not present

## 2021-11-30 DIAGNOSIS — E785 Hyperlipidemia, unspecified: Secondary | ICD-10-CM

## 2021-12-18 ENCOUNTER — Ambulatory Visit: Payer: Medicare Other | Admitting: Family Medicine

## 2021-12-21 ENCOUNTER — Other Ambulatory Visit: Payer: Self-pay

## 2021-12-21 DIAGNOSIS — G8929 Other chronic pain: Secondary | ICD-10-CM

## 2021-12-21 MED ORDER — OXYCODONE-ACETAMINOPHEN 5-325 MG PO TABS: ORAL_TABLET | ORAL | 0 refills | Status: DC

## 2021-12-21 NOTE — Telephone Encounter (Signed)
Pt called stating that she did come to the office last week for her appointment, but was informed at check in that her appointment had been canceled by herself.  Pt states that she did NOT cancel this appointment.  She has been rescheduled for 12/30/21.  However, she has to attend her grandson's wedding this weekend and is in a lot of pain and states that she really needs her medication refilled before her appointment on the 30th.  Last OV was 09/17/21 and last RX for oxycodone was 11/15/21 for #45.  Please advise.  Charyl Bigger, CMA

## 2021-12-30 ENCOUNTER — Ambulatory Visit: Payer: Medicare Other | Admitting: Family Medicine

## 2022-01-25 ENCOUNTER — Ambulatory Visit: Payer: Medicare Other | Admitting: Family Medicine

## 2022-01-27 ENCOUNTER — Encounter: Payer: Self-pay | Admitting: Family Medicine

## 2022-01-27 ENCOUNTER — Ambulatory Visit (INDEPENDENT_AMBULATORY_CARE_PROVIDER_SITE_OTHER): Payer: Medicare Other | Admitting: Family Medicine

## 2022-01-27 VITALS — BP 128/67 | HR 69 | Ht 62.0 in | Wt 147.0 lb

## 2022-01-27 DIAGNOSIS — G8929 Other chronic pain: Secondary | ICD-10-CM | POA: Diagnosis not present

## 2022-01-27 DIAGNOSIS — F324 Major depressive disorder, single episode, in partial remission: Secondary | ICD-10-CM | POA: Diagnosis not present

## 2022-01-27 DIAGNOSIS — D0511 Intraductal carcinoma in situ of right breast: Secondary | ICD-10-CM | POA: Diagnosis not present

## 2022-01-27 DIAGNOSIS — F330 Major depressive disorder, recurrent, mild: Secondary | ICD-10-CM

## 2022-01-27 DIAGNOSIS — M545 Low back pain, unspecified: Secondary | ICD-10-CM

## 2022-01-27 DIAGNOSIS — Z23 Encounter for immunization: Secondary | ICD-10-CM | POA: Diagnosis not present

## 2022-01-27 DIAGNOSIS — I10 Essential (primary) hypertension: Secondary | ICD-10-CM

## 2022-01-27 MED ORDER — QUETIAPINE FUMARATE 150 MG PO TABS: 150.0000 mg | ORAL_TABLET | Freq: Every day | ORAL | 0 refills | Status: DC

## 2022-01-27 MED ORDER — OXYCODONE-ACETAMINOPHEN 5-325 MG PO TABS: ORAL_TABLET | ORAL | 0 refills | Status: DC

## 2022-01-27 NOTE — Assessment & Plan Note (Signed)
BP at goal today.

## 2022-01-27 NOTE — Progress Notes (Signed)
Established Patient Office Visit  Subjective   Patient ID: Annette Ramirez, female    DOB: May 10, 1945  Age: 76 y.o. MRN: [id number]  Chief Complaint  Patient presents with   Pain Management    HPI  Chronic pain management -currently taking whole tab oxycodone in the morning and half a tab in the afternoon.  She said she really like to be over take 1 in the evenings.  Previously when she had a little more flexibility with her tabs she would sometimes take 1 at night and really just felt like she rested better.  She says a lot of days when she gets done cooking dinner she will have to lay down before she can eat.  And then by the time she gets up from resting to eat she does not even feel like eating anymore.  Been under some additional stressors.  Her grandson got married in August which was wonderful but was a little bittersweet because her daughter who would be the mother of the Phillip Heal has passed away.  Also her sister-in-law fell and broke her hip and ended up having a massive stroke and then going on hospice.  Taking  her magnesium supplement has really been helpful for cramping and neuralgia.     ROS    Objective:     BP 128/67   Pulse 69   Ht '5\' 2"'$  (1.575 m)   Wt 147 lb (66.7 kg)   SpO2 99%   BMI 26.89 kg/m    Physical Exam Vitals and nursing note reviewed.  Constitutional:      Appearance: She is well-developed.  HENT:     Head: Normocephalic and atraumatic.  Cardiovascular:     Rate and Rhythm: Normal rate and regular rhythm.     Heart sounds: Normal heart sounds.  Pulmonary:     Effort: Pulmonary effort is normal.     Breath sounds: Normal breath sounds.  Skin:    General: Skin is warm and dry.  Neurological:     Mental Status: She is alert and oriented to person, place, and time.  Psychiatric:        Behavior: Behavior normal.      No results found for any visits on 01/27/22.    The 10-year ASCVD risk score (Arnett DK, et al., 2019) is:  29.4%    Assessment & Plan:   Problem List Items Addressed This Visit       Cardiovascular and Mediastinum   HYPERTENSION, BENIGN ESSENTIAL    BP at goal today.         Other   MDD (major depressive disorder), recurrent episode, mild ([place])    Discussed options.  If were going to add a half a tab of oxycodone add like to try to taper down on the quetiapine if possible we will just need to watch mood carefully.  She was using it for mood as well as poor sleep quality.  So we will drop from 200 mg down to 150 mg.  If she is doing really well on that dose then we will continue to taper down as tolerated to the least amount with for the most effect.      Relevant Medications   QUEtiapine 150 MG TABS   Encounter for chronic pain management - Primary    Indication for chronic opioid: chronic back pain, neuropathy, and fibromyalgia Medication and dose: oxycodone-acetaminophen 5-325 mg tab # pills per month: 60 Last UDS date: 01/27/2022 Opioid Treatment Agreement signed (  Y/N): yes Opioid Treatment Agreement last reviewed with patient:  08/2021 [facility] reviewed this encounter (include red flags):   Yes- no red flags.  F/U : 3 months.      Relevant Medications   oxyCODONE-acetaminophen (PERCOCET/ROXICET) 5-325 MG tablet   oxyCODONE-acetaminophen (PERCOCET/ROXICET) 5-325 MG tablet (Start on 02/25/2022)   oxyCODONE-acetaminophen (PERCOCET/ROXICET) 5-325 MG tablet (Start on 03/27/2022)   Other Relevant Orders   DRUG MONITORING, PANEL 6 WITH CONFIRMATION, URINE   Ductal carcinoma in situ (DCIS) of right breast    Some discomfort and swelling from the lymphedema so she says she is can plan on try to get back in with the oncologist soon just to follow-up.  She has had bilateral mastectomies so long no longer needs mammogram.      RESOLVED: Depression   Relevant Medications   QUEtiapine 150 MG TABS   Chronic lower back pain   Relevant Medications   oxyCODONE-acetaminophen (PERCOCET/ROXICET)  5-325 MG tablet   oxyCODONE-acetaminophen (PERCOCET/ROXICET) 5-325 MG tablet (Start on 02/25/2022)   oxyCODONE-acetaminophen (PERCOCET/ROXICET) 5-325 MG tablet (Start on 03/27/2022)   Other Visit Diagnoses     Need for pneumococcal 20-valent conjugate vaccination       Relevant Orders   Pneumococcal conjugate vaccine 20-valent (Prevnar 20) (Completed)   Need for immunization against influenza       Relevant Orders   Flu Vaccine QUAD High Dose(Fluad) (Completed)        Discussed RSV vaccine   Return in about 3 months (around 04/28/2022) for chronic pain management .    Beatrice Lecher, MD

## 2022-01-27 NOTE — Assessment & Plan Note (Signed)
Discussed options.  If were going to add a half a tab of oxycodone add like to try to taper down on the quetiapine if possible we will just need to watch mood carefully.  She was using it for mood as well as poor sleep quality.  So we will drop from 200 mg down to 150 mg.  If she is doing really well on that dose then we will continue to taper down as tolerated to the least amount with for the most effect.

## 2022-01-27 NOTE — Patient Instructions (Signed)
Make sure to fast when you come in for your next visit in 3 months we will do some updated blood work that day.

## 2022-01-27 NOTE — Assessment & Plan Note (Addendum)
Indication for chronic opioid:chronic back pain, neuropathy, and fibromyalgia Medication and dose:oxycodone-acetaminophen 5-325 mg tab # pills per month: 60 Last UDS date: 01/27/2022 Opioid Treatment Agreement signed (Y/N):yes Opioid Treatment Agreement last reviewed with patient:08/2021 [place] reviewed this encounter (include red flags):Yes- no red flags. F/U : 3 months.

## 2022-01-27 NOTE — Assessment & Plan Note (Signed)
Some discomfort and swelling from the lymphedema so she says she is can plan on try to get back in with the oncologist soon just to follow-up.  She has had bilateral mastectomies so long no longer needs mammogram.

## 2022-01-29 LAB — DRUG MONITORING, PANEL 6 WITH CONFIRMATION, URINE
6 Acetylmorphine: NEGATIVE ng/mL (ref <10)
Alcohol Metabolites: NEGATIVE ng/mL (ref <500)
Amphetamines: NEGATIVE ng/mL (ref <500)
Barbiturates: NEGATIVE ng/mL (ref <300)
Benzodiazepines: NEGATIVE ng/mL (ref <100)
Cocaine Metabolite: NEGATIVE ng/mL (ref <150)
Codeine: NEGATIVE ng/mL (ref <50)
Creatinine: 76.7 mg/dL (ref >=20.0)
Hydrocodone: NEGATIVE ng/mL (ref <50)
Hydromorphone: NEGATIVE ng/mL (ref <50)
Marijuana Metabolite: NEGATIVE ng/mL (ref <20)
Methadone Metabolite: NEGATIVE ng/mL (ref <100)
Morphine: NEGATIVE ng/mL (ref <50)
Norhydrocodone: NEGATIVE ng/mL (ref <50)
Noroxycodone: 1182 ng/mL — ABNORMAL HIGH (ref <50)
Opiates: NEGATIVE ng/mL (ref <100)
Oxidant: NEGATIVE ug/mL (ref <200)
Oxycodone: 977 ng/mL — ABNORMAL HIGH (ref <50)
Oxycodone: POSITIVE ng/mL — AB (ref <100)
Oxymorphone: 887 ng/mL — ABNORMAL HIGH (ref <50)
Phencyclidine: NEGATIVE ng/mL (ref <25)
pH: 6 (ref 4.5–9.0)

## 2022-01-29 LAB — DM TEMPLATE

## 2022-02-23 ENCOUNTER — Other Ambulatory Visit: Payer: Self-pay | Admitting: Family Medicine

## 2022-03-11 DIAGNOSIS — R1319 Other dysphagia: Secondary | ICD-10-CM | POA: Diagnosis not present

## 2022-03-11 DIAGNOSIS — R634 Abnormal weight loss: Secondary | ICD-10-CM | POA: Diagnosis not present

## 2022-03-15 DIAGNOSIS — R1310 Dysphagia, unspecified: Secondary | ICD-10-CM | POA: Diagnosis not present

## 2022-03-15 DIAGNOSIS — K2950 Unspecified chronic gastritis without bleeding: Secondary | ICD-10-CM | POA: Diagnosis not present

## 2022-03-15 DIAGNOSIS — R634 Abnormal weight loss: Secondary | ICD-10-CM | POA: Diagnosis not present

## 2022-03-15 DIAGNOSIS — K222 Esophageal obstruction: Secondary | ICD-10-CM | POA: Diagnosis not present

## 2022-03-20 ENCOUNTER — Other Ambulatory Visit: Payer: Self-pay | Admitting: Family Medicine

## 2022-03-20 DIAGNOSIS — M797 Fibromyalgia: Secondary | ICD-10-CM

## 2022-03-21 DIAGNOSIS — M79641 Pain in right hand: Secondary | ICD-10-CM | POA: Diagnosis not present

## 2022-03-21 DIAGNOSIS — M25531 Pain in right wrist: Secondary | ICD-10-CM | POA: Diagnosis not present

## 2022-03-22 ENCOUNTER — Other Ambulatory Visit: Payer: Self-pay | Admitting: Family Medicine

## 2022-03-22 DIAGNOSIS — E785 Hyperlipidemia, unspecified: Secondary | ICD-10-CM

## 2022-03-22 DIAGNOSIS — K219 Gastro-esophageal reflux disease without esophagitis: Secondary | ICD-10-CM

## 2022-03-24 DIAGNOSIS — M79641 Pain in right hand: Secondary | ICD-10-CM | POA: Diagnosis not present

## 2022-03-29 ENCOUNTER — Telehealth: Payer: Self-pay

## 2022-03-29 DIAGNOSIS — G8929 Other chronic pain: Secondary | ICD-10-CM

## 2022-03-29 DIAGNOSIS — M545 Low back pain, unspecified: Secondary | ICD-10-CM

## 2022-03-29 NOTE — Telephone Encounter (Signed)
Rather just call a different pharmacy and see if we can call it in for the month.  Because she actually takes a half a tab twice a day which means she is actually have to quarter the 10 mg which is not going to work out we will hold.

## 2022-03-29 NOTE — Telephone Encounter (Signed)
Annette Ramirez called and left a message stating the pharmacy doesn't have the 5 mg of the Oxycodone. They do have the 10 mg. She wanted to know if Dr Madilyn Fireman could send in the 10 mg and she cut the tablet in half.

## 2022-03-30 ENCOUNTER — Other Ambulatory Visit (HOSPITAL_BASED_OUTPATIENT_CLINIC_OR_DEPARTMENT_OTHER): Payer: Self-pay

## 2022-03-30 MED ORDER — OXYCODONE-ACETAMINOPHEN 5-325 MG PO TABS
2.0000 | ORAL_TABLET | Freq: Every day | ORAL | 0 refills | Status: DC
  Filled 2022-03-30: qty 60, 30d supply, fill #0

## 2022-03-30 NOTE — Telephone Encounter (Signed)
Meds ordered this encounter  Medications   oxyCODONE-acetaminophen (PERCOCET/ROXICET) 5-325 MG tablet    Sig: 1 tab po QAM and 1/2 in afternoon and 1/2 at bedtime    Dispense:  60 tablet    Refill:  0

## 2022-03-30 NOTE — Telephone Encounter (Signed)
[facility] pharmacy has the dose per patient. Pended prescription and pharmacy.

## 2022-03-30 NOTE — Telephone Encounter (Signed)
Patient advised. She will call other pharmacies and call us back.

## 2022-04-05 ENCOUNTER — Ambulatory Visit (INDEPENDENT_AMBULATORY_CARE_PROVIDER_SITE_OTHER): Payer: Medicare Other | Admitting: Family Medicine

## 2022-04-05 DIAGNOSIS — Z0000 Encounter for general adult medical examination without abnormal findings: Secondary | ICD-10-CM | POA: Diagnosis not present

## 2022-04-05 NOTE — Patient Instructions (Addendum)
[place] Maintenance Summary and Written Plan of Care  Ms. Basnett ,  Thank you for allowing me to perform your Medicare Annual Wellness Visit and for your ongoing commitment to your health.   Health Maintenance & Immunization History Health Maintenance  Topic Date Due   COVID-19 Vaccine (3 - Pfizer risk series) 02/13/2023 (Originally 08/13/2019)   Zoster Vaccines- Shingrix (1 of 2) 04/29/2023 (Originally 07/25/1964)   COLONOSCOPY (Pts 45-73yr Insurance coverage will need to be confirmed)  05/22/2022   Medicare Annual Wellness (AWV)  04/06/2023   DTaP/Tdap/Td (3 - Td or Tdap) 05/12/2025   Pneumonia Vaccine 78 Years old  Completed   INFLUENZA VACCINE  Completed   Hepatitis C Screening  Completed   HPV VACCINES  Aged Out   Immunization History  Administered Date(s) Administered   Fluad Quad(high Dose 65+) 04/19/2019, 01/09/2020, 02/01/2020, 01/14/2021, 01/27/2022   Influenza Split 03/22/2012   Influenza Whole 03/10/2006, 02/01/2008   Influenza, High Dose Seasonal PF 01/06/2017, 02/01/2018   Influenza,inj,Quad PF,6+ Mos 03/01/2013, 02/14/2014, 12/27/2014, 02/19/2016, 12/19/2017   Influenza-Unspecified 04/19/2019   PFIZER(Purple Top)SARS-COV-2 Vaccination 06/23/2019, 07/16/2019   PNEUMOCOCCAL CONJUGATE-20 01/27/2022   Pneumococcal Conjugate-13 10/01/2013   Pneumococcal Polysaccharide-23 09/25/2008   Td 05/03/2004   Tdap 05/13/2015   Zoster, Live 05/30/2015    These are the patient goals that we discussed:  Goals Addressed               This Visit's Progress     Patient Stated (pt-stated)        Patient stated that she would like to get more stamina and have better pain management.         This is a list of Health Maintenance Items that are overdue or due now: Shingles vaccine    Orders/Referrals Placed Today: No orders of the defined types were placed in this encounter.  (Contact our referral department at [phone] you have  not spoken with someone about your referral appointment within the next 5 days)    Follow-up Plan Follow-up with MHali Marry MD as planned Schedule shingles vaccine at your pharmacy. Medicare wellness visit in one year. AVS printed and mailed to the patient.      Health Maintenance, Female Adopting a healthy lifestyle and getting preventive care are important in promoting health and wellness. Ask your health care provider about: The right schedule for you to have regular tests and exams. Things you can do on your own to prevent diseases and keep yourself healthy. What should I know about diet, weight, and exercise? Eat a healthy diet  Eat a diet that includes plenty of vegetables, fruits, low-fat dairy products, and lean protein. Do not eat a lot of foods that are high in solid fats, added sugars, or sodium. Maintain a healthy weight Body mass index (BMI) is used to identify weight problems. It estimates body fat based on height and weight. Your health care provider can help determine your BMI and help you achieve or maintain a healthy weight. Get regular exercise Get regular exercise. This is one of the most important things you can do for your health. Most adults should: Exercise for at least 150 minutes each week. The exercise should increase your heart rate and make you sweat (moderate-intensity exercise). Do strengthening exercises at least twice a week. This is in addition to the moderate-intensity exercise. Spend less time sitting. Even light physical activity can be beneficial. Watch cholesterol and blood lipids Have your blood tested for  lipids and cholesterol at 78 years of age, then have this test every 5 years. Have your cholesterol levels checked more often if: Your lipid or cholesterol levels are high. You are older than 78 years of age. You are at high risk for heart disease. What should I know about cancer screening? Depending on your health history and  family history, you may need to have cancer screening at various ages. This may include screening for: Breast cancer. Cervical cancer. Colorectal cancer. Skin cancer. Lung cancer. What should I know about heart disease, diabetes, and high blood pressure? Blood pressure and heart disease High blood pressure causes heart disease and increases the risk of stroke. This is more likely to develop in people who have high blood pressure readings or are overweight. Have your blood pressure checked: Every 3-5 years if you are 72-62 years of age. Every year if you are 48 years old or older. Diabetes Have regular diabetes screenings. This checks your fasting blood sugar level. Have the screening done: Once every three years after age 12 if you are at a normal weight and have a low risk for diabetes. More often and at a younger age if you are overweight or have a high risk for diabetes. What should I know about preventing infection? Hepatitis B If you have a higher risk for hepatitis B, you should be screened for this virus. Talk with your health care provider to find out if you are at risk for hepatitis B infection. Hepatitis C Testing is recommended for: Everyone born from [birth year] through 1965. Anyone with known risk factors for hepatitis C. Sexually transmitted infections (STIs) Get screened for STIs, including gonorrhea and chlamydia, if: You are sexually active and are younger than 78 years of age. You are older than 78 years of age and your health care provider tells you that you are at risk for this type of infection. Your sexual activity has changed since you were last screened, and you are at increased risk for chlamydia or gonorrhea. Ask your health care provider if you are at risk. Ask your health care provider about whether you are at high risk for HIV. Your health care provider may recommend a prescription medicine to help prevent HIV infection. If you choose to take medicine to prevent HIV,  you should first get tested for HIV. You should then be tested every 3 months for as long as you are taking the medicine. Pregnancy If you are about to stop having your period (premenopausal) and you may become pregnant, seek counseling before you get pregnant. Take 400 to 800 micrograms (mcg) of folic acid every day if you become pregnant. Ask for birth control (contraception) if you want to prevent pregnancy. Osteoporosis and menopause Osteoporosis is a disease in which the bones lose minerals and strength with aging. This can result in bone fractures. If you are 34 years old or older, or if you are at risk for osteoporosis and fractures, ask your health care provider if you should: Be screened for bone loss. Take a calcium or vitamin D supplement to lower your risk of fractures. Be given hormone replacement therapy (HRT) to treat symptoms of menopause. Follow these instructions at home: Alcohol use Do not drink alcohol if: Your health care provider tells you not to drink. You are pregnant, may be pregnant, or are planning to become pregnant. If you drink alcohol: Limit how much you have to: 0-1 drink a day. Know how much alcohol is in your drink. In  the [place] one drink equals one 12 oz bottle of beer (355 mL), one 5 oz glass of wine (148 mL), or one 1 oz glass of hard liquor (44 mL). Lifestyle Do not use any products that contain nicotine or tobacco. These products include cigarettes, chewing tobacco, and vaping devices, such as e-cigarettes. If you need help quitting, ask your health care provider. Do not use street drugs. Do not share needles. Ask your health care provider for help if you need support or information about quitting drugs. General instructions Schedule regular health, dental, and eye exams. Stay current with your vaccines. Tell your health care provider if: You often feel depressed. You have ever been abused or do not feel safe at home. Summary Adopting a healthy  lifestyle and getting preventive care are important in promoting health and wellness. Follow your health care provider's instructions about healthy diet, exercising, and getting tested or screened for diseases. Follow your health care provider's instructions on monitoring your cholesterol and blood pressure. This information is not intended to replace advice given to you by your health care provider. Make sure you discuss any questions you have with your health care provider. Document Revised: 09/08/2020 Document Reviewed: 09/08/2020 Elsevier Patient Education  [place].

## 2022-04-05 NOTE — Progress Notes (Signed)
MEDICARE ANNUAL WELLNESS VISIT  04/05/2022  Telephone Visit Disclaimer This Medicare AWV was conducted by telephone due to national recommendations for restrictions regarding the COVID-19 Pandemic (e.g. social distancing).  I verified, using two identifiers, that I am speaking with Annette Ramirez or their authorized healthcare agent. I discussed the limitations, risks, security, and privacy concerns of performing an evaluation and management service by telephone and the potential availability of an in-person appointment in the future. The patient expressed understanding and agreed to proceed.  Location of Patient: Home Location of Provider (nurse):  In the office.  Subjective:    Annette Ramirez is a 76 y.o. female patient of Metheney, Rene Kocher, MD who had a Medicare Annual Wellness Visit today via telephone. Annette Ramirez is Retired and lives with their spouse. she has 2 children. she reports that she is socially active and does interact with friends/family regularly. she is minimally physically active and enjoys cooking.  Patient Care Team: Hali Marry, MD as PCP - General (Family Medicine) Darlis Loan, MD as Referring Physician (Gastroenterology) Darius Bump, [facility] as Pharmacist (Pharmacist)     04/05/2022   11:09 AM 01/14/2021    8:46 AM 07/16/2019    1:07 PM 07/11/2018    2:08 PM 10/01/2013    1:23 PM 02/28/2013    6:15 PM 02/28/2013    7:07 AM  Advanced Directives  Does Patient Have a Medical Advance Directive? Yes Yes Yes Yes Patient has advance directive, copy not in chart  Patient has advance directive, copy not in chart  Type of Advance Directive Living will Living will;Healthcare Power of [place];Living will [place];Living will [place];Living will Advance instruction for mental health treatment   Does patient want to make changes to medical advance directive? No - Patient declined No -  Patient declined No - Patient declined No - Patient declined     Copy of [place] in Chart?  No - copy requested No - copy requested No - copy requested       Hospital Utilization Over the Past 12 Months: # of hospitalizations or ER visits: 0 # of surgeries: 0  Review of Systems    Patient reports that her overall health is unchanged compared to last year.  History obtained from chart review and the patient  Patient Reported Readings (BP, Pulse, CBG, Weight, etc) none  Pain Assessment Pain : No/denies pain     Current Medications & Allergies (verified) Allergies as of 04/05/2022       Reactions   Amoxicillin-pot Clavulanate    REACTION: nausea and abdominal pain   Aspirin    REACTION: upset stomach   Codeine    REACTION: nausea   Hctz [hydrochlorothiazide] Other (See Comments)   Hyponatremia   Nsaids Nausea Only   Other Nausea And Vomiting        Medication List        Accurate as of April 05, 2022 11:26 AM. If you have any questions, ask your nurse or doctor.          albuterol 108 (90 Base) MCG/ACT inhaler Commonly known as: VENTOLIN HFA Inhale 2 puffs into the lungs every 6 (six) hours as needed for wheezing.   amLODipine 5 MG tablet Commonly known as: NORVASC Take 1 tablet (5 mg total) by mouth daily.   atenolol 100 MG tablet Commonly known as: TENORMIN Take 1 tablet (100 mg total) by mouth 2 (  two) times daily.   atorvastatin 20 MG tablet Commonly known as: LIPITOR TAKE 1 TABLET BY MOUTH DAILY   Banophen 25 mg capsule Generic drug: diphenhydrAMINE TAKE ONE CAPSULE BY MOUTH EVERY 6 HOURS AS NEEDED FOR FOR ITCHING, ALLERGIES OR SLEEP (HEADACHE) FOR UP TO THREE DAYS   Breztri Aerosphere 160-9-4.8 MCG/ACT Aero Generic drug: Budeson-Glycopyrrol-Formoterol Inhale 1 Inhaler into the lungs in the morning and at bedtime.   cyanocobalamin 100 MCG tablet Commonly known as: VITAMIN B12 Take 100 mcg by mouth daily.   Fish Oil  1000 MG Caps Take by mouth.   gabapentin 100 MG capsule Commonly known as: NEURONTIN TAKE 2 CAPSULES BY MOUTH IN THE  MORNING, 1 CAPSULE AT NOON AND 3 CAPSULES AT BEDTIME   loperamide 2 MG capsule Commonly known as: IMODIUM   losartan 100 MG tablet Commonly known as: COZAAR Take 1 tablet (100 mg total) by mouth daily.   losartan-hydrochlorothiazide 100-12.5 MG tablet Commonly known as: HYZAAR   MAGNESIUM PO Take 1 tablet by mouth daily.   omeprazole 40 MG capsule Commonly known as: PRILOSEC TAKE 1 CAPSULE BY MOUTH DAILY   oxybutynin 10 MG 24 hr tablet Commonly known as: DITROPAN-XL TAKE 1 TABLET BY MOUTH AT  BEDTIME   oxyCODONE-acetaminophen 5-325 MG tablet Commonly known as: PERCOCET/ROXICET 1 tab po QAM and 1/2 in afternoon and 1/2 at bedtime   oxyCODONE-acetaminophen 5-325 MG tablet Commonly known as: PERCOCET/ROXICET 1 tab po QAM and 1/2 in afternoon and 1/2 at bedtime   oxyCODONE-acetaminophen 5-325 MG tablet Commonly known as: PERCOCET/ROXICET Take 1 tablet by mouth in the morning, 1/2 tablet in the afternoon, and 1/2 tablet at bedtime.   QUEtiapine 25 MG tablet Commonly known as: SEROQUEL What changed: Another medication with the same name was removed. Continue taking this medication, and follow the directions you see here.   VITAMIN D3 PO Take 1 capsule by mouth daily.        History (reviewed): Past Medical History:  Diagnosis Date   Anxiety    Asthma    COPD (chronic obstructive pulmonary disease) ([place])    Depression    Ductal carcinoma in situ (DCIS) of right breast 08/16/2019   Exertional shortness of breath    Family history of anesthesia complication    "granddaughter has PONV" (02/28/2013)   Fibromyalgia    GERD (gastroesophageal reflux disease)    H/O hiatal hernia    Hypertension    Migraines    "in the past; they've stopped" (02/28/2013)   Opiate use 06/06/2017   Osteoporosis    PONV (postoperative nausea and vomiting)    Sinus  headache    Past Surgical History:  Procedure Laterality Date   ABDOMINAL HYSTERECTOMY  05/03/1973   APPENDECTOMY  05/03/1973   BREAST CYST ASPIRATION Left 01/02/1979   COLONOSCOPY W/ BIOPSIES AND POLYPECTOMY  05/03/2010   DILATION AND CURETTAGE OF UTERUS  01/02/1959   EXCISIONAL HEMORRHOIDECTOMY  01/02/1979   LUMBAR LAMINECTOMY  02/28/2013   LUMBAR LAMINECTOMY/DECOMPRESSION MICRODISCECTOMY N/A 02/28/2013   Procedure: LUMBAR LAMINECTOMY/DECOMPRESSION MICRODISCECTOMY/Lumbar 4-5 decompression;  Surgeon: Sinclair Ship, MD;  Location: [facility];  Service: Orthopedics;  Laterality: N/A;  Lumbar 4-5 decompression   MASTECTOMY Left 01/2021   MASTECTOMY Right 08/21/2019   TONSILLECTOMY  05/03/1948   TUBAL LIGATION  05/03/1970   Family History  Problem Relation Age of Onset   Alzheimer's disease Mother    Prostate cancer Other        grandfather   Stroke Other  grandparent   Social History   Socioeconomic History   Marital status: Married    Spouse name: [name]   Number of children: 2   Years of education: 13   Highest education level: Some college, no degree  Occupational History   Occupation: worked for Forensic psychologist    Comment: retired  Tobacco Use   Smoking status: Former    Packs/day: 0.25    Years: 40.00    Total pack years: 10.00    Types: Cigarettes    Quit date: 02/01/2020    Years since quitting: 2.1   Smokeless tobacco: Former  Scientific laboratory technician Use: Never used  Substance and Sexual Activity   Alcohol use: Not Currently    Alcohol/week: 3.0 standard drinks of alcohol    Types: 3 Glasses of wine per week    Comment: occasionally social   Drug use: No   Sexual activity: Not Currently  Other Topics Concern   Not on file  Social History Narrative   Lives with her husband. She recently lost her daughter and is in support group for grief. She enjoys cooking.    Social Determinants of Health   Financial Resource Strain: Low Risk  (04/05/2022)   Overall  Financial Resource Strain (CARDIA)    Difficulty of Paying Living Expenses: Not hard at all  Food Insecurity: No Food Insecurity (04/05/2022)   Hunger Vital Sign    Worried About Running Out of Food in the Last Year: Never true    Ran Out of Food in the Last Year: Never true  Transportation Needs: No Transportation Needs (04/05/2022)   PRAPARE - Hydrologist (Medical): No    Lack of Transportation (Non-Medical): No  Physical Activity: Inactive (04/05/2022)   Exercise Vital Sign    Days of Exercise per Week: 0 days    Minutes of Exercise per Session: 0 min  Stress: No Stress Concern Present (04/05/2022)   [place]    Feeling of Stress : Only a little  Social Connections: Moderately Isolated (04/05/2022)   Social Connection and Isolation Panel [NHANES]    Frequency of Communication with Friends and Family: More than three times a week    Frequency of Social Gatherings with Friends and Family: Twice a week    Attends Religious Services: Never    Marine scientist or Organizations: No    Attends Archivist Meetings: Never    Marital Status: Married    Activities of Daily Living    04/05/2022   11:17 AM  In your present state of health, do you have any difficulty performing the following activities:  Hearing? 0  Vision? 0  Difficulty concentrating or making decisions? 1  Comment some difficulty remembering.  Walking or climbing stairs? 1  Comment has peripheral neuropathy.  Dressing or bathing? 0  Doing errands, shopping? 0  Preparing Food and eating ? N  Using the Toilet? N  In the past six months, have you accidently leaked urine? N  Do you have problems with loss of bowel control? N  Managing your Medications? N  Managing your Finances? N  Housekeeping or managing your Housekeeping? N    Patient Education/ Literacy How often do you need to have someone help you when  you read instructions, pamphlets, or other written materials from your doctor or pharmacy?: 1 - Never What is the last grade level you completed in school?: one year of  college  Exercise Current Exercise Habits: The patient does not participate in regular exercise at present, Exercise limited by: None identified  Diet Patient reports consuming 2 meals a day and 1 snack(s) a day Patient reports that her primary diet is: Regular Patient reports that she does have regular access to food.   Depression Screen    04/05/2022   11:09 AM 01/27/2022   11:52 AM 07/17/2021    2:04 PM 03/16/2021    2:35 PM 01/14/2021    8:47 AM 08/22/2020    2:44 PM 03/06/2020   10:47 AM  PHQ 2/9 Scores  PHQ - 2 Score 0 '2 2 1 2 2 1  '$ PHQ- 9 Score '6 5 4 6 8 4 5     '$ Fall Risk    04/05/2022   11:09 AM 01/27/2022    8:50 AM 09/17/2021    3:01 PM 07/17/2021    2:03 PM 01/14/2021    8:46 AM  Fall Risk   Falls in the past year? 0 0 0 0 1  Number falls in past yr: 0 0 0 0 0  Injury with Fall? 0 0 0 0 0  Risk for fall due to : No Fall Risks No Fall Risks No Fall Risks No Fall Risks History of fall(s)  Follow up Falls evaluation completed Falls evaluation completed Falls prevention discussed;Falls evaluation completed Falls prevention discussed;Falls evaluation completed Falls evaluation completed;Education provided;Falls prevention discussed     Objective:  Annette Ramirez seemed alert and oriented and she participated appropriately during our telephone visit.  Blood Pressure Weight BMI  BP Readings from Last 3 Encounters:  01/27/22 128/67  09/17/21 118/63  07/17/21 113/65   Wt Readings from Last 3 Encounters:  01/27/22 147 lb (66.7 kg)  09/17/21 154 lb (69.9 kg)  07/17/21 159 lb (72.1 kg)   BMI Readings from Last 1 Encounters:  01/27/22 26.89 kg/m    *Unable to obtain current vital signs, weight, and BMI due to telephone visit type  Hearing/Vision  Annette Ramirez did not seem to have difficulty with  hearing/understanding during the telephone conversation Reports that she has not had a formal eye exam by an eye care professional within the past year Reports that she has not had a formal hearing evaluation within the past year *Unable to fully assess hearing and vision during telephone visit type  Cognitive Function:    04/05/2022   11:20 AM 01/14/2021    8:56 AM 07/16/2019    1:12 PM 07/11/2018    2:14 PM  6CIT Screen  What Year? 0 points 0 points 0 points 0 points  What month? 0 points 0 points 0 points 0 points  What time? 0 points 0 points 0 points 0 points  Count back from 20 0 points 0 points 0 points 0 points  Months in reverse 0 points 0 points 0 points 0 points  Repeat phrase 0 points 0 points 2 points 0 points  Total Score 0 points 0 points 2 points 0 points   (Normal:0-7, Significant for Dysfunction: >8)  Normal Cognitive Function Screening: Yes   Immunization & Health Maintenance Record Immunization History  Administered Date(s) Administered   Fluad Quad(high Dose 65+) 04/19/2019, 01/09/2020, 02/01/2020, 01/14/2021, 01/27/2022   Influenza Split 03/22/2012   Influenza Whole 03/10/2006, 02/01/2008   Influenza, High Dose Seasonal PF 01/06/2017, 02/01/2018   Influenza,inj,Quad PF,6+ Mos 03/01/2013, 02/14/2014, 12/27/2014, 02/19/2016, 12/19/2017   Influenza-Unspecified 04/19/2019   PFIZER(Purple Top)SARS-COV-2 Vaccination 06/23/2019, 07/16/2019   PNEUMOCOCCAL CONJUGATE-20 01/27/2022  Pneumococcal Conjugate-13 10/01/2013   Pneumococcal Polysaccharide-23 09/25/2008   Td 05/03/2004   Tdap 05/13/2015   Zoster, Live 05/30/2015    Health Maintenance  Topic Date Due   COVID-19 Vaccine (3 - Pfizer risk series) 02/13/2023 (Originally 08/13/2019)   Zoster Vaccines- Shingrix (1 of 2) 04/29/2023 (Originally 07/25/1964)   COLONOSCOPY (Pts 45-48yr Insurance coverage will need to be confirmed)  05/22/2022   Medicare Annual Wellness (AWV)  04/06/2023   DTaP/Tdap/Td (3 - Td or  Tdap) 05/12/2025   Pneumonia Vaccine 76 Years old  Completed   INFLUENZA VACCINE  Completed   Hepatitis C Screening  Completed   HPV VACCINES  Aged Out       Assessment  This is a routine wellness examination for Annette Ramirez  Health Maintenance: Due or Overdue There are no preventive care reminders to display for this patient.   SDion Bodydoes not need a referral for CCommercial Metals CompanyAssistance: Care Management:   no Social Work:    no Prescription Assistance:  no Nutrition/Diabetes Education:  no   Plan:  Personalized Goals  Goals Addressed               This Visit's Progress     Patient Stated (pt-stated)        Patient stated that she would like to get more stamina and have better pain management.       Personalized Health Maintenance & Screening Recommendations  Shingles vaccine  Lung Cancer Screening Recommended: yes; patient declined at this time.  (Low Dose CT Chest recommended if Age 78-80years, 20 pack-year currently smoking OR have quit w/in past 15 years) Hepatitis C Screening recommended: no HIV Screening recommended: no  Advanced Directives: Written information was not prepared per patient's request.  Referrals & Orders No orders of the defined types were placed in this encounter.   Follow-up Plan Follow-up with MHali Marry MD as planned Schedule shingles vaccine at your pharmacy. Medicare wellness visit in one year. AVS printed and mailed to the patient.   I have personally reviewed and noted the following in the patient's chart:   Medical and social history Use of alcohol, tobacco or illicit drugs  Current medications and supplements Functional ability and status Nutritional status Physical activity Advanced directives List of other physicians Hospitalizations, surgeries, and ER visits in previous 12 months Vitals Screenings to include cognitive, depression, and falls Referrals and appointments  In addition, I  have reviewed and discussed with SDion Bodycertain preventive protocols, quality metrics, and best practice recommendations. A written personalized care plan for preventive services as well as general preventive health recommendations is available and can be mailed to the patient at her request.      BTinnie Gens RN BSN  04/05/2022

## 2022-04-17 ENCOUNTER — Other Ambulatory Visit: Payer: Self-pay | Admitting: Family Medicine

## 2022-04-17 DIAGNOSIS — I10 Essential (primary) hypertension: Secondary | ICD-10-CM

## 2022-04-28 ENCOUNTER — Ambulatory Visit: Payer: Medicare Other | Admitting: Family Medicine

## 2022-05-07 ENCOUNTER — Encounter: Payer: Self-pay | Admitting: Family Medicine

## 2022-05-07 ENCOUNTER — Ambulatory Visit (INDEPENDENT_AMBULATORY_CARE_PROVIDER_SITE_OTHER): Payer: Medicare Other | Admitting: Family Medicine

## 2022-05-07 VITALS — BP 139/69 | HR 60 | Ht 62.0 in | Wt 139.0 lb

## 2022-05-07 DIAGNOSIS — E785 Hyperlipidemia, unspecified: Secondary | ICD-10-CM

## 2022-05-07 DIAGNOSIS — R7989 Other specified abnormal findings of blood chemistry: Secondary | ICD-10-CM | POA: Diagnosis not present

## 2022-05-07 DIAGNOSIS — E519 Thiamine deficiency, unspecified: Secondary | ICD-10-CM | POA: Diagnosis not present

## 2022-05-07 DIAGNOSIS — G8929 Other chronic pain: Secondary | ICD-10-CM

## 2022-05-07 DIAGNOSIS — M545 Low back pain, unspecified: Secondary | ICD-10-CM

## 2022-05-07 MED ORDER — OXYCODONE-ACETAMINOPHEN 5-325 MG PO TABS: ORAL_TABLET | ORAL | 0 refills | Status: DC

## 2022-05-07 MED ORDER — OXYCODONE-ACETAMINOPHEN 5-325 MG PO TABS: 2.0000 | ORAL_TABLET | Freq: Every day | ORAL | 0 refills | Status: DC

## 2022-05-07 NOTE — Assessment & Plan Note (Signed)
Indication for chronic opioid: chronic back pain, neuropathy, and fibromyalgia Medication and dose: oxycodone-acetaminophen 5-325 mg tab # pills per month: 60 Last UDS date: 05/07/2022 Opioid Treatment Agreement signed (Y/N): yes Opioid Treatment Agreement last reviewed with patient:  08/2021 [facility] reviewed this encounter (include red flags):   Yes- no red flags.  F/U : 3 months

## 2022-05-07 NOTE — Progress Notes (Signed)
Established Patient Office Visit  Subjective   Patient ID: MARICELA SCHREUR, female    DOB: 1945-05-22  Age: 77 y.o. MRN: [id number]  Chief Complaint  Patient presents with   Pain Management    HPI  Follow-up for chronic pain management for chronic low back pain.  Due for urine drug screen today.  Currently on 60 tabs per month of oxycodone 5/325.  Feels like her pain is about the same.  She tries to stay active every day even if she just gets up and goes out and goes to [employer].  Reports taking a half a tab of the oxycodone at night has really made a big difference in her being able to sleep well and get some good rest.  She did well let me know that since I last saw her she did have the EGD and told that she had some irritated areas in her stomach and they did have to stretch her esophagus she is actually had to have that done once before.  She says now swallowing pills is much easier.    ROS    Objective:     BP 139/69   Pulse 60   Ht '5\' 2"'$  (1.575 m)   Wt 139 lb (63 kg)   SpO2 93%   BMI 25.42 kg/m    Physical Exam Vitals and nursing note reviewed.  Constitutional:      Appearance: She is well-developed.  HENT:     Head: Normocephalic and atraumatic.  Cardiovascular:     Rate and Rhythm: Normal rate and regular rhythm.     Heart sounds: Normal heart sounds.  Pulmonary:     Effort: Pulmonary effort is normal.     Breath sounds: Normal breath sounds.  Skin:    General: Skin is warm and dry.  Neurological:     Mental Status: She is alert and oriented to person, place, and time.  Psychiatric:        Behavior: Behavior normal.      No results found for any visits on 05/07/22.    The 10-year ASCVD risk score (Arnett DK, et al., 2019) is: 33.7%    Assessment & Plan:   Problem List Items Addressed This Visit       Other   Encounter for chronic pain management - Primary    Indication for chronic opioid: chronic back pain, neuropathy, and  fibromyalgia Medication and dose: oxycodone-acetaminophen 5-325 mg tab # pills per month: 60 Last UDS date: 05/07/2022 Opioid Treatment Agreement signed (Y/N): yes Opioid Treatment Agreement last reviewed with patient:  08/2021 N[facility] reviewed this encounter (include red flags):   Yes- no red flags.  F/U : 3 months      Relevant Medications   oxyCODONE-acetaminophen (PERCOCET/ROXICET) 5-325 MG tablet   oxyCODONE-acetaminophen (PERCOCET/ROXICET) 5-325 MG tablet (Start on 06/06/2022)   oxyCODONE-acetaminophen (PERCOCET/ROXICET) 5-325 MG tablet (Start on 07/04/2022)   Other Relevant Orders   DRUG MONITORING, PANEL 6 WITH CONFIRMATION, URINE   Vitamin B1   TSH   COMPLETE METABOLIC PANEL WITH GFR   Lipid Panel w/reflex Direct LDL   Chronic lower back pain   Relevant Medications   oxyCODONE-acetaminophen (PERCOCET/ROXICET) 5-325 MG tablet   oxyCODONE-acetaminophen (PERCOCET/ROXICET) 5-325 MG tablet (Start on 06/06/2022)   oxyCODONE-acetaminophen (PERCOCET/ROXICET) 5-325 MG tablet (Start on 07/04/2022)   Other Relevant Orders   Vitamin B1   TSH   COMPLETE METABOLIC PANEL WITH GFR   Lipid Panel w/reflex Direct LDL   Other Visit Diagnoses  Hyperlipidemia, unspecified hyperlipidemia type       Relevant Orders   Vitamin B1   TSH   COMPLETE METABOLIC PANEL WITH GFR   Lipid Panel w/reflex Direct LDL   Abnormal TSH       Relevant Orders   Vitamin B1   TSH   COMPLETE METABOLIC PANEL WITH GFR   Lipid Panel w/reflex Direct LDL   Thiamin deficiency       Relevant Orders   Vitamin B1       Encouraged her to get her shingles vaccine done at the pharmacy since it is covered under her Medicare part D.  She is also planning on getting the RSV vaccine.  Return in about 3 months (around 08/06/2022) for Chronic pain Management .    Beatrice Lecher, MD

## 2022-05-10 LAB — DRUG MONITORING, PANEL 6 WITH CONFIRMATION, URINE
6 Acetylmorphine: NEGATIVE ng/mL (ref <10)
Alcohol Metabolites: NEGATIVE ng/mL (ref <500)
Amphetamines: NEGATIVE ng/mL (ref <500)
Barbiturates: NEGATIVE ng/mL (ref <300)
Benzodiazepines: NEGATIVE ng/mL (ref <100)
Cocaine Metabolite: NEGATIVE ng/mL (ref <150)
Codeine: NEGATIVE ng/mL (ref <50)
Creatinine: 57.1 mg/dL (ref >=20.0)
Hydrocodone: NEGATIVE ng/mL (ref <50)
Hydromorphone: NEGATIVE ng/mL (ref <50)
Marijuana Metabolite: NEGATIVE ng/mL (ref <20)
Methadone Metabolite: NEGATIVE ng/mL (ref <100)
Morphine: NEGATIVE ng/mL (ref <50)
Norhydrocodone: NEGATIVE ng/mL (ref <50)
Noroxycodone: 2330 ng/mL — ABNORMAL HIGH (ref <50)
Opiates: NEGATIVE ng/mL (ref <100)
Oxidant: NEGATIVE ug/mL (ref <200)
Oxycodone: 622 ng/mL — ABNORMAL HIGH (ref <50)
Oxycodone: POSITIVE ng/mL — AB (ref <100)
Oxymorphone: 689 ng/mL — ABNORMAL HIGH (ref <50)
Phencyclidine: NEGATIVE ng/mL (ref <25)
pH: 6 (ref 4.5–9.0)

## 2022-05-10 LAB — DM TEMPLATE

## 2022-05-10 NOTE — Progress Notes (Signed)
Hi Jenyfer, thyroid and cholesterol look good.  The metabolic panel is normal as well.  Still awaiting the vitamin B1.

## 2022-05-11 ENCOUNTER — Encounter: Payer: Self-pay | Admitting: Family Medicine

## 2022-05-11 DIAGNOSIS — E519 Thiamine deficiency, unspecified: Secondary | ICD-10-CM | POA: Insufficient documentation

## 2022-05-11 LAB — COMPLETE METABOLIC PANEL WITH GFR
AG Ratio: 2.4 (calc) (ref 1.0–2.5)
ALT: 11 U/L (ref 6–29)
AST: 13 U/L (ref 10–35)
Albumin: 4.5 g/dL (ref 3.6–5.1)
Alkaline phosphatase (APISO): 49 U/L (ref 37–153)
BUN: 10 mg/dL (ref 7–25)
CO2: 26 mmol/L (ref 20–32)
Calcium: 9.3 mg/dL (ref 8.6–10.4)
Chloride: 96 mmol/L — ABNORMAL LOW (ref 98–110)
Creat: 0.77 mg/dL (ref 0.60–1.00)
Globulin: 1.9 g/dL (calc) (ref 1.9–3.7)
Glucose, Bld: 91 mg/dL (ref 65–99)
Potassium: 4.5 mmol/L (ref 3.5–5.3)
Sodium: 135 mmol/L (ref 135–146)
Total Bilirubin: 1.1 mg/dL (ref 0.2–1.2)
Total Protein: 6.4 g/dL (ref 6.1–8.1)
eGFR: 79 mL/min/{1.73_m2} (ref >=60)

## 2022-05-11 LAB — TSH: TSH: 1.61 mIU/L (ref 0.40–4.50)

## 2022-05-11 LAB — VITAMIN B1: Vitamin B1 (Thiamine): <6 nmol/L — ABNORMAL LOW (ref 8–30)

## 2022-05-11 LAB — LIPID PANEL W/REFLEX DIRECT LDL
Cholesterol: 127 mg/dL (ref <200)
HDL: 71 mg/dL (ref >=50)
LDL Cholesterol (Calc): 39 mg/dL (calc)
Non-HDL Cholesterol (Calc): 56 mg/dL (calc) (ref <130)
Total CHOL/HDL Ratio: 1.8 (calc) (ref <5.0)
Triglycerides: 88 mg/dL (ref <150)

## 2022-05-11 NOTE — Progress Notes (Signed)
Annette Ramirez, your vitamin B1 has dropped even lower than it was a year ago.  Are you currently taking a supplement?  If not please pick 1 up this weekend and start the tablets.  Would like to recheck your B1 in about 8 weeks.  This is a really important nutrient for your nervous system.

## 2022-05-12 ENCOUNTER — Other Ambulatory Visit: Payer: Self-pay | Admitting: *Deleted

## 2022-05-12 DIAGNOSIS — E519 Thiamine deficiency, unspecified: Secondary | ICD-10-CM

## 2022-06-15 DIAGNOSIS — J209 Acute bronchitis, unspecified: Secondary | ICD-10-CM | POA: Diagnosis not present

## 2022-06-15 DIAGNOSIS — F17211 Nicotine dependence, cigarettes, in remission: Secondary | ICD-10-CM | POA: Diagnosis not present

## 2022-06-15 DIAGNOSIS — Z122 Encounter for screening for malignant neoplasm of respiratory organs: Secondary | ICD-10-CM | POA: Diagnosis not present

## 2022-06-15 DIAGNOSIS — R0902 Hypoxemia: Secondary | ICD-10-CM | POA: Diagnosis not present

## 2022-06-15 DIAGNOSIS — Z9981 Dependence on supplemental oxygen: Secondary | ICD-10-CM | POA: Diagnosis not present

## 2022-06-15 DIAGNOSIS — J449 Chronic obstructive pulmonary disease, unspecified: Secondary | ICD-10-CM | POA: Diagnosis not present

## 2022-06-15 DIAGNOSIS — R918 Other nonspecific abnormal finding of lung field: Secondary | ICD-10-CM | POA: Diagnosis not present

## 2022-06-28 DIAGNOSIS — R638 Other symptoms and signs concerning food and fluid intake: Secondary | ICD-10-CM | POA: Diagnosis not present

## 2022-06-28 DIAGNOSIS — E519 Thiamine deficiency, unspecified: Secondary | ICD-10-CM | POA: Diagnosis not present

## 2022-07-01 LAB — VITAMIN B1: Vitamin B1 (Thiamine): 120 nmol/L — ABNORMAL HIGH (ref 8–30)

## 2022-07-01 NOTE — Progress Notes (Signed)
Hi Annette Ramirez, your vitamin B1 is on the high end.  Probably because you have been taking the supplement and it was still in your bloodstream when we drew the lab.  Will have you continue the B1 for another 3 months and then you can stop.

## 2022-07-12 DIAGNOSIS — J439 Emphysema, unspecified: Secondary | ICD-10-CM | POA: Diagnosis not present

## 2022-07-12 DIAGNOSIS — R918 Other nonspecific abnormal finding of lung field: Secondary | ICD-10-CM | POA: Diagnosis not present

## 2022-07-12 DIAGNOSIS — J9809 Other diseases of bronchus, not elsewhere classified: Secondary | ICD-10-CM | POA: Diagnosis not present

## 2022-07-12 DIAGNOSIS — Z87891 Personal history of nicotine dependence: Secondary | ICD-10-CM | POA: Diagnosis not present

## 2022-07-12 DIAGNOSIS — J449 Chronic obstructive pulmonary disease, unspecified: Secondary | ICD-10-CM | POA: Diagnosis not present

## 2022-07-12 DIAGNOSIS — I2510 Atherosclerotic heart disease of native coronary artery without angina pectoris: Secondary | ICD-10-CM | POA: Diagnosis not present

## 2022-07-17 ENCOUNTER — Other Ambulatory Visit: Payer: Self-pay | Admitting: Family Medicine

## 2022-07-17 DIAGNOSIS — I10 Essential (primary) hypertension: Secondary | ICD-10-CM

## 2022-07-27 ENCOUNTER — Other Ambulatory Visit: Payer: Self-pay | Admitting: Family Medicine

## 2022-07-27 DIAGNOSIS — I10 Essential (primary) hypertension: Secondary | ICD-10-CM

## 2022-07-28 DIAGNOSIS — J449 Chronic obstructive pulmonary disease, unspecified: Secondary | ICD-10-CM | POA: Diagnosis not present

## 2022-07-28 DIAGNOSIS — R0902 Hypoxemia: Secondary | ICD-10-CM | POA: Diagnosis not present

## 2022-08-02 ENCOUNTER — Encounter: Payer: Self-pay | Admitting: Family Medicine

## 2022-08-02 ENCOUNTER — Ambulatory Visit (INDEPENDENT_AMBULATORY_CARE_PROVIDER_SITE_OTHER): Payer: Medicare Other | Admitting: Family Medicine

## 2022-08-02 VITALS — BP 118/58 | HR 68 | Temp 97.7°F | Resp 18 | Ht 62.0 in | Wt 136.6 lb

## 2022-08-02 DIAGNOSIS — J449 Chronic obstructive pulmonary disease, unspecified: Secondary | ICD-10-CM

## 2022-08-02 DIAGNOSIS — M545 Low back pain, unspecified: Secondary | ICD-10-CM | POA: Diagnosis not present

## 2022-08-02 DIAGNOSIS — E519 Thiamine deficiency, unspecified: Secondary | ICD-10-CM

## 2022-08-02 DIAGNOSIS — I10 Essential (primary) hypertension: Secondary | ICD-10-CM | POA: Diagnosis not present

## 2022-08-02 DIAGNOSIS — G8929 Other chronic pain: Secondary | ICD-10-CM

## 2022-08-02 MED ORDER — OXYCODONE-ACETAMINOPHEN 5-325 MG PO TABS: 2.0000 | ORAL_TABLET | Freq: Every day | ORAL | 0 refills | Status: DC

## 2022-08-02 MED ORDER — OXYCODONE-ACETAMINOPHEN 5-325 MG PO TABS: ORAL_TABLET | ORAL | 0 refills | Status: DC

## 2022-08-02 NOTE — Assessment & Plan Note (Signed)
She is feeling better after recent exacerbation.  Following with pulmonology and has a follow-up CT next month.

## 2022-08-02 NOTE — Progress Notes (Signed)
Established Patient Office Visit  Subjective   Patient ID: Annette Ramirez, female    DOB: May 28, 1945  Age: 77 y.o. MRN: [id number]  Chief Complaint  Patient presents with   Pain Management    Patient is here for pain management she states that  she has been wheezing because of allergies, but otherwise she states she is ok    HPI  Follow-up chronic pain management for chronic low back pain.  Currently on 60 tabs per month of oxycodone 5/325.  Feels like her pain is about the same.  She tries to stay active every day even if she just gets up and goes out and goes to [employer].  Reports taking a half a tab of the oxycodone at night has really made a big difference in her being able to sleep well and get some good rest.   Also on recent labs in January she was noted to have B1 deficiency.  It was even worse than it was last year.  I have encouraged her to pick up a supplement so that we could recheck her levels today.  She ended up seeing her pulmonologist recently for acute bronchitis and just was not getting completely better.  She did have a round of antibiotics and prednisone.  She did have a CT with contrast on July 12, 2022 at [facility].  No suspicious pulmonary nodules.  There was scattered mild  peribronchovascular nodular consolidations most consistent with infection or inflammation.  They have recommended that she have a repeat CT for follow-up and so she will be scheduled next month for that.  She has a little bit more wheezing today but feels like it is more from allergies she does not feel like she is getting sick again.    ROS    Objective:     BP (!) 118/58   Pulse 68   Temp 97.7 F (36.5 C) (Oral)   Resp 18   Ht 5\' 2"  (1.575 m)   Wt 136 lb 9.6 oz (62 kg)   SpO2 96%   BMI 24.98 kg/m    Physical Exam Vitals and nursing note reviewed.  Constitutional:      Appearance: She is well-developed.  HENT:     Head: Normocephalic and atraumatic.   Cardiovascular:     Rate and Rhythm: Normal rate and regular rhythm.     Heart sounds: Normal heart sounds.  Pulmonary:     Effort: Pulmonary effort is normal.     Breath sounds: Normal breath sounds.  Skin:    General: Skin is warm and dry.  Neurological:     Mental Status: She is alert and oriented to person, place, and time.  Psychiatric:        Behavior: Behavior normal.     No results found for any visits on 08/02/22.    The ASCVD Risk score (Arnett DK, et al., 2019) failed to calculate for the following reasons:   The valid total cholesterol range is 130 to 320 mg/dL    Assessment & Plan:   Problem List Items Addressed This Visit       Cardiovascular and Mediastinum   HYPERTENSION, BENIGN ESSENTIAL    Fantastic BP today.!!         Respiratory   Stage 3 severe COPD by GOLD classification    She is feeling better after recent exacerbation.  Following with pulmonology and has a follow-up CT next month.        Other  Encounter for chronic pain management - Primary    Indication for chronic opioid: chronic back pain, neuropathy, and fibromyalgia Medication and dose: oxycodone-acetaminophen 5-325 mg tab # pills per month: 60 Last UDS date: 08/02/2022 Opioid Treatment Agreement signed (Y/N): yes Opioid Treatment Agreement last reviewed with patient:  08/2021 N[facility] reviewed this encounter (include red flags):   Yes- no red flags.  F/U : 3 months      Relevant Medications   oxyCODONE-acetaminophen (PERCOCET/ROXICET) 5-325 MG tablet   oxyCODONE-acetaminophen (PERCOCET/ROXICET) 5-325 MG tablet (Start on 08/31/2022)   oxyCODONE-acetaminophen (PERCOCET/ROXICET) 5-325 MG tablet (Start on 09/29/2022)   Other Relevant Orders   DRUG MONITORING, PANEL 6 WITH CONFIRMATION, URINE   Chronic lower back pain   Relevant Medications   oxyCODONE-acetaminophen (PERCOCET/ROXICET) 5-325 MG tablet   oxyCODONE-acetaminophen (PERCOCET/ROXICET) 5-325 MG tablet (Start on 08/31/2022)    oxyCODONE-acetaminophen (PERCOCET/ROXICET) 5-325 MG tablet (Start on 09/29/2022)   Other Relevant Orders   DRUG MONITORING, PANEL 6 WITH CONFIRMATION, URINE   Other Visit Diagnoses     Thiamin deficiency          Thiamine deficiency-I think this last time it was elevated because she was taking a supplement so we discussed holding it for at least a couple of weeks when I see her back in 3 months and then we can recheck her level at that time.  Return in about 3 months (around 11/01/2022) for Pain Mgt and check thiamin labs. Beatrice Lecher, MD

## 2022-08-02 NOTE — Assessment & Plan Note (Signed)
Fantastic BP today.!!

## 2022-08-02 NOTE — Assessment & Plan Note (Addendum)
Indication for chronic opioid: chronic back pain, neuropathy, and fibromyalgia Medication and dose: oxycodone-acetaminophen 5-325 mg tab # pills per month: 60 Last UDS date: 08/02/2022 Opioid Treatment Agreement signed (Y/N): yes Opioid Treatment Agreement last reviewed with patient:  08/2021 N[facility] reviewed this encounter (include red flags):   Yes- no red flags.  F/U : 3 months

## 2022-08-04 LAB — DRUG MONITORING, PANEL 6 WITH CONFIRMATION, URINE
6 Acetylmorphine: NEGATIVE ng/mL (ref <10)
Alcohol Metabolites: NEGATIVE ng/mL (ref <500)
Amphetamines: NEGATIVE ng/mL (ref <500)
Barbiturates: NEGATIVE ng/mL (ref <300)
Benzodiazepines: NEGATIVE ng/mL (ref <100)
Cocaine Metabolite: NEGATIVE ng/mL (ref <150)
Codeine: NEGATIVE ng/mL (ref <50)
Creatinine: 105.6 mg/dL (ref >=20.0)
Hydrocodone: NEGATIVE ng/mL (ref <50)
Hydromorphone: NEGATIVE ng/mL (ref <50)
Marijuana Metabolite: NEGATIVE ng/mL (ref <20)
Methadone Metabolite: NEGATIVE ng/mL (ref <100)
Morphine: NEGATIVE ng/mL (ref <50)
Norhydrocodone: NEGATIVE ng/mL (ref <50)
Noroxycodone: 2336 ng/mL — ABNORMAL HIGH (ref <50)
Opiates: NEGATIVE ng/mL (ref <100)
Oxidant: NEGATIVE ug/mL (ref <200)
Oxycodone: 630 ng/mL — ABNORMAL HIGH (ref <50)
Oxycodone: POSITIVE ng/mL — AB (ref <100)
Oxymorphone: 727 ng/mL — ABNORMAL HIGH (ref <50)
Phencyclidine: NEGATIVE ng/mL (ref <25)
pH: 6.9 (ref 4.5–9.0)

## 2022-08-04 LAB — DM TEMPLATE

## 2022-08-06 ENCOUNTER — Ambulatory Visit: Payer: Medicare Other | Admitting: Family Medicine

## 2022-08-10 DIAGNOSIS — D0511 Intraductal carcinoma in situ of right breast: Secondary | ICD-10-CM | POA: Diagnosis not present

## 2022-09-01 DIAGNOSIS — J438 Other emphysema: Secondary | ICD-10-CM | POA: Diagnosis not present

## 2022-09-01 DIAGNOSIS — H900 Conductive hearing loss, bilateral: Secondary | ICD-10-CM | POA: Diagnosis not present

## 2022-09-01 DIAGNOSIS — D7589 Other specified diseases of blood and blood-forming organs: Secondary | ICD-10-CM | POA: Diagnosis not present

## 2022-09-01 DIAGNOSIS — F17200 Nicotine dependence, unspecified, uncomplicated: Secondary | ICD-10-CM | POA: Diagnosis not present

## 2022-09-01 DIAGNOSIS — I10 Essential (primary) hypertension: Secondary | ICD-10-CM | POA: Diagnosis not present

## 2022-09-01 DIAGNOSIS — M818 Other osteoporosis without current pathological fracture: Secondary | ICD-10-CM | POA: Diagnosis not present

## 2022-09-01 DIAGNOSIS — D0511 Intraductal carcinoma in situ of right breast: Secondary | ICD-10-CM | POA: Diagnosis not present

## 2022-09-07 DIAGNOSIS — J439 Emphysema, unspecified: Secondary | ICD-10-CM | POA: Diagnosis not present

## 2022-09-07 DIAGNOSIS — F32A Depression, unspecified: Secondary | ICD-10-CM | POA: Diagnosis not present

## 2022-09-07 DIAGNOSIS — E871 Hypo-osmolality and hyponatremia: Secondary | ICD-10-CM | POA: Diagnosis not present

## 2022-09-07 DIAGNOSIS — Z800 Family history of malignant neoplasm of digestive organs: Secondary | ICD-10-CM | POA: Diagnosis not present

## 2022-09-07 DIAGNOSIS — Z888 Allergy status to other drugs, medicaments and biological substances status: Secondary | ICD-10-CM | POA: Diagnosis not present

## 2022-09-07 DIAGNOSIS — J479 Bronchiectasis, uncomplicated: Secondary | ICD-10-CM | POA: Diagnosis not present

## 2022-09-07 DIAGNOSIS — E7800 Pure hypercholesterolemia, unspecified: Secondary | ICD-10-CM | POA: Diagnosis not present

## 2022-09-07 DIAGNOSIS — Z1152 Encounter for screening for COVID-19: Secondary | ICD-10-CM | POA: Diagnosis not present

## 2022-09-07 DIAGNOSIS — R0602 Shortness of breath: Secondary | ICD-10-CM | POA: Diagnosis not present

## 2022-09-07 DIAGNOSIS — I10 Essential (primary) hypertension: Secondary | ICD-10-CM | POA: Diagnosis not present

## 2022-09-07 DIAGNOSIS — Z91A48 Caregiver's other noncompliance with patient's medication regimen for other reason: Secondary | ICD-10-CM | POA: Diagnosis not present

## 2022-09-07 DIAGNOSIS — G8929 Other chronic pain: Secondary | ICD-10-CM | POA: Diagnosis not present

## 2022-09-07 DIAGNOSIS — Z66 Do not resuscitate: Secondary | ICD-10-CM | POA: Diagnosis not present

## 2022-09-07 DIAGNOSIS — J441 Chronic obstructive pulmonary disease with (acute) exacerbation: Secondary | ICD-10-CM | POA: Diagnosis not present

## 2022-09-07 DIAGNOSIS — R918 Other nonspecific abnormal finding of lung field: Secondary | ICD-10-CM | POA: Diagnosis not present

## 2022-09-07 DIAGNOSIS — Z886 Allergy status to analgesic agent status: Secondary | ICD-10-CM | POA: Diagnosis not present

## 2022-09-07 DIAGNOSIS — J209 Acute bronchitis, unspecified: Secondary | ICD-10-CM | POA: Diagnosis not present

## 2022-09-07 DIAGNOSIS — J984 Other disorders of lung: Secondary | ICD-10-CM | POA: Diagnosis not present

## 2022-09-07 DIAGNOSIS — J440 Chronic obstructive pulmonary disease with acute lower respiratory infection: Secondary | ICD-10-CM | POA: Diagnosis not present

## 2022-09-07 DIAGNOSIS — Z885 Allergy status to narcotic agent status: Secondary | ICD-10-CM | POA: Diagnosis not present

## 2022-09-07 DIAGNOSIS — I700 Atherosclerosis of aorta: Secondary | ICD-10-CM | POA: Diagnosis not present

## 2022-09-07 DIAGNOSIS — R059 Cough, unspecified: Secondary | ICD-10-CM | POA: Diagnosis not present

## 2022-09-07 DIAGNOSIS — Z79899 Other long term (current) drug therapy: Secondary | ICD-10-CM | POA: Diagnosis not present

## 2022-09-07 DIAGNOSIS — Z87891 Personal history of nicotine dependence: Secondary | ICD-10-CM | POA: Diagnosis not present

## 2022-09-07 DIAGNOSIS — J9601 Acute respiratory failure with hypoxia: Secondary | ICD-10-CM | POA: Diagnosis not present

## 2022-09-15 ENCOUNTER — Telehealth: Payer: Self-pay | Admitting: *Deleted

## 2022-09-15 ENCOUNTER — Encounter: Payer: Self-pay | Admitting: *Deleted

## 2022-09-15 NOTE — Transitions of Care (Post Inpatient/ED Visit) (Signed)
09/15/2022  Name: Annette Ramirez MRN: [id number] DOB: [birth year]  Today's TOC FU Call Status: Today's TOC FU Call Status:: Successful TOC FU Call Competed TOC FU Call Complete Date: 09/15/22  Transition Care Management Follow-up Telephone Call Date of Discharge: 09/14/22 Discharge Facility: Other ([facility]) Name of Other ([facility]) Discharge Facility: [facility] Type of Discharge: Inpatient Admission Primary Inpatient Discharge Diagnosis:: Acute respiratory failure with hypoxia; COPD exacerbation How have you been since you were released from the hospital?: Better Any questions or concerns?: No  Items Reviewed: Did you receive and understand the discharge instructions provided?: Yes (briefly reviewed with patient who verbalizes good understanding of same-- outside hospital AVS) Medications obtained,verified, and reconciled?: Yes (Medications Reviewed) (Full medication reconciliation/ review completed; no concerns or discrepancies identified; confirmed patient obtained/ is taking all newly Rx'd medications as instructed; self-manages medications and denies questions/ concerns around medications today) Any new allergies since your discharge?: No Dietary orders reviewed?: Yes Type of Diet Ordered:: "Healthy as possible" Do you have support at home?: Yes People in Home: spouse Name of Support/Comfort Primary Source: Reports independent in self-care activities; spouse assists as/ if needed/ indicated  Medications Reviewed Today: Medications Reviewed Today     Reviewed by Michaela Corner, RN (Registered Nurse) on 09/15/22 at 1441  Med List Status: <None>   Medication Order Taking? Sig Documenting Provider Last Dose Status Informant  albuterol (PROVENTIL HFA;VENTOLIN HFA) 108 (90 Base) MCG/ACT inhaler [id number] Yes Inhale 2 puffs into the lungs every 6 (six) hours as needed for wheezing. Agapito Games, MD Taking Active   amLODipine (NORVASC) 5 MG tablet [id number] Yes TAKE 1  TABLET BY MOUTH DAILY Agapito Games, MD Taking Active   atenolol (TENORMIN) 100 MG tablet [id number] Yes TAKE 1 TABLET BY MOUTH TWICE  DAILY Agapito Games, MD Taking Active   atorvastatin (LIPITOR) 20 MG tablet [id number] Yes TAKE 1 TABLET BY MOUTH DAILY Agapito Games, MD Taking Active   Budeson-Glycopyrrol-Formoterol (BREZTRI AEROSPHERE) 160-9-4.8 MCG/ACT AERO [id number] Yes Inhale 1 Inhaler into the lungs in the morning and at bedtime. [provider] Taking Active            Med Note Otelia Limes Sep 15, 2022  2:32 PM) 09/15/22: reports is out-- needs refill; hospital gave her symbicort to take home at discharge-- she does not have new Rx for symbicort  cefdinir (OMNICEF) 300 MG capsule [id number] Yes Take 300 mg by mouth 2 (two) times daily. Agapito Games, MD Taking Active Self           Med Note Otelia Limes Sep 15, 2022  2:28 PM) 09/15/22: reports prescribed after recent hospital discharge on 09/14/22 x 4 days  Cholecalciferol (VITAMIN D3 PO) [id number] No Take 1 capsule by mouth daily.  Patient not taking: Reported on 09/15/2022   [provider] Not Taking Active   diphenhydrAMINE (BANOPHEN) 25 mg capsule [id number] No TAKE ONE CAPSULE BY MOUTH EVERY 6 HOURS AS NEEDED FOR FOR ITCHING, ALLERGIES OR SLEEP (HEADACHE) FOR UP TO THREE DAYS  Patient not taking: Reported on 09/15/2022   [provider] Not Taking Active   doxycycline (MONODOX) 100 MG capsule [id number] Yes Take 100 mg by mouth 2 (two) times daily. Agapito Games, MD Taking Active Self           Med Note Otelia Limes Sep 15, 2022  2:29 PM) 09/15/22: reports prescribed at time of hospital  discharge on 09/14/22 x 4 days  gabapentin (NEURONTIN) 100 MG capsule [id number] Yes TAKE 2 CAPSULES BY MOUTH IN THE  MORNING, 1 CAPSULE AT NOON AND 3 CAPSULES AT BEDTIME Agapito Games, MD Taking Active            Med Note Otelia Limes Sep 15, 2022   2:34 PM) 09/15/22-- Reports takes (3) pills TID  loperamide (IMODIUM) 2 MG capsule [id number] Yes  [provider] Taking Active            Med Note Otelia Limes Sep 15, 2022  2:35 PM) 09/15/22- reports has not needed in a very long time  losartan-hydrochlorothiazide (HYZAAR) 100-12.5 MG tablet [id number] Yes  [provider] Taking Active            Med Note Otelia Limes Sep 15, 2022  2:38 PM) 09/15/22: reports she was told not to take at time of hospital discharge on 09/14/22-- she does not know why: PLEASE CLARIFY  MAGNESIUM PO [id number] Yes Take 1 tablet by mouth daily. [provider] Taking Active   Omega-3 Fatty Acids (FISH OIL) 1000 MG CAPS [id number] No Take by mouth.  Patient not taking: Reported on 09/15/2022   [provider] Not Taking Active   omeprazole (PRILOSEC) 40 MG capsule [id number] Yes TAKE 1 CAPSULE BY MOUTH DAILY Agapito Games, MD Taking Active   oxybutynin (DITROPAN-XL) 10 MG 24 hr tablet [id number] Yes TAKE 1 TABLET BY MOUTH AT  BEDTIME Agapito Games, MD Taking Active   oxyCODONE-acetaminophen (PERCOCET/ROXICET) 5-325 MG tablet [id number] Yes 1 tab po QAM and 1/2 in afternoon and 1/2 at bedtime Agapito Games, MD Taking Active   oxyCODONE-acetaminophen (PERCOCET/ROXICET) 5-325 MG tablet [id number] Yes 1 tab po QAM and 1/2 in afternoon and 1/2 at bedtime Agapito Games, MD Taking Active   oxyCODONE-acetaminophen (PERCOCET/ROXICET) 5-325 MG tablet [id number] Yes Take 1 tablet by mouth in the morning, 1/2 tablet in the afternoon, and 1/2 tablet at bedtime. Agapito Games, MD Taking Active   predniSONE (STERAPRED UNI-PAK 21 TAB) 10 MG (21) TBPK tablet [id number] Yes Take by mouth daily. Agapito Games, MD Taking Active Self           Med Note Otelia Limes Sep 15, 2022  2:31 PM) 09/15/22- reports prescribed at time of hospital discharge on 09/14/22 x 4 days- dose pack  QUEtiapine  (SEROQUEL) 25 MG tablet [id number] Yes  [provider] Taking Active            Med Note Otelia Limes Sep 15, 2022  2:39 PM) 09/15/22: reports takes 100 mg qHS  thiamine (VITAMIN B-1) 100 MG tablet [id number] Yes Take 100 mg by mouth daily. Agapito Games, MD Taking Active Self  vitamin B-12 (CYANOCOBALAMIN) 100 MCG tablet [id number] Yes Take 100 mcg by mouth daily. [provider] Taking Active             Home Care and Equipment/Supplies: Were Home Health Services Ordered?: No Any new equipment or medical supplies ordered?: Yes Name of Medical supply agency?: "I can't remember" Were you able to get the equipment/medical supplies?: Yes Do you have any questions related to the use of the equipment/supplies?: No  Functional Questionnaire: Do you need assistance with bathing/showering or dressing?: No (husband assists as indicated) Do you need assistance with meal preparation?: No (husband assists as indicated) Do  you need assistance with eating?: No Do you have difficulty maintaining continence: No Do you need assistance with getting out of bed/getting out of a chair/moving?: No (husband assists as indicated) Do you have difficulty managing or taking your medications?: No (husband assists as indicated)  Follow up appointments reviewed: PCP Follow-up appointment confirmed?: Yes Date of PCP follow-up appointment?: 09/20/22 Follow-up Provider: PCP Specialist Hospital Follow-up appointment confirmed?: NA Do you need transportation to your follow-up appointment?: No Do you understand care options if your condition(s) worsen?: Yes-patient verbalized understanding  SDOH Interventions Today    Flowsheet Row Most Recent Value  SDOH Interventions   Food Insecurity Interventions Intervention Not Indicated  Transportation Interventions Intervention Not Indicated  [normally drives self,  spouse assists as indicated post-recent hospital discharge]        TOC Interventions Today    Flowsheet Row Most Recent Value  TOC Interventions   TOC Interventions Discussed/Reviewed TOC Interventions Discussed  [provided my direct contact information should questions/ concerns/ needs arise post-TOC call, prior to RN CM telephone visit 09/30/22]      Interventions Today    Flowsheet Row Most Recent Value  Chronic Disease   Chronic disease during today's visit Chronic Obstructive Pulmonary Disease (COPD), Other  [acute on chronic respiratory failure with hypoxia]  General Interventions   General Interventions Discussed/Reviewed General Interventions Discussed, Doctor Visits, Communication with, Referral to Nurse, Durable Medical Equipment (DME)  Doctor Visits Discussed/Reviewed Doctor Visits Discussed, PCP  Durable Medical Equipment (DME) Oxygen, Other  [confirmed new to home O2- using currently at 2.5 L/min,  confirmed has pulse oximeter,  confirmed does not use/ require assistive devices for ambulation]  PCP/Specialist Visits Compliance with follow-up visit  Communication with RN  Nutrition Interventions   Nutrition Discussed/Reviewed Nutrition Discussed  Pharmacy Interventions   Pharmacy Dicussed/Reviewed Pharmacy Topics Discussed  [Full medication review with updating medication list in EHR per patient report]  Safety Interventions   Safety Discussed/Reviewed Safety Discussed  [safe home use of O2]      Caryl Pina, RN, BSN, CCRN Alumnus RN CM Care Coordination/ Transition of Care- [facility] Care Management [phone]: direct office

## 2022-09-20 ENCOUNTER — Encounter: Payer: Self-pay | Admitting: Family Medicine

## 2022-09-20 ENCOUNTER — Ambulatory Visit (INDEPENDENT_AMBULATORY_CARE_PROVIDER_SITE_OTHER): Payer: Medicare Other | Admitting: Family Medicine

## 2022-09-20 VITALS — BP 102/61 | HR 63 | Ht 62.0 in | Wt 128.0 lb

## 2022-09-20 DIAGNOSIS — J441 Chronic obstructive pulmonary disease with (acute) exacerbation: Secondary | ICD-10-CM | POA: Diagnosis not present

## 2022-09-20 DIAGNOSIS — F5101 Primary insomnia: Secondary | ICD-10-CM | POA: Diagnosis not present

## 2022-09-20 DIAGNOSIS — J449 Chronic obstructive pulmonary disease, unspecified: Secondary | ICD-10-CM | POA: Diagnosis not present

## 2022-09-20 DIAGNOSIS — I10 Essential (primary) hypertension: Secondary | ICD-10-CM | POA: Diagnosis not present

## 2022-09-20 MED ORDER — ALBUTEROL SULFATE HFA 108 (90 BASE) MCG/ACT IN AERS
2.0000 | INHALATION_SPRAY | Freq: Four times a day (QID) | RESPIRATORY_TRACT | 0 refills | Status: AC | PRN
Start: 2022-09-20 — End: ?

## 2022-09-20 MED ORDER — LOSARTAN POTASSIUM 100 MG PO TABS
100.0000 mg | ORAL_TABLET | Freq: Every day | ORAL | 1 refills | Status: DC
Start: 2022-09-20 — End: 2023-03-14

## 2022-09-20 MED ORDER — BREZTRI AEROSPHERE 160-9-4.8 MCG/ACT IN AERO
2.0000 | INHALATION_SPRAY | Freq: Two times a day (BID) | RESPIRATORY_TRACT | 99 refills | Status: DC
Start: 2022-09-20 — End: 2022-10-14

## 2022-09-20 NOTE — Progress Notes (Signed)
Established Patient Office Visit  Subjective   Patient ID: ZOEE FUSSELL, female    DOB: 12-Nov-1945  Age: 77 y.o. MRN: [id number]  Chief Complaint  Patient presents with   Hospitalization Follow-up    HPI  77 year old female here today for hospital follow-up.  She has a history of COPD and was admitted to [facility] on May 7 and discharged home 6 days later on May 14.  She was diagnosed with COPD exacerbation with acute hypoxemia.  She had run out of her Breztri.  She was discharged home on Omnicef and doxycycline and prednisone.  They discontinued the HCTZ component of her BP pill. She is on 2 L of oxygen.  She has completed her antibiotics and still has a couple days left on the prednisone taper.  She actually is doing really well overall she feels so much better.  She was even able to do a little bit allies light housework yesterday and was excited about it.  She is having to turn her oxygen up to 3 L with walking but is able to usually sit and rest at 2 L.  Is also down about 8 pounds since she was last here.   She was not getting one of her blood pressure pills while she was in the hospital.  Like they changed her losartan HCTZ to just plain losartan.  She had some hyponatremia while there so I suspect that was the most likely reason.    ROS    Objective:     BP 102/61   Pulse 63   Ht 5\' 2"  (1.575 m)   Wt 128 lb (58.1 kg)   SpO2 94% Comment: 3L  BMI 23.41 kg/m    Physical Exam Vitals and nursing note reviewed.  Constitutional:      Appearance: She is well-developed.  HENT:     Head: Normocephalic and atraumatic.  Cardiovascular:     Rate and Rhythm: Normal rate and regular rhythm.     Heart sounds: Normal heart sounds.  Pulmonary:     Effort: Pulmonary effort is normal.     Breath sounds: Normal breath sounds.  Skin:    General: Skin is warm and dry.  Neurological:     Mental Status: She is alert and oriented to person, place, and time.  Psychiatric:         Behavior: Behavior normal.      No results found for any visits on 09/20/22.    The ASCVD Risk score (Arnett DK, et al., 2019) failed to calculate for the following reasons:   The valid total cholesterol range is 130 to 320 mg/dL    Assessment & Plan:   Problem List Items Addressed This Visit       Cardiovascular and Mediastinum   HYPERTENSION, BENIGN ESSENTIAL    BP on the low normal end. Will monitor. Her weight is down 8 lbs. send updated prescription for plain losartan and see if the hyponatremia improves.  She is actually coming back in 2 weeks to recheck her lungs so we can recheck a BMP at that point in time.      Relevant Medications   losartan (COZAAR) 100 MG tablet     Respiratory   Stage 3 severe COPD by GOLD classification (HCC)   Relevant Medications   Budeson-Glycopyrrol-Formoterol (BREZTRI AEROSPHERE) 160-9-4.8 MCG/ACT AERO   albuterol (VENTOLIN HFA) 108 (90 Base) MCG/ACT inhaler     Other   INSOMNIA - Primary    We previously  tried increasing Elavil but it could just cause dry mouth.  Encouraged her to try increasing her quetiapine to 2 tabs to see if that is helpful.  If it is not then we could try trazodone.      Other Visit Diagnoses     COPD exacerbation (HCC)       Relevant Medications   Budeson-Glycopyrrol-Formoterol (BREZTRI AEROSPHERE) 160-9-4.8 MCG/ACT AERO   albuterol (VENTOLIN HFA) 108 (90 Base) MCG/ACT inhaler      COPD exacerbation-continue to use albuterol as needed.  She is completed the antibiotics and will taper off the prednisone over the next couple days.  If she develops new or worsening symptoms then please let me know.  Did try to clean up the med list so I think we have it pretty accurate at this point.  Return in about 2 weeks (around 10/04/2022) for recheck lungs and check oxygen.  Nani Gasser, MD

## 2022-09-20 NOTE — Assessment & Plan Note (Signed)
We previously tried increasing Elavil but it could just cause dry mouth.  Encouraged her to try increasing her quetiapine to 2 tabs to see if that is helpful.  If it is not then we could try trazodone.

## 2022-09-20 NOTE — Assessment & Plan Note (Addendum)
BP on the low normal end. Will monitor. Her weight is down 8 lbs. send updated prescription for plain losartan and see if the hyponatremia improves.  She is actually coming back in 2 weeks to recheck her lungs so we can recheck a BMP at that point in time.

## 2022-09-29 ENCOUNTER — Telehealth: Payer: Self-pay | Admitting: Family Medicine

## 2022-09-29 NOTE — Telephone Encounter (Signed)
Patient called. She has a yeast infection on her bottom and it is very bad. She wants something called in, monistat is not working. She just came out of the hospital and can't come in for an appointment.

## 2022-09-30 ENCOUNTER — Ambulatory Visit: Payer: Self-pay

## 2022-09-30 MED ORDER — KETOCONAZOLE 2 % EX CREA: 1.0000 | TOPICAL_CREAM | Freq: Two times a day (BID) | CUTANEOUS | 0 refills | Status: DC

## 2022-09-30 NOTE — Patient Instructions (Signed)
Visit Information  Thank you for taking time to visit with me today. Please don't hesitate to contact me if I can be of assistance to you.   Following are the goals we discussed today:  Continue to take medications as prescribed Continue to attend provider visits as recommended/scheduled Continue to use oxygen as recommended and monitor your oxygen saturation levels as recommended Contact your provider for health questions or concerns   Our next appointment is by telephone on 10/15/22 at 11:30 am.  Please call the care guide team at [phone] if you need to cancel or reschedule your appointment.   If you are experiencing a Mental Health or Behavioral Health Crisis or need someone to talk to, please call the Suicide and Crisis Lifeline: 56  Kathyrn Sheriff, RN, MSN, BSN, CCM [facility] Care Coordinator [phone]

## 2022-09-30 NOTE — Telephone Encounter (Signed)
Meds ordered this encounter  Medications   ketoconazole (NIZORAL) 2 % cream    Sig: Apply 1 Application topically 2 (two) times daily.    Dispense:  30 g    Refill:  0

## 2022-09-30 NOTE — Patient Outreach (Signed)
  Care Coordination   Initial Visit Note   09/30/2022 Name: HUDSON POOT MRN: [id number] DOB: October 14, 1945  CARMINE ESHLEMAN is a 77 y.o. year old female who sees Metheney, Barbarann Ehlers, MD for primary care. I spoke with  Nelta Numbers by phone today.  What matters to the patients health and wellness today?  Admitted 09/07/22-09/14/22 with acute respiratory failure with hypoxia/COPD exacerbation. Mrs. Garfinkle reports she is feeling better and breathing better post hospitalization. She continues to use oxygen and reports oxygen sats 90%.  Goals Addressed             This Visit's Progress    Continue to improve post hospitalization       Interventions Today    Flowsheet Row Most Recent Value  Chronic Disease   Chronic disease during today's visit Chronic Obstructive Pulmonary Disease (COPD)  General Interventions   General Interventions Discussed/Reviewed General Interventions Discussed, Durable Medical Equipment (DME)  Doctor Visits Discussed/Reviewed Doctor Visits Discussed, Doctor Visits Reviewed, PCP  Durable Medical Equipment (DME) Oxygen, Other, BP Cuff  [pulse oximeter]  PCP/Specialist Visits Compliance with follow-up visit  Education Interventions   Education Provided Provided Education  Provided Verbal Education On Medication, When to see the doctor  [advised to continue to take medications as prescribed, encouraged to contact provider with any worsening signs/symptoms]  Pharmacy Interventions   Pharmacy Dicussed/Reviewed Pharmacy Topics Discussed  [medications reviewed]            SDOH assessments and interventions completed:  Yes  SDOH Interventions Today    Flowsheet Row Most Recent Value  SDOH Interventions   Food Insecurity Interventions Intervention Not Indicated  Housing Interventions Intervention Not Indicated  Transportation Interventions Intervention Not Indicated  Utilities Interventions Intervention Not Indicated      Care Coordination  Interventions:  Yes, provided   Follow up plan: Follow up call scheduled for 10/15/22    Encounter Outcome:  Pt. Visit Completed

## 2022-10-01 NOTE — Telephone Encounter (Signed)
Patient informed. 

## 2022-10-05 ENCOUNTER — Ambulatory Visit: Payer: Medicare Other | Admitting: Family Medicine

## 2022-10-07 DIAGNOSIS — B356 Tinea cruris: Secondary | ICD-10-CM | POA: Diagnosis not present

## 2022-10-07 DIAGNOSIS — L299 Pruritus, unspecified: Secondary | ICD-10-CM | POA: Diagnosis not present

## 2022-10-12 ENCOUNTER — Other Ambulatory Visit: Payer: Medicare Other

## 2022-10-12 NOTE — Progress Notes (Signed)
10/12/2022 Name: Annette Ramirez MRN: [id number] DOB: 04-01-1946  Chief Complaint  Patient presents with   Medication Management  150mg  of seroquel w/directions stating TID- pharmacy states last filled 50mg  in September 2023 w/ directions of 3hs Has been taking 2 tablets of what she has at home- could it be 50mg ? Yes, 50mg  and has been taking taking 2=100mg - instructed Checks BP at home states it has been "just wonderful" Breztri PAP Send rx's to [facility] pharmacy  Check on status of losartan 100mg - filled in may for 90d Gabapentin 300mg  BID?  {Visit Type:26650}  Patient is participating in a Managed Medicaid Plan:  {MM YES/NO:27447::"Yes"}  Subjective:  Care Team: Primary Care Provider: Agapito Games, MD ; Next Scheduled Visit: *** {careteamprovider:27366}  Medication Access/Adherence  Current Pharmacy:  [facility] [place], [place] - [address] [place] [address] Phone: [phone] Fax: [phone]  [facility] - [place], [place] - [address] [place] [address] Phone: [phone] Fax: [phone]  [facility] [address], [address] [place] [address] Phone: [phone] Fax: [phone]   Patient reports affordability concerns with their medications: {YES/NO:21197} Patient reports access/transportation concerns to their pharmacy: {YES/NO:21197} Patient reports adherence concerns with their medications:  {YES/NO:21197} ***   {Pharmacy S/O Choices:26420}   Objective:  Lab Results  Component Value Date   HGBA1C 5.4 03/17/2020    Lab Results  Component Value Date   CREATININE 0.77 05/07/2022   BUN 10 05/07/2022   NA 135 05/07/2022   K 4.5 05/07/2022   CL 96 (L) 05/07/2022   CO2 26 05/07/2022    Lab Results  Component Value Date   CHOL 127 05/07/2022   HDL 71  05/07/2022   LDLCALC 39 05/07/2022   TRIG 88 05/07/2022   CHOLHDL 1.8 05/07/2022    Medications Reviewed Today     Reviewed by Lenna Gilford, RPH (Pharmacist) on 10/12/22 at 1423  Med List Status: <None>   Medication Order Taking? Sig Documenting Provider Last Dose Status Informant  albuterol (VENTOLIN HFA) 108 (90 Base) MCG/ACT inhaler [id number] Yes Inhale 2 puffs into the lungs every 6 (six) hours as needed for wheezing. Agapito Games, MD Taking Active   amLODipine (NORVASC) 5 MG tablet [id number] Yes TAKE 1 TABLET BY MOUTH DAILY Agapito Games, MD Taking Active   atenolol (TENORMIN) 100 MG tablet [id number] Yes TAKE 1 TABLET BY MOUTH TWICE  DAILY Agapito Games, MD Taking Active   atorvastatin (LIPITOR) 20 MG tablet [id number] Yes TAKE 1 TABLET BY MOUTH DAILY Agapito Games, MD Taking Active   Budeson-Glycopyrrol-Formoterol (BREZTRI AEROSPHERE) 160-9-4.8 MCG/ACT AERO [id number] Yes Inhale 2 puffs into the lungs in the morning and at bedtime. Agapito Games, MD Taking Active   fluconazole (DIFLUCAN) 200 MG tablet [id number] Yes Take 200 mg by mouth every other day. [provider] Taking Active   gabapentin (NEURONTIN) 100 MG capsule [id number] Yes TAKE 2 CAPSULES BY MOUTH IN THE  MORNING, 1 CAPSULE AT NOON AND 3 CAPSULES AT BEDTIME Agapito Games, MD Taking Active            Med Note Littie Deeds, [place] A   Tue Oct 12, 2022  2:08 PM) Would like to change to 300mg  BID  ipratropium-albuterol (DUONEB) 0.5-2.5 (3) MG/3ML SOLN [id number] Yes Take 3 mLs by nebulization every 4 (four)  hours as needed. States taking twice a day [provider] Taking Active Self  losartan (COZAAR) 100 MG tablet [id number] No Take 1 tablet (100 mg total) by mouth daily.  Patient not taking: Reported on 10/12/2022   Agapito Games, MD Not Taking Active            Med Note Sabino Niemann A   Tue Oct 12, 2022  2:14 PM) Dose not have at home  MAGNESIUM PO  [id number] Yes Take 1 tablet by mouth daily. 250mg  daily [provider] Taking Active   nystatin cream (MYCOSTATIN) [id number] Yes Apply topically 2 (two) times daily. to affected area(s) [provider] Taking Active   omeprazole (PRILOSEC) 40 MG capsule [id number] Yes TAKE 1 CAPSULE BY MOUTH DAILY Agapito Games, MD Taking Active   oxybutynin (DITROPAN-XL) 10 MG 24 hr tablet [id number] Yes TAKE 1 TABLET BY MOUTH AT  BEDTIME Agapito Games, MD Taking Active   oxyCODONE-acetaminophen (PERCOCET/ROXICET) 5-325 MG tablet [id number] Yes Take 1 tablet by mouth in the morning, 1/2 tablet in the afternoon, and 1/2 tablet at bedtime. Agapito Games, MD Taking Active   QUEtiapine (SEROQUEL) 300 MG tablet [id number] Yes Take 300 mg by mouth at bedtime. [provider] Taking Active   thiamine (VITAMIN B-1) 100 MG tablet [id number] Yes Take 100 mg by mouth daily. Agapito Games, MD Taking Active Self  vitamin B-12 (CYANOCOBALAMIN) 100 MCG tablet [id number] Yes Take 100 mcg by mouth daily. [provider] Taking Active               Assessment/Plan:   {Pharmacy A/P Choices:26421}  Follow Up Plan: ***  ***

## 2022-10-14 ENCOUNTER — Telehealth: Payer: Self-pay

## 2022-10-14 ENCOUNTER — Other Ambulatory Visit: Payer: Self-pay

## 2022-10-14 DIAGNOSIS — J449 Chronic obstructive pulmonary disease, unspecified: Secondary | ICD-10-CM

## 2022-10-14 NOTE — Telephone Encounter (Signed)
-----   Message from Lenna Gilford, [facility] sent at 10/13/2022  4:38 PM EDT ----- Good afternoon!  Patient would qualify for AZ&Me PAP for Breztri- would you all mind to initiate this process, please?  Thank you!

## 2022-10-14 NOTE — Telephone Encounter (Signed)
Received notification from AZ&ME regarding approval for BREZTRI. Patient assistance approved from 10/14/2022 to 05/03/2023.  I have notified pt and told her as well  Her AZ&ME iD IS [id number]   Phone: [phone]  PLEASE BE ADVISED

## 2022-10-14 NOTE — Telephone Encounter (Signed)
Can you please have the provider to send New PRESCRIPTION E-SCRIBE [facility] FOR AZ[employer]ME NEW SCRIPT FOR BREZTRI

## 2022-10-14 NOTE — Progress Notes (Signed)
   10/14/2022  Patient ID: Annette Ramirez, female   DOB: 10/24/1945, 77 y.o.   MRN: [id number]  Received notification from medication assistance technician that patient has been approved for Breztri PAP.  Prescription needs to be sent to [facility] pharmacy for completion of process.  I have pended Breztri prescription for Dr. Linford Arnold to sign.  Lenna Gilford, PharmD, DPLA

## 2022-10-15 ENCOUNTER — Ambulatory Visit: Payer: Self-pay

## 2022-10-15 DIAGNOSIS — I10 Essential (primary) hypertension: Secondary | ICD-10-CM | POA: Diagnosis not present

## 2022-10-15 DIAGNOSIS — E871 Hypo-osmolality and hyponatremia: Secondary | ICD-10-CM | POA: Diagnosis not present

## 2022-10-15 DIAGNOSIS — J9601 Acute respiratory failure with hypoxia: Secondary | ICD-10-CM | POA: Diagnosis not present

## 2022-10-15 DIAGNOSIS — J441 Chronic obstructive pulmonary disease with (acute) exacerbation: Secondary | ICD-10-CM | POA: Diagnosis not present

## 2022-10-15 NOTE — Patient Outreach (Signed)
  Care Coordination   Follow Up Visit Note   10/15/2022 Name: Annette Ramirez MRN: [id number] DOB: 1945/12/02  Annette Ramirez is a 77 y.o. year old female who sees Metheney, Barbarann Ehlers, MD for primary care. I spoke with  Annette Ramirez by phone today.  What matters to the patients health and wellness today?  Annette Ramirez denies any concerns at this time. She denies any breathing problems. Reports saturation 96% resting and 93-94% with activity.  Goals Addressed             This Visit's Progress    Continue to improve post hospitalization       Interventions Today    Flowsheet Row Most Recent Value  Chronic Disease   Chronic disease during today's visit Chronic Obstructive Pulmonary Disease (COPD)  General Interventions   General Interventions Discussed/Reviewed General Interventions Reviewed  PCP/Specialist Visits Compliance with follow-up visit  [reviewed upcoming/scheduled visits]  Education Interventions   Education Provided Provided Education  Provided Verbal Education On When to see the doctor  [reiterated when to call the pulmonologist. offered education-patient declines]  Nutrition Interventions   Nutrition Discussed/Reviewed Nutrition Reviewed  [encouraged to continue to eat healthy]  Pharmacy Interventions   Pharmacy Dicussed/Reviewed Pharmacy Topics Reviewed, Affording Medications  [pharmacist assisting patient with obtaining breztri]            SDOH assessments and interventions completed:  No  Care Coordination Interventions:  Yes, provided   Follow up plan: Follow up call scheduled for 11/15/22    Encounter Outcome:  Pt. Visit Completed   Annette Sheriff, RN, MSN, BSN, CCM [facility] Care Coordinator [phone]

## 2022-10-15 NOTE — Patient Instructions (Signed)
Visit Information  Thank you for taking time to visit with me today. Please don't hesitate to contact me if I can be of assistance to you.   Following are the goals we discussed today:  Continue to take medications as prescribed Continue to attend provider visits as scheduled Continue to eat healthy   Our next appointment is by telephone on 11/15/22 at 11:00 am  Please call the care guide team at [phone] if you need to cancel or reschedule your appointment.   If you are experiencing a Mental Health or Behavioral Health Crisis or need someone to talk to, please call the Suicide and Crisis Lifeline: 83  Kathyrn Sheriff, RN, MSN, BSN, CCM [facility] Care Coordinator [phone]     COPD Action Plan A COPD action plan is a description of what to do when you have a flare (exacerbation) of chronic obstructive pulmonary disease (COPD). Your action plan is a color-coded plan that lists the symptoms that indicate whether your condition is under control and what actions to take. If you have symptoms in the green zone, it means you are doing well that day. If you have symptoms in the yellow zone, it means you are having a bad day or an exacerbation. If you have symptoms in the red zone, you need urgent medical care. Follow the plan that you and your health care provider developed. Review your plan with your health care provider at each visit. Red zone Symptoms in this zone mean that you should get medical help right away. They include: Feeling very short of breath, even when you are resting. Not being able to do any activities because of poor breathing. Not being able to sleep because of poor breathing. Fever or shaking chills. Feeling confused or very sleepy. Chest pain. Coughing up blood. If you have any of these symptoms, call emergency services (911 in the [place] or go to the nearest emergency room. Yellow zone Symptoms in this zone mean that your condition may be getting worse. They  include: Feeling more short of breath than usual. Having less energy for daily activities than usual. Phlegm or mucus that is thicker than usual. Needing to use your rescue inhaler or nebulizer more often than usual. More ankle swelling than usual. Coughing more than usual. Feeling like you have a chest cold. Trouble sleeping due to COPD symptoms. Decreased appetite. COPD medicines not helping as much as usual. If you experience any "yellow" symptoms: Keep taking your daily medicines as directed. Use your quick-relief inhaler as told by your health care provider. If you were prescribed steroid medicine to take by mouth (oral medicine), start taking it as told by your health care provider. If you were prescribed an antibiotic medicine, start taking it as told by your health care provider. Do not stop taking the antibiotic even if you start to feel better. Use oxygen as told by your health care provider. Get more rest. Do your pursed-lip breathing exercises. Do not smoke. Avoid any irritants in the air. If your signs and symptoms do not improve after taking these steps, call your health care provider right away. Green zone Symptoms in this zone mean that you are doing well. They include: Being able to do your usual activities and exercise. Having the usual amount of coughing, including the same amount of phlegm or mucus. Being able to sleep well. Having a good appetite. Where to find more information: You can find more information about COPD from: American Lung Association, My COPD Action Plan:  www.lung.org COPD Foundation: www.copdfoundation.org National Heart, Lung, & Blood Institute: [employer].is Follow these instructions at home: Continue taking your daily medicines as told by your health care provider. Make sure you receive all the immunizations that your health care provider recommends, especially the pneumococcal and influenza vaccines. Wash your hands often with soap and  water. Have family members wash their hands too. Regular hand washing can help prevent infections. Follow your usual exercise and diet plan. Avoid irritants in the air, such as smoke. Do not use any products that contain nicotine or tobacco. These products include cigarettes, chewing tobacco, and vaping devices, such as e-cigarettes. If you need help quitting, ask your health care provider. Summary A COPD action plan tells you what to do when you have a flare (exacerbation) of chronic obstructive pulmonary disease (COPD). Follow each action plan for your symptoms. If you have any symptoms in the red zone, call emergency services (911 in the [place] or go to the nearest emergency room. This information is not intended to replace advice given to you by your health care provider. Make sure you discuss any questions you have with your health care provider. Document Revised: 02/26/2020 Document Reviewed: 02/26/2020 Elsevier Patient Education  2024 [employer].

## 2022-10-18 MED ORDER — BREZTRI AEROSPHERE 160-9-4.8 MCG/ACT IN AERO
2.0000 | INHALATION_SPRAY | Freq: Two times a day (BID) | RESPIRATORY_TRACT | 99 refills | Status: AC
Start: 2022-10-18 — End: ?

## 2022-10-18 NOTE — Telephone Encounter (Signed)
Meds ordered this encounter  Medications   Budeson-Glycopyrrol-Formoterol (BREZTRI AEROSPHERE) 160-9-4.8 MCG/ACT AERO    Sig: Inhale 2 puffs into the lungs in the morning and at bedtime.    Dispense:  10.7 g    Refill:  PRN

## 2022-10-19 ENCOUNTER — Telehealth: Payer: Self-pay | Admitting: *Deleted

## 2022-10-19 NOTE — Telephone Encounter (Signed)
Received a form from AZ &ME to be completed for pt's Breztri.   Form completed, faxed, confirmation received and scanned into her chart.

## 2022-11-01 ENCOUNTER — Ambulatory Visit (INDEPENDENT_AMBULATORY_CARE_PROVIDER_SITE_OTHER): Payer: Medicare Other | Admitting: Family Medicine

## 2022-11-01 ENCOUNTER — Encounter: Payer: Self-pay | Admitting: Family Medicine

## 2022-11-01 VITALS — BP 132/61 | HR 64 | Temp 97.7°F | Ht 62.0 in | Wt 134.0 lb

## 2022-11-01 DIAGNOSIS — Z23 Encounter for immunization: Secondary | ICD-10-CM

## 2022-11-01 DIAGNOSIS — I10 Essential (primary) hypertension: Secondary | ICD-10-CM

## 2022-11-01 DIAGNOSIS — G8929 Other chronic pain: Secondary | ICD-10-CM | POA: Diagnosis not present

## 2022-11-01 DIAGNOSIS — E519 Thiamine deficiency, unspecified: Secondary | ICD-10-CM | POA: Diagnosis not present

## 2022-11-01 DIAGNOSIS — M545 Low back pain, unspecified: Secondary | ICD-10-CM | POA: Diagnosis not present

## 2022-11-01 DIAGNOSIS — N1831 Chronic kidney disease, stage 3a: Secondary | ICD-10-CM

## 2022-11-01 DIAGNOSIS — I700 Atherosclerosis of aorta: Secondary | ICD-10-CM | POA: Diagnosis not present

## 2022-11-01 DIAGNOSIS — F330 Major depressive disorder, recurrent, mild: Secondary | ICD-10-CM

## 2022-11-01 MED ORDER — OXYCODONE-ACETAMINOPHEN 5-325 MG PO TABS
2.0000 | ORAL_TABLET | Freq: Every day | ORAL | 0 refills | Status: DC
Start: 2022-11-01 — End: 2022-12-06

## 2022-11-01 NOTE — Addendum Note (Signed)
Addended by: Denis Koppel D on: 11/01/2022 07:42 PM   Modules accepted: Level of Service  

## 2022-11-01 NOTE — Assessment & Plan Note (Signed)
Continue daily statin. 

## 2022-11-01 NOTE — Assessment & Plan Note (Signed)
Continue to follow renal function every 6 months.  Last serum creatinine was done in March. Due for BMP today now off the hydrochlorothiazide.  Believe the hyponatremia should improve as well.

## 2022-11-01 NOTE — Progress Notes (Signed)
Established Patient Office Visit  Subjective   Patient ID: Annette Ramirez, female    DOB: [birth year]  Age: 77 y.o. MRN: [id number]  Chief Complaint  Patient presents with   Pain Management    HPI  F/U Pain Mgt:  - she is doing well overall oxycodone.  No specific concerns she does get some constipation but says she tries to address that.  Insomnia -is actually been really happy with the increase in the Seroquel it has been super helpful for her sleep quality and she says she is actually felt much better during the day she feels like she has had some really good days since we bumped up the quetiapine.  Still taking a vitamin B1 supplement.   He is doing well on the decreased blood pressure pill.  Her sodium was low and blood pressures were low.  We discontinued the HCTZ component of her blood pressure medication.     ROS    Objective:     BP 132/61   Pulse 64   Temp 97.7 F (36.5 C)   Ht 5\' 2"  (1.575 m)   Wt 134 lb (60.8 kg)   SpO2 97%   BMI 24.51 kg/m    Physical Exam Vitals and nursing note reviewed.  Constitutional:      Appearance: She is well-developed.  HENT:     Head: Normocephalic and atraumatic.  Cardiovascular:     Rate and Rhythm: Normal rate and regular rhythm.     Heart sounds: Normal heart sounds.  Pulmonary:     Effort: Pulmonary effort is normal.     Breath sounds: Normal breath sounds.  Skin:    General: Skin is warm and dry.  Neurological:     Mental Status: She is alert and oriented to person, place, and time.  Psychiatric:        Behavior: Behavior normal.      No results found for any visits on 11/01/22.    The ASCVD Risk score (Arnett DK, et al., 2019) failed to calculate for the following reasons:   The valid total cholesterol range is 130 to 320 mg/dL    Assessment & Plan:   Problem List Items Addressed This Visit       Cardiovascular and Mediastinum   HYPERTENSION, BENIGN ESSENTIAL   Relevant Orders   BASIC  METABOLIC PANEL WITH GFR   Arteriosclerosis of aorta (HCC)    Continue daily statin.        Genitourinary   CKD (chronic kidney disease) stage 3, GFR 30-59 ml/min (HCC)    Continue to follow renal function every 6 months.  Last serum creatinine was done in March. Due for BMP today now off the hydrochlorothiazide.  Believe the hyponatremia should improve as well.        Other   MDD (major depressive disorder), recurrent episode, mild (HCC)    Really noticed a positive bump in her mood with going back up on the quetiapine to 50 mg I like to keep it at this dose if at all possible.  Years ago she actually was up to 200 mg but I think that was too high and was causing too much sedation I think this is a good spot where she is noticing improvement in mood and pain but not seeing excess sedation.      Encounter for chronic pain management - Primary   Relevant Medications   oxyCODONE-acetaminophen (PERCOCET/ROXICET) 5-325 MG tablet   Other Relevant Orders   DRUG  MONITORING, PANEL 6 WITH CONFIRMATION, URINE   Chronic lower back pain   Relevant Medications   oxyCODONE-acetaminophen (PERCOCET/ROXICET) 5-325 MG tablet   Other Visit Diagnoses     Thiamin deficiency       Relevant Orders   Vitamin B1   Encounter for immunization       Relevant Orders   Pneumococcal conjugate vaccine 20-valent (Completed)       Return in about 3 months (around 02/01/2023) for Pain Mgt .   I spent 30 minutes on the day of the encounter to include pre-visit record review, face-to-face time with the patient and post visit ordering of test.   Nani Gasser, MD

## 2022-11-01 NOTE — Assessment & Plan Note (Signed)
Really noticed a positive bump in her mood with going back up on the quetiapine to 50 mg I like to keep it at this dose if at all possible.  Years ago she actually was up to 200 mg but I think that was too high and was causing too much sedation I think this is a good spot where she is noticing improvement in mood and pain but not seeing excess sedation.

## 2022-11-02 ENCOUNTER — Other Ambulatory Visit: Payer: Medicare Other

## 2022-11-02 DIAGNOSIS — M797 Fibromyalgia: Secondary | ICD-10-CM

## 2022-11-02 LAB — BASIC METABOLIC PANEL WITH GFR
CO2: 28 mmol/L (ref 20–32)
Calcium: 9 mg/dL (ref 8.6–10.4)
Potassium: 4.6 mmol/L (ref 3.5–5.3)

## 2022-11-02 NOTE — Progress Notes (Signed)
   11/02/2022  Patient ID: Annette Ramirez, female   DOB: 05/20/1945, 77 y.o.   MRN: [id number]  Patient outreach to follow-up after recent telephone visit addressing medication management/adherence.  -[name] states things have been going well- she is sleeping better and feeling better during the days now, too.  She believes her current dose of quetiapine 150mg  daily is very effective in treating her insomnia, causing her to get sounds sleep and experience less drowsiness/grogginess during the day-time.  Pending prescription with this dosing for pharmacy to place on hold until patient requests refill; also placing a note to pharmacy to discontinue any other quetiapine prescriptions to prevent polypharmacy/confusion.  -She has not yet received Breztri from the patient assistance program.  I contacted AZ&Me, and there was  delay in processing the prescription.  The patient is down to only a few days of medication remaining, so a request to expedite filling/shipping was placed.  I will follow-up with AZ[employer]Me again tomorrow afternoon for a shipping update and inform the patient.  -Patient is stating she forgot to address gabapentin dosing with Dr. Linford Arnold but would still like to change dosing to 300mg  BID versus 100mg  taking 2 in the morning, 1 at noon, and 3 at bedtime.  I will consult Dr. Linford Arnold to see if she is agreeable in an effort to decrease pill burden.  Annette Ramirez, PharmD, DPLA

## 2022-11-02 NOTE — Addendum Note (Signed)
Addended by: Nani Gasser D on: 11/02/2022 06:07 PM   Modules accepted: Level of Service

## 2022-11-02 NOTE — Progress Notes (Signed)
Your lab work is within acceptable range and there are no concerning findings.   ?

## 2022-11-03 ENCOUNTER — Telehealth: Payer: Self-pay

## 2022-11-03 DIAGNOSIS — M797 Fibromyalgia: Secondary | ICD-10-CM

## 2022-11-03 DIAGNOSIS — F5101 Primary insomnia: Secondary | ICD-10-CM

## 2022-11-03 LAB — DRUG MONITORING, PANEL 6 WITH CONFIRMATION, URINE
6 Acetylmorphine: NEGATIVE ng/mL (ref <10)
Alcohol Metabolites: NEGATIVE ng/mL (ref <500)
Amphetamines: NEGATIVE ng/mL (ref <500)
Barbiturates: NEGATIVE ng/mL (ref <300)
Benzodiazepines: NEGATIVE ng/mL (ref <100)
Cocaine Metabolite: NEGATIVE ng/mL (ref <150)
Codeine: NEGATIVE ng/mL (ref <50)
Creatinine: 104.4 mg/dL (ref >=20.0)
Hydrocodone: NEGATIVE ng/mL (ref <50)
Hydromorphone: NEGATIVE ng/mL (ref <50)
Marijuana Metabolite: NEGATIVE ng/mL (ref <20)
Methadone Metabolite: NEGATIVE ng/mL (ref <100)
Morphine: NEGATIVE ng/mL (ref <50)
Norhydrocodone: NEGATIVE ng/mL (ref <50)
Noroxycodone: 3104 ng/mL — ABNORMAL HIGH (ref <50)
Opiates: NEGATIVE ng/mL (ref <100)
Oxidant: NEGATIVE ug/mL (ref <200)
Oxycodone: 1421 ng/mL — ABNORMAL HIGH (ref <50)
Oxycodone: POSITIVE ng/mL — AB (ref <100)
Oxymorphone: 1292 ng/mL — ABNORMAL HIGH (ref <50)
Phencyclidine: NEGATIVE ng/mL (ref <25)
pH: 5.7 (ref 4.5–9.0)

## 2022-11-03 LAB — DM TEMPLATE

## 2022-11-03 MED ORDER — GABAPENTIN 300 MG PO CAPS: 300.0000 mg | ORAL_CAPSULE | Freq: Two times a day (BID) | ORAL | 1 refills | Status: DC

## 2022-11-03 NOTE — Progress Notes (Signed)
   11/03/2022  Patient ID: Annette Ramirez, female   DOB: 04-01-1946, 77 y.o.   MRN: [id number]  Patient outreach to inform Annette Ramirez that Breztri PAP needs her to call to verify her physical address for shipping of the medication.  I also informed her that her gabapentin was changed to 300mg  BID.  I asked patient if she could recall who last prescribed quetiapine, and she believes it to be Annette Ramirez.  She states she is down to 8 tablets of the 50mg  tablets; so taking 3qhs, she will run out Friday.  Will consult Annette Ramirez regarding refills.    Annette Ramirez, PharmD, DPLA

## 2022-11-04 LAB — BASIC METABOLIC PANEL WITHOUT GFR
BUN: 14 mg/dL (ref 7–25)
Chloride: 99 mmol/L (ref 98–110)
Creat: 0.74 mg/dL (ref 0.60–1.00)
Glucose, Bld: 93 mg/dL (ref 65–99)
Sodium: 134 mmol/L — ABNORMAL LOW (ref 135–146)
eGFR: 82 mL/min/{1.73_m2} (ref >=60)

## 2022-11-04 LAB — VITAMIN B1: Vitamin B1 (Thiamine): 130 nmol/L — ABNORMAL HIGH (ref 8–30)

## 2022-11-05 MED ORDER — QUETIAPINE FUMARATE 150 MG PO TABS
150.0000 mg | ORAL_TABLET | Freq: Every day | ORAL | 1 refills | Status: DC
Start: 2022-11-05 — End: 2023-05-06

## 2022-11-05 NOTE — Addendum Note (Signed)
Addended by: Nani Gasser D on: 11/05/2022 12:39 PM   Modules accepted: Orders

## 2022-11-05 NOTE — Telephone Encounter (Signed)
Meds ordered this encounter  Medications   QUEtiapine 150 MG TABS    Sig: Take 150 mg by mouth at bedtime.    Dispense:  90 tablet    Refill:  1

## 2022-11-05 NOTE — Progress Notes (Signed)
Hi Annette Ramirez, sodium is just borderline by 1 point not in a worrisome range but I do want to keep an eye on it.  Vitamin B1 is actually really high so if you are taking a B1 supplement I would actually discontinue it at this point.  If you take too much it can cause some problems just like having too little.  And your levels are pretty high at this point.

## 2022-11-11 NOTE — Progress Notes (Signed)
Receive shipment letter for AZ[employer]ME  for [facility] and has scanned into media of chart   Please be advised  Melanee Spry CPhT [facility] [phone]

## 2022-11-14 DIAGNOSIS — I10 Essential (primary) hypertension: Secondary | ICD-10-CM | POA: Diagnosis not present

## 2022-11-14 DIAGNOSIS — J9601 Acute respiratory failure with hypoxia: Secondary | ICD-10-CM | POA: Diagnosis not present

## 2022-11-14 DIAGNOSIS — J441 Chronic obstructive pulmonary disease with (acute) exacerbation: Secondary | ICD-10-CM | POA: Diagnosis not present

## 2022-11-14 DIAGNOSIS — E871 Hypo-osmolality and hyponatremia: Secondary | ICD-10-CM | POA: Diagnosis not present

## 2022-11-15 ENCOUNTER — Other Ambulatory Visit: Payer: Medicare Other | Admitting: Pharmacist

## 2022-11-15 ENCOUNTER — Telehealth: Payer: Medicare Other

## 2022-11-15 ENCOUNTER — Ambulatory Visit: Payer: Self-pay

## 2022-11-15 NOTE — Patient Instructions (Signed)
Visit Information  Thank you for taking time to visit with me today. Please don't hesitate to contact me if I can be of assistance to you.   Following are the goals we discussed today:  Continue to attend provider visits as scheduled/recommended Continue to take medications as prescribed Contact Provider if any health questions or concerns    If you are experiencing a Mental Health or Behavioral Health Crisis or need someone to talk to, please call the Suicide and Crisis Lifeline: 59  Kathyrn Sheriff, RN, MSN, BSN, CCM [facility] Care Coordinator [phone]

## 2022-11-15 NOTE — Patient Outreach (Signed)
  Care Coordination   Follow Up Visit Note   11/15/2022 Name: Annette Ramirez MRN: [id number] DOB: 1946-03-12  Annette Ramirez is a 77 y.o. year old female who sees Metheney, Barbarann Ehlers, MD for primary care. I spoke with  Annette Ramirez by phone today.  What matters to the patients health and wellness today?  Annette Ramirez reports, "I am doing good". She states she is sleeping better. She denies any problems with breathing. And reports she routinely checks her blood pressure and states they are in an acceptable range. She denies any questions or concerns. She denies any care coordination, resource or disease management needs at this time.    Goals Addressed             This Visit's Progress    COMPLETED: Continue to improve post hospitalization       Interventions Today    Flowsheet Row Most Recent Value  Chronic Disease   Chronic disease during today's visit Chronic Obstructive Pulmonary Disease (COPD)  General Interventions   General Interventions Discussed/Reviewed General Interventions Reviewed, Doctor Visits  [Provided RNCM contact number and encouraged to contact RNCM or PCP, if care coordination needs in the future.]  Doctor Visits Discussed/Reviewed Doctor Visits Reviewed, PCP  PCP/Specialist Visits Compliance with follow-up visit  Education Interventions   Education Provided Provided Education  Provided Verbal Education On Other  [advised to continue to take medications as prescribed, attend provider visits as scheduled, contact provider if any health questions or concerns.]            SDOH assessments and interventions completed:  No  Care Coordination Interventions:  Yes, provided   Follow up plan: No further intervention required.   Encounter Outcome:  Pt. Visit Completed   Kathyrn Sheriff, RN, MSN, BSN, CCM [facility] Care Coordinator [phone]

## 2022-12-02 ENCOUNTER — Other Ambulatory Visit: Payer: Self-pay | Admitting: Family Medicine

## 2022-12-02 DIAGNOSIS — E785 Hyperlipidemia, unspecified: Secondary | ICD-10-CM

## 2022-12-02 DIAGNOSIS — K219 Gastro-esophageal reflux disease without esophagitis: Secondary | ICD-10-CM

## 2022-12-03 ENCOUNTER — Other Ambulatory Visit: Payer: Self-pay | Admitting: Family Medicine

## 2022-12-03 DIAGNOSIS — G8929 Other chronic pain: Secondary | ICD-10-CM

## 2022-12-06 ENCOUNTER — Telehealth: Payer: Self-pay | Admitting: Family Medicine

## 2022-12-06 NOTE — Telephone Encounter (Signed)
Last OV: 11/01/22 Next OV: 02/01/23 Last RF: 11/01/22

## 2022-12-06 NOTE — Telephone Encounter (Signed)
Patient called in for refill for Rx: OxyCodone  5-325MG . Patient states the pharmacy doesn't have any prescription refills. Please advise.  [facility] [address] [phone]

## 2022-12-08 NOTE — Telephone Encounter (Signed)
Medication sent.

## 2022-12-13 DIAGNOSIS — J329 Chronic sinusitis, unspecified: Secondary | ICD-10-CM | POA: Diagnosis not present

## 2022-12-13 DIAGNOSIS — I10 Essential (primary) hypertension: Secondary | ICD-10-CM | POA: Diagnosis not present

## 2022-12-13 DIAGNOSIS — Z9981 Dependence on supplemental oxygen: Secondary | ICD-10-CM | POA: Diagnosis not present

## 2022-12-13 DIAGNOSIS — Z87891 Personal history of nicotine dependence: Secondary | ICD-10-CM | POA: Diagnosis not present

## 2022-12-13 DIAGNOSIS — R0902 Hypoxemia: Secondary | ICD-10-CM | POA: Diagnosis not present

## 2022-12-13 DIAGNOSIS — J449 Chronic obstructive pulmonary disease, unspecified: Secondary | ICD-10-CM | POA: Diagnosis not present

## 2022-12-13 DIAGNOSIS — R918 Other nonspecific abnormal finding of lung field: Secondary | ICD-10-CM | POA: Diagnosis not present

## 2022-12-15 DIAGNOSIS — I10 Essential (primary) hypertension: Secondary | ICD-10-CM | POA: Diagnosis not present

## 2022-12-15 DIAGNOSIS — J9601 Acute respiratory failure with hypoxia: Secondary | ICD-10-CM | POA: Diagnosis not present

## 2022-12-15 DIAGNOSIS — E871 Hypo-osmolality and hyponatremia: Secondary | ICD-10-CM | POA: Diagnosis not present

## 2022-12-15 DIAGNOSIS — J441 Chronic obstructive pulmonary disease with (acute) exacerbation: Secondary | ICD-10-CM | POA: Diagnosis not present

## 2022-12-30 ENCOUNTER — Other Ambulatory Visit: Payer: Self-pay | Admitting: Family Medicine

## 2022-12-30 DIAGNOSIS — I10 Essential (primary) hypertension: Secondary | ICD-10-CM

## 2023-01-05 ENCOUNTER — Other Ambulatory Visit: Payer: Self-pay | Admitting: Family Medicine

## 2023-01-05 DIAGNOSIS — G8929 Other chronic pain: Secondary | ICD-10-CM

## 2023-01-05 DIAGNOSIS — M545 Low back pain, unspecified: Secondary | ICD-10-CM

## 2023-02-01 ENCOUNTER — Ambulatory Visit: Payer: Medicare Other

## 2023-02-01 ENCOUNTER — Encounter: Payer: Self-pay | Admitting: Family Medicine

## 2023-02-01 ENCOUNTER — Ambulatory Visit (INDEPENDENT_AMBULATORY_CARE_PROVIDER_SITE_OTHER): Payer: Medicare Other | Admitting: Family Medicine

## 2023-02-01 VITALS — BP 94/58 | HR 59 | Ht 62.0 in | Wt 130.0 lb

## 2023-02-01 DIAGNOSIS — G8929 Other chronic pain: Secondary | ICD-10-CM | POA: Diagnosis not present

## 2023-02-01 DIAGNOSIS — R059 Cough, unspecified: Secondary | ICD-10-CM | POA: Diagnosis not present

## 2023-02-01 DIAGNOSIS — I959 Hypotension, unspecified: Secondary | ICD-10-CM

## 2023-02-01 DIAGNOSIS — M545 Low back pain, unspecified: Secondary | ICD-10-CM | POA: Diagnosis not present

## 2023-02-01 DIAGNOSIS — R0602 Shortness of breath: Secondary | ICD-10-CM | POA: Diagnosis not present

## 2023-02-01 DIAGNOSIS — J441 Chronic obstructive pulmonary disease with (acute) exacerbation: Secondary | ICD-10-CM | POA: Diagnosis not present

## 2023-02-01 DIAGNOSIS — F1190 Opioid use, unspecified, uncomplicated: Secondary | ICD-10-CM | POA: Diagnosis not present

## 2023-02-01 LAB — CMP14+EGFR
ALT: 7 [IU]/L (ref 0–32)
AST: 13 [IU]/L (ref 0–40)
Albumin: 4.2 g/dL (ref 3.8–4.8)
Alkaline Phosphatase: 57 [IU]/L (ref 44–121)
BUN/Creatinine Ratio: 12 (ref 12–28)
BUN: 10 mg/dL (ref 8–27)
Bilirubin Total: 0.5 mg/dL (ref 0.0–1.2)
CO2: 27 mmol/L (ref 20–29)
Calcium: 9.2 mg/dL (ref 8.7–10.3)
Chloride: 99 mmol/L (ref 96–106)
Creatinine, Ser: 0.86 mg/dL (ref 0.57–1.00)
Globulin, Total: 1.8 g/dL (ref 1.5–4.5)
Glucose: 104 mg/dL — ABNORMAL HIGH (ref 70–99)
Potassium: 4.6 mmol/L (ref 3.5–5.2)
Sodium: 136 mmol/L (ref 134–144)
Total Protein: 6 g/dL (ref 6.0–8.5)
eGFR: 69 mL/min/{1.73_m2} (ref >59)

## 2023-02-01 LAB — CBC WITH DIFFERENTIAL/PLATELET
Basophils Absolute: 0 10*3/uL (ref 0.0–0.2)
Basos: 1 %
EOS (ABSOLUTE): 0.4 10*3/uL (ref 0.0–0.4)
Eos: 5 %
Hematocrit: 40.4 % (ref 34.0–46.6)
Hemoglobin: 13.3 g/dL (ref 11.1–15.9)
Immature Granulocytes: 1 %
Lymphocytes Absolute: 1.5 10*3/uL (ref 0.7–3.1)
Lymphs: 23 %
MCH: 33.3 pg — ABNORMAL HIGH (ref 26.6–33.0)
MCHC: 32.9 g/dL (ref 31.5–35.7)
MCV: 101 fL — ABNORMAL HIGH (ref 79–97)
Monocytes Absolute: 0.8 10*3/uL (ref 0.1–0.9)
Monocytes: 12 %
Neutrophils Absolute: 3.9 10*3/uL (ref 1.4–7.0)
Neutrophils: 58 %
Platelets: 187 10*3/uL (ref 150–450)
RBC: 3.99 x10E6/uL (ref 3.77–5.28)
RDW: 11.8 % (ref 11.7–15.4)
WBC: 6.7 10*3/uL (ref 3.4–10.8)

## 2023-02-01 MED ORDER — OXYCODONE-ACETAMINOPHEN 5-325 MG PO TABS: 2.0000 | ORAL_TABLET | Freq: Every day | ORAL | 0 refills | Status: DC

## 2023-02-01 MED ORDER — OXYCODONE-ACETAMINOPHEN 5-325 MG PO TABS
2.0000 | ORAL_TABLET | Freq: Every day | ORAL | 0 refills | Status: DC
Start: 2023-02-01 — End: 2023-02-01

## 2023-02-01 MED ORDER — DOXYCYCLINE HYCLATE 100 MG PO TABS: 100.0000 mg | ORAL_TABLET | Freq: Two times a day (BID) | ORAL | 0 refills | Status: DC

## 2023-02-01 MED ORDER — PREDNISONE 20 MG PO TABS
ORAL_TABLET | ORAL | 0 refills | Status: AC
Start: 2023-02-01 — End: 2023-02-09

## 2023-02-01 NOTE — Progress Notes (Addendum)
Established Patient Office Visit  Subjective   Patient ID: Annette Ramirez, female    DOB: 01-08-1946  Age: 77 y.o. MRN: [id number]  Chief Complaint  Patient presents with   Pain Management    HPI  Follow-up chronic pain management-she is on oxycodone and gabapentin for chronic back pain and peripheral neuropathy.  She is here today for 22-month follow-up.  Has been using her nebulizer more lately because has had more mucous in her chest.  Says about 6 days ago she just darted feeling worse she could tell that she was getting ready to get sick she noticed that she was more tired, increased sputum production, increased cough.  In the last couple days she has had more nasal drainage.  No fever chills or sweats.    ROS    Objective:     BP (!) 94/58   Pulse (!) 59   Ht 5\' 2"  (1.575 m)   Wt 130 lb (59 kg)   SpO2 95% Comment: 3L  BMI 23.78 kg/m    Physical Exam Constitutional:      Appearance: Normal appearance.  HENT:     Head: Normocephalic and atraumatic.     Right Ear: Tympanic membrane, ear canal and external ear normal. There is no impacted cerumen.     Left Ear: Tympanic membrane, ear canal and external ear normal. There is no impacted cerumen.     Nose: Nose normal.     Mouth/Throat:     Pharynx: Oropharynx is clear.  Eyes:     Conjunctiva/sclera: Conjunctivae normal.  Cardiovascular:     Rate and Rhythm: Normal rate and regular rhythm.  Pulmonary:     Effort: Pulmonary effort is normal.     Breath sounds: Normal breath sounds.  Musculoskeletal:     Cervical back: Neck supple. No tenderness.  Lymphadenopathy:     Cervical: No cervical adenopathy.  Skin:    General: Skin is warm and dry.  Neurological:     Mental Status: She is alert and oriented to person, place, and time.  Psychiatric:        Mood and Affect: Mood normal.      Results for orders placed or performed in visit on 02/01/23  CBC with Differential/Platelet  Result Value Ref Range    WBC 6.7 3.4 - 10.8 x10E3/uL   RBC 3.99 3.77 - 5.28 x10E6/uL   Hemoglobin 13.3 11.1 - 15.9 g/dL   Hematocrit 95.2 84.1 - 46.6 %   MCV 101 (H) 79 - 97 fL   MCH 33.3 (H) 26.6 - 33.0 pg   MCHC 32.9 31.5 - 35.7 g/dL   RDW 32.4 40.1 - 02.7 %   Platelets 187 150 - 450 x10E3/uL   Neutrophils 58 Not Estab. %   Lymphs 23 Not Estab. %   Monocytes 12 Not Estab. %   Eos 5 Not Estab. %   Basos 1 Not Estab. %   Neutrophils Absolute 3.9 1.4 - 7.0 x10E3/uL   Lymphocytes Absolute 1.5 0.7 - 3.1 x10E3/uL   Monocytes Absolute 0.8 0.1 - 0.9 x10E3/uL   EOS (ABSOLUTE) 0.4 0.0 - 0.4 x10E3/uL   Basophils Absolute 0.0 0.0 - 0.2 x10E3/uL   Immature Granulocytes 1 Not Estab. %  CMP14+EGFR  Result Value Ref Range   Glucose 104 (H) 70 - 99 mg/dL   BUN 10 8 - 27 mg/dL   Creatinine, Ser 2.53 0.57 - 1.00 mg/dL   eGFR 69 >66 YQ/IHK/7.42   BUN/Creatinine Ratio 12 12 -  28   Sodium 136 134 - 144 mmol/L   Potassium 4.6 3.5 - 5.2 mmol/L   Chloride 99 96 - 106 mmol/L   CO2 27 20 - 29 mmol/L   Calcium 9.2 8.7 - 10.3 mg/dL   Total Protein 6.0 6.0 - 8.5 g/dL   Albumin 4.2 3.8 - 4.8 g/dL   Globulin, Total 1.8 1.5 - 4.5 g/dL   Bilirubin Total 0.5 0.0 - 1.2 mg/dL   Alkaline Phosphatase 57 44 - 121 IU/L   AST 13 0 - 40 IU/L   ALT 7 0 - 32 IU/L      The ASCVD Risk score (Arnett DK, et al., 2019) failed to calculate for the following reasons:   The valid total cholesterol range is 130 to 320 mg/dL    Assessment & Plan:   Problem List Items Addressed This Visit       Other   Opiate use - Primary   Encounter for chronic pain management    Indication for chronic opioid: chronic back pain, neuropathy, and fibromyalgia Medication and dose: oxycodone-acetaminophen 5-325 mg tab # pills per month: 60 Last UDS date: 11/01/22 Opioid Treatment Agreement signed (Y/N): yes Opioid Treatment Agreement last reviewed with patient:  02/01/23 NCCSRS reviewed this encounter (include red flags):   Yes- no red flags.  F/U : 3  months      Relevant Medications   oxyCODONE-acetaminophen (PERCOCET/ROXICET) 5-325 MG tablet (Start on 03/03/2023)   oxyCODONE-acetaminophen (PERCOCET/ROXICET) 5-325 MG tablet (Start on 04/02/2023)   Chronic lower back pain   Relevant Medications   predniSONE (DELTASONE) 20 MG tablet   oxyCODONE-acetaminophen (PERCOCET/ROXICET) 5-325 MG tablet (Start on 03/03/2023)   oxyCODONE-acetaminophen (PERCOCET/ROXICET) 5-325 MG tablet (Start on 04/02/2023)   Other Visit Diagnoses     COPD exacerbation (HCC)       Relevant Medications   predniSONE (DELTASONE) 20 MG tablet   Other Relevant Orders   DG Chest 2 View (Completed)   CBC with Differential/Platelet (Completed)   CMP14+EGFR   Hypotension, unspecified hypotension type          Hypotension-repeat blood pressure was a little bit better but I do think this could be contributing to some of her fatigue some going to have her skip her blood pressure medicines until she is feeling better and her blood pressure comes up.  She does have a bottle of water here today in the room so did encourage her to hydrate and drink.  COPD exacerbation-we will go ahead and send over a course of prednisone will get chest x-ray for further workup today we will check a CBC with differential.  Return in about 3 months (around 05/04/2023) for Chronic Pain mgt.    Nani Gasser, MD

## 2023-02-01 NOTE — Addendum Note (Signed)
Addended by: Nani Gasser D on: 02/01/2023 04:58 PM   Modules accepted: Orders

## 2023-02-01 NOTE — Patient Instructions (Addendum)
Hold your blood pressure pills until she is feeling better. ( Amlodipine, atenolol, and losartan)

## 2023-02-01 NOTE — Progress Notes (Signed)
Hi Annette Ramirez, great news no sign of pneumonia some gena send over prescription for doxycycline for your lungs I already sent the prednisone over so they should hopefully be getting that when ready for you.  Continue to hold your blood pressure medication until your blood pressure comes up greater than 130.

## 2023-02-01 NOTE — Assessment & Plan Note (Addendum)
Indication for chronic opioid: chronic back pain, neuropathy, and fibromyalgia Medication and dose: oxycodone-acetaminophen 5-325 mg tab # pills per month: 60 Last UDS date: 11/01/22 Opioid Treatment Agreement signed (Y/N): yes Opioid Treatment Agreement last reviewed with patient:  02/01/23 NCCSRS reviewed this encounter (include red flags):   Yes- no red flags.  F/U : 3 months

## 2023-02-01 NOTE — Addendum Note (Signed)
Addended by: Nani Gasser D on: 02/01/2023 04:25 PM   Modules accepted: Orders

## 2023-02-01 NOTE — Progress Notes (Signed)
Your lab work is within acceptable range and there are no concerning findings.   ?

## 2023-02-04 NOTE — Progress Notes (Signed)
OK continue to hold the medication and make sure drinking at least 50oz of fluid a day

## 2023-03-08 DIAGNOSIS — F17211 Nicotine dependence, cigarettes, in remission: Secondary | ICD-10-CM | POA: Diagnosis not present

## 2023-03-08 DIAGNOSIS — I10 Essential (primary) hypertension: Secondary | ICD-10-CM | POA: Diagnosis not present

## 2023-03-08 DIAGNOSIS — R0902 Hypoxemia: Secondary | ICD-10-CM | POA: Diagnosis not present

## 2023-03-08 DIAGNOSIS — Z9981 Dependence on supplemental oxygen: Secondary | ICD-10-CM | POA: Diagnosis not present

## 2023-03-08 DIAGNOSIS — J019 Acute sinusitis, unspecified: Secondary | ICD-10-CM | POA: Diagnosis not present

## 2023-03-08 DIAGNOSIS — R0602 Shortness of breath: Secondary | ICD-10-CM | POA: Diagnosis not present

## 2023-03-08 DIAGNOSIS — R918 Other nonspecific abnormal finding of lung field: Secondary | ICD-10-CM | POA: Diagnosis not present

## 2023-03-08 DIAGNOSIS — B9689 Other specified bacterial agents as the cause of diseases classified elsewhere: Secondary | ICD-10-CM | POA: Diagnosis not present

## 2023-03-08 DIAGNOSIS — J432 Centrilobular emphysema: Secondary | ICD-10-CM | POA: Diagnosis not present

## 2023-03-08 DIAGNOSIS — J479 Bronchiectasis, uncomplicated: Secondary | ICD-10-CM | POA: Diagnosis not present

## 2023-03-14 ENCOUNTER — Other Ambulatory Visit: Payer: Self-pay | Admitting: Family Medicine

## 2023-03-14 DIAGNOSIS — I10 Essential (primary) hypertension: Secondary | ICD-10-CM

## 2023-03-16 ENCOUNTER — Telehealth: Payer: Self-pay | Admitting: Family Medicine

## 2023-03-16 NOTE — Telephone Encounter (Signed)
Patient dropped off document  Physician Statement Form , to be filled out by provider. Patient requested to send it back via Call Patient to pick up within ASAP. Document is located in providers tray at front office.Please advise at [facility] [phone]

## 2023-03-17 NOTE — Telephone Encounter (Signed)
Called and lvm asking pt to rtn call to get more information to complete the form.

## 2023-03-17 NOTE — Telephone Encounter (Signed)
Not sure what dx code needed

## 2023-03-18 NOTE — Telephone Encounter (Signed)
Pt needed form completed for her COPD. She had to cancel her flight to [facility] and requesting refund from the airline agency.   Form completed ready to pick up.   Need scan to chart  Placed in Tonya B basket

## 2023-03-23 NOTE — Telephone Encounter (Signed)
Pt's husband has p/u this form.

## 2023-04-11 ENCOUNTER — Encounter: Payer: Self-pay | Admitting: Family Medicine

## 2023-04-11 ENCOUNTER — Ambulatory Visit (INDEPENDENT_AMBULATORY_CARE_PROVIDER_SITE_OTHER): Payer: Medicare Other | Admitting: Family Medicine

## 2023-04-11 VITALS — Ht 62.0 in | Wt 134.0 lb

## 2023-04-11 DIAGNOSIS — Z0000 Encounter for general adult medical examination without abnormal findings: Secondary | ICD-10-CM

## 2023-04-11 DIAGNOSIS — Z0100 Encounter for examination of eyes and vision without abnormal findings: Secondary | ICD-10-CM

## 2023-04-11 NOTE — Progress Notes (Signed)
Subjective:   Annette Ramirez is a 77 y.o. female who presents for Medicare Annual (Subsequent) preventive examination.  Visit Complete: Virtual I connected with  Annette Ramirez on 04/11/23 by a audio enabled telemedicine application and verified that I am speaking with the correct person using two identifiers.  Patient Location: Home  Provider Location: Office/Clinic  I discussed the limitations of evaluation and management by telemedicine. The patient expressed understanding and agreed to proceed.  Vital Signs: Because this visit was a virtual/telehealth visit, some criteria may be missing or patient reported. Any vitals not documented were not able to be obtained and vitals that have been documented are patient reported.  Patient Medicare AWV questionnaire was completed by the patient on 04/11/23; I have confirmed that all information answered by patient is correct and no changes since this date.  Cardiac Risk Factors include: advanced age (>43men, >20 women);sedentary lifestyle     Objective:    Today's Vitals   04/11/23 1049  Weight: 134 lb (60.8 kg)  Height: 5\' 2"  (1.575 m)  PainSc: 4    Body mass index is 24.51 kg/m.     04/11/2023   11:02 AM 04/05/2022   11:09 AM 01/14/2021    8:46 AM 07/16/2019    1:07 PM 07/11/2018    2:08 PM 10/01/2013    1:23 PM 02/28/2013    6:15 PM  Advanced Directives  Does Patient Have a Medical Advance Directive? Yes Yes Yes Yes Yes Patient has advance directive, copy not in chart   Type of Advance Directive Living will Living will Living will;Healthcare Power of [employer] Power of [place];Living will Healthcare Power of [place];Living will Healthcare Power of [place];Living will Advance instruction for mental health treatment  Does patient want to make changes to medical advance directive? No - Patient declined No - Patient declined No - Patient declined No - Patient declined No - Patient declined    Copy of Healthcare Power of  Attorney in Chart?   No - copy requested No - copy requested No - copy requested      Current Medications (verified) Outpatient Encounter Medications as of 04/11/2023  Medication Sig   albuterol (VENTOLIN HFA) 108 (90 Base) MCG/ACT inhaler Inhale 2 puffs into the lungs every 6 (six) hours as needed for wheezing.   Budeson-Glycopyrrol-Formoterol (BREZTRI AEROSPHERE) 160-9-4.8 MCG/ACT AERO Inhale 2 puffs into the lungs in the morning and at bedtime.   gabapentin (NEURONTIN) 300 MG capsule Take 1 capsule (300 mg total) by mouth 2 (two) times daily.   ipratropium-albuterol (DUONEB) 0.5-2.5 (3) MG/3ML SOLN Take 3 mLs by nebulization every 4 (four) hours as needed. States taking twice a day   MAGNESIUM PO Take 1 tablet by mouth daily. 250mg  daily   omeprazole (PRILOSEC) 40 MG capsule TAKE 1 CAPSULE BY MOUTH DAILY   oxybutynin (DITROPAN-XL) 10 MG 24 hr tablet TAKE 1 TABLET BY MOUTH AT  BEDTIME   oxyCODONE-acetaminophen (PERCOCET/ROXICET) 5-325 MG tablet Take 2 tablets by mouth daily.   oxyCODONE-acetaminophen (PERCOCET/ROXICET) 5-325 MG tablet Take 2 tablets by mouth daily.   QUEtiapine 150 MG TABS Take 150 mg by mouth at bedtime.   amLODipine (NORVASC) 5 MG tablet TAKE 1 TABLET BY MOUTH DAILY (Patient not taking: Reported on 04/11/2023)   atenolol (TENORMIN) 100 MG tablet TAKE 1 TABLET BY MOUTH TWICE  DAILY (Patient not taking: Reported on 04/11/2023)   atorvastatin (LIPITOR) 20 MG tablet TAKE 1 TABLET BY MOUTH ONCE  DAILY (Patient not taking: Reported on  04/11/2023)   doxycycline (VIBRA-TABS) 100 MG tablet Take 1 tablet (100 mg total) by mouth 2 (two) times daily. (Patient not taking: Reported on 04/11/2023)   losartan (COZAAR) 100 MG tablet Take 1 tablet (100 mg total) by mouth daily. (Patient not taking: Reported on 04/11/2023)   thiamine (VITAMIN B-1) 100 MG tablet Take 100 mg by mouth daily. (Patient not taking: Reported on 04/11/2023)   vitamin B-12 (CYANOCOBALAMIN) 100 MCG tablet Take 100 mcg by  mouth daily. (Patient not taking: Reported on 04/11/2023)   No facility-administered encounter medications on file as of 04/11/2023.    Allergies (verified) Amoxicillin-pot clavulanate, Aspirin, Codeine, Hctz [hydrochlorothiazide], Nsaids, and Other   History: Past Medical History:  Diagnosis Date   Anxiety    Asthma    COPD (chronic obstructive pulmonary disease) (HCC)    Depression    Ductal carcinoma in situ (DCIS) of right breast 08/16/2019   Exertional shortness of breath    Family history of anesthesia complication    "granddaughter has PONV" (02/28/2013)   Fibromyalgia    GERD (gastroesophageal reflux disease)    H/O hiatal hernia    Hypertension    Migraines    "in the past; they've stopped" (02/28/2013)   Opiate use 06/06/2017   Osteoporosis    PONV (postoperative nausea and vomiting)    Sinus headache    Past Surgical History:  Procedure Laterality Date   ABDOMINAL HYSTERECTOMY  05/03/1973   APPENDECTOMY  05/03/1973   BREAST CYST ASPIRATION Left 01/02/1979   COLONOSCOPY W/ BIOPSIES AND POLYPECTOMY  05/03/2010   DILATION AND CURETTAGE OF UTERUS  01/02/1959   EXCISIONAL HEMORRHOIDECTOMY  01/02/1979   LUMBAR LAMINECTOMY  02/28/2013   LUMBAR LAMINECTOMY/DECOMPRESSION MICRODISCECTOMY N/A 02/28/2013   Procedure: LUMBAR LAMINECTOMY/DECOMPRESSION MICRODISCECTOMY/Lumbar 4-5 decompression;  Surgeon: Emilee Hero, MD;  Location: [facility] OR;  Service: Orthopedics;  Laterality: N/A;  Lumbar 4-5 decompression   MASTECTOMY Left 01/2021   MASTECTOMY Right 08/21/2019   TONSILLECTOMY  05/03/1948   TUBAL LIGATION  05/03/1970   Family History  Problem Relation Age of Onset   Alzheimer's disease Mother    Prostate cancer Other        grandfather   Stroke Other        grandparent   Social History   Socioeconomic History   Marital status: Married    Spouse name: Nadine Counts   Number of children: 2   Years of education: 13   Highest education level: Some college, no degree   Occupational History   Occupation: worked for [employer]    Comment: retired  Tobacco Use   Smoking status: Former    Current packs/day: 0.00    Average packs/day: 0.3 packs/day for 40.0 years (10.0 ttl pk-yrs)    Types: Cigarettes    Start date: 02/01/1980    Quit date: 02/01/2020    Years since quitting: 3.1    Passive exposure: Never   Smokeless tobacco: Former  Building services engineer status: Never Used  Substance and Sexual Activity   Alcohol use: Not Currently    Alcohol/week: 3.0 standard drinks of alcohol    Types: 3 Glasses of wine per week    Comment: occasionally social   Drug use: No   Sexual activity: Not Currently  Other Topics Concern   Not on file  Social History Narrative   Lives with her husband. She recently lost her daughter and is in support group for grief. She enjoys cooking.    Social Determinants of Health  Financial Resource Strain: Low Risk  (04/11/2023)   Overall Financial Resource Strain (CARDIA)    Difficulty of Paying Living Expenses: Not hard at all  Food Insecurity: No Food Insecurity (04/11/2023)   Hunger Vital Sign    Worried About Running Out of Food in the Last Year: Never true    Ran Out of Food in the Last Year: Never true  Transportation Needs: No Transportation Needs (04/11/2023)   PRAPARE - Administrator, Civil Service (Medical): No    Lack of Transportation (Non-Medical): No  Physical Activity: Inactive (04/05/2022)   Exercise Vital Sign    Days of Exercise per Week: 0 days    Minutes of Exercise per Session: 0 min  Stress: No Stress Concern Present (04/11/2023)   [employer] of Occupational Health - Occupational Stress Questionnaire    Feeling of Stress : Not at all  Social Connections: Moderately Isolated (04/11/2023)   Social Connection and Isolation Panel [NHANES]    Frequency of Communication with Friends and Family: More than three times a week    Frequency of Social Gatherings with Friends and Family: Three  times a week    Attends Religious Services: Never    Active Member of Clubs or Organizations: No    Attends Banker Meetings: Never    Marital Status: Married    Tobacco Counseling Counseling given: Not Answered   Clinical Intake:  Pre-visit preparation completed: No  Pain : 0-10 Pain Score: 4  Pain Type: Chronic pain Pain Location: Back (all over) Pain Orientation: Other (Comment) (generalized muscle and back pain) Pain Descriptors / Indicators: Constant Pain Onset: More than a month ago Pain Frequency: Constant Pain Relieving Factors: pain pills Effect of Pain on Daily Activities: affects daily activities  Pain Relieving Factors: pain pills  BMI - recorded: 24.5 Nutritional Status: BMI of 19-24  Normal Nutritional Risks: None Diabetes: No  How often do you need to have someone help you when you read instructions, pamphlets, or other written materials from your doctor or pharmacy?: 1 - Never What is the last grade level you completed in school?: 13  Interpreter Needed?: No      Activities of Daily Living    04/11/2023   10:52 AM  In your present state of health, do you have any difficulty performing the following activities:  Hearing? 0  Vision? 0  Difficulty concentrating or making decisions? 0  Walking or climbing stairs? 1  Dressing or bathing? 0  Doing errands, shopping? 1  Comment husband drives her to appointments, uses oxygen  Preparing Food and eating ? N  Using the Toilet? N  In the past six months, have you accidently leaked urine? N  Do you have problems with loss of bowel control? N  Managing your Medications? N  Managing your Finances? N  Housekeeping or managing your Housekeeping? N    Patient Care Team: Agapito Games, MD as PCP - General (Family Medicine) Gabriel Carina, [facility] (Inactive) as Pharmacist (Pharmacist) Clydie Braun, [facility] Chest  Dellis Anes, hematology/oncology     Indicate any recent Medical  Services you may have received from other than [facility] providers in the past year (date may be approximate).     Assessment:   This is a routine wellness examination for Lashaun.  Hearing/Vision screen Hearing Screening - Comments:: Unable to test, grossly intact.  Vision Screening - Comments:: Unable to test, grossly intact, wears reading glasses    Goals Addressed  This Visit's Progress    Mobility and Independence Optimized       Evidence-based guidance:  Refer to physical therapy or occupational therapy for assessment and individualized program.  Provide therapy that may include functional task training, balance, active or passive exercise, spasticity management, assistive device training, cardiorespiratory fitness, Pilates and telerehabilitation services.  Identify barriers to participation in therapy or exercise such as pain with activity, anticipated or imagined pain, transportation, depression or fear.  Work with patient and family to develop self-management plan to remove barriers.  Refer to speech/language pathologist to assess and treat speech/language deficit and swallowing impairment.  Periodically review ability to perform activities of daily living and required amount of assistance needed for safety, optimal independence and self-care.  Encourage appropriate vocational or educational counseling for re-entering the community, workplace or school; consider a driving evaluation.  Assist patient to advocate for necessary adaptations to the work or school environment.  Refer to employer for information about FMLA (Family and Medical Leave Act), Short or Long-Term Disability and options for adjustments to work schedule, assignment or hours.   Notes:       Depression Screen    04/11/2023   11:00 AM 08/02/2022    2:04 PM 04/05/2022   11:09 AM 01/27/2022   11:52 AM 07/17/2021    2:04 PM 03/16/2021    2:35 PM 01/14/2021    8:47 AM  PHQ 2/9 Scores  PHQ - 2 Score 2 0 0 2  2 1 2   PHQ- 9 Score 6  6 5 4 6 8     Fall Risk    04/11/2023   11:03 AM 09/20/2022    2:42 PM 08/02/2022    2:04 PM 04/05/2022   11:09 AM 01/27/2022    8:50 AM  Fall Risk   Falls in the past year? 0 0  0 0  Number falls in past yr: 0 0 0 0 0  Injury with Fall? 0 0 0 0 0  Risk for fall due to : No Fall Risks No Fall Risks No Fall Risks No Fall Risks No Fall Risks  Follow up  Falls evaluation completed Falls evaluation completed Falls evaluation completed Falls evaluation completed    MEDICARE RISK AT HOME: Medicare Risk at Home Any stairs in or around the home?: Yes If so, are there any without handrails?: Yes Home free of loose throw rugs in walkways, pet beds, electrical cords, etc?: Yes Adequate lighting in your home to reduce risk of falls?: Yes Life alert?: No Use of a cane, walker or w/c?: No Grab bars in the bathroom?: Yes Shower chair or bench in shower?: Yes Elevated toilet seat or a handicapped toilet?: No  TIMED UP AND GO:  Was the test performed?  No    Cognitive Function:        04/11/2023   11:06 AM 04/11/2023   11:04 AM 04/05/2022   11:20 AM 01/14/2021    8:56 AM 07/16/2019    1:12 PM  6CIT Screen  What Year? 0 points 0 points 0 points 0 points 0 points  What month? 0 points  0 points 0 points 0 points  What time? 0 points 0 points 0 points 0 points 0 points  Count back from 20 0 points 0 points 0 points 0 points 0 points  Months in reverse 0 points 0 points 0 points 0 points 0 points  Repeat phrase 0 points 0 points 0 points 0 points 2 points  Total Score 0 points  0 points 0 points 2 points    Immunizations Immunization History  Administered Date(s) Administered   Fluad Quad(high Dose 65+) 04/19/2019, 01/09/2020, 02/01/2020, 01/14/2021, 01/27/2022   Influenza Split 03/22/2012   Influenza Whole 03/10/2006, 02/01/2008   Influenza, High Dose Seasonal PF 01/06/2017, 02/01/2018   Influenza,inj,Quad PF,6+ Mos 03/01/2013, 02/14/2014, 12/27/2014, 02/19/2016,  12/19/2017   Influenza-Unspecified 04/19/2019   PFIZER(Purple Top)SARS-COV-2 Vaccination 06/23/2019, 07/16/2019   PNEUMOCOCCAL CONJUGATE-20 01/27/2022, 11/01/2022   Pneumococcal Conjugate-13 10/01/2013   Pneumococcal Polysaccharide-23 09/25/2008   Td 05/03/2004   Tdap 05/13/2015   Zoster, Live 05/30/2015    TDAP status: Up to date  Flu Vaccine status: Up to date  Pneumococcal vaccine status: Up to date  Covid-19 vaccine status: Declined, Education has been provided regarding the importance of this vaccine but patient still declined. Advised may receive this vaccine at local pharmacy or Health Dept.or vaccine clinic. Aware to provide a copy of the vaccination record if obtained from local pharmacy or Health Dept. Verbalized acceptance and understanding.  Qualifies for Shingles Vaccine? No   Zostavax completed Yes   Shingrix Completed?: No.    Education has been provided regarding the importance of this vaccine. Patient has been advised to call insurance company to determine out of pocket expense if they have not yet received this vaccine. Advised may also receive vaccine at local pharmacy or Health Dept. Verbalized acceptance and understanding.  Screening Tests Health Maintenance  Topic Date Due   COVID-19 Vaccine (3 - Pfizer risk series) 08/13/2019   Zoster Vaccines- Shingrix (1 of 2) 04/29/2023 (Originally 07/25/1964)   Medicare Annual Wellness (AWV)  04/10/2024   Colonoscopy  05/22/2024   DTaP/Tdap/Td (3 - Td or Tdap) 05/12/2025   Pneumonia Vaccine 39+ Years old  Completed   INFLUENZA VACCINE  Completed   Hepatitis C Screening  Completed   HPV VACCINES  Aged Out    Health Maintenance  Health Maintenance Due  Topic Date Due   COVID-19 Vaccine (3 - Pfizer risk series) 08/13/2019    Colorectal cancer screening: Type of screening: Colonoscopy. Completed 05/23/19. Repeat every aged out  years  Mammogram status: No longer required due to mastectomy and aged out .  Bone  density: last 08/08/2019. Declines need for another referral.   Lung Cancer Screening: (Low Dose CT Chest recommended if Age 50-80 years, 20 pack-year currently smoking OR have quit w/in 15years.) does qualify.   Lung Cancer Screening Referral: reports low dose CT scan in last 6 months per pulmonary specialist.   Additional Screening:  Hepatitis C Screening: does qualify; Completed 05/29/2015  Vision Screening: Recommended annual ophthalmology exams for early detection of glaucoma and other disorders of the eye. Is the patient up to date with their annual eye exam?  No  Who is the provider or what is the name of the office in which the patient attends annual eye exams? Needs referral  If pt is not established with a provider, would they like to be referred to a provider to establish care? Yes .   Dental Screening: Recommended annual dental exams for proper oral hygiene  Diabetic Foot Exam: n/a   Community Resource Referral / Chronic Care Management: CRR required this visit?  No   CCM required this visit?  No     Plan:     I have personally reviewed and noted the following in the patient's chart:   Medical and social history Use of alcohol, tobacco or illicit drugs  Current medications and supplements including opioid prescriptions. Patient  is currently taking opioid prescriptions. Information provided to patient regarding non-opioid alternatives. Patient advised to discuss non-opioid treatment plan with their provider. PCP manages pain.  Functional ability and status Nutritional status Physical activity Advanced directives List of other physicians Hospitalizations, surgeries, and ER visits in previous 12 months Vitals Screenings to include cognitive, depression, and falls Referrals and appointments  In addition, I have reviewed and discussed with patient certain preventive protocols, quality metrics, and best practice recommendations. A written personalized care plan for  preventive services as well as general preventive health recommendations were provided to patient.     Novella Olive, FNP   04/11/2023   After Visit Summary: (MyChart) Due to this being a telephonic visit, the after visit summary with patients personalized plan was offered to patient via MyChart   Follow-up with PCP as scheduled. Wonders if insurance will pay for portable oxygen. Will call insurance company to find out if this is covered benefit.  Declines referral for bone density. Placed referral for ophthalmology. Declines need for Covid vaccine.

## 2023-04-21 ENCOUNTER — Other Ambulatory Visit: Payer: Self-pay | Admitting: Family Medicine

## 2023-04-21 DIAGNOSIS — M797 Fibromyalgia: Secondary | ICD-10-CM

## 2023-05-05 ENCOUNTER — Ambulatory Visit (INDEPENDENT_AMBULATORY_CARE_PROVIDER_SITE_OTHER): Payer: Medicare Other | Admitting: Family Medicine

## 2023-05-05 ENCOUNTER — Encounter: Payer: Self-pay | Admitting: Family Medicine

## 2023-05-05 VITALS — BP 130/60 | HR 90 | Ht 62.0 in | Wt 136.0 lb

## 2023-05-05 DIAGNOSIS — G8929 Other chronic pain: Secondary | ICD-10-CM

## 2023-05-05 DIAGNOSIS — I10 Essential (primary) hypertension: Secondary | ICD-10-CM | POA: Diagnosis not present

## 2023-05-05 DIAGNOSIS — N1831 Chronic kidney disease, stage 3a: Secondary | ICD-10-CM

## 2023-05-05 DIAGNOSIS — M545 Low back pain, unspecified: Secondary | ICD-10-CM | POA: Diagnosis not present

## 2023-05-05 DIAGNOSIS — J449 Chronic obstructive pulmonary disease, unspecified: Secondary | ICD-10-CM

## 2023-05-05 DIAGNOSIS — E782 Mixed hyperlipidemia: Secondary | ICD-10-CM | POA: Diagnosis not present

## 2023-05-05 MED ORDER — OXYCODONE-ACETAMINOPHEN 5-325 MG PO TABS: 2.0000 | ORAL_TABLET | Freq: Every day | ORAL | 0 refills | Status: DC

## 2023-05-05 MED ORDER — LOSARTAN POTASSIUM 25 MG PO TABS: 12.5000 mg | ORAL_TABLET | Freq: Every day | ORAL | 1 refills | Status: AC

## 2023-05-05 NOTE — Assessment & Plan Note (Addendum)
Indication for chronic opioid: chronic back pain, neuropathy, and fibromyalgia Medication and dose: oxycodone-acetaminophen 5-325 mg tab # pills per month: 60 Last UDS date: 11/01/22 Opioid Treatment Agreement signed (Y/N): yes Opioid Treatment Agreement last reviewed with patient:  02/01/23 NCCSRS reviewed this encounter (include red flags):   Yes- no red flags.  F/U : 3 months

## 2023-05-05 NOTE — Assessment & Plan Note (Signed)
 Blood pressure actually looks great off of all of her medications but I would like to restart a low-dose of her ARB just for renal protection.  Removed the amlodipine 5 mg and atenolol 100 mg and losartan 100 mg from her medication list.

## 2023-05-05 NOTE — Progress Notes (Signed)
 Established Patient Office Visit  Subjective  Patient ID: Annette Ramirez, female    DOB: May 07, 1945  Age: 78 y.o. MRN: [id number]  Chief Complaint  Patient presents with   Pain Management    HPI  Follow-up chronic pain management-she is on oxycodone  and gabapentin  for chronic back pain and peripheral neuropathy.  She is here today for 68-month follow-up.   F/U low BP -last time she was here her blood pressures were little low.  I had her hold her medication and increase her fluids until she felt better. She has still held the medication and BPs have been better  She is on ABX and steroid for a COPD exacerbation now.  She has upcoming appt with Pul later this month.  She uses her flutter valve daily and uses her Nebulizer BID.     ROS    Objective:     BP 130/60   Pulse 90   Ht 5' 2 (1.575 m)   Wt 136 lb (61.7 kg)   SpO2 95% Comment: 3L  BMI 24.87 kg/m    Physical Exam Vitals and nursing note reviewed.  Constitutional:      Appearance: Normal appearance.  HENT:     Head: Normocephalic and atraumatic.  Eyes:     Conjunctiva/sclera: Conjunctivae normal.  Cardiovascular:     Rate and Rhythm: Normal rate and regular rhythm.  Pulmonary:     Effort: Pulmonary effort is normal.     Breath sounds: Normal breath sounds.  Skin:    General: Skin is warm and dry.  Neurological:     Mental Status: She is alert.  Psychiatric:        Mood and Affect: Mood normal.      No results found for any visits on 05/05/23.    The ASCVD Risk score (Arnett DK, et al., 2019) failed to calculate for the following reasons:   The valid total cholesterol range is 130 to 320 mg/dL    Assessment & Plan:   Problem List Items Addressed This Visit       Cardiovascular and Mediastinum   HYPERTENSION, BENIGN ESSENTIAL - Primary   Blood pressure actually looks great off of all of her medications but I would like to restart a low-dose of her ARB just for renal protection.  Removed  the amlodipine  5 mg and atenolol  100 mg and losartan  100 mg from her medication list.      Relevant Medications   losartan  (COZAAR ) 25 MG tablet   Other Relevant Orders   CMP14+EGFR   Lipid panel   CBC     Respiratory   Stage 3 severe COPD by GOLD classification (HCC)   He really might be a great candidate for prophylaxis since she has been getting very frequent COPD flares and needing courses of antibiotics and steroids.        Genitourinary   CKD (chronic kidney disease) stage 3, GFR 30-59 ml/min (HCC)   Relevant Medications   losartan  (COZAAR ) 25 MG tablet   Other Relevant Orders   CMP14+EGFR   Lipid panel   CBC     Other   Encounter for chronic pain management   Indication for chronic opioid: chronic back pain, neuropathy, and fibromyalgia  Medication and dose: oxycodone -acetaminophen  5-325 mg tab # pills per month: 60 Last UDS date: 11/01/22 Opioid Treatment Agreement signed (Y/N): yes Opioid Treatment Agreement last reviewed with patient:  02/01/23 [facility] reviewed this encounter (include red flags):   Yes- no red flags.  F/U : 3 months          Relevant Medications   oxyCODONE -acetaminophen  (PERCOCET/ROXICET) 5-325 MG tablet (Start on 06/04/2023)   oxyCODONE -acetaminophen  (PERCOCET/ROXICET) 5-325 MG tablet (Start on 07/02/2023)   oxyCODONE -acetaminophen  (PERCOCET/ROXICET) 5-325 MG tablet   Other Relevant Orders   Drug Screen, Ur (12+Oxycodone +Crt)   Elevated triglycerides with high cholesterol   Due to recheck lipids.      Relevant Medications   losartan  (COZAAR ) 25 MG tablet   Other Relevant Orders   CMP14+EGFR   Lipid panel   CBC   Chronic lower back pain   Relevant Medications   oxyCODONE -acetaminophen  (PERCOCET/ROXICET) 5-325 MG tablet (Start on 06/04/2023)   oxyCODONE -acetaminophen  (PERCOCET/ROXICET) 5-325 MG tablet (Start on 07/02/2023)   oxyCODONE -acetaminophen  (PERCOCET/ROXICET) 5-325 MG tablet    Return in about 3 months (around 08/03/2023) for Chronic  Pain Mgt.    Dorothyann Byars, MD

## 2023-05-05 NOTE — Assessment & Plan Note (Signed)
Due to recheck lipids. 

## 2023-05-05 NOTE — Assessment & Plan Note (Signed)
 He really might be a great candidate for prophylaxis since she has been getting very frequent COPD flares and needing courses of antibiotics and steroids.

## 2023-05-06 ENCOUNTER — Telehealth: Payer: Self-pay | Admitting: Emergency Medicine

## 2023-05-06 ENCOUNTER — Other Ambulatory Visit: Payer: Self-pay | Admitting: Emergency Medicine

## 2023-05-06 ENCOUNTER — Other Ambulatory Visit: Payer: Self-pay | Admitting: *Deleted

## 2023-05-06 DIAGNOSIS — M797 Fibromyalgia: Secondary | ICD-10-CM

## 2023-05-06 DIAGNOSIS — F5101 Primary insomnia: Secondary | ICD-10-CM

## 2023-05-06 LAB — CMP14+EGFR
ALT: 11 [IU]/L (ref 0–32)
AST: 19 [IU]/L (ref 0–40)
Albumin: 4.8 g/dL (ref 3.8–4.8)
Alkaline Phosphatase: 57 [IU]/L (ref 44–121)
BUN/Creatinine Ratio: 14 (ref 12–28)
BUN: 13 mg/dL (ref 8–27)
Bilirubin Total: 0.4 mg/dL (ref 0.0–1.2)
CO2: 23 mmol/L (ref 20–29)
Calcium: 9.3 mg/dL (ref 8.7–10.3)
Chloride: 97 mmol/L (ref 96–106)
Creatinine, Ser: 0.92 mg/dL (ref 0.57–1.00)
Globulin, Total: 1.8 g/dL (ref 1.5–4.5)
Glucose: 112 mg/dL — ABNORMAL HIGH (ref 70–99)
Potassium: 4.3 mmol/L (ref 3.5–5.2)
Sodium: 138 mmol/L (ref 134–144)
Total Protein: 6.6 g/dL (ref 6.0–8.5)
eGFR: 63 mL/min/{1.73_m2} (ref >59)

## 2023-05-06 LAB — CBC
Hematocrit: 36.2 % (ref 34.0–46.6)
Hemoglobin: 12.1 g/dL (ref 11.1–15.9)
MCH: 32.7 pg (ref 26.6–33.0)
MCHC: 33.4 g/dL (ref 31.5–35.7)
MCV: 98 fL — ABNORMAL HIGH (ref 79–97)
Platelets: 228 10*3/uL (ref 150–450)
RBC: 3.7 x10E6/uL — ABNORMAL LOW (ref 3.77–5.28)
RDW: 11.7 % (ref 11.7–15.4)
WBC: 7.2 10*3/uL (ref 3.4–10.8)

## 2023-05-06 LAB — LIPID PANEL
Chol/HDL Ratio: 1.8 {ratio} (ref 0.0–4.4)
Cholesterol, Total: 197 mg/dL (ref 100–199)
HDL: 110 mg/dL (ref >39)
LDL Chol Calc (NIH): 72 mg/dL (ref 0–99)
Triglycerides: 85 mg/dL (ref 0–149)
VLDL Cholesterol Cal: 15 mg/dL (ref 5–40)

## 2023-05-06 LAB — MED LIST OPTION NOT SELECTED

## 2023-05-06 MED ORDER — OXYBUTYNIN CHLORIDE ER 10 MG PO TB24: 10.0000 mg | ORAL_TABLET | Freq: Every day | ORAL | 2 refills | Status: DC

## 2023-05-06 MED ORDER — QUETIAPINE FUMARATE 150 MG PO TABS: 150.0000 mg | ORAL_TABLET | Freq: Every day | ORAL | 1 refills | Status: DC

## 2023-05-06 NOTE — Progress Notes (Signed)
 Patient states she would like a refill on her remeron.

## 2023-05-06 NOTE — Progress Notes (Signed)
 Your lab work is within acceptable range and there are no concerning findings.   ?

## 2023-05-13 LAB — DRUG SCREEN, UR (12+OXYCODONE+CRT)
Amphetamine Scrn, Ur: NEGATIVE ng/mL
BARBITURATE SCREEN URINE: NEGATIVE ng/mL
BENZODIAZEPINE SCREEN, URINE: NEGATIVE ng/mL
CANNABINOIDS UR QL SCN: NEGATIVE ng/mL
Cocaine (Metab) Scrn, Ur: NEGATIVE ng/mL
Creatinine(Crt), U: 99.1 mg/dL (ref 20.0–300.0)
Fentanyl, Urine: NEGATIVE
Meperidine Screen, Urine: NEGATIVE ng/mL
Methadone Screen, Urine: NEGATIVE ng/mL
OXYCODONE+OXYMORPHONE UR QL SCN: POSITIVE — AB
Opiate Scrn, Ur: NEGATIVE ng/mL
Ph of Urine: 5.3 (ref 4.5–8.9)
Phencyclidine Qn, Ur: NEGATIVE ng/mL
Propoxyphene Scrn, Ur: NEGATIVE ng/mL
SPECIFIC GRAVITY: 1.013
Tramadol Screen, Urine: NEGATIVE ng/mL

## 2023-05-27 NOTE — Telephone Encounter (Signed)
close

## 2023-05-30 DIAGNOSIS — R918 Other nonspecific abnormal finding of lung field: Secondary | ICD-10-CM | POA: Diagnosis not present

## 2023-05-30 DIAGNOSIS — R0902 Hypoxemia: Secondary | ICD-10-CM | POA: Diagnosis not present

## 2023-05-30 DIAGNOSIS — Z9981 Dependence on supplemental oxygen: Secondary | ICD-10-CM | POA: Diagnosis not present

## 2023-05-30 DIAGNOSIS — J432 Centrilobular emphysema: Secondary | ICD-10-CM | POA: Diagnosis not present

## 2023-05-30 DIAGNOSIS — J449 Chronic obstructive pulmonary disease, unspecified: Secondary | ICD-10-CM | POA: Diagnosis not present

## 2023-05-30 DIAGNOSIS — F17211 Nicotine dependence, cigarettes, in remission: Secondary | ICD-10-CM | POA: Diagnosis not present

## 2023-05-30 DIAGNOSIS — J479 Bronchiectasis, uncomplicated: Secondary | ICD-10-CM | POA: Diagnosis not present

## 2023-07-28 ENCOUNTER — Telehealth: Payer: Self-pay

## 2023-07-28 ENCOUNTER — Ambulatory Visit (INDEPENDENT_AMBULATORY_CARE_PROVIDER_SITE_OTHER)

## 2023-07-28 ENCOUNTER — Ambulatory Visit
Admission: EM | Admit: 2023-07-28 | Discharge: 2023-07-28 | Disposition: A | Discharge status: Home / Self Care | Attending: Family Medicine | Admitting: Family Medicine

## 2023-07-28 DIAGNOSIS — M79671 Pain in right foot: Secondary | ICD-10-CM

## 2023-07-28 DIAGNOSIS — M21611 Bunion of right foot: Secondary | ICD-10-CM | POA: Diagnosis not present

## 2023-07-28 DIAGNOSIS — M2011 Hallux valgus (acquired), right foot: Secondary | ICD-10-CM

## 2023-07-28 DIAGNOSIS — M19071 Primary osteoarthritis, right ankle and foot: Secondary | ICD-10-CM | POA: Diagnosis not present

## 2023-07-28 DIAGNOSIS — S93134A Subluxation of interphalangeal joint of right lesser toe(s), initial encounter: Secondary | ICD-10-CM | POA: Diagnosis not present

## 2023-07-28 NOTE — ED Provider Notes (Signed)
 [facility]    CSN: [id number] Arrival date & time: 07/28/23  1232      History   Chief Complaint Chief Complaint  Patient presents with   Foot Pain    HPI Annette Ramirez is a 78 y.o. female.   HPI 78 year old female presents with right foot pain since earlier this morning and denies injury.  Patient reports felt similar to neuropathy and has applied topical cortisone with no improvement.  Patient reports increase in size and veins over foot and ankle.  PMH significant for fibromyalgia, idiopathic peripheral neuropathy, HTN, and current opiate use.  Additionally, PMH significant for polymyositis and stage III severe COPD and is currently on 3 L via nasal cannula.  Patient is currently on oxycodone and gabapentin.  Past Medical History:  Diagnosis Date   Anxiety    Asthma    COPD (chronic obstructive pulmonary disease) (HCC)    Depression    Ductal carcinoma in situ (DCIS) of right breast 08/16/2019   Exertional shortness of breath    Family history of anesthesia complication    "granddaughter has PONV" (02/28/2013)   Fibromyalgia    GERD (gastroesophageal reflux disease)    H/O hiatal hernia    Hypertension    Migraines    "in the past; they've stopped" (02/28/2013)   Opiate use 06/06/2017   Osteoporosis    PONV (postoperative nausea and vomiting)    Sinus headache     Patient Active Problem List   Diagnosis Date Noted   Encounter for chronic pain management 05/05/2023   Thiamine deficiency 05/11/2022   Bilateral calf pain 01/14/2021   Urge incontinence of urine 10/28/2020   Weak urinary stream 08/22/2020   Grief 06/10/2020   Hypoxemia 10/03/2019   CKD (chronic kidney disease) stage 3, GFR 30-59 ml/min (HCC) 08/19/2019   Ductal carcinoma in situ (DCIS) of right breast 08/16/2019   Osteopenia 08/10/2019   Elevated triglycerides with high cholesterol 06/07/2019   OAB (overactive bladder) 04/19/2019   Opiate use 06/06/2017   Idiopathic peripheral  neuropathy 01/06/2017   Hyponatremia 10/21/2016   Facet arthritis of lumbar region 07/13/2016   Encounter for vision screening 04/05/2016   Chronic lower back pain 03/15/2016   History of adenomatous polyp of colon 10/03/2015   Risk for coronary artery disease between 10% and 20% in next 10 years 05/30/2015   Arteriosclerosis of aorta (HCC) 03/17/2015   Thoracic neuritis 07/25/2012   Stage 3 severe COPD by GOLD classification (HCC) 07/02/2010   MDD (major depressive disorder), recurrent episode, mild (HCC) 02/13/2009   Hearing loss 09/25/2008   GERD 10/10/2007   INSOMNIA 03/21/2007   ANXIETY DISORDER, GENERALIZED 03/10/2006   Former smoker 03/10/2006   HYPERTENSION, BENIGN ESSENTIAL 03/10/2006   Fibromyalgia 03/10/2006   Osteoporosis 03/10/2006   Polymyositis (HCC) 03/10/2006    Past Surgical History:  Procedure Laterality Date   ABDOMINAL HYSTERECTOMY  05/03/1973   APPENDECTOMY  05/03/1973   BREAST CYST ASPIRATION Left 01/02/1979   COLONOSCOPY W/ BIOPSIES AND POLYPECTOMY  05/03/2010   DILATION AND CURETTAGE OF UTERUS  01/02/1959   EXCISIONAL HEMORRHOIDECTOMY  01/02/1979   LUMBAR LAMINECTOMY  02/28/2013   LUMBAR LAMINECTOMY/DECOMPRESSION MICRODISCECTOMY N/A 02/28/2013   Procedure: LUMBAR LAMINECTOMY/DECOMPRESSION MICRODISCECTOMY/Lumbar 4-5 decompression;  Surgeon: Emilee Hero, MD;  Location: [facility] OR;  Service: Orthopedics;  Laterality: N/A;  Lumbar 4-5 decompression   MASTECTOMY Left 01/2021   MASTECTOMY Right 08/21/2019   TONSILLECTOMY  05/03/1948   TUBAL LIGATION  05/03/1970    OB History  No obstetric history on file.      Home Medications    Prior to Admission medications   Medication Sig Start Date End Date Taking? Authorizing Provider  albuterol (VENTOLIN HFA) 108 (90 Base) MCG/ACT inhaler Inhale 2 puffs into the lungs every 6 (six) hours as needed for wheezing. 09/20/22   Agapito Games, MD  atorvastatin (LIPITOR) 20 MG tablet TAKE 1 TABLET BY  MOUTH ONCE  DAILY 12/06/22   Agapito Games, MD  Budeson-Glycopyrrol-Formoterol (BREZTRI AEROSPHERE) 160-9-4.8 MCG/ACT AERO Inhale 2 puffs into the lungs in the morning and at bedtime. 10/18/22   Agapito Games, MD  gabapentin (NEURONTIN) 100 MG capsule TAKE 2 CAPSULES BY MOUTH IN THE  MORNING 1 CAPSULE BY MOUTH AT  NOON AND 3 CAPSULES BY MOUTH AT  BEDTIME 04/22/23   Agapito Games, MD  ipratropium-albuterol (DUONEB) 0.5-2.5 (3) MG/3ML SOLN Take 3 mLs by nebulization every 4 (four) hours as needed. States taking twice a day    [provider]  losartan (COZAAR) 25 MG tablet Take 0.5 tablets (12.5 mg total) by mouth daily. 05/05/23   Agapito Games, MD  MAGNESIUM PO Take 1 tablet by mouth daily. 250mg  daily    [provider]  omeprazole (PRILOSEC) 40 MG capsule TAKE 1 CAPSULE BY MOUTH DAILY 12/06/22   Agapito Games, MD  oxybutynin (DITROPAN-XL) 10 MG 24 hr tablet Take 1 tablet (10 mg total) by mouth at bedtime. 05/06/23   Agapito Games, MD  oxyCODONE-acetaminophen (PERCOCET/ROXICET) 5-325 MG tablet Take 2 tablets by mouth daily. 06/04/23   Agapito Games, MD  oxyCODONE-acetaminophen (PERCOCET/ROXICET) 5-325 MG tablet Take 2 tablets by mouth daily. 07/02/23   Agapito Games, MD  oxyCODONE-acetaminophen (PERCOCET/ROXICET) 5-325 MG tablet Take 2 tablets by mouth daily. 05/05/23   Agapito Games, MD  QUEtiapine Fumarate 150 MG TABS Take 150 mg by mouth at bedtime. 05/06/23   Agapito Games, MD    Family History Family History  Problem Relation Age of Onset   Alzheimer's disease Mother    Prostate cancer Other        grandfather   Stroke Other        grandparent    Social History Social History   Tobacco Use   Smoking status: Former    Current packs/day: 0.00    Average packs/day: 0.3 packs/day for 40.0 years (10.0 ttl pk-yrs)    Types: Cigarettes    Start date: 02/01/1980    Quit date: 02/01/2020    Years since  quitting: 3.4    Passive exposure: Never   Smokeless tobacco: Former  Building services engineer status: Never Used  Substance Use Topics   Alcohol use: Not Currently    Alcohol/week: 3.0 standard drinks of alcohol    Types: 3 Glasses of wine per week    Comment: occasionally social   Drug use: No     Allergies   Amoxicillin-pot clavulanate, Aspirin, Codeine, Hctz [hydrochlorothiazide], Nsaids, and Other   Review of Systems Review of Systems  Musculoskeletal:        Right foot pain since this morning     Physical Exam Triage Vital Signs ED Triage Vitals  Encounter Vitals Group     BP      Systolic BP Percentile      Diastolic BP Percentile      Pulse      Resp      Temp      Temp src  SpO2      Weight      Height      Head Circumference      Peak Flow      Pain Score      Pain Loc      Pain Education      Exclude from Growth Chart    No data found.  Updated Vital Signs BP (!) 160/86   Pulse 92   Temp 97.9 F (36.6 C)   Resp 16   SpO2 94%    Physical Exam Vitals and nursing note reviewed.  Constitutional:      Appearance: Normal appearance. She is normal weight.  HENT:     Head: Normocephalic and atraumatic.     Mouth/Throat:     Mouth: Mucous membranes are moist.     Pharynx: Oropharynx is clear.  Eyes:     Extraocular Movements: Extraocular movements intact.     Conjunctiva/sclera: Conjunctivae normal.     Pupils: Pupils are equal, round, and reactive to light.  Cardiovascular:     Rate and Rhythm: Normal rate and regular rhythm.     Pulses: Normal pulses.     Heart sounds: Normal heart sounds.  Pulmonary:     Effort: Pulmonary effort is normal.     Breath sounds: Normal breath sounds. No wheezing, rhonchi or rales.  Musculoskeletal:        General: Normal range of motion.     Cervical back: Normal range of motion and neck supple.     Comments: Patient complaining of severe pain over dorsum and sole of the right foot  Skin:    General:  Skin is warm and dry.  Neurological:     General: No focal deficit present.     Mental Status: She is alert and oriented to person, place, and time. Mental status is at baseline.  Psychiatric:        Mood and Affect: Mood normal.        Behavior: Behavior normal.      UC Treatments / Results  Labs (all labs ordered are listed, but only abnormal results are displayed) Labs Reviewed - No data to display  EKG   Radiology DG Foot Complete Right Result Date: 07/28/2023 CLINICAL DATA:  Foot pain EXAM: RIGHT FOOT COMPLETE - 3+ VIEW COMPARISON:  None Available. FINDINGS: No fracture is visualized. Hallux valgus deformity at the first MTP joint with mild degenerative changes and mild bunion formation at head of first metatarsal. Mild lateral subluxation base of fifth middle phalanx with respect to the proximal phalanx. Moderate degenerative changes at the fifth IP joint. IMPRESSION: 1. No acute osseous abnormality. 2. Hallux valgus deformity at the first MTP joint with mild degenerative changes. 3. Moderate degenerative changes at the fifth IP joint with mild lateral subluxation base of fifth middle phalanx. Electronically Signed   By: Jasmine Pang M.D.   On: 07/28/2023 15:17    Procedures Procedures (including critical care time)  Medications Ordered in UC Medications - No data to display  Initial Impression / Assessment and Plan / UC Course  I have reviewed the triage vital signs and the nursing notes.  Pertinent labs & imaging results that were available during my care of the patient were reviewed by me and considered in my medical decision making (see chart for details).     MDM: 1.  Foot pain, right-right foot x-ray results revealed above.  Advised patient given severe stage III COPD she has to be extremely  careful in using pain medication as this will lower her current respiratory rate.  Patient is currently taking gabapentin and oxycodone. Advised/instructed patient given severe  stage III COPD she has to be extremely careful in using pain medication as this will lower her current respiratory rate.  Patient may take currently prescribed oxycodone for right foot pain daily as needed.  Advised if symptoms worsen and/or unresolved please follow-up with your PCP or here for further evaluation. Final Clinical Impressions(s) / UC Diagnoses   Final diagnoses:  Foot pain, right     Discharge Instructions      Advised/instructed patient given severe stage III COPD she has to be extremely careful in using pain medication as this will lower her current respiratory rate.  Patient may take currently prescribed oxycodone for right foot pain daily as needed.  Advised if symptoms worsen and/or unresolved please follow-up with your PCP or here for further evaluation.     ED Prescriptions   None    I have reviewed the PDMP during this encounter.   Trevor Iha, FNP 07/28/23 1549

## 2023-07-28 NOTE — Discharge Instructions (Addendum)
 Advised/instructed patient given severe stage III COPD she has to be extremely careful in using pain medication as this will lower her current respiratory rate.  Patient may take currently prescribed oxycodone for right foot pain daily as needed.  Advised if symptoms worsen and/or unresolved please follow-up with your PCP or here for further evaluation.

## 2023-07-28 NOTE — ED Triage Notes (Addendum)
 Pt presents to [facility] with co right foot pain since early this morning. Pt reports it felt similar to neuropathy and applied her topical cream with no improbement. Pt also reports increased size of the veins in there foot and ankle

## 2023-08-01 ENCOUNTER — Telehealth: Payer: Self-pay | Admitting: Family Medicine

## 2023-08-01 MED ORDER — HYDROCODONE-ACETAMINOPHEN 7.5-325 MG PO TABS: ORAL_TABLET | ORAL | 0 refills | Status: DC

## 2023-08-01 NOTE — Telephone Encounter (Signed)
 Medication sent to pharmacy per patient request.  ?

## 2023-08-04 ENCOUNTER — Ambulatory Visit (INDEPENDENT_AMBULATORY_CARE_PROVIDER_SITE_OTHER): Payer: Medicare Other | Admitting: Family Medicine

## 2023-08-04 ENCOUNTER — Telehealth: Payer: Self-pay

## 2023-08-04 ENCOUNTER — Encounter: Payer: Self-pay | Admitting: Family Medicine

## 2023-08-04 VITALS — BP 128/77 | HR 83

## 2023-08-04 DIAGNOSIS — M51362 Other intervertebral disc degeneration, lumbar region with discogenic back pain and lower extremity pain: Secondary | ICD-10-CM

## 2023-08-04 DIAGNOSIS — M797 Fibromyalgia: Secondary | ICD-10-CM

## 2023-08-04 DIAGNOSIS — M545 Low back pain, unspecified: Secondary | ICD-10-CM | POA: Diagnosis not present

## 2023-08-04 DIAGNOSIS — I10 Essential (primary) hypertension: Secondary | ICD-10-CM | POA: Diagnosis not present

## 2023-08-04 DIAGNOSIS — G8929 Other chronic pain: Secondary | ICD-10-CM | POA: Diagnosis not present

## 2023-08-04 DIAGNOSIS — M51369 Other intervertebral disc degeneration, lumbar region without mention of lumbar back pain or lower extremity pain: Secondary | ICD-10-CM | POA: Insufficient documentation

## 2023-08-04 MED ORDER — GABAPENTIN 300 MG PO CAPS: 300.0000 mg | ORAL_CAPSULE | Freq: Three times a day (TID) | ORAL | 1 refills | Status: DC

## 2023-08-04 MED ORDER — OXYCODONE-ACETAMINOPHEN 5-325 MG PO TABS: 1.0000 | ORAL_TABLET | Freq: Three times a day (TID) | ORAL | 0 refills | Status: DC | PRN

## 2023-08-04 MED ORDER — OXYCODONE-ACETAMINOPHEN 5-325 MG PO TABS: 2.0000 | ORAL_TABLET | Freq: Every day | ORAL | 0 refills | Status: DC

## 2023-08-04 NOTE — Progress Notes (Signed)
 Established Patient Office Visit  Subjective  Patient ID: Annette Ramirez, female    DOB: 10-Jan-1946  Age: 78 y.o. MRN: [id number]  Chief Complaint  Patient presents with   Pain Management    HPI  Follow-up chronic pain management for chronic bilateral low back pain and peripheral neuropathy.  She is here today for 1-month follow-up.  Currently on 5 mg oxycodone twice a day as well as gabapentin.-she reports that she has had an elevation in her pain levels lately.  She says now she just feels like she constantly hurts particularly in her low back.  She also feels like she has had a fibro flare recently and has just had more diffuse pain in general she says she just gets to the point where it is hard to sleep and rest because of it.  She feels like her neuropathy has gotten worse lately as well.  She says sometimes when she first lays down her feet and legs feel more numb but then it gets painful to the point that it will actually wake her up out of her sleep.    ROS    Objective:     BP 128/77 (BP Location: Left Arm, Patient Position: Sitting, Cuff Size: Large)   Pulse 83    Physical Exam Vitals and nursing note reviewed.  Constitutional:      Appearance: Normal appearance.  HENT:     Head: Normocephalic and atraumatic.  Eyes:     Conjunctiva/sclera: Conjunctivae normal.  Cardiovascular:     Rate and Rhythm: Normal rate and regular rhythm.  Pulmonary:     Effort: Pulmonary effort is normal.     Breath sounds: Normal breath sounds.  Skin:    General: Skin is warm and dry.  Neurological:     Mental Status: She is alert.  Psychiatric:        Mood and Affect: Mood normal.      No results found for any visits on 08/04/23.    The ASCVD Risk score (Arnett DK, et al., 2019) failed to calculate for the following reasons:   The 2019 ASCVD risk score is only valid for ages 16 to 28    Assessment & Plan:   Problem List Items Addressed This Visit        Cardiovascular and Mediastinum   HYPERTENSION, BENIGN ESSENTIAL   Blood pressure looks fantastic today continue current regimen.        Musculoskeletal and Integument   Degenerative disc disease, lumbar   Relevant Orders   MR Lumbar Spine Wo Contrast     Other   Fibromyalgia - Primary   Again her fiber symptoms seem to be flaring as well.  We did discuss that narcotic pain medication is actually not helpful for fibro.  We could certainly look at adjusting her gabapentin which could be helpful for her fibromyalgia and for her neuropathy.  Will increase dose to 300 mg 3 times daily.      Relevant Medications   oxyCODONE-acetaminophen (PERCOCET/ROXICET) 5-325 MG tablet   gabapentin (NEURONTIN) 300 MG capsule   oxyCODONE-acetaminophen (PERCOCET/ROXICET) 5-325 MG tablet (Start on 09/02/2023)   oxyCODONE-acetaminophen (PERCOCET/ROXICET) 5-325 MG tablet (Start on 10/02/2023)   Other Relevant Orders   Drug Screen 12+Alcohol+CRT, Ur   Encounter for chronic pain management   Indication for chronic opioid: chronic back pain, neuropathy, and fibromyalgia   Medication and dose: oxycodone-acetaminophen 5-325 mg tab # pills per month: 90 Last UDS date: 11/01/22 Opioid Treatment Agreement signed (Y/N):  yes Opioid Treatment Agreement last reviewed with patient:  08/04/23 N[facility] reviewed this encounter (include red flags):   Yes- no red flags.  F/U : 3 months          Relevant Medications   oxyCODONE-acetaminophen (PERCOCET/ROXICET) 5-325 MG tablet   oxyCODONE-acetaminophen (PERCOCET/ROXICET) 5-325 MG tablet (Start on 09/02/2023)   oxyCODONE-acetaminophen (PERCOCET/ROXICET) 5-325 MG tablet (Start on 10/02/2023)   Other Relevant Orders   Drug Screen 12+Alcohol+CRT, Ur   Chronic lower back pain   She does feel like her symptoms have been worse lately.  I am concerned that her pain seems to have progressed and she also feels like her neuropathy is worsening at the same time that is concerning that she may  have some worsening issues in her spine I do think it warrants further investigation.  We discussed getting some up-to-date imaging.  Last MRI of the spine was in 2017 and the last time she had facet injections was in 2019.      Relevant Medications   oxyCODONE-acetaminophen (PERCOCET/ROXICET) 5-325 MG tablet   gabapentin (NEURONTIN) 300 MG capsule   oxyCODONE-acetaminophen (PERCOCET/ROXICET) 5-325 MG tablet (Start on 09/02/2023)   oxyCODONE-acetaminophen (PERCOCET/ROXICET) 5-325 MG tablet (Start on 10/02/2023)   Other Relevant Orders   MR Lumbar Spine Wo Contrast    Return in about 3 months (around 11/03/2023) for Chronic Pain Mgt.    Nani Gasser, MD

## 2023-08-04 NOTE — Assessment & Plan Note (Signed)
 Again her fiber symptoms seem to be flaring as well.  We did discuss that narcotic pain medication is actually not helpful for fibro.  We could certainly look at adjusting her gabapentin which could be helpful for her fibromyalgia and for her neuropathy.  Will increase dose to 300 mg 3 times daily.

## 2023-08-04 NOTE — Telephone Encounter (Signed)
 Erected prescription and resent.  Sorry it still have the old Sigg on it.  Meds ordered this encounter  Medications   gabapentin (NEURONTIN) 300 MG capsule    Sig: Take 1 capsule (300 mg total) by mouth 3 (three) times daily.    Dispense:  90 capsule    Refill:  1    Please send a replace/new response with 100-Day Supply if appropriate to maximize member benefit. Requesting 1 year supply.

## 2023-08-04 NOTE — Assessment & Plan Note (Addendum)
 She does feel like her symptoms have been worse lately.  I am concerned that her pain seems to have progressed and she also feels like her neuropathy is worsening at the same time that is concerning that she may have some worsening issues in her spine I do think it warrants further investigation.  We discussed getting some up-to-date imaging.  Last MRI of the spine was in 2017 and the last time she had facet injections was in 2019.

## 2023-08-04 NOTE — Assessment & Plan Note (Addendum)
 Indication for chronic opioid: chronic back pain, neuropathy, and fibromyalgia   Medication and dose: oxycodone-acetaminophen 5-325 mg tab # pills per month: 90 Last UDS date: 11/01/22 Opioid Treatment Agreement signed (Y/N): yes Opioid Treatment Agreement last reviewed with patient:  08/04/23 N[facility]SRS reviewed this encounter (include red flags):   Yes- no red flags.  F/U : 3 months

## 2023-08-04 NOTE — Telephone Encounter (Signed)
 Copied from CRM [id number]. Topic: Clinical - Medication Question >> Aug 04, 2023  3:44 PM Brittney F wrote: Reason for CRM:   Caller: Judi Cong From: [facility] is calling in to confirm which directions on the patient's prescription for gabapentin (NEURONTIN) 300 MG capsule need to be followed by the patient.   The prescription sent over list two separate instruction for how to take the medication.   Please give the pharmacy a call and ask for Weston Brass to clarify.   [facility] - [place], [place] - [address] [place] [address] [place] [address] Phone: [phone] Fax: [phone] Hours: Not open 24 hours

## 2023-08-04 NOTE — Assessment & Plan Note (Signed)
Blood pressure looks fantastic today continue current regimen.

## 2023-08-05 NOTE — Telephone Encounter (Signed)
 Called and spoke with pt letting her know that the Rx has been sent to the pharmacy for her. Understanding was verbalized. Nothing further needed.

## 2023-08-08 ENCOUNTER — Telehealth: Payer: Self-pay | Admitting: Family Medicine

## 2023-08-08 LAB — SPECIMEN STATUS REPORT

## 2023-08-08 NOTE — Telephone Encounter (Signed)
 Received notification from the lab that the time and date was not on the specimen sample.  Please call lab and see if we can get that updated so they can still run the sample for pain management

## 2023-08-08 NOTE — Telephone Encounter (Signed)
 Per [facility] - the test/code was discontinued on 08/01/23 and is no longer available. No alternative test is available. The Rep at the time of the call tried calling the division that handles this particular testing and there was no answer. I have provided her my direct contact information. I was informed that they will return a callback at their earliest convenience with a replacement test. Accession #[id number].

## 2023-08-10 NOTE — Telephone Encounter (Signed)
 Will ikely need to recollect

## 2023-08-10 NOTE — Telephone Encounter (Signed)
 Pt notified that she does not need to come back in the office

## 2023-08-16 ENCOUNTER — Telehealth: Payer: Self-pay

## 2023-08-16 NOTE — Telephone Encounter (Signed)
 Copied from CRM [id number]. Topic: General - Other >> Aug 16, 2023  3:37 PM Annette Ramirez H wrote: Reason for CRM: Patient wants to let Dr. Greer Leak know that the change in pain medication helped and she does not want to have the MRI. Says every day counts and she doesn't want to spend a day getting a MRI when she feels like she doesn't need it. Says she doesn't know what it would solve because at her age, it isn't just her back that hurts, but her whole body and that comes with getting older. Also says getting the MRI would be too expensive.

## 2023-08-22 ENCOUNTER — Other Ambulatory Visit

## 2023-08-22 DIAGNOSIS — F17211 Nicotine dependence, cigarettes, in remission: Secondary | ICD-10-CM | POA: Diagnosis not present

## 2023-08-22 DIAGNOSIS — J432 Centrilobular emphysema: Secondary | ICD-10-CM | POA: Diagnosis not present

## 2023-08-22 DIAGNOSIS — Z9981 Dependence on supplemental oxygen: Secondary | ICD-10-CM | POA: Diagnosis not present

## 2023-08-22 DIAGNOSIS — R918 Other nonspecific abnormal finding of lung field: Secondary | ICD-10-CM | POA: Diagnosis not present

## 2023-08-22 DIAGNOSIS — R0902 Hypoxemia: Secondary | ICD-10-CM | POA: Diagnosis not present

## 2023-08-22 DIAGNOSIS — J479 Bronchiectasis, uncomplicated: Secondary | ICD-10-CM | POA: Diagnosis not present

## 2023-08-22 DIAGNOSIS — J449 Chronic obstructive pulmonary disease, unspecified: Secondary | ICD-10-CM | POA: Diagnosis not present

## 2023-08-31 LAB — U13-BUND+SVT
Amphetamines, Urine: NEGATIVE ng/mL
BENZODIAZ UR QL: NEGATIVE ng/mL
Buprenorphine, Urine: NEGATIVE ng/mL
Cocaine (Metab.): NEGATIVE ng/mL
Creatinine, Urine: 136.2 mg/dL (ref 20.0–300.0)
Fentanyl, Urine: NEGATIVE ng/mL
MDMA/MDA, Urine: NEGATIVE ng/mL
Meperidine: NEGATIVE ng/mL
Methadone Screen, Urine: NEGATIVE ng/mL
Nitrite Urine, Quantitative: NEGATIVE ug/mL
PCP Quant, Ur: NEGATIVE ng/mL
Tramadol: NEGATIVE ng/mL
pH, Urine: 5.2 (ref 4.5–8.9)

## 2023-08-31 LAB — SPECIMEN STATUS REPORT

## 2023-09-07 DIAGNOSIS — J438 Other emphysema: Secondary | ICD-10-CM | POA: Diagnosis not present

## 2023-09-07 DIAGNOSIS — H00015 Hordeolum externum left lower eyelid: Secondary | ICD-10-CM | POA: Diagnosis not present

## 2023-09-07 DIAGNOSIS — D0511 Intraductal carcinoma in situ of right breast: Secondary | ICD-10-CM | POA: Diagnosis not present

## 2023-10-03 DIAGNOSIS — I10 Essential (primary) hypertension: Secondary | ICD-10-CM | POA: Diagnosis not present

## 2023-10-03 DIAGNOSIS — Z87891 Personal history of nicotine dependence: Secondary | ICD-10-CM | POA: Diagnosis not present

## 2023-10-03 DIAGNOSIS — Z79899 Other long term (current) drug therapy: Secondary | ICD-10-CM | POA: Diagnosis not present

## 2023-10-03 DIAGNOSIS — R7989 Other specified abnormal findings of blood chemistry: Secondary | ICD-10-CM | POA: Diagnosis not present

## 2023-10-03 DIAGNOSIS — R072 Precordial pain: Secondary | ICD-10-CM | POA: Diagnosis not present

## 2023-10-03 DIAGNOSIS — K219 Gastro-esophageal reflux disease without esophagitis: Secondary | ICD-10-CM | POA: Diagnosis not present

## 2023-10-03 DIAGNOSIS — J90 Pleural effusion, not elsewhere classified: Secondary | ICD-10-CM | POA: Diagnosis not present

## 2023-10-03 DIAGNOSIS — Z880 Allergy status to penicillin: Secondary | ICD-10-CM | POA: Diagnosis not present

## 2023-10-03 DIAGNOSIS — Z885 Allergy status to narcotic agent status: Secondary | ICD-10-CM | POA: Diagnosis not present

## 2023-10-03 DIAGNOSIS — Z8709 Personal history of other diseases of the respiratory system: Secondary | ICD-10-CM | POA: Diagnosis not present

## 2023-10-03 DIAGNOSIS — Z886 Allergy status to analgesic agent status: Secondary | ICD-10-CM | POA: Diagnosis not present

## 2023-10-03 DIAGNOSIS — R9431 Abnormal electrocardiogram [ECG] [EKG]: Secondary | ICD-10-CM | POA: Diagnosis not present

## 2023-10-03 DIAGNOSIS — Z9109 Other allergy status, other than to drugs and biological substances: Secondary | ICD-10-CM | POA: Diagnosis not present

## 2023-10-03 DIAGNOSIS — J441 Chronic obstructive pulmonary disease with (acute) exacerbation: Secondary | ICD-10-CM | POA: Diagnosis not present

## 2023-11-03 ENCOUNTER — Encounter: Payer: Self-pay | Admitting: Family Medicine

## 2023-11-03 ENCOUNTER — Ambulatory Visit (INDEPENDENT_AMBULATORY_CARE_PROVIDER_SITE_OTHER): Admitting: Family Medicine

## 2023-11-03 VITALS — BP 127/70 | HR 79 | Ht 62.0 in | Wt 142.0 lb

## 2023-11-03 DIAGNOSIS — G8929 Other chronic pain: Secondary | ICD-10-CM

## 2023-11-03 DIAGNOSIS — M797 Fibromyalgia: Secondary | ICD-10-CM | POA: Diagnosis not present

## 2023-11-03 DIAGNOSIS — J449 Chronic obstructive pulmonary disease, unspecified: Secondary | ICD-10-CM

## 2023-11-03 DIAGNOSIS — M545 Low back pain, unspecified: Secondary | ICD-10-CM | POA: Diagnosis not present

## 2023-11-03 DIAGNOSIS — J432 Centrilobular emphysema: Secondary | ICD-10-CM | POA: Diagnosis not present

## 2023-11-03 MED ORDER — OXYCODONE-ACETAMINOPHEN 5-325 MG PO TABS: 1.0000 | ORAL_TABLET | Freq: Three times a day (TID) | ORAL | 0 refills | Status: DC | PRN

## 2023-11-03 MED ORDER — GABAPENTIN 300 MG PO CAPS: 300.0000 mg | ORAL_CAPSULE | Freq: Three times a day (TID) | ORAL | 1 refills | Status: DC

## 2023-11-03 NOTE — Patient Instructions (Signed)
 Talk to your lung doc about Pulmonary Rehab

## 2023-11-03 NOTE — Assessment & Plan Note (Signed)
 She is followed by [facility] chest specialist.

## 2023-11-03 NOTE — Assessment & Plan Note (Signed)
 Doing okay with 3 times daily gabapentin .  Her biggest issue right now really is her breathing.

## 2023-11-03 NOTE — Progress Notes (Signed)
 Established Patient Office Visit  Subjective  Patient ID: Annette Ramirez, female    DOB: [birth year]  Age: 78 y.o. MRN: [id number]  Chief Complaint  Patient presents with   Pain Management    HPI  She is here today for follow-up for chronic pain management.  Follow-up chronic pain management for chronic bilateral low back pain and peripheral neuropathy. She is here today for 13-month follow-up. Currently on 5 mg oxycodone  twice a day as well as gabapentin .   She was also seen in the emergency room recently about 4 weeks ago on June 3 for COPD exacerbation.  She had a chest x-ray showing trace bilateral pleural effusions as well as given doxycycline  and methylprednisolone  taper.  We also recently increased her gabapentin  because of increased fibromyalgia flares.  She is taking the medication 2-3 times per day.    ROS    Objective:     BP 127/70   Pulse 79   Ht 5' 2 (1.575 m)   Wt 142 lb 0.3 oz (64.4 kg)   SpO2 97%   BMI 25.98 kg/m    Physical Exam Vitals and nursing note reviewed.  Constitutional:      Appearance: Normal appearance.  HENT:     Head: Normocephalic and atraumatic.  Eyes:     Conjunctiva/sclera: Conjunctivae normal.  Cardiovascular:     Rate and Rhythm: Normal rate and regular rhythm.  Pulmonary:     Effort: Pulmonary effort is normal.     Breath sounds: Normal breath sounds.  Skin:    General: Skin is warm and dry.  Neurological:     Mental Status: She is alert.  Psychiatric:        Mood and Affect: Mood normal.      No results found for any visits on 11/03/23.    The ASCVD Risk score (Arnett DK, et al., 2019) failed to calculate for the following reasons:   The 2019 ASCVD risk score is only valid for ages 51 to 33    Assessment & Plan:   Problem List Items Addressed This Visit       Respiratory   Stage 3 severe COPD by GOLD classification [facility])   She is followed by [facility] chest specialist.      COPD (chronic obstructive  pulmonary disease) with emphysema (HCC)   Coitnue to use flutter valve and Breztri . F/U with pulm next months discuss if she is a candidate for pulm rehab.  She did try the vibrating vest but says that it was too uncomfortable and actually really aggravated her reflux and made her stomach hurt so she quit using it.  Wears oxygen  as needed.  Working on try to get a Insurance underwriter        Other   Fibromyalgia   Doing okay with 3 times daily gabapentin .  Her biggest issue right now really is her breathing.      Relevant Medications   oxyCODONE -acetaminophen  (PERCOCET/ROXICET) 5-325 MG tablet   oxyCODONE -acetaminophen  (PERCOCET/ROXICET) 5-325 MG tablet (Start on 12/01/2023)   oxyCODONE -acetaminophen  (PERCOCET/ROXICET) 5-325 MG tablet (Start on 12/29/2023)   gabapentin  (NEURONTIN ) 300 MG capsule   Encounter for chronic pain management - Primary   Indication for chronic opioid: chronic back pain, neuropathy, and fibromyalgia   Medication and dose: oxycodone -acetaminophen  5-325 mg tab # pills per month: 90 Last UDS date: 08/04/23 Opioid Treatment Agreement signed (Y/N): yes Opioid Treatment Agreement last reviewed with patient:  08/04/23 NCCSRS reviewed this encounter (include red flags):   Yes-  no red flags.  F/U : 3 months            Relevant Medications   oxyCODONE -acetaminophen  (PERCOCET/ROXICET) 5-325 MG tablet   oxyCODONE -acetaminophen  (PERCOCET/ROXICET) 5-325 MG tablet (Start on 12/01/2023)   oxyCODONE -acetaminophen  (PERCOCET/ROXICET) 5-325 MG tablet (Start on 12/29/2023)   Chronic lower back pain   Relevant Medications   oxyCODONE -acetaminophen  (PERCOCET/ROXICET) 5-325 MG tablet   oxyCODONE -acetaminophen  (PERCOCET/ROXICET) 5-325 MG tablet (Start on 12/01/2023)   oxyCODONE -acetaminophen  (PERCOCET/ROXICET) 5-325 MG tablet (Start on 12/29/2023)   gabapentin  (NEURONTIN ) 300 MG capsule    Return in about 3 months (around 02/03/2024) for Chronic Pain Mgt.    Dorothyann Byars, MD

## 2023-11-03 NOTE — Assessment & Plan Note (Addendum)
 Coitnue to use flutter valve and Breztri . F/U with pulm next months discuss if she is a candidate for pulm rehab.  She did try the vibrating vest but says that it was too uncomfortable and actually really aggravated her reflux and made her stomach hurt so she quit using it.  Wears oxygen  as needed.  Working on try to get a Insurance underwriter

## 2023-11-03 NOTE — Assessment & Plan Note (Signed)
 Indication for chronic opioid: chronic back pain, neuropathy, and fibromyalgia   Medication and dose: oxycodone -acetaminophen  5-325 mg tab # pills per month: 90 Last UDS date: 08/04/23 Opioid Treatment Agreement signed (Y/N): yes Opioid Treatment Agreement last reviewed with patient:  08/04/23 NCCSRS reviewed this encounter (include red flags):   Yes- no red flags.  F/U : 3 months

## 2023-11-24 ENCOUNTER — Other Ambulatory Visit: Payer: Self-pay | Admitting: Family Medicine

## 2023-11-24 DIAGNOSIS — K219 Gastro-esophageal reflux disease without esophagitis: Secondary | ICD-10-CM

## 2023-12-01 DIAGNOSIS — F17211 Nicotine dependence, cigarettes, in remission: Secondary | ICD-10-CM | POA: Diagnosis not present

## 2023-12-01 DIAGNOSIS — J449 Chronic obstructive pulmonary disease, unspecified: Secondary | ICD-10-CM | POA: Diagnosis not present

## 2023-12-01 DIAGNOSIS — R918 Other nonspecific abnormal finding of lung field: Secondary | ICD-10-CM | POA: Diagnosis not present

## 2023-12-01 DIAGNOSIS — J470 Bronchiectasis with acute lower respiratory infection: Secondary | ICD-10-CM | POA: Diagnosis not present

## 2023-12-01 DIAGNOSIS — J432 Centrilobular emphysema: Secondary | ICD-10-CM | POA: Diagnosis not present

## 2023-12-01 DIAGNOSIS — Z9981 Dependence on supplemental oxygen: Secondary | ICD-10-CM | POA: Diagnosis not present

## 2023-12-01 DIAGNOSIS — R0902 Hypoxemia: Secondary | ICD-10-CM | POA: Diagnosis not present

## 2023-12-02 DIAGNOSIS — J449 Chronic obstructive pulmonary disease, unspecified: Secondary | ICD-10-CM | POA: Diagnosis not present

## 2023-12-02 DIAGNOSIS — H6123 Impacted cerumen, bilateral: Secondary | ICD-10-CM | POA: Diagnosis not present

## 2023-12-02 DIAGNOSIS — H61303 Acquired stenosis of external ear canal, unspecified, bilateral: Secondary | ICD-10-CM | POA: Diagnosis not present

## 2023-12-12 DIAGNOSIS — J479 Bronchiectasis, uncomplicated: Secondary | ICD-10-CM | POA: Diagnosis not present

## 2023-12-12 DIAGNOSIS — R0902 Hypoxemia: Secondary | ICD-10-CM | POA: Diagnosis not present

## 2023-12-12 DIAGNOSIS — J432 Centrilobular emphysema: Secondary | ICD-10-CM | POA: Diagnosis not present

## 2023-12-12 DIAGNOSIS — J449 Chronic obstructive pulmonary disease, unspecified: Secondary | ICD-10-CM | POA: Diagnosis not present

## 2023-12-12 DIAGNOSIS — Z9981 Dependence on supplemental oxygen: Secondary | ICD-10-CM | POA: Diagnosis not present

## 2023-12-12 DIAGNOSIS — F17211 Nicotine dependence, cigarettes, in remission: Secondary | ICD-10-CM | POA: Diagnosis not present

## 2023-12-12 DIAGNOSIS — R918 Other nonspecific abnormal finding of lung field: Secondary | ICD-10-CM | POA: Diagnosis not present

## 2023-12-16 DIAGNOSIS — R918 Other nonspecific abnormal finding of lung field: Secondary | ICD-10-CM | POA: Diagnosis not present

## 2023-12-16 DIAGNOSIS — I2510 Atherosclerotic heart disease of native coronary artery without angina pectoris: Secondary | ICD-10-CM | POA: Diagnosis not present

## 2023-12-16 DIAGNOSIS — J479 Bronchiectasis, uncomplicated: Secondary | ICD-10-CM | POA: Diagnosis not present

## 2023-12-16 DIAGNOSIS — J439 Emphysema, unspecified: Secondary | ICD-10-CM | POA: Diagnosis not present

## 2024-01-07 ENCOUNTER — Other Ambulatory Visit: Payer: Self-pay | Admitting: Family Medicine

## 2024-01-07 DIAGNOSIS — M797 Fibromyalgia: Secondary | ICD-10-CM

## 2024-01-07 DIAGNOSIS — F5101 Primary insomnia: Secondary | ICD-10-CM

## 2024-01-10 DIAGNOSIS — F17211 Nicotine dependence, cigarettes, in remission: Secondary | ICD-10-CM | POA: Diagnosis not present

## 2024-01-10 DIAGNOSIS — R918 Other nonspecific abnormal finding of lung field: Secondary | ICD-10-CM | POA: Diagnosis not present

## 2024-01-10 DIAGNOSIS — J432 Centrilobular emphysema: Secondary | ICD-10-CM | POA: Diagnosis not present

## 2024-01-10 DIAGNOSIS — J441 Chronic obstructive pulmonary disease with (acute) exacerbation: Secondary | ICD-10-CM | POA: Diagnosis not present

## 2024-01-10 DIAGNOSIS — R0602 Shortness of breath: Secondary | ICD-10-CM | POA: Diagnosis not present

## 2024-01-10 DIAGNOSIS — J479 Bronchiectasis, uncomplicated: Secondary | ICD-10-CM | POA: Diagnosis not present

## 2024-01-10 DIAGNOSIS — Z9981 Dependence on supplemental oxygen: Secondary | ICD-10-CM | POA: Diagnosis not present

## 2024-01-10 DIAGNOSIS — R0902 Hypoxemia: Secondary | ICD-10-CM | POA: Diagnosis not present

## 2024-02-03 DIAGNOSIS — R079 Chest pain, unspecified: Secondary | ICD-10-CM | POA: Diagnosis not present

## 2024-02-03 DIAGNOSIS — M1990 Unspecified osteoarthritis, unspecified site: Secondary | ICD-10-CM | POA: Diagnosis not present

## 2024-02-03 DIAGNOSIS — Z87891 Personal history of nicotine dependence: Secondary | ICD-10-CM | POA: Diagnosis not present

## 2024-02-03 DIAGNOSIS — R072 Precordial pain: Secondary | ICD-10-CM | POA: Diagnosis not present

## 2024-02-03 DIAGNOSIS — I959 Hypotension, unspecified: Secondary | ICD-10-CM | POA: Diagnosis not present

## 2024-02-03 DIAGNOSIS — Z86000 Personal history of in-situ neoplasm of breast: Secondary | ICD-10-CM | POA: Diagnosis not present

## 2024-02-03 DIAGNOSIS — J449 Chronic obstructive pulmonary disease, unspecified: Secondary | ICD-10-CM | POA: Diagnosis not present

## 2024-02-03 DIAGNOSIS — R531 Weakness: Secondary | ICD-10-CM | POA: Diagnosis not present

## 2024-02-03 DIAGNOSIS — I951 Orthostatic hypotension: Secondary | ICD-10-CM | POA: Diagnosis not present

## 2024-02-03 DIAGNOSIS — R9431 Abnormal electrocardiogram [ECG] [EKG]: Secondary | ICD-10-CM | POA: Diagnosis not present

## 2024-02-03 DIAGNOSIS — Z860100 Personal history of colon polyps, unspecified: Secondary | ICD-10-CM | POA: Diagnosis not present

## 2024-02-03 DIAGNOSIS — R42 Dizziness and giddiness: Secondary | ICD-10-CM | POA: Diagnosis not present

## 2024-02-03 DIAGNOSIS — I10 Essential (primary) hypertension: Secondary | ICD-10-CM | POA: Diagnosis not present

## 2024-02-03 DIAGNOSIS — E7800 Pure hypercholesterolemia, unspecified: Secondary | ICD-10-CM | POA: Diagnosis not present

## 2024-02-03 DIAGNOSIS — J4489 Other specified chronic obstructive pulmonary disease: Secondary | ICD-10-CM | POA: Diagnosis not present

## 2024-02-03 DIAGNOSIS — K219 Gastro-esophageal reflux disease without esophagitis: Secondary | ICD-10-CM | POA: Diagnosis not present

## 2024-02-03 DIAGNOSIS — Z79899 Other long term (current) drug therapy: Secondary | ICD-10-CM | POA: Diagnosis not present

## 2024-02-06 ENCOUNTER — Ambulatory Visit: Admitting: Family Medicine

## 2024-02-06 ENCOUNTER — Encounter: Payer: Self-pay | Admitting: Family Medicine

## 2024-02-06 VITALS — BP 130/77 | HR 89 | Ht 62.0 in | Wt 142.0 lb

## 2024-02-06 DIAGNOSIS — M545 Low back pain, unspecified: Secondary | ICD-10-CM

## 2024-02-06 DIAGNOSIS — I890 Lymphedema, not elsewhere classified: Secondary | ICD-10-CM | POA: Diagnosis not present

## 2024-02-06 DIAGNOSIS — J9601 Acute respiratory failure with hypoxia: Secondary | ICD-10-CM | POA: Diagnosis not present

## 2024-02-06 DIAGNOSIS — I10 Essential (primary) hypertension: Secondary | ICD-10-CM

## 2024-02-06 DIAGNOSIS — Z23 Encounter for immunization: Secondary | ICD-10-CM | POA: Diagnosis not present

## 2024-02-06 DIAGNOSIS — G8929 Other chronic pain: Secondary | ICD-10-CM

## 2024-02-06 MED ORDER — OXYCODONE-ACETAMINOPHEN 5-325 MG PO TABS: 1.0000 | ORAL_TABLET | Freq: Three times a day (TID) | ORAL | 0 refills | Status: AC | PRN

## 2024-02-06 NOTE — Patient Instructions (Signed)
 Let me know if you want referral to Lymphedema clinic

## 2024-02-06 NOTE — Addendum Note (Signed)
 Addended by: Arynn Armand D on: 02/06/2024 04:22 PM   Modules accepted: Orders

## 2024-02-06 NOTE — Progress Notes (Addendum)
 Established Patient Office Visit  Subjective  Patient ID: Annette Ramirez, female    DOB: [birth year]  Age: 78 y.o. MRN: [id number]  Chief Complaint  Patient presents with   Medication Refill    HPI 3 mo f/u Pain Mgt - chronic pain management for chronic bilateral low back pain and peripheral neuropathy. She is here today for 19-month follow-up. Currently on 5 mg oxycodone  twice a day as well as gabapentin .   F/U COPD - now on 3 Liter.    Discussed the use of AI scribe software for clinical note transcription with the patient, who gave verbal consent to proceed.  History of Present Illness Annette Ramirez is an 78 year old female who presents with shortness of breath and recent hospitalization.  Dyspnea and chest discomfort - Shortness of breath with sensation of heaviness - Oxygen  saturation stable at 93% - Tightness and heaviness in chest, extending from armpit to armpit, described as feeling like a swollen finger - Symptoms contribute to overall discomfort - Sleeps with oxygen  on  Recent hospitalization and cardiovascular symptoms - Recent hospitalization for severe headache and feeling 'out of sorts', similar to prior episode of hypotension - During episode, heart rate dropped to 52 (baseline 100-110) - Blood pressure elevated to 188/90 during episode (was 109/60 earlier that morning) - CT head, EKG, and cardiac enzyme tests performed during hospitalization - Expresses concern that heart and lungs were not auscultated during hospital stay - she is interested in Palliative [facility]  Gastrointestinal symptoms - Severe gas pain in upper abdomen and chest at night, causing awakening from sleep - Pain radiates from waist up to jaws and head, resembling heart attack symptoms - Takes daily heartburn medication - Finds relief with Cashew within 30 minutes - Recent episode of vomiting over the weekend  Headache - Severe headache during recent hospitalization - Persistent headache  over the weekend, now resolved  Lymphedema - Worsening lymphedema with increased tightness and heaviness in chest     ROS    Objective:     BP 130/77 (BP Location: Left Arm, Patient Position: Sitting, Cuff Size: Normal)   Pulse 89   Ht 5' 2 (1.575 m)   Wt 142 lb (64.4 kg)   SpO2 95%   BMI 25.97 kg/m    Physical Exam Vitals and nursing note reviewed.  Constitutional:      Appearance: Normal appearance.  HENT:     Head: Normocephalic and atraumatic.  Eyes:     Conjunctiva/sclera: Conjunctivae normal.  Cardiovascular:     Rate and Rhythm: Normal rate and regular rhythm.  Pulmonary:     Effort: Pulmonary effort is normal.     Breath sounds: Normal breath sounds.  Skin:    General: Skin is warm and dry.  Neurological:     Mental Status: She is alert.  Psychiatric:        Mood and Affect: Mood normal.      No results found for any visits on 02/06/24.    The ASCVD Risk score (Arnett DK, et al., 2019) failed to calculate for the following reasons:   The 2019 ASCVD risk score is only valid for ages 31 to 85    Assessment & Plan:   Problem List Items Addressed This Visit       Cardiovascular and Mediastinum   HYPERTENSION, BENIGN ESSENTIAL   BP looks great today.   Significant blood pressure fluctuation noted. Recent hospitalization for headache and low heart rate.  - Review recent  hospitalization blood work for necessary repeats. - Continue blood pressure monitoring.        Respiratory   Acute respiratory failure with hypoxia (HCC)   On 3 Liters. No recent flares. Wears her oxygen  at night.       Relevant Orders   Amb Referral to Palliative Care     Other   Encounter for chronic pain management - Primary   Indication for chronic opioid: chronic back pain, neuropathy, and fibromyalgia   Medication and dose: oxycodone -acetaminophen  5-325 mg tab # pills per month: 90 Last UDS date: 02/04/24 Opioid Treatment Agreement signed (Y/N): yes Opioid  Treatment Agreement last reviewed with patient:  08/04/23 NCCSRS reviewed this encounter (include red flags):   Yes- no red flags.  F/U : 3 months          Relevant Medications   oxyCODONE -acetaminophen  (PERCOCET/ROXICET) 5-325 MG tablet   oxyCODONE -acetaminophen  (PERCOCET/ROXICET) 5-325 MG tablet (Start on 03/05/2024)   oxyCODONE -acetaminophen  (PERCOCET/ROXICET) 5-325 MG tablet (Start on 04/02/2024)   Other Relevant Orders   Drug Screen, Ur (12+Oxycodone +Crt)   Chronic lower back pain   Relevant Medications   oxyCODONE -acetaminophen  (PERCOCET/ROXICET) 5-325 MG tablet   oxyCODONE -acetaminophen  (PERCOCET/ROXICET) 5-325 MG tablet (Start on 03/05/2024)   oxyCODONE -acetaminophen  (PERCOCET/ROXICET) 5-325 MG tablet (Start on 04/02/2024)   Other Visit Diagnoses       Immunization due       Relevant Orders   Flu vaccine HIGH DOSE PF(Fluzone Trivalent) (Completed)     Lymphedema           Assessment and Plan Assessment & Plan Chronic respiratory insufficiency with hypoxia Oxygen  saturation at 93%. Managed post-hospitalization. Scheduled for cardiology evaluation. - Refer to palliative care for chronic condition management. - Continue nebulizer use twice daily. - Follow up with cardiologist on Wednesday.  Lymphedema of chest wall s/p breast cancer  Worsening lymphedema causing tightness and heaviness. Focus on lung issues currently. - Consider referral to lymphedema clinic post-cardiology evaluation if symptoms persist.  General Health Maintenance Discussed palliative care for chronic condition management and life planning. - Refer to [facility] for palliative care services.    Return in about 3 months (around 05/08/2024) for Chronic Pain Mgt.    Dorothyann Byars, MD

## 2024-02-06 NOTE — Assessment & Plan Note (Signed)
 Indication for chronic opioid: chronic back pain, neuropathy, and fibromyalgia   Medication and dose: oxycodone -acetaminophen  5-325 mg tab # pills per month: 90 Last UDS date: 02/04/24 Opioid Treatment Agreement signed (Y/N): yes Opioid Treatment Agreement last reviewed with patient:  08/04/23 NCCSRS reviewed this encounter (include red flags):   Yes- no red flags.  F/U : 3 months

## 2024-02-06 NOTE — Assessment & Plan Note (Signed)
 BP looks great today.   Significant blood pressure fluctuation noted. Recent hospitalization for headache and low heart rate.  - Review recent hospitalization blood work for necessary repeats. - Continue blood pressure monitoring.

## 2024-02-06 NOTE — Assessment & Plan Note (Addendum)
 On 3 Liters. No recent flares. Wears her oxygen  at night.

## 2024-02-08 DIAGNOSIS — R001 Bradycardia, unspecified: Secondary | ICD-10-CM | POA: Diagnosis not present

## 2024-02-08 DIAGNOSIS — R5383 Other fatigue: Secondary | ICD-10-CM | POA: Diagnosis not present

## 2024-02-08 DIAGNOSIS — J438 Other emphysema: Secondary | ICD-10-CM | POA: Diagnosis not present

## 2024-02-08 DIAGNOSIS — I10 Essential (primary) hypertension: Secondary | ICD-10-CM | POA: Diagnosis not present

## 2024-02-12 LAB — DRUG SCREEN, UR (12+OXYCODONE+CRT)
Amphetamine Scrn, Ur: NEGATIVE ng/mL
BARBITURATE SCREEN URINE: NEGATIVE ng/mL
BENZODIAZEPINE SCREEN, URINE: NEGATIVE ng/mL
CANNABINOIDS UR QL SCN: NEGATIVE ng/mL
Cocaine (Metab) Scrn, Ur: NEGATIVE ng/mL
Creatinine(Crt), U: 206.9 mg/dL (ref 20.0–300.0)
Fentanyl, Urine: NEGATIVE pg/mL
Meperidine Screen, Urine: NEGATIVE ng/mL
Methadone Screen, Urine: NEGATIVE ng/mL
OXYCODONE+OXYMORPHONE UR QL SCN: POSITIVE — AB
Opiate Scrn, Ur: NEGATIVE ng/mL
Ph of Urine: 5.5 (ref 4.5–8.9)
Phencyclidine Qn, Ur: NEGATIVE ng/mL
Propoxyphene Scrn, Ur: NEGATIVE ng/mL
SPECIFIC GRAVITY: 1.015
Tramadol Screen, Urine: NEGATIVE ng/mL

## 2024-02-13 ENCOUNTER — Ambulatory Visit: Payer: Self-pay | Admitting: Family Medicine

## 2024-02-13 NOTE — Progress Notes (Signed)
 Your lab work is within acceptable range and there are no concerning findings.   ?

## 2024-02-15 DIAGNOSIS — R001 Bradycardia, unspecified: Secondary | ICD-10-CM | POA: Diagnosis not present

## 2024-02-15 DIAGNOSIS — R5383 Other fatigue: Secondary | ICD-10-CM | POA: Diagnosis not present

## 2024-02-15 NOTE — Progress Notes (Signed)
 Patient informed.

## 2024-02-16 DIAGNOSIS — R0902 Hypoxemia: Secondary | ICD-10-CM | POA: Diagnosis not present

## 2024-02-16 DIAGNOSIS — F17211 Nicotine dependence, cigarettes, in remission: Secondary | ICD-10-CM | POA: Diagnosis not present

## 2024-02-16 DIAGNOSIS — J441 Chronic obstructive pulmonary disease with (acute) exacerbation: Secondary | ICD-10-CM | POA: Diagnosis not present

## 2024-02-16 DIAGNOSIS — J471 Bronchiectasis with (acute) exacerbation: Secondary | ICD-10-CM | POA: Diagnosis not present

## 2024-02-16 DIAGNOSIS — J432 Centrilobular emphysema: Secondary | ICD-10-CM | POA: Diagnosis not present

## 2024-02-16 DIAGNOSIS — R918 Other nonspecific abnormal finding of lung field: Secondary | ICD-10-CM | POA: Diagnosis not present

## 2024-02-16 DIAGNOSIS — Z9981 Dependence on supplemental oxygen: Secondary | ICD-10-CM | POA: Diagnosis not present

## 2024-02-22 ENCOUNTER — Other Ambulatory Visit: Payer: Self-pay | Admitting: Family Medicine

## 2024-04-12 ENCOUNTER — Ambulatory Visit (INDEPENDENT_AMBULATORY_CARE_PROVIDER_SITE_OTHER)

## 2024-04-12 VITALS — BP 131/66 | HR 85 | Ht 62.0 in | Wt 146.0 lb

## 2024-04-12 DIAGNOSIS — Z0000 Encounter for general adult medical examination without abnormal findings: Secondary | ICD-10-CM

## 2024-04-12 NOTE — Progress Notes (Signed)
 Chief Complaint  Patient presents with   Medicare Wellness     Subjective:   Annette Ramirez is a 78 y.o. female who presents for a Medicare Annual Wellness Visit.  Visit info / Clinical Intake: Medicare Wellness Visit Type:: Subsequent Annual Wellness Visit Persons participating in visit and providing information:: patient Medicare Wellness Visit Mode:: In-person (required for WTM) Interpreter Needed?: No Pre-visit prep was completed: yes AWV questionnaire completed by patient prior to visit?: no Living arrangements:: lives with spouse/significant other Patient's Overall Health Status Rating: (!) poor Typical amount of pain: some Does pain affect daily life?: (!) yes Are you currently prescribed opioids?: (!) yes  Dietary Habits and Nutritional Risks How many meals a day?: 3 Eats fruit and vegetables daily?: yes Most meals are obtained by: preparing own meals In the last 2 weeks, have you had any of the following?: none Diabetic:: no  Functional Status Activities of Daily Living (to include ambulation/medication): Independent Ambulation: Independent Medication Administration: Independent Home Management (perform basic housework or laundry): Needs assistance (comment) (She has a friend help her with housework.) Manage your own finances?: yes Primary transportation is: family / friends Concerns about vision?: no *vision screening is required for WTM* Concerns about hearing?: no  Fall Screening Falls in the past year?: 0 Number of falls in past year: 0 Was there an injury with Fall?: 0 Fall Risk Category Calculator: 0 Patient Fall Risk Level: Low Fall Risk  Fall Risk Patient at Risk for Falls Due to: No Fall Risks Fall risk Follow up: Falls evaluation completed  Home and Transportation Safety: All rugs have non-skid backing?: N/A, no rugs All stairs or steps have railings?: yes Grab bars in the bathtub or shower?: yes Have non-skid surface in bathtub or  shower?: yes Good home lighting?: yes Regular seat belt use?: yes Hospital stays in the last year:: no  Cognitive Assessment Difficulty concentrating, remembering, or making decisions? : yes Will 6CIT or Mini Cog be Completed: yes What year is it?: 0 points What month is it?: 0 points Give patient an address phrase to remember (5 components): [address] [place], [place] [address] About what time is it?: 0 points Count backwards from 20 to 1: 0 points Say the months of the year in reverse: 0 points Repeat the address phrase from earlier: 0 points 6 CIT Score: 0 points  Advance Directives (For Healthcare) Does Patient Have a Medical Advance Directive?: Yes Does patient want to make changes to medical advance directive?: No - Patient declined Type of Advance Directive: Healthcare Power of [place]; Living will Copy of Healthcare Power of Attorney in Chart?: No - copy requested Copy of Living Will in Chart?: No - copy requested  Reviewed/Updated  Reviewed/Updated: Reviewed All (Medical, Surgical, Family, Medications, Allergies, Care Teams, Patient Goals)    Allergies (verified) Amoxicillin-pot clavulanate, Aspirin, Codeine, Hctz [hydrochlorothiazide ], Nsaids, and Other   Current Medications (verified) Outpatient Encounter Medications as of 04/12/2024  Medication Sig   albuterol  (VENTOLIN  HFA) 108 (90 Base) MCG/ACT inhaler Inhale 2 puffs into the lungs every 6 (six) hours as needed for wheezing.   Budeson-Glycopyrrol-Formoterol (BREZTRI  AEROSPHERE) 160-9-4.8 MCG/ACT AERO Inhale 2 puffs into the lungs in the morning and at bedtime.   gabapentin  (NEURONTIN ) 300 MG capsule Take 1 capsule (300 mg total) by mouth 3 (three) times daily.   ipratropium-albuterol  (DUONEB) 0.5-2.5 (3) MG/3ML SOLN Take 3 mLs by nebulization every 4 (four) hours as needed. States taking twice a day   LORazepam (ATIVAN) 0.5 MG  tablet Take 0.5 mg by mouth at bedtime.   losartan  (COZAAR ) 25 MG tablet Take 0.5  tablets (12.5 mg total) by mouth daily.   MAGNESIUM PO Take 1 tablet by mouth daily. 250mg  daily   omeprazole  (PRILOSEC) 40 MG capsule TAKE ONE CAPSULE BY MOUTH DAILY   oxybutynin  (DITROPAN -XL) 10 MG 24 hr tablet Take 1 tablet (10 mg total) by mouth at bedtime.   oxyCODONE -acetaminophen  (PERCOCET/ROXICET) 5-325 MG tablet Take 1 tablet by mouth every 8 (eight) hours as needed for severe pain (pain score 7-10).   oxyCODONE -acetaminophen  (PERCOCET/ROXICET) 5-325 MG tablet Take 1 tablet by mouth every 8 (eight) hours as needed for severe pain (pain score 7-10).   oxyCODONE -acetaminophen  (PERCOCET/ROXICET) 5-325 MG tablet Take 1 tablet by mouth every 8 (eight) hours as needed for severe pain (pain score 7-10).   QUEtiapine  (SEROQUEL ) 50 MG tablet Take 3 tablets (150 mg) by mouth at bedtime.   atorvastatin  (LIPITOR) 20 MG tablet TAKE 1 TABLET BY MOUTH ONCE  DAILY (Patient not taking: Reported on 04/12/2024)   No facility-administered encounter medications on file as of 04/12/2024.    History: Past Medical History:  Diagnosis Date   Anxiety    Asthma    COPD (chronic obstructive pulmonary disease) (HCC)    Depression    Ductal carcinoma in situ (DCIS) of right breast 08/16/2019   Exertional shortness of breath    Family history of anesthesia complication    granddaughter has PONV (02/28/2013)   Fibromyalgia    GERD (gastroesophageal reflux disease)    H/O hiatal hernia    Hypertension    Migraines    in the past; they've stopped (02/28/2013)   Opiate use 06/06/2017   Osteoporosis    PONV (postoperative nausea and vomiting)    Sinus headache    Past Surgical History:  Procedure Laterality Date   ABDOMINAL HYSTERECTOMY  05/03/1973   APPENDECTOMY  05/03/1973   BREAST CYST ASPIRATION Left 01/02/1979   COLONOSCOPY W/ BIOPSIES AND POLYPECTOMY  05/03/2010   DILATION AND CURETTAGE OF UTERUS  01/02/1959   EXCISIONAL HEMORRHOIDECTOMY  01/02/1979   LUMBAR LAMINECTOMY  02/28/2013   LUMBAR  LAMINECTOMY/DECOMPRESSION MICRODISCECTOMY N/A 02/28/2013   Procedure: LUMBAR LAMINECTOMY/DECOMPRESSION MICRODISCECTOMY/Lumbar 4-5 decompression;  Surgeon: Oneil Rodgers Priestly, MD;  Location: [facility] OR;  Service: Orthopedics;  Laterality: N/A;  Lumbar 4-5 decompression   MASTECTOMY Left 01/2021   MASTECTOMY Right 08/21/2019   TONSILLECTOMY  05/03/1948   TUBAL LIGATION  05/03/1970   Family History  Problem Relation Age of Onset   Alzheimer's disease Mother    Prostate cancer Other        grandfather   Stroke Other        grandparent   Social History   Occupational History   Occupation: worked for pensions consultant    Comment: retired  Tobacco Use   Smoking status: Former    Current packs/day: 0.00    Average packs/day: 0.3 packs/day for 40.0 years (10.0 ttl pk-yrs)    Types: Cigarettes    Start date: 02/01/1980    Quit date: 02/01/2020    Years since quitting: 4.1    Passive exposure: Never   Smokeless tobacco: Former  Building Services Engineer status: Never Used  Substance and Sexual Activity   Alcohol use: Not Currently    Alcohol/week: 3.0 standard drinks of alcohol    Types: 3 Glasses of wine per week    Comment: occasionally social   Drug use: No   Sexual activity: Not Currently  Tobacco Counseling Counseling given: Not Answered  SDOH Screenings   Food Insecurity: No Food Insecurity (02/07/2024)   [facility]  Housing: Low Risk (04/12/2024)  Transportation Needs: No Transportation Needs (04/12/2024)  Utilities: Not At Risk (04/12/2024)  Alcohol Screen: Low Risk (04/11/2023)  Depression (PHQ2-9): Low Risk (04/12/2024)  Financial Resource Strain: Low Risk (02/07/2024)   Received from [facility]  Physical Activity: Inactive (04/12/2024)  Social Connections: Moderately Isolated (04/12/2024)  Stress: Stress Concern Present (04/12/2024)  Tobacco Use: Medium Risk (04/12/2024)  Health Literacy: Adequate Health Literacy (04/12/2024)   See flowsheets for full  screening details  Depression Screen PHQ 2 & 9 Depression Scale- Over the past 2 weeks, how often have you been bothered by any of the following problems? Little interest or pleasure in doing things: 0 Feeling down, depressed, or hopeless (PHQ Adolescent also includes...irritable): 1 PHQ-2 Total Score: 1 Trouble falling or staying asleep, or sleeping too much: 1 Feeling tired or having little energy: 1 Poor appetite or overeating (PHQ Adolescent also includes...weight loss): 0 Feeling bad about yourself - or that you are a failure or have let yourself or your family down: 0 Trouble concentrating on things, such as reading the newspaper or watching television (PHQ Adolescent also includes...like school work): 0 Moving or speaking so slowly that other people could have noticed. Or the opposite - being so fidgety or restless that you have been moving around a lot more than usual: 0 Thoughts that you would be better off dead, or of hurting yourself in some way: 0 PHQ-9 Total Score: 4 If you checked off any problems, how difficult have these problems made it for you to do your work, take care of things at home, or get along with other people?: Somewhat difficult  Depression Treatment Depression Interventions/Treatment : Currently on Treatment     Goals Addressed             This Visit's Progress    Patient Stated       Patient states she would like to be more active.              Objective:    Today's Vitals   04/12/24 1430 04/12/24 1511  BP: (!) 140/70 131/66  Pulse: 85   SpO2: 96%   Weight: 146 lb (66.2 kg)   Height: 5' 2 (1.575 m)    Body mass index is 26.7 kg/m.  Hearing/Vision screen No results found. Immunizations and Health Maintenance Health Maintenance  Topic Date Due   Zoster Vaccines- Shingrix (1 of 2) 07/25/1962   COVID-19 Vaccine (3 - Pfizer risk series) 08/13/2019   Colonoscopy  05/22/2024   Medicare Annual Wellness (AWV)  04/12/2025   DTaP/Tdap/Td  (3 - Td or Tdap) 05/12/2025   Pneumococcal Vaccine: 50+ Years  Completed   Influenza Vaccine  Completed   Meningococcal B Vaccine  Aged Out   Mammogram  Discontinued   Hepatitis C Screening  Discontinued        Assessment/Plan:  This is a routine wellness examination for Annette Ramirez.  Patient Care Team: Alvan Dorothyann BIRCH, MD as PCP - General (Family Medicine) Beverley Mallick as Referring Physician (Internal Medicine) Berry Debby RAMAN, PA-C (Oncology)  I have personally reviewed and noted the following in the patients chart:   Medical and social history Use of alcohol, tobacco or illicit drugs  Current medications and supplements including opioid prescriptions. Functional ability and status Nutritional status Physical activity Advanced directives List of other physicians Hospitalizations, surgeries, and  ER visits in previous 12 months Vitals Screenings to include cognitive, depression, and falls Referrals and appointments  No orders of the defined types were placed in this encounter.  In addition, I have reviewed and discussed with patient certain preventive protocols, quality metrics, and best practice recommendations. A written personalized care plan for preventive services as well as general preventive health recommendations were provided to patient.   Bonny Jon Mayor, CMA   04/12/2024   Return in 1 year (on 04/12/2025).  After Visit Summary: (In Person-Printed) AVS printed and given to the patient  Nurse Notes:   Annette Ramirez is a 78 y.o. female patient of Dorothyann Byars, MD who had a Medicare Annual Wellness Visit today. She reports that she is socially active and does interact with friends/family regularly. She is minimally physically active.

## 2024-04-12 NOTE — Patient Instructions (Signed)
 [name],  Thank you for taking the time for your Medicare Wellness Visit. I appreciate your continued commitment to your health goals. Please review the care plan we discussed, and feel free to reach out if I can assist you further.  Please note that Annual Wellness Visits do not include a physical exam. Some assessments may be limited, especially if the visit was conducted virtually. If needed, we may recommend an in-person follow-up with your provider.  Ongoing Care Seeing your primary care provider every 3 to 6 months helps us  monitor your health and provide consistent, personalized care.   Referrals If a referral was made during today's visit and you haven't received any updates within two weeks, please contact the referred provider directly to check on the status.  Recommended Screenings:  Health Maintenance  Topic Date Due   Zoster (Shingles) Vaccine (1 of 2) 07/25/1964   COVID-19 Vaccine (3 - Pfizer risk series) 08/13/2019   Medicare Annual Wellness Visit  04/10/2024   Colon Cancer Screening  05/22/2024   DTaP/Tdap/Td vaccine (3 - Td or Tdap) 05/12/2025   Pneumococcal Vaccine for age over 20  Completed   Flu Shot  Completed   Meningitis B Vaccine  Aged Out   Breast Cancer Screening  Discontinued   Hepatitis C Screening  Discontinued       04/12/2024    2:51 PM  Advanced Directives  Does Patient Have a Medical Advance Directive? Yes  Type of Estate Agent of [place];Living will  Does patient want to make changes to medical advance directive? No - Patient declined  Copy of Healthcare Power of Attorney in Chart? No - copy requested    Vision: Annual vision screenings are recommended for early detection of glaucoma, cataracts, and diabetic retinopathy. These exams can also reveal signs of chronic conditions such as diabetes and high blood pressure.  Dental: Annual dental screenings help detect early signs of oral cancer, gum disease, and other  conditions linked to overall health, including heart disease and diabetes.  Please see the attached documents for additional preventive care recommendations.

## 2024-05-08 ENCOUNTER — Ambulatory Visit: Admitting: Family Medicine

## 2024-05-20 ENCOUNTER — Other Ambulatory Visit: Payer: Self-pay | Admitting: Family Medicine

## 2024-05-20 DIAGNOSIS — M797 Fibromyalgia: Secondary | ICD-10-CM

## 2025-04-16 ENCOUNTER — Ambulatory Visit
